# Patient Record
Sex: Male | Born: 1940 | Race: White | Hispanic: No | Marital: Married | Smoking: Former smoker
Health system: Southern US, Community
[De-identification: ages and names within clinical notes are randomized; demographics above are authoritative.]

## PROBLEM LIST (undated history)

## (undated) DIAGNOSIS — I219 Acute myocardial infarction, unspecified: Secondary | ICD-10-CM

## (undated) DIAGNOSIS — K219 Gastro-esophageal reflux disease without esophagitis: Secondary | ICD-10-CM

## (undated) DIAGNOSIS — I2510 Atherosclerotic heart disease of native coronary artery without angina pectoris: Secondary | ICD-10-CM

## (undated) DIAGNOSIS — I10 Essential (primary) hypertension: Secondary | ICD-10-CM

## (undated) DIAGNOSIS — E119 Type 2 diabetes mellitus without complications: Secondary | ICD-10-CM

## (undated) DIAGNOSIS — E785 Hyperlipidemia, unspecified: Secondary | ICD-10-CM

## (undated) DIAGNOSIS — M1990 Unspecified osteoarthritis, unspecified site: Secondary | ICD-10-CM

## (undated) HISTORY — DX: Unspecified osteoarthritis, unspecified site: M19.90

## (undated) HISTORY — DX: Gastro-esophageal reflux disease without esophagitis: K21.9

## (undated) HISTORY — DX: Acute myocardial infarction, unspecified: I21.9

## (undated) HISTORY — DX: Essential (primary) hypertension: I10

## (undated) HISTORY — DX: Atherosclerotic heart disease of native coronary artery without angina pectoris: I25.10

## (undated) HISTORY — PX: FOOT SURGERY: SHX648

## (undated) HISTORY — DX: Type 2 diabetes mellitus without complications: E11.9

## (undated) HISTORY — PX: COLONOSCOPY: SHX174

## (undated) HISTORY — DX: Hyperlipidemia, unspecified: E78.5

---

## 1994-07-16 HISTORY — PX: CORONARY ANGIOPLASTY WITH STENT PLACEMENT: SHX49

## 2004-07-16 HISTORY — PX: CARDIAC CATHETERIZATION: SHX172

## 2004-07-16 HISTORY — PX: CORONARY ANGIOPLASTY WITH STENT PLACEMENT: SHX49

## 2011-10-10 DIAGNOSIS — H6120 Impacted cerumen, unspecified ear: Secondary | ICD-10-CM | POA: Diagnosis not present

## 2011-10-10 DIAGNOSIS — H60339 Swimmer's ear, unspecified ear: Secondary | ICD-10-CM | POA: Diagnosis not present

## 2011-10-10 DIAGNOSIS — H7200 Central perforation of tympanic membrane, unspecified ear: Secondary | ICD-10-CM | POA: Diagnosis not present

## 2011-10-17 DIAGNOSIS — H903 Sensorineural hearing loss, bilateral: Secondary | ICD-10-CM | POA: Diagnosis not present

## 2011-10-17 DIAGNOSIS — H60399 Other infective otitis externa, unspecified ear: Secondary | ICD-10-CM | POA: Diagnosis not present

## 2011-10-17 DIAGNOSIS — H6120 Impacted cerumen, unspecified ear: Secondary | ICD-10-CM | POA: Diagnosis not present

## 2012-01-21 DIAGNOSIS — L919 Hypertrophic disorder of the skin, unspecified: Secondary | ICD-10-CM | POA: Diagnosis not present

## 2012-01-21 DIAGNOSIS — L909 Atrophic disorder of skin, unspecified: Secondary | ICD-10-CM | POA: Diagnosis not present

## 2012-01-21 DIAGNOSIS — D485 Neoplasm of uncertain behavior of skin: Secondary | ICD-10-CM | POA: Diagnosis not present

## 2012-01-21 DIAGNOSIS — L820 Inflamed seborrheic keratosis: Secondary | ICD-10-CM | POA: Diagnosis not present

## 2012-01-21 DIAGNOSIS — Z85828 Personal history of other malignant neoplasm of skin: Secondary | ICD-10-CM | POA: Diagnosis not present

## 2012-01-21 DIAGNOSIS — L570 Actinic keratosis: Secondary | ICD-10-CM | POA: Diagnosis not present

## 2012-01-23 DIAGNOSIS — H903 Sensorineural hearing loss, bilateral: Secondary | ICD-10-CM | POA: Diagnosis not present

## 2012-01-23 DIAGNOSIS — H60399 Other infective otitis externa, unspecified ear: Secondary | ICD-10-CM | POA: Diagnosis not present

## 2012-03-07 DIAGNOSIS — L570 Actinic keratosis: Secondary | ICD-10-CM | POA: Diagnosis not present

## 2012-03-07 DIAGNOSIS — L820 Inflamed seborrheic keratosis: Secondary | ICD-10-CM | POA: Diagnosis not present

## 2012-04-18 DIAGNOSIS — E119 Type 2 diabetes mellitus without complications: Secondary | ICD-10-CM | POA: Diagnosis not present

## 2012-04-18 DIAGNOSIS — Z23 Encounter for immunization: Secondary | ICD-10-CM | POA: Diagnosis not present

## 2012-11-21 DIAGNOSIS — E119 Type 2 diabetes mellitus without complications: Secondary | ICD-10-CM | POA: Diagnosis not present

## 2012-12-31 DIAGNOSIS — H6040 Cholesteatoma of external ear, unspecified ear: Secondary | ICD-10-CM | POA: Diagnosis not present

## 2013-01-02 DIAGNOSIS — E041 Nontoxic single thyroid nodule: Secondary | ICD-10-CM | POA: Diagnosis not present

## 2013-01-07 DIAGNOSIS — N400 Enlarged prostate without lower urinary tract symptoms: Secondary | ICD-10-CM | POA: Diagnosis not present

## 2013-03-23 DIAGNOSIS — L570 Actinic keratosis: Secondary | ICD-10-CM | POA: Diagnosis not present

## 2013-03-23 DIAGNOSIS — Z1283 Encounter for screening for malignant neoplasm of skin: Secondary | ICD-10-CM | POA: Diagnosis not present

## 2013-04-20 DIAGNOSIS — L570 Actinic keratosis: Secondary | ICD-10-CM | POA: Diagnosis not present

## 2013-04-20 DIAGNOSIS — D485 Neoplasm of uncertain behavior of skin: Secondary | ICD-10-CM | POA: Diagnosis not present

## 2013-05-08 DIAGNOSIS — Z79899 Other long term (current) drug therapy: Secondary | ICD-10-CM | POA: Diagnosis not present

## 2013-08-07 DIAGNOSIS — I2510 Atherosclerotic heart disease of native coronary artery without angina pectoris: Secondary | ICD-10-CM | POA: Diagnosis not present

## 2013-08-07 DIAGNOSIS — E785 Hyperlipidemia, unspecified: Secondary | ICD-10-CM | POA: Diagnosis not present

## 2013-08-07 DIAGNOSIS — G4733 Obstructive sleep apnea (adult) (pediatric): Secondary | ICD-10-CM | POA: Diagnosis not present

## 2013-08-07 DIAGNOSIS — M1990 Unspecified osteoarthritis, unspecified site: Secondary | ICD-10-CM | POA: Diagnosis not present

## 2013-08-07 DIAGNOSIS — E119 Type 2 diabetes mellitus without complications: Secondary | ICD-10-CM | POA: Diagnosis not present

## 2013-08-07 DIAGNOSIS — E041 Nontoxic single thyroid nodule: Secondary | ICD-10-CM | POA: Diagnosis not present

## 2013-08-07 DIAGNOSIS — K219 Gastro-esophageal reflux disease without esophagitis: Secondary | ICD-10-CM | POA: Diagnosis not present

## 2013-08-21 DIAGNOSIS — R0989 Other specified symptoms and signs involving the circulatory and respiratory systems: Secondary | ICD-10-CM | POA: Diagnosis not present

## 2013-10-10 DIAGNOSIS — I10 Essential (primary) hypertension: Secondary | ICD-10-CM | POA: Diagnosis not present

## 2013-10-10 DIAGNOSIS — I2510 Atherosclerotic heart disease of native coronary artery without angina pectoris: Secondary | ICD-10-CM | POA: Diagnosis not present

## 2013-10-10 DIAGNOSIS — E119 Type 2 diabetes mellitus without complications: Secondary | ICD-10-CM | POA: Diagnosis not present

## 2013-10-10 DIAGNOSIS — D126 Benign neoplasm of colon, unspecified: Secondary | ICD-10-CM | POA: Diagnosis not present

## 2013-10-16 DIAGNOSIS — Z8679 Personal history of other diseases of the circulatory system: Secondary | ICD-10-CM | POA: Diagnosis not present

## 2013-10-16 DIAGNOSIS — Z86010 Personal history of colonic polyps: Secondary | ICD-10-CM | POA: Diagnosis not present

## 2013-10-16 DIAGNOSIS — Z1211 Encounter for screening for malignant neoplasm of colon: Secondary | ICD-10-CM | POA: Diagnosis not present

## 2013-10-16 DIAGNOSIS — D126 Benign neoplasm of colon, unspecified: Secondary | ICD-10-CM | POA: Diagnosis not present

## 2013-10-16 DIAGNOSIS — K5730 Diverticulosis of large intestine without perforation or abscess without bleeding: Secondary | ICD-10-CM | POA: Diagnosis not present

## 2013-10-30 DIAGNOSIS — E119 Type 2 diabetes mellitus without complications: Secondary | ICD-10-CM | POA: Diagnosis not present

## 2013-10-30 DIAGNOSIS — I10 Essential (primary) hypertension: Secondary | ICD-10-CM | POA: Diagnosis not present

## 2013-10-30 DIAGNOSIS — I2510 Atherosclerotic heart disease of native coronary artery without angina pectoris: Secondary | ICD-10-CM | POA: Diagnosis not present

## 2013-10-30 DIAGNOSIS — E7800 Pure hypercholesterolemia, unspecified: Secondary | ICD-10-CM | POA: Diagnosis not present

## 2013-11-06 DIAGNOSIS — E7800 Pure hypercholesterolemia, unspecified: Secondary | ICD-10-CM | POA: Diagnosis not present

## 2013-11-06 DIAGNOSIS — I2510 Atherosclerotic heart disease of native coronary artery without angina pectoris: Secondary | ICD-10-CM | POA: Diagnosis not present

## 2013-11-06 DIAGNOSIS — K219 Gastro-esophageal reflux disease without esophagitis: Secondary | ICD-10-CM | POA: Diagnosis not present

## 2013-11-06 DIAGNOSIS — E669 Obesity, unspecified: Secondary | ICD-10-CM | POA: Diagnosis not present

## 2013-11-06 DIAGNOSIS — M129 Arthropathy, unspecified: Secondary | ICD-10-CM | POA: Diagnosis not present

## 2013-11-06 DIAGNOSIS — E119 Type 2 diabetes mellitus without complications: Secondary | ICD-10-CM | POA: Diagnosis not present

## 2013-11-06 DIAGNOSIS — E785 Hyperlipidemia, unspecified: Secondary | ICD-10-CM | POA: Diagnosis not present

## 2013-11-06 DIAGNOSIS — I10 Essential (primary) hypertension: Secondary | ICD-10-CM | POA: Diagnosis not present

## 2013-11-06 DIAGNOSIS — D119 Benign neoplasm of major salivary gland, unspecified: Secondary | ICD-10-CM | POA: Diagnosis not present

## 2013-11-11 DIAGNOSIS — D34 Benign neoplasm of thyroid gland: Secondary | ICD-10-CM | POA: Diagnosis not present

## 2013-11-11 DIAGNOSIS — H903 Sensorineural hearing loss, bilateral: Secondary | ICD-10-CM | POA: Diagnosis not present

## 2013-11-11 DIAGNOSIS — D105 Benign neoplasm of other parts of oropharynx: Secondary | ICD-10-CM | POA: Diagnosis not present

## 2013-11-11 DIAGNOSIS — E041 Nontoxic single thyroid nodule: Secondary | ICD-10-CM | POA: Diagnosis not present

## 2013-11-27 DIAGNOSIS — E041 Nontoxic single thyroid nodule: Secondary | ICD-10-CM | POA: Diagnosis not present

## 2013-11-27 DIAGNOSIS — D119 Benign neoplasm of major salivary gland, unspecified: Secondary | ICD-10-CM | POA: Diagnosis not present

## 2013-11-27 DIAGNOSIS — K119 Disease of salivary gland, unspecified: Secondary | ICD-10-CM | POA: Diagnosis not present

## 2013-12-18 DIAGNOSIS — G4733 Obstructive sleep apnea (adult) (pediatric): Secondary | ICD-10-CM | POA: Diagnosis not present

## 2013-12-21 DIAGNOSIS — D34 Benign neoplasm of thyroid gland: Secondary | ICD-10-CM | POA: Diagnosis not present

## 2013-12-21 DIAGNOSIS — D105 Benign neoplasm of other parts of oropharynx: Secondary | ICD-10-CM | POA: Diagnosis not present

## 2013-12-21 DIAGNOSIS — H903 Sensorineural hearing loss, bilateral: Secondary | ICD-10-CM | POA: Diagnosis not present

## 2013-12-21 DIAGNOSIS — E041 Nontoxic single thyroid nodule: Secondary | ICD-10-CM | POA: Diagnosis not present

## 2013-12-21 DIAGNOSIS — H6120 Impacted cerumen, unspecified ear: Secondary | ICD-10-CM | POA: Diagnosis not present

## 2014-01-12 DIAGNOSIS — D235 Other benign neoplasm of skin of trunk: Secondary | ICD-10-CM | POA: Diagnosis not present

## 2014-01-12 DIAGNOSIS — L299 Pruritus, unspecified: Secondary | ICD-10-CM | POA: Diagnosis not present

## 2014-01-12 DIAGNOSIS — Z872 Personal history of diseases of the skin and subcutaneous tissue: Secondary | ICD-10-CM | POA: Diagnosis not present

## 2014-01-12 DIAGNOSIS — L570 Actinic keratosis: Secondary | ICD-10-CM | POA: Diagnosis not present

## 2014-01-12 DIAGNOSIS — D1801 Hemangioma of skin and subcutaneous tissue: Secondary | ICD-10-CM | POA: Diagnosis not present

## 2014-01-12 DIAGNOSIS — L821 Other seborrheic keratosis: Secondary | ICD-10-CM | POA: Diagnosis not present

## 2014-01-12 DIAGNOSIS — D485 Neoplasm of uncertain behavior of skin: Secondary | ICD-10-CM | POA: Diagnosis not present

## 2014-01-26 DIAGNOSIS — L570 Actinic keratosis: Secondary | ICD-10-CM | POA: Diagnosis not present

## 2014-01-26 DIAGNOSIS — D485 Neoplasm of uncertain behavior of skin: Secondary | ICD-10-CM | POA: Diagnosis not present

## 2014-01-26 DIAGNOSIS — L905 Scar conditions and fibrosis of skin: Secondary | ICD-10-CM | POA: Diagnosis not present

## 2014-01-29 DIAGNOSIS — N400 Enlarged prostate without lower urinary tract symptoms: Secondary | ICD-10-CM | POA: Diagnosis not present

## 2014-01-29 DIAGNOSIS — I2510 Atherosclerotic heart disease of native coronary artery without angina pectoris: Secondary | ICD-10-CM | POA: Diagnosis not present

## 2014-01-29 DIAGNOSIS — I10 Essential (primary) hypertension: Secondary | ICD-10-CM | POA: Diagnosis not present

## 2014-01-29 DIAGNOSIS — E7800 Pure hypercholesterolemia, unspecified: Secondary | ICD-10-CM | POA: Diagnosis not present

## 2014-01-29 DIAGNOSIS — E119 Type 2 diabetes mellitus without complications: Secondary | ICD-10-CM | POA: Diagnosis not present

## 2014-01-29 DIAGNOSIS — Z9861 Coronary angioplasty status: Secondary | ICD-10-CM | POA: Diagnosis not present

## 2014-02-01 DIAGNOSIS — Z0000 Encounter for general adult medical examination without abnormal findings: Secondary | ICD-10-CM | POA: Diagnosis not present

## 2014-02-05 DIAGNOSIS — I10 Essential (primary) hypertension: Secondary | ICD-10-CM | POA: Diagnosis not present

## 2014-02-05 DIAGNOSIS — E119 Type 2 diabetes mellitus without complications: Secondary | ICD-10-CM | POA: Diagnosis not present

## 2014-02-05 DIAGNOSIS — E785 Hyperlipidemia, unspecified: Secondary | ICD-10-CM | POA: Diagnosis not present

## 2014-02-24 DIAGNOSIS — L570 Actinic keratosis: Secondary | ICD-10-CM | POA: Diagnosis not present

## 2014-04-12 DIAGNOSIS — Z23 Encounter for immunization: Secondary | ICD-10-CM | POA: Diagnosis not present

## 2014-06-23 DIAGNOSIS — I10 Essential (primary) hypertension: Secondary | ICD-10-CM | POA: Diagnosis not present

## 2014-06-23 DIAGNOSIS — E784 Other hyperlipidemia: Secondary | ICD-10-CM | POA: Diagnosis not present

## 2014-06-23 DIAGNOSIS — E119 Type 2 diabetes mellitus without complications: Secondary | ICD-10-CM | POA: Diagnosis not present

## 2014-06-23 DIAGNOSIS — Z7189 Other specified counseling: Secondary | ICD-10-CM | POA: Diagnosis not present

## 2014-06-23 DIAGNOSIS — K219 Gastro-esophageal reflux disease without esophagitis: Secondary | ICD-10-CM | POA: Diagnosis not present

## 2014-07-23 DIAGNOSIS — K219 Gastro-esophageal reflux disease without esophagitis: Secondary | ICD-10-CM | POA: Diagnosis not present

## 2014-07-23 DIAGNOSIS — I2510 Atherosclerotic heart disease of native coronary artery without angina pectoris: Secondary | ICD-10-CM | POA: Diagnosis not present

## 2014-07-23 DIAGNOSIS — E784 Other hyperlipidemia: Secondary | ICD-10-CM | POA: Diagnosis not present

## 2014-07-23 DIAGNOSIS — E119 Type 2 diabetes mellitus without complications: Secondary | ICD-10-CM | POA: Diagnosis not present

## 2014-07-23 DIAGNOSIS — I10 Essential (primary) hypertension: Secondary | ICD-10-CM | POA: Diagnosis not present

## 2014-08-16 ENCOUNTER — Encounter (INDEPENDENT_AMBULATORY_CARE_PROVIDER_SITE_OTHER): Payer: Self-pay

## 2014-08-16 ENCOUNTER — Encounter: Payer: Self-pay | Admitting: Cardiovascular Disease

## 2014-08-16 ENCOUNTER — Ambulatory Visit (INDEPENDENT_AMBULATORY_CARE_PROVIDER_SITE_OTHER): Payer: Medicare Other | Admitting: Cardiovascular Disease

## 2014-08-16 VITALS — BP 140/62 | HR 61 | Ht 66.0 in | Wt 220.0 lb

## 2014-08-16 DIAGNOSIS — I25759 Atherosclerosis of native coronary artery of transplanted heart with unspecified angina pectoris: Secondary | ICD-10-CM | POA: Diagnosis not present

## 2014-08-16 DIAGNOSIS — E669 Obesity, unspecified: Secondary | ICD-10-CM | POA: Diagnosis not present

## 2014-08-16 DIAGNOSIS — J449 Chronic obstructive pulmonary disease, unspecified: Secondary | ICD-10-CM

## 2014-08-16 DIAGNOSIS — Z87891 Personal history of nicotine dependence: Secondary | ICD-10-CM | POA: Diagnosis not present

## 2014-08-16 DIAGNOSIS — I2510 Atherosclerotic heart disease of native coronary artery without angina pectoris: Secondary | ICD-10-CM

## 2014-08-16 DIAGNOSIS — I10 Essential (primary) hypertension: Secondary | ICD-10-CM | POA: Diagnosis not present

## 2014-08-16 DIAGNOSIS — E785 Hyperlipidemia, unspecified: Secondary | ICD-10-CM | POA: Insufficient documentation

## 2014-08-16 DIAGNOSIS — E118 Type 2 diabetes mellitus with unspecified complications: Secondary | ICD-10-CM | POA: Diagnosis not present

## 2014-08-16 DIAGNOSIS — E1169 Type 2 diabetes mellitus with other specified complication: Secondary | ICD-10-CM | POA: Insufficient documentation

## 2014-08-16 NOTE — Assessment & Plan Note (Signed)
We have encouraged continued exercise, careful diet management in an effort to lose weight. 

## 2014-08-16 NOTE — Assessment & Plan Note (Signed)
Currently with no symptoms of angina. No further workup at this time. Continue current medication regimen. Records have been requested for his PCI in 1996 and 2006

## 2014-08-16 NOTE — Progress Notes (Signed)
Patient ID: Jason Farrell, male    DOB: 04-13-1941, 74 y.o.   MRN: [id number]  HPI Comments: Mr. Thrun is a pleasant 74 year old gentleman who is new to the clinic, who previously lived in [place], history of coronary artery disease, stent 3 in 1996 after MI, stent 2 in 2006 for in-stent restenosis as well as a additional blockage per the patient, prior smoking history of 30-35 years, diabetes who presents with establish care for his coronary disease.  In general he reports that he is doing well. He continues to work on his diet. Hemoglobin A1c 6.5. Previous anginal symptoms included left arm pain. He denies any recent shortness of breath, chest pain or arm pain with exertion. He is tolerating his medications. He stopped smoking in 1996 after his MI . he does report having a stress test 2-3 years ago and was told that it was normal. He does have chronic low back pain. No regular exercise but does stay active.  Lab work provided with him today shows creatinine 0.94, hemoglobin A1c 6.5, total cholesterol 123, LDL 46, HDL 48  EKG on today's visit shows normal sinus rhythm with rate 61 bpm, right bundle branch block  No Known Allergies  Outpatient Encounter Prescriptions as of 08/16/2014  Medication Sig  . amLODipine (NORVASC) 10 MG tablet Take 10 mg by mouth daily.  Marland Kitchen aspirin 81 MG tablet Take 81 mg by mouth daily.  Marland Kitchen atorvastatin (LIPITOR) 40 MG tablet Take 40 mg by mouth daily.  Marland Kitchen docusate sodium (COLACE) 100 MG capsule Take 200 mg by mouth daily.  Marland Kitchen glipiZIDE (GLUCOTROL) 5 MG tablet Take by mouth 2 (two) times daily before a meal.  . hydrochlorothiazide (HYDRODIURIL) 25 MG tablet Take 25 mg by mouth daily.  Marland Kitchen lisinopril (PRINIVIL,ZESTRIL) 10 MG tablet Take 10 mg by mouth 2 (two) times daily.  . metFORMIN (GLUCOPHAGE) 500 MG tablet Take by mouth 2 (two) times daily with a meal.  . metoprolol succinate (TOPROL-XL) 100 MG 24 hr tablet Take 100 mg by mouth daily. Take with or immediately  following a meal.  . Multiple Vitamins-Minerals (CENTRUM SILVER PO) Take by mouth daily.  . Omega 3 340 MG CPDR Take 340 mg by mouth daily.  . Omega-3 Fatty Acids (FISH OIL) 1200 MG CAPS Take 1,200 mg by mouth daily.  Marland Kitchen omeprazole (PRILOSEC) 40 MG capsule Take 40 mg by mouth daily.  . sitaGLIPtin (JANUVIA) 100 MG tablet Take 100 mg by mouth daily.  . Vitamin D, Cholecalciferol, 1000 UNITS CAPS Take 1,000 units of lipase by mouth daily.    Past Medical History  Diagnosis Date  . Coronary artery disease   . Diabetes mellitus without complication   . Hypertension   . Osteoarthritis   . GERD (gastroesophageal reflux disease)   . Dyslipidemia   . Hyperlipidemia   . MI (myocardial infarction)     Past Surgical History  Procedure Laterality Date  . Cardiac catheterization  2006    stent placement  . Coronary angioplasty with stent placement  2006    stent x 2   . Coronary angioplasty with stent placement  1996    stent x 3   . Colonoscopy      Social History  reports that he quit smoking about 19 years ago. His smoking use included Cigarettes. He has a 52.5 pack-year smoking history. He does not have any smokeless tobacco history on file. He reports that he drinks about 1.2 oz of alcohol per week. He reports that  he does not use illicit drugs.  Family History family history includes Heart disease in his sister; Heart failure in his sister; Hyperlipidemia in his father and sister; Hypertension in his father, sister, and sister.    Review of Systems  Constitutional: Negative.   HENT: Negative.   Eyes: Negative.   Respiratory: Negative.   Cardiovascular: Negative.   Gastrointestinal: Negative.   Endocrine: Negative.   Musculoskeletal: Negative.   Skin: Negative.   Allergic/Immunologic: Negative.   Neurological: Negative.   Hematological: Negative.   Psychiatric/Behavioral: Negative.   All other systems reviewed and are negative.   BP 140/62 mmHg  Pulse 61  Ht 5\' 6"   (1.676 m)  Wt 220 lb (99.791 kg)  BMI 35.53 kg/m2  Physical Exam  Constitutional: He is oriented to person, place, and time. He appears well-developed and well-nourished.  HENT:  Head: Normocephalic.  Nose: Nose normal.  Mouth/Throat: Oropharynx is clear and moist.  Eyes: Conjunctivae are normal. Pupils are equal, round, and reactive to light.  Neck: Normal range of motion. Neck supple. No JVD present.  Cardiovascular: Normal rate, regular rhythm, S1 normal, S2 normal, normal heart sounds and intact distal pulses.  Exam reveals no gallop and no friction rub.   No murmur heard. Pulmonary/Chest: Effort normal and breath sounds normal. No respiratory distress. He has no wheezes. He has no rales. He exhibits no tenderness.  Abdominal: Soft. Bowel sounds are normal. He exhibits no distension. There is no tenderness.  Musculoskeletal: Normal range of motion. He exhibits no edema or tenderness.  Lymphadenopathy:    He has no cervical adenopathy.  Neurological: He is alert and oriented to person, place, and time. Coordination normal.  Skin: Skin is warm and dry. No rash noted. No erythema.  Psychiatric: He has a normal mood and affect. His behavior is normal. Judgment and thought content normal.      Assessment and Plan   Nursing note and vitals reviewed.

## 2014-08-16 NOTE — Patient Instructions (Addendum)
You are doing well. No medication changes were made.  Please call us if you have new issues that need to be addressed before your next appt.  Your physician wants you to follow-up in: 6 months.  You will receive a reminder letter in the mail two months in advance. If you don't receive a letter, please call our office to schedule the follow-up appointment.   

## 2014-08-16 NOTE — Assessment & Plan Note (Signed)
Blood pressure is well controlled on today's visit. No changes made to the medications. 

## 2014-08-16 NOTE — Assessment & Plan Note (Signed)
Recommended continued smoking cessation. He stop smoking 20 years ago

## 2014-08-16 NOTE — Assessment & Plan Note (Signed)
30 year smoking history. Stopped in 1996. Free mild chronic shortness of breath with exertion, stable

## 2014-08-16 NOTE — Assessment & Plan Note (Signed)
Cholesterol is at goal on the current lipid regimen. No changes to the medications were made.  

## 2014-10-25 DIAGNOSIS — E785 Hyperlipidemia, unspecified: Secondary | ICD-10-CM | POA: Diagnosis not present

## 2014-10-25 DIAGNOSIS — E119 Type 2 diabetes mellitus without complications: Secondary | ICD-10-CM | POA: Diagnosis not present

## 2014-10-25 DIAGNOSIS — K219 Gastro-esophageal reflux disease without esophagitis: Secondary | ICD-10-CM | POA: Diagnosis not present

## 2014-10-25 DIAGNOSIS — I10 Essential (primary) hypertension: Secondary | ICD-10-CM | POA: Diagnosis not present

## 2014-10-25 DIAGNOSIS — I252 Old myocardial infarction: Secondary | ICD-10-CM | POA: Diagnosis not present

## 2014-10-25 DIAGNOSIS — R2241 Localized swelling, mass and lump, right lower limb: Secondary | ICD-10-CM | POA: Diagnosis not present

## 2014-10-25 DIAGNOSIS — I2510 Atherosclerotic heart disease of native coronary artery without angina pectoris: Secondary | ICD-10-CM | POA: Diagnosis not present

## 2014-11-10 DIAGNOSIS — M79671 Pain in right foot: Secondary | ICD-10-CM | POA: Diagnosis not present

## 2014-11-10 DIAGNOSIS — M2022 Hallux rigidus, left foot: Secondary | ICD-10-CM | POA: Diagnosis not present

## 2014-11-10 DIAGNOSIS — M2021 Hallux rigidus, right foot: Secondary | ICD-10-CM | POA: Diagnosis not present

## 2014-11-12 DIAGNOSIS — Z85828 Personal history of other malignant neoplasm of skin: Secondary | ICD-10-CM | POA: Diagnosis not present

## 2014-11-12 DIAGNOSIS — L570 Actinic keratosis: Secondary | ICD-10-CM | POA: Diagnosis not present

## 2014-11-12 DIAGNOSIS — X32XXXA Exposure to sunlight, initial encounter: Secondary | ICD-10-CM | POA: Diagnosis not present

## 2015-01-25 ENCOUNTER — Encounter: Payer: Self-pay | Admitting: Family Medicine

## 2015-01-25 ENCOUNTER — Ambulatory Visit (INDEPENDENT_AMBULATORY_CARE_PROVIDER_SITE_OTHER): Payer: Medicare Other | Admitting: Family Medicine

## 2015-01-25 VITALS — BP 120/69 | HR 69 | Temp 97.8°F | Resp 18 | Ht 65.0 in | Wt 224.2 lb

## 2015-01-25 DIAGNOSIS — E785 Hyperlipidemia, unspecified: Secondary | ICD-10-CM | POA: Diagnosis not present

## 2015-01-25 DIAGNOSIS — I10 Essential (primary) hypertension: Secondary | ICD-10-CM | POA: Diagnosis not present

## 2015-01-25 DIAGNOSIS — I2510 Atherosclerotic heart disease of native coronary artery without angina pectoris: Secondary | ICD-10-CM | POA: Diagnosis not present

## 2015-01-25 DIAGNOSIS — E118 Type 2 diabetes mellitus with unspecified complications: Secondary | ICD-10-CM

## 2015-01-25 DIAGNOSIS — I25118 Atherosclerotic heart disease of native coronary artery with other forms of angina pectoris: Secondary | ICD-10-CM | POA: Insufficient documentation

## 2015-01-25 NOTE — Progress Notes (Signed)
Name: Jason Farrell   MRN: [id number]    DOB: 07-26-1940   Date:01/25/2015       Progress Note  Subjective  Chief Complaint  Chief Complaint  Patient presents with  . Follow-up    3 mo. Fasting  . Diabetes  . Hypertension  . Hyperlipidemia    Diabetes He presents for his follow-up diabetic visit. He has type 2 diabetes mellitus. His disease course has been stable. Pertinent negatives for hypoglycemia include no headaches. Pertinent negatives for diabetes include no chest pain. Pertinent negatives for diabetic complications include no CVA. Current diabetic treatment includes oral agent (triple therapy). He is following a diabetic and generally healthy diet. His breakfast blood glucose range is generally 90-110 mg/dl. An ACE inhibitor/angiotensin II receptor blocker is being taken.  Hypertension This is a chronic problem. The problem is controlled. Pertinent negatives include no chest pain, headaches, palpitations or shortness of breath. Past treatments include ACE inhibitors, beta blockers, calcium channel blockers and diuretics. Hypertensive end-organ damage includes CAD/MI. There is no history of CVA.  Hyperlipidemia This is a chronic problem. Recent lipid tests were reviewed and are normal. Pertinent negatives include no chest pain, leg pain, myalgias or shortness of breath. Current antihyperlipidemic treatment includes statins.      Past Medical History  Diagnosis Date  . Coronary artery disease   . Diabetes mellitus without complication   . Hypertension   . Osteoarthritis   . GERD (gastroesophageal reflux disease)   . Dyslipidemia   . Hyperlipidemia   . MI (myocardial infarction)     Past Surgical History  Procedure Laterality Date  . Cardiac catheterization  2006    stent placement  . Coronary angioplasty with stent placement  2006    stent x 2   . Coronary angioplasty with stent placement  1996    stent x 3   . Colonoscopy      Family History  Problem Relation  Age of Onset  . Hyperlipidemia Father   . Hypertension Father   . Heart disease Sister   . Heart failure Sister   . Hypertension Sister   . Hyperlipidemia Sister   . Hypertension Sister     History   Social History  . Marital Status: Married    Spouse Name: N/A  . Number of Children: N/A  . Years of Education: N/A   Occupational History  . Not on file.   Social History Main Topics  . Smoking status: Former Smoker -- 1.50 packs/day for 35 years    Types: Cigarettes    Quit date: 08/27/1994  . Smokeless tobacco: Not on file  . Alcohol Use: 1.2 oz/week    2 Glasses of wine per week  . Drug Use: No  . Sexual Activity: Not on file   Other Topics Concern  . Not on file   Social History Narrative     Current outpatient prescriptions:  .  amLODipine (NORVASC) 10 MG tablet, Take 10 mg by mouth daily., Disp: , Rfl:  .  aspirin 81 MG tablet, Take 81 mg by mouth daily., Disp: , Rfl:  .  atorvastatin (LIPITOR) 40 MG tablet, Take 40 mg by mouth daily., Disp: , Rfl:  .  Coenzyme Q10 (CO Q10) 200 MG CAPS, Take 1 capsule by mouth daily., Disp: , Rfl:  .  docusate sodium (COLACE) 100 MG capsule, Take 200 mg by mouth daily., Disp: , Rfl:  .  glipiZIDE (GLUCOTROL) 5 MG tablet, Take by mouth 2 (two) times daily  before a meal., Disp: , Rfl:  .  hydrochlorothiazide (HYDRODIURIL) 25 MG tablet, Take 25 mg by mouth daily., Disp: , Rfl:  .  lisinopril (PRINIVIL,ZESTRIL) 20 MG tablet, Take 1 tablet by mouth 2 (two) times daily., Disp: , Rfl:  .  metFORMIN (GLUCOPHAGE) 500 MG tablet, Take by mouth 2 (two) times daily with a meal., Disp: , Rfl:  .  metoprolol succinate (TOPROL-XL) 100 MG 24 hr tablet, Take 100 mg by mouth daily. Take with or immediately following a meal., Disp: , Rfl:  .  Multiple Vitamins-Minerals (CENTRUM SILVER PO), Take by mouth daily., Disp: , Rfl:  .  Omega 3 340 MG CPDR, Take 340 mg by mouth daily., Disp: , Rfl:  .  Omega-3 Fatty Acids (FISH OIL) 1200 MG CAPS, Take 1,200  mg by mouth daily., Disp: , Rfl:  .  omeprazole (PRILOSEC) 40 MG capsule, Take 40 mg by mouth daily., Disp: , Rfl:  .  sitaGLIPtin (JANUVIA) 100 MG tablet, Take 100 mg by mouth daily., Disp: , Rfl:  .  sitaGLIPtin (JANUVIA) 100 MG tablet, Take 1 tablet by mouth daily., Disp: , Rfl:  .  Vitamin D, Cholecalciferol, 1000 UNITS CAPS, Take 1,000 units of lipase by mouth daily., Disp: , Rfl:   No Known Allergies   Review of Systems  Respiratory: Negative for shortness of breath.   Cardiovascular: Negative for chest pain and palpitations.  Musculoskeletal: Negative for myalgias.  Neurological: Negative for headaches.      Objective  Filed Vitals:   01/25/15 0757  BP: 120/69  Pulse: 69  Temp: 97.8 F (36.6 C)  TempSrc: Oral  Resp: 18  Height: 5\' 5"  (1.651 m)  Weight: 224 lb 3.2 oz (101.696 kg)  SpO2: 98%    Physical Exam  Constitutional: He is oriented to person, place, and time and well-developed, well-nourished, and in no distress.  Cardiovascular: Normal rate and regular rhythm.   Pulmonary/Chest: Effort normal and breath sounds normal.  Neurological: He is alert and oriented to person, place, and time.  Skin: Skin is warm and dry.  Nursing note and vitals reviewed.    Assessment & Plan 1. Essential hypertension Blood pressure is at goal. Continue current therapy. - Comprehensive metabolic panel  2. Diabetes mellitus type 2, controlled, with complications I was unable to palpate the posterior tibialis pulse today. Patient otherwise has normal sensation to his feet. Recheck in 3 months. - HgB A1c  3. Hyperlipidemia Cholesterol is well controlled. Continue current therapy. - Cholesterol, Total    Latravis Grine [facility] 01/25/2015 8:22 AM

## 2015-01-26 LAB — COMPREHENSIVE METABOLIC PANEL
ALBUMIN: 4.2 g/dL (ref 3.5–4.8)
ALK PHOS: 66 IU/L (ref 39–117)
ALT: 13 IU/L (ref 0–44)
AST: 17 IU/L (ref 0–40)
Albumin/Globulin Ratio: 1.6 (ref 1.1–2.5)
BUN/Creatinine Ratio: 16 (ref 10–22)
BUN: 16 mg/dL (ref 8–27)
Bilirubin Total: 0.3 mg/dL (ref 0.0–1.2)
CALCIUM: 9.1 mg/dL (ref 8.6–10.2)
CO2: 26 mmol/L (ref 18–29)
CREATININE: 0.98 mg/dL (ref 0.76–1.27)
Chloride: 98 mmol/L (ref 97–108)
GFR calc Af Amer: 89 mL/min/{1.73_m2} (ref >59)
GFR, EST NON AFRICAN AMERICAN: 77 mL/min/{1.73_m2} (ref >59)
GLOBULIN, TOTAL: 2.7 g/dL (ref 1.5–4.5)
GLUCOSE: 133 mg/dL — AB (ref 65–99)
POTASSIUM: 4.6 mmol/L (ref 3.5–5.2)
Sodium: 141 mmol/L (ref 134–144)
Total Protein: 6.9 g/dL (ref 6.0–8.5)

## 2015-01-26 LAB — CHOLESTEROL, TOTAL: CHOLESTEROL TOTAL: 127 mg/dL (ref 100–199)

## 2015-01-26 LAB — HEMOGLOBIN A1C: Hgb A1c MFr Bld: 6.4 % — ABNORMAL HIGH (ref 4.8–5.6)

## 2015-01-29 LAB — LIPID PANEL W/O CHOL/HDL RATIO
Cholesterol, Total: 132 mg/dL (ref 100–199)
HDL: 54 mg/dL (ref >39)
LDL CALC: 58 mg/dL (ref 0–99)
Triglycerides: 100 mg/dL (ref 0–149)
VLDL Cholesterol Cal: 20 mg/dL (ref 5–40)

## 2015-02-05 LAB — SPECIMEN STATUS REPORT

## 2015-02-11 ENCOUNTER — Encounter: Payer: Self-pay | Admitting: Cardiovascular Disease

## 2015-02-11 ENCOUNTER — Ambulatory Visit (INDEPENDENT_AMBULATORY_CARE_PROVIDER_SITE_OTHER): Payer: Medicare Other | Admitting: Cardiovascular Disease

## 2015-02-11 VITALS — BP 130/72 | HR 63 | Ht 66.0 in | Wt 220.5 lb

## 2015-02-11 DIAGNOSIS — I10 Essential (primary) hypertension: Secondary | ICD-10-CM | POA: Diagnosis not present

## 2015-02-11 DIAGNOSIS — E119 Type 2 diabetes mellitus without complications: Secondary | ICD-10-CM | POA: Diagnosis not present

## 2015-02-11 DIAGNOSIS — I2510 Atherosclerotic heart disease of native coronary artery without angina pectoris: Secondary | ICD-10-CM | POA: Diagnosis not present

## 2015-02-11 DIAGNOSIS — J449 Chronic obstructive pulmonary disease, unspecified: Secondary | ICD-10-CM

## 2015-02-11 DIAGNOSIS — E669 Obesity, unspecified: Secondary | ICD-10-CM

## 2015-02-11 DIAGNOSIS — E785 Hyperlipidemia, unspecified: Secondary | ICD-10-CM

## 2015-02-11 NOTE — Assessment & Plan Note (Signed)
Blood pressure is well controlled on today's visit. No changes made to the medications. 

## 2015-02-11 NOTE — Assessment & Plan Note (Signed)
Encouraged regular exercise program for weight loss and conditioning

## 2015-02-11 NOTE — Patient Instructions (Addendum)
You are doing well. No medication changes were made.  Please call us if you have new issues that need to be addressed before your next appt.  Your physician wants you to follow-up in: 6 months.  You will receive a reminder letter in the mail two months in advance. If you don't receive a letter, please call our office to schedule the follow-up appointment.   

## 2015-02-11 NOTE — Assessment & Plan Note (Signed)
Currently with no symptoms of angina. No further workup at this time. Continue current medication regimen. 

## 2015-02-11 NOTE — Assessment & Plan Note (Signed)
We have encouraged continued exercise, careful diet management in an effort to lose weight. Hemoglobin A1c 6.4

## 2015-02-11 NOTE — Assessment & Plan Note (Signed)
Stop smoking in 1996. Respiratory status has been relatively stable No recent COPD exacerbation

## 2015-02-11 NOTE — Assessment & Plan Note (Signed)
Cholesterol is at goal on the current lipid regimen. No changes to the medications were made.  

## 2015-02-11 NOTE — Progress Notes (Signed)
Patient ID: Jason Farrell, male    DOB: 1941/04/27, 74 y.o.   MRN: [id number]     Patient ID: Jason Farrell, male    DOB: 11/30/1940, 74 y.o.   MRN: [id number]  HPI Comments:  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  In follow-up today, he reports that he is doing well. He is active, recently push mowed with no symptoms of angina. Previous anginal symptoms included arm pain. None recently He does have chronic low back pain. No regular exercise but does stay active. No new symptoms  He is scheduled to have foot surgery with Dr. [name] 02/25/2015. Preoperative evaluation 02/16/2015  Recent lab work reviewed with him including hemoglobin A1c 6.4, total cholesterol under excellent control on Lipitor   EKG on today's visit shows normal sinus rhythm with rate 63 bpm, right bundle branch block  Other past medical history He stopped smoking in 1996 after his MI .   No Known Allergies  Current Outpatient Prescriptions on File Prior to Visit  Medication Sig Dispense Refill  . amLODipine (NORVASC) 10 MG tablet Take 10 mg by mouth daily.    Marland Kitchen aspirin 81 MG tablet Take 81 mg by mouth daily.    Marland Kitchen atorvastatin (LIPITOR) 40 MG tablet Take 40 mg by mouth daily.    . Coenzyme Q10 (CO Q10) 200 MG CAPS Take 1 capsule by mouth daily.    Marland Kitchen docusate sodium (COLACE) 100 MG capsule Take 200 mg by mouth daily.    Marland Kitchen glipiZIDE (GLUCOTROL) 5 MG tablet Take 1 tablet by mouth daily.    . hydrochlorothiazide (HYDRODIURIL) 25 MG tablet Take 25 mg by mouth daily.    Marland Kitchen lisinopril (PRINIVIL,ZESTRIL) 20 MG tablet Take 1 tablet by mouth 2 (two) times daily.    . metFORMIN (GLUCOPHAGE) 500 MG tablet Take by mouth 2 (two) times daily with a meal.    . metoprolol succinate (TOPROL-XL) 100  MG 24 hr tablet Take 100 mg by mouth daily. Take with or immediately following a meal.    . Multiple Vitamins-Minerals (CENTRUM SILVER PO) Take by mouth daily.    . Omega-3 Fatty Acids (FISH OIL) 1200 MG CAPS Take 1,200 mg by mouth daily.    Marland Kitchen omeprazole (PRILOSEC) 40 MG capsule Take 40 mg by mouth daily.    . sitaGLIPtin (JANUVIA) 100 MG tablet Take 100 mg by mouth daily.    . Vitamin D, Cholecalciferol, 1000 UNITS CAPS Take 1,000 units of lipase by mouth daily.     No current facility-administered medications on file prior to visit.    Past Medical History  Diagnosis Date  . Coronary artery disease   . Diabetes mellitus without complication   . Hypertension   . Osteoarthritis   . GERD (gastroesophageal reflux disease)   . Dyslipidemia   . Hyperlipidemia   . MI (myocardial infarction)     Past Surgical History  Procedure Laterality Date  . Cardiac catheterization  2006    stent placement  . Coronary angioplasty with stent placement  2006    stent x 2   . Coronary angioplasty with stent placement  1996    stent x 3   . Colonoscopy      Social History  reports that he quit smoking about 20 years ago.  His smoking use included Cigarettes. He has a 52.5 pack-year smoking history. He does not have any smokeless tobacco history on file. He reports that he drinks about 1.2 oz of alcohol per week. He reports that he does not use illicit drugs.  Family History family history includes Heart disease in his sister; Heart failure in his sister; Hyperlipidemia in his father and sister; Hypertension in his father, sister, and sister.   Review of Systems  Constitutional: Negative.   Respiratory: Negative.   Cardiovascular: Negative.   Gastrointestinal: Negative.   Musculoskeletal: Negative.        Foot pain  Neurological: Negative.   Hematological: Negative.   Psychiatric/Behavioral: Negative.   All other systems reviewed and are negative.  BP 130/72 mmHg  Pulse 63  Ht 5\' 6"   (1.676 m)  Wt 220 lb 8 oz (100.018 kg)  BMI 35.61 kg/m2   Physical Exam  Constitutional: He is oriented to person, place, and time. He appears well-developed and well-nourished.  Obese  HENT:  Head: Normocephalic.  Nose: Nose normal.  Mouth/Throat: Oropharynx is clear and moist.  Eyes: Conjunctivae are normal.  Neck: Normal range of motion. Neck supple. No JVD present.  Cardiovascular: Normal rate, regular rhythm, S1 normal, S2 normal, normal heart sounds and intact distal pulses.  Exam reveals no gallop and no friction rub.   No murmur heard. Pulmonary/Chest: Effort normal and breath sounds normal. No respiratory distress. He has no wheezes. He has no rales. He exhibits no tenderness.  Abdominal: Soft. Bowel sounds are normal. He exhibits no distension. There is no tenderness.  Musculoskeletal: Normal range of motion. He exhibits no edema or tenderness.  Lymphadenopathy:    He has no cervical adenopathy.  Neurological: He is alert and oriented to person, place, and time. Coordination normal.  Skin: Skin is warm and dry. No rash noted. No erythema.  Psychiatric: He has a normal mood and affect. His behavior is normal. Judgment and thought content normal.      Assessment and Plan   Nursing note and vitals reviewed.

## 2015-02-16 ENCOUNTER — Encounter
Admission: RE | Admit: 2015-02-16 | Discharge: 2015-02-16 | Disposition: A | Discharge status: Home / Self Care | Payer: Medicare Other | Source: Ambulatory Visit | Attending: Podiatry | Admitting: Podiatry

## 2015-02-16 DIAGNOSIS — Z01818 Encounter for other preprocedural examination: Secondary | ICD-10-CM | POA: Diagnosis not present

## 2015-02-16 DIAGNOSIS — M2021 Hallux rigidus, right foot: Secondary | ICD-10-CM | POA: Diagnosis not present

## 2015-02-16 DIAGNOSIS — M2022 Hallux rigidus, left foot: Secondary | ICD-10-CM | POA: Diagnosis not present

## 2015-02-16 DIAGNOSIS — M898X9 Other specified disorders of bone, unspecified site: Secondary | ICD-10-CM | POA: Diagnosis not present

## 2015-02-16 NOTE — Patient Instructions (Signed)
  Your procedure is scheduled on: Friday 02/25/2015  Report to Day Surgery. 2ND FLOOR MEDICAL MALL ENTRANCE To find out your arrival time please call [phone] between 1PM - 3PM on Thursday 02/24/2015.  Remember: Instructions that are not followed completely may result in serious medical risk, up to and including death, or upon the discretion of your surgeon and anesthesiologist your surgery may need to be rescheduled.    __X__ 1. Do not eat food or drink liquids after midnight. No gum chewing or hard candies.     __X__ 2. No Alcohol for 24 hours before or after surgery.   ____ 3. Bring all medications with you on the day of surgery if instructed.    __X__ 4. Notify your doctor if there is any change in your medical condition     (cold, fever, infections).     Do not wear jewelry, make-up, hairpins, clips or nail polish.  Do not wear lotions, powders, or perfumes. You may wear deodorant.  Do not shave 48 hours prior to surgery. Men may shave face and neck.  Do not bring valuables to the hospital.    [facility] is not responsible for any belongings or valuables.               Contacts, dentures or bridgework may not be worn into surgery.  Leave your suitcase in the car. After surgery it may be brought to your room.  For patients admitted to the hospital, discharge time is determined by your                treatment team.   Patients discharged the day of surgery will not be allowed to drive home.   Please read over the following fact sheets that you were given:   Surgical Site Infection Prevention   __X_ Take these medicines the morning of surgery with A SIP OF WATER:    1. LISINOPRIL  2. METOPROLOL  3. OMEPRAZOLE  4.  5.  6.  ____ Fleet Enema (as directed)   __X__ Use CHG Soap as directed  ____ Use inhalers on the day of surgery  __X__ Stop metformin 2 days prior to surgery    ____ Take 1/2 of usual insulin dose the night before surgery and none on the morning of  surgery.   __X__ Stop Coumadin/Plavix/aspirin on AS INSTRUCTED BY YOUR CARDIOLOGIST  ____ Stop Anti-inflammatories on   __X__ Stop supplements until after surgery.  COENZYME Q10, FISH OIL  __X__ Bring C-Pap to the hospital.

## 2015-02-17 ENCOUNTER — Encounter: Payer: Self-pay | Admitting: Cardiovascular Disease

## 2015-02-25 ENCOUNTER — Ambulatory Visit: Payer: Medicare Other | Admitting: Anesthesiology

## 2015-02-25 ENCOUNTER — Ambulatory Visit
Admission: RE | Admit: 2015-02-25 | Discharge: 2015-02-25 | Disposition: A | Discharge status: Home / Self Care | Payer: Medicare Other | Source: Ambulatory Visit | Attending: Podiatry | Admitting: Podiatry

## 2015-02-25 ENCOUNTER — Encounter
Admission: RE | Disposition: A | Discharge status: Home / Self Care | Payer: Self-pay | Source: Ambulatory Visit | Attending: Podiatry

## 2015-02-25 DIAGNOSIS — M2021 Hallux rigidus, right foot: Secondary | ICD-10-CM | POA: Diagnosis not present

## 2015-02-25 DIAGNOSIS — M19071 Primary osteoarthritis, right ankle and foot: Secondary | ICD-10-CM | POA: Insufficient documentation

## 2015-02-25 DIAGNOSIS — E119 Type 2 diabetes mellitus without complications: Secondary | ICD-10-CM | POA: Insufficient documentation

## 2015-02-25 DIAGNOSIS — K219 Gastro-esophageal reflux disease without esophagitis: Secondary | ICD-10-CM | POA: Insufficient documentation

## 2015-02-25 DIAGNOSIS — Z87891 Personal history of nicotine dependence: Secondary | ICD-10-CM | POA: Diagnosis not present

## 2015-02-25 DIAGNOSIS — K449 Diaphragmatic hernia without obstruction or gangrene: Secondary | ICD-10-CM | POA: Diagnosis not present

## 2015-02-25 DIAGNOSIS — G4730 Sleep apnea, unspecified: Secondary | ICD-10-CM | POA: Insufficient documentation

## 2015-02-25 DIAGNOSIS — I252 Old myocardial infarction: Secondary | ICD-10-CM | POA: Diagnosis not present

## 2015-02-25 DIAGNOSIS — G8929 Other chronic pain: Secondary | ICD-10-CM | POA: Diagnosis present

## 2015-02-25 DIAGNOSIS — I10 Essential (primary) hypertension: Secondary | ICD-10-CM | POA: Diagnosis not present

## 2015-02-25 DIAGNOSIS — Z9889 Other specified postprocedural states: Secondary | ICD-10-CM

## 2015-02-25 DIAGNOSIS — Z955 Presence of coronary angioplasty implant and graft: Secondary | ICD-10-CM | POA: Insufficient documentation

## 2015-02-25 HISTORY — PX: HALLUX VALGUS CHEILECTOMY: SHX6625

## 2015-02-25 LAB — GLUCOSE, CAPILLARY: GLUCOSE-CAPILLARY: 162 mg/dL — AB (ref 65–99)

## 2015-02-25 SURGERY — CHEILECTOMY, GREAT TOE, WITH IMPLANT INSERTION
Anesthesia: General | Site: Foot | Laterality: Right | Wound class: Clean

## 2015-02-25 MED ORDER — HYDROCODONE-ACETAMINOPHEN 7.5-325 MG PO TABS: 1.0000 | ORAL_TABLET | Freq: Four times a day (QID) | ORAL | Status: DC | PRN

## 2015-02-25 MED ORDER — FENTANYL CITRATE (PF) 100 MCG/2ML IJ SOLN: 25.0000 ug | INTRAMUSCULAR | Status: DC | PRN

## 2015-02-25 MED ORDER — CEFAZOLIN SODIUM-DEXTROSE 2-3 GM-% IV SOLR
INTRAVENOUS | Status: AC
  Filled 2015-02-25: qty 50

## 2015-02-25 MED ORDER — HYDROCODONE-ACETAMINOPHEN 7.5-325 MG PO TABS
1.0000 | ORAL_TABLET | Freq: Four times a day (QID) | ORAL | Status: DC | PRN
  Administered 2015-02-25: 1 via ORAL

## 2015-02-25 MED ORDER — HYDROCODONE-ACETAMINOPHEN 7.5-325 MG PO TABS
ORAL_TABLET | ORAL | Status: AC
  Filled 2015-02-25: qty 1

## 2015-02-25 MED ORDER — CEFAZOLIN SODIUM-DEXTROSE 2-3 GM-% IV SOLR
2.0000 g | Freq: Once | INTRAVENOUS | Status: AC
  Administered 2015-02-25: 2 g via INTRAVENOUS

## 2015-02-25 MED ORDER — BUPIVACAINE HCL 0.5 % IJ SOLN
INTRAMUSCULAR | Status: DC | PRN
  Administered 2015-02-25: 6 mL

## 2015-02-25 MED ORDER — BUPIVACAINE HCL (PF) 0.5 % IJ SOLN
INTRAMUSCULAR | Status: AC
  Filled 2015-02-25: qty 30

## 2015-02-25 MED ORDER — PROPOFOL 10 MG/ML IV BOLUS
INTRAVENOUS | Status: DC | PRN
  Administered 2015-02-25: 20 mg via INTRAVENOUS
  Administered 2015-02-25 (×2): 30 mg via INTRAVENOUS

## 2015-02-25 MED ORDER — MIDAZOLAM HCL 2 MG/2ML IJ SOLN
INTRAMUSCULAR | Status: DC | PRN
  Administered 2015-02-25: 2 mg via INTRAVENOUS

## 2015-02-25 MED ORDER — LIDOCAINE HCL (PF) 1 % IJ SOLN
INTRAMUSCULAR | Status: AC
  Filled 2015-02-25: qty 30

## 2015-02-25 MED ORDER — LIDOCAINE HCL (CARDIAC) 20 MG/ML IV SOLN
INTRAVENOUS | Status: DC | PRN
  Administered 2015-02-25: 60 mg via INTRAVENOUS

## 2015-02-25 MED ORDER — SODIUM CHLORIDE 0.9 % IV SOLN
INTRAVENOUS | Status: DC
  Administered 2015-02-25 (×2): via INTRAVENOUS

## 2015-02-25 MED ORDER — ONDANSETRON HCL 4 MG/2ML IJ SOLN: 4.0000 mg | Freq: Once | INTRAMUSCULAR | Status: DC | PRN

## 2015-02-25 MED ORDER — PROPOFOL INFUSION 10 MG/ML OPTIME
INTRAVENOUS | Status: DC | PRN
  Administered 2015-02-25: 70 ug/kg/min via INTRAVENOUS

## 2015-02-25 SURGICAL SUPPLY — 45 items
BAG COUNTER SPONGE EZ (MISCELLANEOUS) ×2 IMPLANT
BANDAGE ELASTIC 4 CLIP NS LF (GAUZE/BANDAGES/DRESSINGS) ×3 IMPLANT
BANDAGE STRETCH 3X4.1 STRL (GAUZE/BANDAGES/DRESSINGS) ×3 IMPLANT
BENZOIN TINCTURE PRP APPL 2/3 (GAUZE/BANDAGES/DRESSINGS) ×3 IMPLANT
BIT DRILL 67X1.5XWRPS STRL (BIT) ×1 IMPLANT
BIT DRL 67X1.5XWRPS STRL (BIT) ×1
BLADE OSC/SAGITTAL MD 9X18.5 (BLADE) ×3 IMPLANT
BLADE SURG 15 STRL LF DISP TIS (BLADE) ×2 IMPLANT
BLADE SURG 15 STRL SS (BLADE) ×4
BLADE SURG MINI STRL (BLADE) ×3 IMPLANT
BNDG ESMARK 4X12 TAN STRL LF (GAUZE/BANDAGES/DRESSINGS) ×3 IMPLANT
BNDG GAUZE 4.5X4.1 6PLY STRL (MISCELLANEOUS) ×3 IMPLANT
CANISTER SUCT 1200ML W/VALVE (MISCELLANEOUS) ×3 IMPLANT
CLOSURE WOUND 1/4X4 (GAUZE/BANDAGES/DRESSINGS) ×1
COUNTER SPONGE BAG EZ (MISCELLANEOUS) ×1
COVER PIN YLW 0.028-062 (MISCELLANEOUS) ×3 IMPLANT
DRAPE FLUOR MINI C-ARM 54X84 (DRAPES) ×3 IMPLANT
DRILL BIT WIRE PASS (BIT) ×2
DURAPREP 26ML APPLICATOR (WOUND CARE) ×3 IMPLANT
GAUZE PETRO XEROFOAM 1X8 (MISCELLANEOUS) ×3 IMPLANT
GAUZE SPONGE 4X4 12PLY STRL (GAUZE/BANDAGES/DRESSINGS) ×3 IMPLANT
GLOVE BIO SURGEON STRL SZ8 (GLOVE) ×3 IMPLANT
GLOVE INDICATOR 7.5 STRL GRN (GLOVE) ×3 IMPLANT
GOWN STRL REUS W/ TWL LRG LVL3 (GOWN DISPOSABLE) ×2 IMPLANT
GOWN STRL REUS W/TWL LRG LVL3 (GOWN DISPOSABLE) ×4
KIT RM TURNOVER STRD PROC AR (KITS) ×3 IMPLANT
LABEL OR SOLS (LABEL) ×3 IMPLANT
NDL SAFETY 25GX1.5 (NEEDLE) ×9 IMPLANT
NEEDLE FILTER BLUNT 18X 1/2SAF (NEEDLE) ×2
NEEDLE FILTER BLUNT 18X1 1/2 (NEEDLE) ×1 IMPLANT
NS IRRIG 500ML POUR BTL (IV SOLUTION) ×3 IMPLANT
PACK EXTREMITY ARMC (MISCELLANEOUS) ×3 IMPLANT
PAD GROUND ADULT SPLIT (MISCELLANEOUS) ×3 IMPLANT
PENCIL ELECTRO HAND CTR (MISCELLANEOUS) ×3 IMPLANT
RASP SM TEAR CROSS CUT (RASP) ×3 IMPLANT
SPLINT FAST PLASTER 5X30 (CAST SUPPLIES) ×2
SPLINT PLASTER CAST FAST 5X30 (CAST SUPPLIES) ×1 IMPLANT
STOCKINETTE STRL 6IN 960660 (GAUZE/BANDAGES/DRESSINGS) ×3 IMPLANT
STRIP CLOSURE SKIN 1/4X4 (GAUZE/BANDAGES/DRESSINGS) ×2 IMPLANT
SUT VIC AB 3-0 SH 27 (SUTURE) ×2
SUT VIC AB 3-0 SH 27X BRD (SUTURE) ×1 IMPLANT
SUT VIC AB 4-0 FS2 27 (SUTURE) ×3 IMPLANT
SYRINGE 10CC LL (SYRINGE) ×3 IMPLANT
WIRE Z .045 C-WIRE SPADE TIP (WIRE) ×3 IMPLANT
WIRE Z .062 C-WIRE SPADE TIP (WIRE) IMPLANT

## 2015-02-25 NOTE — Transfer of Care (Signed)
Immediate Anesthesia Transfer of Care Note  Patient: Jason Farrell  Procedure(s) Performed: Procedure(s): HALLUX VALGUS CHEILECTOMY (Right)  Patient Location: PACU  Anesthesia Type:General  Level of Consciousness: awake  Airway & Oxygen Therapy: Patient Spontanous Breathing  Post-op Assessment: Report given to RN  Post vital signs: stable  Last Vitals:  Filed Vitals:   02/25/15 0834  BP: 113/72  Pulse: 58  Temp: 36.6 C  Resp: 7   Past Medical History  Diagnosis Date  . Coronary artery disease   . Diabetes mellitus without complication   . Hypertension   . Osteoarthritis   . GERD (gastroesophageal reflux disease)   . Dyslipidemia   . Hyperlipidemia   . MI (myocardial infarction)    Past Surgical History  Procedure Laterality Date  . Cardiac catheterization  2006    stent placement  . Coronary angioplasty with stent placement  2006    stent x 2   . Coronary angioplasty with stent placement  1996    stent x 3   . Colonoscopy     Scheduled Meds: . ceFAZolin       Continuous Infusions: . sodium chloride 75 mL/hr at 02/25/15 0632   PRN Meds:.fentaNYL (SUBLIMAZE) injection, ondansetron (ZOFRAN) IV  Complications: No apparent anesthesia complications

## 2015-02-25 NOTE — H&P (Signed)
  H&P reviewed and no changes from previous exam.

## 2015-02-25 NOTE — Op Note (Signed)
Operative note    Surgeon: Dr. [name], DPM.    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 Cheilectomy first metatarsophalangeal joint           EBL: Less than 10 cc    Anesthesia:IV sedation and MAC with local block    Hemostasis: Ankle tourniquet 250 mmHg pressure    Specimen: Degenerative bone and cartilage from the first metatarsal phalangeal joint both metatarsal head and proximal phalanx    Complications: None    Operative indications: Chronic pain and deformity    Procedure:  Patient was brought into the OR and placed on the operating table in thesupine position. After anesthesia was obtained theright lower extremity was prepped and draped in usual sterile fashion.  Operative Report: At this time to his directed to the dorsum of the right first metatarsophalangeal joint where a 4 /skin incision was made and deepened with sharp blunt dissection. Bleeders clamped and bovied as required. Capsular tissue was identified this time and incised longitudinally extensive bony proliferation was noted on the first metatarsophalangeal joint and the capsule tissue was dissected away from this dorsally mediolaterally. Several osteophytes were removed and sent for pathology. Her lytic bone formation was noted on the dorsum of the first metatarsal head and the proximal phalanx as well as medially and laterally on the metatarsal head and proximal phalanx as well. A combination of rongeur, rasping, and power sagittal saw were used to remove and reduce the excessive bone formation. Once appropriate amount of bone was removed from each aspect of the joint there is checked FluoroScan good position and correction were noted. At this point a 1.5 drill bit was used to drill into the subchondral bone on both metatarsal head and the proximal phalanx to promote bleeding and  fibrocartilage formation. No hyalin cartilage remained in the joint. This time there was copiously irrigated excessive capsular and synovial tissue was removed and dissected away. Capsular tissues and closed with 4 Vicryl in a continuous stitch as were deep superficial fascial layers. Skin was closed with 4 Vicryl subcuticular stitch. There was an block 0.5% Marcaine plain and a sterile compressive dressing was placed across wound consisting of 4 x 4's conformation Kerlix and an Ace wrap.    Patient tolerated the procedure and anesthesia well.  Was transported from the OR to the PACU with all vital signs stable and vascular status intact. To be discharged per routine protocol.  Will follow up in approximately 1 week in the outpatient clinic.

## 2015-02-25 NOTE — Anesthesia Postprocedure Evaluation (Signed)
  Anesthesia Post-op Note  Patient: Jason Farrell  Procedure(s) Performed: Procedure(s): HALLUX VALGUS CHEILECTOMY (Right)  Anesthesia type:General  Patient location: PACU  Post pain: Pain level controlled  Post assessment: Post-op Vital signs reviewed, Patient's Cardiovascular Status Stable, Respiratory Function Stable, Patent Airway and No signs of Nausea or vomiting  Post vital signs: Reviewed and stable  Last Vitals:  Filed Vitals:   02/25/15 0834  BP: 113/72  Pulse: 58  Temp: 36.6 C  Resp: 7    Level of consciousness: awake, alert  and patient cooperative  Complications: No apparent anesthesia complications

## 2015-02-25 NOTE — Discharge Instructions (Addendum)
[facility] °[facility] ° °POST OPERATIVE INSTRUCTIONS FOR DR. TROXLER AND DR. FOWLER °[facility] ° ° °1. Take your medication as prescribed.  Pain medication should be taken only as needed. ° °2. Keep the dressing clean, dry and intact. ° °3. Keep your foot elevated above the heart level for the first 48 hours. ° °4. Walking to the bathroom and brief periods of walking are acceptable, unless we have instructed you to be non-weight bearing. ° °5. Always wear your post-op shoe when walking.  Always use your crutches if you are to be non-weight bearing. ° °6. Do not take a shower. Baths are permissible as long as the foot is kept out of the water.  ° °7. Every hour you are awake:  °- Bend your knee 15 times. °- Flex foot 15 times °- Massage calf 15 times ° °8. Call [facility] ([phone]) if any of the following problems occur: °- You develop a temperature or fever. °- The bandage becomes saturated with blood. °- Medication does not stop your pain. °- Injury of the foot occurs. °- Any symptoms of infection including redness, odor, or red streaks running from wound. ° ° ° ° °AMBULATORY SURGERY  °DISCHARGE INSTRUCTIONS ° ° °1) The drugs that you were given will stay in your system until tomorrow so for the next 24 hours you should not: ° °A) Drive an automobile °B) Make any legal decisions °C) Drink any alcoholic beverage ° ° °2) You may resume regular meals tomorrow.  Today it is better to start with liquids and gradually work up to solid foods. ° °You may eat anything you prefer, but it is better to start with liquids, then soup and crackers, and gradually work up to solid foods. ° ° °3) Please notify your doctor immediately if you have any unusual bleeding, trouble breathing, redness and pain at the surgery site, drainage, fever, or pain not relieved by medication. ° ° ° °4) Additional Instructions: ° ° ° ° ° ° ° °Please contact your physician with any  problems or Same Day Surgery at [phone], Monday through Friday 6 am to 4 pm, or [place] at  Main number at [phone]. ° °

## 2015-02-25 NOTE — Anesthesia Preprocedure Evaluation (Addendum)
Anesthesia Evaluation  Patient identified by MRN, date of birth, ID band Patient awake    Reviewed: Allergy & Precautions, NPO status , Patient's Chart, lab work & pertinent test results  History of Anesthesia Complications Negative for: history of anesthetic complications  Airway Mallampati: III  TM Distance: >3 FB Neck ROM: Full    Dental  (+) Teeth Intact   Pulmonary sleep apnea and Continuous Positive Airway Pressure Ventilation , former smoker (quit x 20 yrs),          Cardiovascular hypertension, Pt. on medications and Pt. on home beta blockers + Past MI and + Cardiac Stents (x 2 stents)     Neuro/Psych    GI/Hepatic GERD-  Medicated and Controlled,  Endo/Other  diabetes, Type 2  Renal/GU      Musculoskeletal   Abdominal   Peds  Hematology   Anesthesia Other Findings   Reproductive/Obstetrics                            Anesthesia Physical Anesthesia Plan  ASA: III  Anesthesia Plan: General   Post-op Pain Management:    Induction: Intravenous  Airway Management Planned: Nasal Cannula  Additional Equipment:   Intra-op Plan:   Post-operative Plan:   Informed Consent: I have reviewed the patients History and Physical, chart, labs and discussed the procedure including the risks, benefits and alternatives for the proposed anesthesia with the patient or authorized representative who has indicated his/her understanding and acceptance.     Plan Discussed with:   Anesthesia Plan Comments:         Anesthesia Quick Evaluation

## 2015-02-28 DIAGNOSIS — M2022 Hallux rigidus, left foot: Secondary | ICD-10-CM | POA: Diagnosis not present

## 2015-02-28 DIAGNOSIS — M2021 Hallux rigidus, right foot: Secondary | ICD-10-CM | POA: Diagnosis not present

## 2015-02-28 LAB — SURGICAL PATHOLOGY

## 2015-03-03 ENCOUNTER — Telehealth: Payer: Self-pay | Admitting: Family Medicine

## 2015-03-03 NOTE — Telephone Encounter (Signed)
[facility] send in refill request a week ago and according to patient there has been no response from our office.  Pt is needing refills on the following medications:  1. Lisinopril 20mg  2. Birdena Jubilee 100mg  3. Glipizide 5mg  4. Metoprolol 100mg .  Please call patient with status.  Last OV was on 01/25/15 and his next scheduled OV is on 04/29/2015

## 2015-03-08 ENCOUNTER — Telehealth: Payer: Self-pay | Admitting: Family Medicine

## 2015-03-08 MED ORDER — GLIPIZIDE 5 MG PO TABS: 5.0000 mg | ORAL_TABLET | Freq: Every day | ORAL | Status: DC

## 2015-03-08 MED ORDER — METOPROLOL SUCCINATE ER 100 MG PO TB24: 100.0000 mg | ORAL_TABLET | Freq: Every day | ORAL | Status: DC

## 2015-03-08 MED ORDER — SITAGLIPTIN PHOSPHATE 100 MG PO TABS: 100.0000 mg | ORAL_TABLET | Freq: Every day | ORAL | Status: DC

## 2015-03-08 MED ORDER — LISINOPRIL 20 MG PO TABS: 20.0000 mg | ORAL_TABLET | Freq: Two times a day (BID) | ORAL | Status: DC

## 2015-03-08 NOTE — Telephone Encounter (Signed)
[facility] send in refill request a week ago and according to patient there has been no response from our office. Pt is needing refills on the following medications:  1. Lisinopril 20mg  2. Birdena Jubilee 100mg  3. Glipizide 5mg  4. Metoprolol 100mg .  Please call patient with status. Last OV was on 01/25/15 and his next scheduled OV is on 04/29/2015  PT also called last week and he still has not heard anything from our office. Please advise.

## 2015-03-08 NOTE — Telephone Encounter (Signed)
Medication has been refilled and sent to [facility] 

## 2015-03-09 DIAGNOSIS — Z9889 Other specified postprocedural states: Secondary | ICD-10-CM | POA: Diagnosis not present

## 2015-03-11 NOTE — Telephone Encounter (Signed)
Patient stopped by this morning concerning the refill requests below. I informed him that they were [facility], however he states that in order for him to get the discounted rates it must be written as a 90 day supply not 30 days with refills. He is completely out of the Januvia and Glipizide and is requesting that this be done today. Patient states he had also called last week requesting these refills as well.

## 2015-03-11 NOTE — Telephone Encounter (Signed)
Spoke with patient to confirm that medication has been refilled and sent to [facility] and received on 03/08/2015 at 4:53pm.

## 2015-03-16 ENCOUNTER — Telehealth: Payer: Self-pay | Admitting: Family Medicine

## 2015-03-16 NOTE — Telephone Encounter (Signed)
Patient stopped by to inform you that he did receive his medications. However, the pharmacy is having trouble with Metoprolol Tartrate. The pharmacy is trying to figure out how it will work safely with his other medications. States that they have tried contacting the doctor but have received no response. The patient also dropped off paperwork with the same information and it was placed on Dr Manuella Ghazi desk. Please call [facility] to verify information thank you

## 2015-03-16 NOTE — Telephone Encounter (Signed)
I called [facility] and issue has been resolved and medication will be sent out

## 2015-04-11 ENCOUNTER — Telehealth: Payer: Self-pay | Admitting: Internal Medicine

## 2015-04-11 NOTE — Telephone Encounter (Signed)
Pt needs refills on the following medications: One Touch Ultra Blue Strip, Metformin, Hydrochlorothiazide, Amlodipine Besylate, Atorvastatin. To be sent to [facility]. Pt has an appt on 04/29/15.

## 2015-04-14 MED ORDER — ATORVASTATIN CALCIUM 40 MG PO TABS: 40.0000 mg | ORAL_TABLET | Freq: Every day | ORAL | Status: DC

## 2015-04-14 MED ORDER — AMLODIPINE BESYLATE 10 MG PO TABS: 10.0000 mg | ORAL_TABLET | Freq: Every day | ORAL | Status: DC

## 2015-04-14 MED ORDER — GLUCOSE BLOOD VI STRP: ORAL_STRIP | Status: DC

## 2015-04-14 MED ORDER — METFORMIN HCL 500 MG PO TABS: 500.0000 mg | ORAL_TABLET | Freq: Two times a day (BID) | ORAL | Status: DC

## 2015-04-14 MED ORDER — HYDROCHLOROTHIAZIDE 25 MG PO TABS: 25.0000 mg | ORAL_TABLET | Freq: Every day | ORAL | Status: DC

## 2015-04-14 NOTE — Telephone Encounter (Signed)
Medication has been refilled and sent to [facility] 

## 2015-04-18 DIAGNOSIS — Z23 Encounter for immunization: Secondary | ICD-10-CM | POA: Diagnosis not present

## 2015-04-29 ENCOUNTER — Ambulatory Visit: Payer: Managed Care, Other (non HMO) | Admitting: Family Medicine

## 2015-05-06 ENCOUNTER — Encounter: Payer: Self-pay | Admitting: Family Medicine

## 2015-05-06 ENCOUNTER — Ambulatory Visit (INDEPENDENT_AMBULATORY_CARE_PROVIDER_SITE_OTHER): Payer: Medicare Other | Admitting: Family Medicine

## 2015-05-06 VITALS — BP 118/72 | HR 74 | Temp 98.6°F | Resp 18 | Ht 66.0 in | Wt 225.9 lb

## 2015-05-06 DIAGNOSIS — K219 Gastro-esophageal reflux disease without esophagitis: Secondary | ICD-10-CM | POA: Insufficient documentation

## 2015-05-06 DIAGNOSIS — E785 Hyperlipidemia, unspecified: Secondary | ICD-10-CM

## 2015-05-06 DIAGNOSIS — I10 Essential (primary) hypertension: Secondary | ICD-10-CM

## 2015-05-06 DIAGNOSIS — I2510 Atherosclerotic heart disease of native coronary artery without angina pectoris: Secondary | ICD-10-CM | POA: Diagnosis not present

## 2015-05-06 DIAGNOSIS — M179 Osteoarthritis of knee, unspecified: Secondary | ICD-10-CM | POA: Insufficient documentation

## 2015-05-06 DIAGNOSIS — E119 Type 2 diabetes mellitus without complications: Secondary | ICD-10-CM

## 2015-05-06 DIAGNOSIS — M1990 Unspecified osteoarthritis, unspecified site: Secondary | ICD-10-CM | POA: Insufficient documentation

## 2015-05-06 LAB — POCT GLYCOSYLATED HEMOGLOBIN (HGB A1C): HEMOGLOBIN A1C: 6.2

## 2015-05-06 LAB — GLUCOSE, POCT (MANUAL RESULT ENTRY): POC Glucose: 149 mg/dl — AB (ref 70–99)

## 2015-05-06 NOTE — Progress Notes (Signed)
Name: Jason Farrell   MRN: [id number]    DOB: 04-14-1941   Date:05/06/2015       Progress Note  Subjective  Chief Complaint  Chief Complaint  Patient presents with  . Follow-up    3 mo  . Diabetes  . Hyperlipidemia  . Hypertension  . Gastroesophageal Reflux   Diabetes He presents for his follow-up diabetic visit. He has type 2 diabetes mellitus. Pertinent negatives for hypoglycemia include no headaches or sweats. Pertinent negatives for diabetes include no chest pain. Pertinent negatives for diabetic complications include no CVA. Current diabetic treatment includes oral agent (triple therapy). His weight is stable. He is following a diabetic diet. His breakfast blood glucose range is generally 130-140 mg/dl. An ACE inhibitor/angiotensin II receptor blocker is being taken. He sees a podiatrist. Hyperlipidemia This is a chronic problem. The problem is controlled. Recent lipid tests were reviewed and are normal. Exacerbating diseases include diabetes and obesity. Pertinent negatives include no chest pain, leg pain, myalgias or shortness of breath. Current antihyperlipidemic treatment includes statins. The current treatment provides significant improvement of lipids. There are no compliance problems.   Hypertension This is a chronic problem. The problem is controlled. Pertinent negatives include no chest pain, headaches, palpitations, shortness of breath or sweats. Past treatments include ACE inhibitors, diuretics, beta blockers and calcium channel blockers. Hypertensive end-organ damage includes CAD/MI. There is no history of kidney disease or CVA.    Past Medical History  Diagnosis Date  . Coronary artery disease   . Diabetes mellitus without complication ([place])   . Hypertension   . Osteoarthritis   . GERD (gastroesophageal reflux disease)   . Dyslipidemia   . Hyperlipidemia   . MI (myocardial infarction) [facility])     Past Surgical History  Procedure Laterality Date  . Cardiac  catheterization  2006    stent placement  . Coronary angioplasty with stent placement  2006    stent x 2   . Coronary angioplasty with stent placement  1996    stent x 3   . Colonoscopy    . Hallux valgus cheilectomy Right 02/25/2015    Procedure: HALLUX VALGUS CHEILECTOMY;  Surgeon: [name], DPM;  Location: [facility] ORS;  Service: Podiatry;  Laterality: Right;    Family History  Problem Relation Age of Onset  . Hyperlipidemia Father   . Hypertension Father   . Heart disease Sister   . Heart failure Sister   . Hypertension Sister   . Hyperlipidemia Sister   . Hypertension Sister     Social History   Social History  . Marital Status: Married    Spouse Name: N/A  . Number of Children: N/A  . Years of Education: N/A   Occupational History  . Not on file.   Social History Main Topics  . Smoking status: Former Smoker -- 1.50 packs/day for 35 years    Types: Cigarettes    Quit date: 08/27/1994  . Smokeless tobacco: Not on file  . Alcohol Use: 1.2 oz/week    2 Glasses of wine per week  . Drug Use: No  . Sexual Activity: Not on file   Other Topics Concern  . Not on file   Social History Narrative     Current outpatient prescriptions:  .  amLODipine (NORVASC) 10 MG tablet, Take 1 tablet (10 mg total) by mouth daily., Disp: 90 tablet, Rfl: 0 .  aspirin 81 MG tablet, Take 81 mg by mouth daily., Disp: , Rfl:  .  atorvastatin (LIPITOR)  40 MG tablet, Take 1 tablet (40 mg total) by mouth daily., Disp: 90 tablet, Rfl: 0 .  Coenzyme Q10 (CO Q10) 200 MG CAPS, Take 1 capsule by mouth daily., Disp: , Rfl:  .  docusate sodium (COLACE) 100 MG capsule, Take 200 mg by mouth daily., Disp: , Rfl:  .  glipiZIDE (GLUCOTROL) 5 MG tablet, Take 1 tablet (5 mg total) by mouth daily., Disp: 30 tablet, Rfl: 2 .  glucose blood test strip, Use as instructed, Disp: 100 each, Rfl: 12 .  hydrochlorothiazide (HYDRODIURIL) 25 MG tablet, Take 1 tablet (25 mg total) by mouth daily., Disp: 90  tablet, Rfl: 0 .  HYDROcodone-acetaminophen (NORCO) 7.5-325 MG per tablet, Take 1 tablet by mouth every 6 (six) hours as needed for moderate pain., Disp: 30 tablet, Rfl: 0 .  lisinopril (PRINIVIL,ZESTRIL) 20 MG tablet, Take 1 tablet (20 mg total) by mouth 2 (two) times daily., Disp: 60 tablet, Rfl: 2 .  metFORMIN (GLUCOPHAGE) 500 MG tablet, Take 1 tablet (500 mg total) by mouth 2 (two) times daily with a meal., Disp: 180 tablet, Rfl: 0 .  metoprolol succinate (TOPROL-XL) 100 MG 24 hr tablet, Take 1 tablet (100 mg total) by mouth daily. Take with or immediately following a meal., Disp: 30 tablet, Rfl: 1 .  Multiple Vitamins-Minerals (CENTRUM SILVER PO), Take by mouth daily., Disp: , Rfl:  .  Omega-3 Fatty Acids (FISH OIL) 1200 MG CAPS, Take 1,200 mg by mouth daily., Disp: , Rfl:  .  omeprazole (PRILOSEC) 40 MG capsule, Take 40 mg by mouth daily., Disp: , Rfl:  .  sitaGLIPtin (JANUVIA) 100 MG tablet, Take 1 tablet (100 mg total) by mouth daily., Disp: 30 tablet, Rfl: 1 .  Vitamin D, Cholecalciferol, 1000 UNITS CAPS, Take 1,000 units of lipase by mouth daily., Disp: , Rfl:   No Known Allergies  Review of Systems  Respiratory: Negative for shortness of breath.   Cardiovascular: Negative for chest pain, palpitations and claudication.  Musculoskeletal: Negative for myalgias.  Neurological: Negative for headaches.    Objective  Filed Vitals:   05/06/15 0847  BP: 118/72  Pulse: 74  Temp: 98.6 F (37 C)  TempSrc: Oral  Resp: 18  Height: 5\' 6"  (1.676 m)  Weight: 225 lb 14.4 oz (102.468 kg)  SpO2: 98%    Physical Exam  Constitutional: He is oriented to person, place, and time and well-developed, well-nourished, and in no distress.  HENT:  Head: Normocephalic and atraumatic.  Cardiovascular: Normal rate, regular rhythm and normal heart sounds.   No murmur heard. Pulmonary/Chest: Effort normal and breath sounds normal. He has no wheezes. He has no rales.  Abdominal: Soft. Bowel sounds  are normal. There is no tenderness.  Neurological: He is alert and oriented to person, place, and time.  Skin: Skin is warm and dry.  Psychiatric: Memory, affect and judgment normal.  Nursing note and vitals reviewed.   Assessment & Plan  1. Essential hypertension Blood pressure is stable and controlled on present therapy.  2. Controlled type 2 diabetes mellitus without complication, without long-term current use of insulin (HCC) A1c is 6.2%, consistent with well-controlled diabetes. No change in pharmacotherapy at this time. - POCT HgB A1C - POCT Glucose (CBG)  3. Hyperlipidemia  FLP at goal in July 2016. Continue on Lipitor 40 mg daily and recheck in 3 months.  Nassir Neidert Asad A. [facility] 05/06/2015 9:08 AM

## 2015-05-12 DIAGNOSIS — L570 Actinic keratosis: Secondary | ICD-10-CM | POA: Diagnosis not present

## 2015-05-12 DIAGNOSIS — D2261 Melanocytic nevi of right upper limb, including shoulder: Secondary | ICD-10-CM | POA: Diagnosis not present

## 2015-05-12 DIAGNOSIS — X32XXXA Exposure to sunlight, initial encounter: Secondary | ICD-10-CM | POA: Diagnosis not present

## 2015-05-12 DIAGNOSIS — D225 Melanocytic nevi of trunk: Secondary | ICD-10-CM | POA: Diagnosis not present

## 2015-05-12 DIAGNOSIS — D2272 Melanocytic nevi of left lower limb, including hip: Secondary | ICD-10-CM | POA: Diagnosis not present

## 2015-05-12 DIAGNOSIS — Z85828 Personal history of other malignant neoplasm of skin: Secondary | ICD-10-CM | POA: Diagnosis not present

## 2015-06-03 ENCOUNTER — Other Ambulatory Visit: Payer: Self-pay | Admitting: Family Medicine

## 2015-06-07 ENCOUNTER — Telehealth: Payer: Self-pay

## 2015-06-07 MED ORDER — GLIPIZIDE 5 MG PO TABS
5.0000 mg | ORAL_TABLET | Freq: Every day | ORAL | Status: DC
Start: 2015-06-07 — End: 2015-10-10

## 2015-06-07 MED ORDER — LISINOPRIL 20 MG PO TABS: 20.0000 mg | ORAL_TABLET | Freq: Two times a day (BID) | ORAL | Status: DC

## 2015-06-07 MED ORDER — SITAGLIPTIN PHOSPHATE 100 MG PO TABS: 100.0000 mg | ORAL_TABLET | Freq: Every day | ORAL | Status: DC

## 2015-06-07 NOTE — Telephone Encounter (Signed)
Medication has been refilled and sent to [facility] 

## 2015-06-30 ENCOUNTER — Other Ambulatory Visit: Payer: Self-pay | Admitting: Family Medicine

## 2015-08-01 ENCOUNTER — Encounter: Payer: Self-pay | Admitting: Family Medicine

## 2015-08-01 ENCOUNTER — Ambulatory Visit (INDEPENDENT_AMBULATORY_CARE_PROVIDER_SITE_OTHER): Payer: Medicare Other | Admitting: Family Medicine

## 2015-08-01 VITALS — BP 126/74 | HR 76 | Temp 98.4°F | Resp 18 | Ht 66.0 in | Wt 234.0 lb

## 2015-08-01 DIAGNOSIS — K219 Gastro-esophageal reflux disease without esophagitis: Secondary | ICD-10-CM

## 2015-08-01 DIAGNOSIS — E119 Type 2 diabetes mellitus without complications: Secondary | ICD-10-CM | POA: Diagnosis not present

## 2015-08-01 DIAGNOSIS — I10 Essential (primary) hypertension: Secondary | ICD-10-CM

## 2015-08-01 DIAGNOSIS — E785 Hyperlipidemia, unspecified: Secondary | ICD-10-CM | POA: Diagnosis not present

## 2015-08-01 MED ORDER — OMEPRAZOLE 40 MG PO CPDR: 40.0000 mg | DELAYED_RELEASE_CAPSULE | Freq: Every day | ORAL | Status: DC

## 2015-08-01 NOTE — Progress Notes (Signed)
Name: Jason Farrell   MRN: [id number]    DOB: Jan 05, 1941   Date:08/01/2015       Progress Note  Subjective  Chief Complaint  Chief Complaint  Patient presents with  . Medication Refill    3 month f/u  . Diabetes    checks glucose 1qd low-140, high-160  . Hypertension  . Hyperlipidemia  . Gastroesophageal Reflux    Diabetes He presents for his follow-up diabetic visit. He has type 2 diabetes mellitus. Pertinent negatives for hypoglycemia include no headaches or sweats. Pertinent negatives for diabetes include no blurred vision, no chest pain, no fatigue, no foot paresthesias, no polydipsia and no polyuria. Pertinent negatives for diabetic complications include no CVA. Current diabetic treatment includes oral agent (triple therapy). His weight is stable. He is following a diabetic diet. His breakfast blood glucose range is generally 130-140 mg/dl. An ACE inhibitor/angiotensin II receptor blocker is being taken. He sees a podiatrist.Eye exam is not current.  Hypertension This is a chronic problem. The problem is controlled. Pertinent negatives include no blurred vision, chest pain, headaches, malaise/fatigue, palpitations, shortness of breath or sweats. Past treatments include ACE inhibitors, diuretics, beta blockers and calcium channel blockers. Hypertensive end-organ damage includes CAD/MI. There is no history of kidney disease or CVA.  Hyperlipidemia This is a chronic problem. The problem is controlled. Recent lipid tests were reviewed and are normal. Exacerbating diseases include diabetes and obesity. Pertinent negatives include no chest pain, leg pain, myalgias or shortness of breath. Current antihyperlipidemic treatment includes statins. The current treatment provides significant improvement of lipids. There are no compliance problems.   Gastroesophageal Reflux He reports no abdominal pain or no chest pain. This is a chronic problem. Pertinent negatives include no fatigue. He has tried a  PPI for the symptoms.    Past Medical History  Diagnosis Date  . Coronary artery disease   . Diabetes mellitus without complication ([place])   . Hypertension   . Osteoarthritis   . GERD (gastroesophageal reflux disease)   . Dyslipidemia   . Hyperlipidemia   . MI (myocardial infarction) [facility])     Past Surgical History  Procedure Laterality Date  . Cardiac catheterization  2006    stent placement  . Coronary angioplasty with stent placement  2006    stent x 2   . Coronary angioplasty with stent placement  1996    stent x 3   . Colonoscopy    . Hallux valgus cheilectomy Right 02/25/2015    Procedure: HALLUX VALGUS CHEILECTOMY;  Surgeon: [name], DPM;  Location: [facility] ORS;  Service: Podiatry;  Laterality: Right;    Family History  Problem Relation Age of Onset  . Hyperlipidemia Father   . Hypertension Father   . Heart disease Sister   . Heart failure Sister   . Hypertension Sister   . Hyperlipidemia Sister   . Hypertension Sister     Social History   Social History  . Marital Status: Married    Spouse Name: N/A  . Number of Children: N/A  . Years of Education: N/A   Occupational History  . Not on file.   Social History Main Topics  . Smoking status: Former Smoker -- 1.50 packs/day for 35 years    Types: Cigarettes    Quit date: 08/27/1994  . Smokeless tobacco: Not on file  . Alcohol Use: 1.2 oz/week    2 Glasses of wine per week  . Drug Use: No  . Sexual Activity: Not on file  Other Topics Concern  . Not on file   Social History Narrative     Current outpatient prescriptions:  .  amLODipine (NORVASC) 10 MG tablet, TAKE 1 TABLET DAILY, Disp: 90 tablet, Rfl: 0 .  aspirin 81 MG tablet, Take 81 mg by mouth daily., Disp: , Rfl:  .  atorvastatin (LIPITOR) 40 MG tablet, TAKE 1 TABLET DAILY, Disp: 90 tablet, Rfl: 0 .  Coenzyme Q10 (CO Q10) 200 MG CAPS, Take 1 capsule by mouth daily., Disp: , Rfl:  .  docusate sodium (COLACE) 100 MG capsule, Take 200 mg  by mouth daily., Disp: , Rfl:  .  glipiZIDE (GLUCOTROL) 5 MG tablet, Take 1 tablet (5 mg total) by mouth daily., Disp: 90 tablet, Rfl: 0 .  glucose blood test strip, Use as instructed, Disp: 100 each, Rfl: 12 .  hydrochlorothiazide (HYDRODIURIL) 25 MG tablet, TAKE 1 TABLET DAILY, Disp: 90 tablet, Rfl: 0 .  lisinopril (PRINIVIL,ZESTRIL) 20 MG tablet, Take 1 tablet (20 mg total) by mouth 2 (two) times daily., Disp: 180 tablet, Rfl: 0 .  metFORMIN (GLUCOPHAGE) 500 MG tablet, TAKE 1 TABLET TWICE A DAY WITH MEALS, Disp: 180 tablet, Rfl: 2 .  metoprolol succinate (TOPROL-XL) 100 MG 24 hr tablet, TAKE 1 TABLET DAILY,  WITH OR IMMEDIATELY FOLLOWING A MEAL, Disp: 90 tablet, Rfl: 1 .  Multiple Vitamins-Minerals (CENTRUM SILVER PO), Take by mouth daily., Disp: , Rfl:  .  Omega-3 Fatty Acids (FISH OIL) 1200 MG CAPS, Take 1,200 mg by mouth daily., Disp: , Rfl:  .  omeprazole (PRILOSEC) 40 MG capsule, Take 40 mg by mouth daily., Disp: , Rfl:  .  sitaGLIPtin (JANUVIA) 100 MG tablet, Take 1 tablet (100 mg total) by mouth daily., Disp: 90 tablet, Rfl: 0 .  Vitamin D, Cholecalciferol, 1000 UNITS CAPS, Take 1,000 units of lipase by mouth daily., Disp: , Rfl:   No Known Allergies   Review of Systems  Constitutional: Negative for fever, chills, malaise/fatigue and fatigue.  Eyes: Negative for blurred vision and double vision.  Respiratory: Negative for shortness of breath.   Cardiovascular: Negative for chest pain and palpitations.  Gastrointestinal: Negative for abdominal pain.  Musculoskeletal: Negative for myalgias.  Neurological: Negative for headaches.  Endo/Heme/Allergies: Negative for polydipsia.     Objective  Filed Vitals:   08/01/15 0856  BP: 126/74  Pulse: 76  Temp: 98.4 F (36.9 C)  TempSrc: Oral  Resp: 18  Height: 5\' 6"  (1.676 m)  Weight: 234 lb (106.142 kg)  SpO2: 96%    Physical Exam  Constitutional: He is oriented to person, place, and time and well-developed, well-nourished,  and in no distress.  HENT:  Head: Normocephalic and atraumatic.  Eyes: Pupils are equal, round, and reactive to light.  Cardiovascular: Normal rate and regular rhythm.   Pulmonary/Chest: Effort normal and breath sounds normal.  Abdominal: Soft. Bowel sounds are normal.  Musculoskeletal: He exhibits edema (1+ pitting edema).  Neurological: He is alert and oriented to person, place, and time.  Skin: Skin is warm and dry.  Nursing note and vitals reviewed.    Assessment & Plan  1. Controlled type 2 diabetes mellitus without complication, without long-term current use of insulin (HCC)  - POCT HgB A1C - POCT Glucose (CBG) - Urine Microalbumin w/creat. ratio  2. Gastroesophageal reflux disease, esophagitis presence not specified  - omeprazole (PRILOSEC) 40 MG capsule; Take 1 capsule (40 mg total) by mouth daily.  Dispense: 90 capsule; Refill: 1  3. Hyperlipidemia   4. Essential  hypertension BP stable and controlled on present therapy.    Teancum Brule Asad A. [facility] 08/01/2015 9:08 AM

## 2015-08-04 ENCOUNTER — Ambulatory Visit (INDEPENDENT_AMBULATORY_CARE_PROVIDER_SITE_OTHER): Payer: Medicare Other | Admitting: Cardiovascular Disease

## 2015-08-04 ENCOUNTER — Encounter: Payer: Self-pay | Admitting: Cardiovascular Disease

## 2015-08-04 VITALS — BP 110/60 | HR 65 | Ht 66.5 in | Wt 231.5 lb

## 2015-08-04 DIAGNOSIS — I2510 Atherosclerotic heart disease of native coronary artery without angina pectoris: Secondary | ICD-10-CM

## 2015-08-04 DIAGNOSIS — E119 Type 2 diabetes mellitus without complications: Secondary | ICD-10-CM

## 2015-08-04 DIAGNOSIS — I10 Essential (primary) hypertension: Secondary | ICD-10-CM

## 2015-08-04 DIAGNOSIS — J449 Chronic obstructive pulmonary disease, unspecified: Secondary | ICD-10-CM

## 2015-08-04 DIAGNOSIS — E785 Hyperlipidemia, unspecified: Secondary | ICD-10-CM

## 2015-08-04 NOTE — Assessment & Plan Note (Signed)
He denies having shortness of breath on exertion Stopped smoking many years ago, not on inhalers

## 2015-08-04 NOTE — Progress Notes (Signed)
Patient ID: Jason Farrell, male    DOB: 07-13-1941, 75 y.o.   MRN: [id number]     Patient ID: Jason Farrell, male    DOB: 02/02/1941, 75 y.o.   MRN: [id number]  HPI Comments:  Jason Farrell is a pleasant 75 year old gentleman with a history of coronary artery disease, stent 3 in 1996 after MI, stent 2 in 2006 for in-stent restenosis, prior smoking history of 30-35 years, diabetes who presents for follow-up of his coronary artery disease.  In follow-up today, he reports that he is doing well. He is active, recently shoveling gravel with no symptoms of angina. Previous anginal symptoms included arm pain. None recently He reports having bilateral hip pain, unable to exercise on a regular basis  Hemoglobin A1c 6.2, numbers reviewed with him Also total cholesterol 127, tolerating Lipitor 40 mg daily  EKG on today's visit shows normal sinus rhythm with rate 64 bpm, right bundle branch block  Other past medical history He stopped smoking in 1996 after his MI .   No Known Allergies  Current Outpatient Prescriptions on File Prior to Visit  Medication Sig Dispense Refill  . amLODipine (NORVASC) 10 MG tablet TAKE 1 TABLET DAILY 90 tablet 0  . aspirin 81 MG tablet Take 81 mg by mouth daily.    Marland Kitchen atorvastatin (LIPITOR) 40 MG tablet TAKE 1 TABLET DAILY 90 tablet 0  . Coenzyme Q10 (CO Q10) 200 MG CAPS Take 1 capsule by mouth daily.    Marland Kitchen glipiZIDE (GLUCOTROL) 5 MG tablet Take 1 tablet (5 mg total) by mouth daily. 90 tablet 0  . glucose blood test strip Use as instructed 100 each 12  . hydrochlorothiazide (HYDRODIURIL) 25 MG tablet TAKE 1 TABLET DAILY 90 tablet 0  . lisinopril (PRINIVIL,ZESTRIL) 20 MG tablet Take 1 tablet (20 mg total) by mouth 2 (two) times daily. 180 tablet 0  . metFORMIN (GLUCOPHAGE) 500 MG tablet TAKE 1 TABLET TWICE A DAY WITH MEALS 180 tablet 2  . metoprolol succinate (TOPROL-XL) 100 MG 24 hr tablet TAKE 1 TABLET DAILY,  WITH OR IMMEDIATELY FOLLOWING A MEAL 90 tablet 1  .  Multiple Vitamins-Minerals (CENTRUM SILVER PO) Take by mouth daily.    . Omega-3 Fatty Acids (FISH OIL) 1200 MG CAPS Take 1,200 mg by mouth daily.    Marland Kitchen omeprazole (PRILOSEC) 40 MG capsule Take 1 capsule (40 mg total) by mouth daily. 90 capsule 1  . sitaGLIPtin (JANUVIA) 100 MG tablet Take 1 tablet (100 mg total) by mouth daily. 90 tablet 0  . Vitamin D, Cholecalciferol, 1000 UNITS CAPS Take 1,000 units of lipase by mouth daily.     No current facility-administered medications on file prior to visit.    Past Medical History  Diagnosis Date  . Coronary artery disease   . Diabetes mellitus without complication ([place])   . Hypertension   . Osteoarthritis   . GERD (gastroesophageal reflux disease)   . Dyslipidemia   . Hyperlipidemia   . MI (myocardial infarction) [facility])     Past Surgical History  Procedure Laterality Date  . Cardiac catheterization  2006    stent placement  . Coronary angioplasty with stent placement  2006    stent x 2   . Coronary angioplasty with stent placement  1996    stent x 3   . Colonoscopy    . Hallux valgus cheilectomy Right 02/25/2015    Procedure: HALLUX VALGUS CHEILECTOMY;  Surgeon: [name], DPM;  Location: [facility] ORS;  Service: Podiatry;  Laterality:  Right;  . Foot surgery Right     Social History  reports that he quit smoking about 20 years ago. His smoking use included Cigarettes. He has a 52.5 pack-year smoking history. He does not have any smokeless tobacco history on file. He reports that he drinks about 1.2 oz of alcohol per week. He reports that he does not use illicit drugs.  Family History family history includes Heart disease in his sister; Heart failure in his sister; Hyperlipidemia in his father and sister; Hypertension in his father, sister, and sister.   Review of Systems  Constitutional: Negative.   Respiratory: Negative.   Cardiovascular: Negative.   Gastrointestinal: Negative.   Musculoskeletal: Negative.        Foot pain   Neurological: Negative.   Hematological: Negative.   Psychiatric/Behavioral: Negative.   All other systems reviewed and are negative.  BP 110/60 mmHg  Pulse 65  Ht 5' 6.5" (1.689 m)  Wt 231 lb 8 oz (105.008 kg)  BMI 36.81 kg/m2   Physical Exam  Constitutional: He is oriented to person, place, and time. He appears well-developed and well-nourished.  Obese  HENT:  Head: Normocephalic.  Nose: Nose normal.  Mouth/Throat: Oropharynx is clear and moist.  Eyes: Conjunctivae are normal.  Neck: Normal range of motion. Neck supple. No JVD present.  Cardiovascular: Normal rate, regular rhythm, S1 normal, S2 normal, normal heart sounds and intact distal pulses.  Exam reveals no gallop and no friction rub.   No murmur heard. Pulmonary/Chest: Effort normal and breath sounds normal. No respiratory distress. He has no wheezes. He has no rales. He exhibits no tenderness.  Abdominal: Soft. Bowel sounds are normal. He exhibits no distension. There is no tenderness.  Musculoskeletal: Normal range of motion. He exhibits no edema or tenderness.  Lymphadenopathy:    He has no cervical adenopathy.  Neurological: He is alert and oriented to person, place, and time. Coordination normal.  Skin: Skin is warm and dry. No rash noted. No erythema.  Psychiatric: He has a normal mood and affect. His behavior is normal. Judgment and thought content normal.      Assessment and Plan   Nursing note and vitals reviewed.

## 2015-08-04 NOTE — Assessment & Plan Note (Signed)
Blood pressure is well controlled on today's visit. No changes made to the medications. 

## 2015-08-04 NOTE — Assessment & Plan Note (Signed)
Cholesterol is at goal on the current lipid regimen. No changes to the medications were made.  

## 2015-08-04 NOTE — Assessment & Plan Note (Signed)
Hemoglobin A1c 6.2. We have encouraged continued exercise, careful diet management in an effort to lose weight.

## 2015-08-04 NOTE — Patient Instructions (Signed)
You are doing well. No medication changes were made.  Please call us if you have new issues that need to be addressed before your next appt.  Your physician wants you to follow-up in: 12 months.  You will receive a reminder letter in the mail two months in advance. If you don't receive a letter, please call our office to schedule the follow-up appointment. 

## 2015-08-04 NOTE — Assessment & Plan Note (Signed)
Continue current medication regimen. Atypical type chest pain under his ribs bilaterally after shoveling gravel, he does not give was his heart. Recommended he call us if he has recurrent symptoms on exertion

## 2015-08-08 ENCOUNTER — Ambulatory Visit: Payer: Medicare Other | Admitting: Family Medicine

## 2015-08-08 ENCOUNTER — Ambulatory Visit (INDEPENDENT_AMBULATORY_CARE_PROVIDER_SITE_OTHER): Payer: Medicare Other

## 2015-08-08 DIAGNOSIS — E119 Type 2 diabetes mellitus without complications: Secondary | ICD-10-CM

## 2015-08-08 LAB — POCT GLYCOSYLATED HEMOGLOBIN (HGB A1C): HEMOGLOBIN A1C: 6.9

## 2015-08-08 LAB — POCT UA - MICROALBUMIN: MICROALBUMIN (UR) POC: 50 mg/L

## 2015-08-10 ENCOUNTER — Ambulatory Visit: Payer: Managed Care, Other (non HMO) | Admitting: Cardiovascular Disease

## 2015-08-17 ENCOUNTER — Other Ambulatory Visit: Payer: Self-pay | Admitting: Family Medicine

## 2015-09-01 ENCOUNTER — Other Ambulatory Visit: Payer: Self-pay | Admitting: Family Medicine

## 2015-10-10 ENCOUNTER — Other Ambulatory Visit: Payer: Self-pay | Admitting: Family Medicine

## 2015-11-07 ENCOUNTER — Encounter: Payer: Self-pay | Admitting: Family Medicine

## 2015-11-07 ENCOUNTER — Ambulatory Visit (INDEPENDENT_AMBULATORY_CARE_PROVIDER_SITE_OTHER): Payer: Medicare Other | Admitting: Family Medicine

## 2015-11-07 VITALS — BP 120/68 | HR 74 | Temp 98.6°F | Resp 16 | Ht 67.0 in | Wt 237.3 lb

## 2015-11-07 DIAGNOSIS — E119 Type 2 diabetes mellitus without complications: Secondary | ICD-10-CM | POA: Diagnosis not present

## 2015-11-07 DIAGNOSIS — E669 Obesity, unspecified: Secondary | ICD-10-CM | POA: Diagnosis not present

## 2015-11-07 DIAGNOSIS — E785 Hyperlipidemia, unspecified: Secondary | ICD-10-CM | POA: Diagnosis not present

## 2015-11-07 DIAGNOSIS — I2510 Atherosclerotic heart disease of native coronary artery without angina pectoris: Secondary | ICD-10-CM

## 2015-11-07 DIAGNOSIS — I10 Essential (primary) hypertension: Secondary | ICD-10-CM | POA: Diagnosis not present

## 2015-11-07 LAB — POCT GLYCOSYLATED HEMOGLOBIN (HGB A1C): HEMOGLOBIN A1C: 7.4

## 2015-11-07 LAB — GLUCOSE, POCT (MANUAL RESULT ENTRY): POC GLUCOSE: 155 mg/dL — AB (ref 70–99)

## 2015-11-07 MED ORDER — METFORMIN HCL 1000 MG PO TABS: 1000.0000 mg | ORAL_TABLET | Freq: Two times a day (BID) | ORAL | Status: DC

## 2015-11-07 NOTE — Progress Notes (Signed)
Name: Jason Farrell   MRN: [id number]    DOB: 1941-03-02   Date:11/07/2015       Progress Note  Subjective  Chief Complaint  Chief Complaint  Patient presents with  . Follow-up    3 mo  . Diabetes  . Hyperlipidemia  . Hypertension    Diabetes He presents for his follow-up diabetic visit. He has type 2 diabetes mellitus. His disease course has been worsening. There are no hypoglycemic associated symptoms. Pertinent negatives for hypoglycemia include no headaches, mood changes or nervousness/anxiousness. Pertinent negatives for diabetes include no chest pain, no fatigue, no foot paresthesias, no polydipsia, no polyuria and no weight loss. Pertinent negatives for diabetic complications include no CVA. Current diabetic treatment includes oral agent (triple therapy). His weight is stable. He is following a diabetic diet. His breakfast blood glucose range is generally 140-180 mg/dl. An ACE inhibitor/angiotensin II receptor blocker is being taken.  Hyperlipidemia This is a chronic problem. The problem is controlled. Exacerbating diseases include diabetes and obesity. Pertinent negatives include no chest pain, leg pain, myalgias or shortness of breath. Current antihyperlipidemic treatment includes statins.  Hypertension This is a chronic problem. The problem is unchanged. The problem is controlled. Pertinent negatives include no chest pain, headaches, malaise/fatigue or shortness of breath. Past treatments include beta blockers, diuretics, calcium channel blockers and ACE inhibitors. Hypertensive end-organ damage includes CAD/MI. There is no history of kidney disease or CVA.    Past Medical History  Diagnosis Date  . Coronary artery disease   . Diabetes mellitus without complication ([place])   . Hypertension   . Osteoarthritis   . GERD (gastroesophageal reflux disease)   . Dyslipidemia   . Hyperlipidemia   . MI (myocardial infarction) [facility])     Past Surgical History  Procedure Laterality Date   . Cardiac catheterization  2006    stent placement  . Coronary angioplasty with stent placement  2006    stent x 2   . Coronary angioplasty with stent placement  1996    stent x 3   . Colonoscopy    . Hallux valgus cheilectomy Right 02/25/2015    Procedure: HALLUX VALGUS CHEILECTOMY;  Surgeon: [name], DPM;  Location: [facility] ORS;  Service: Podiatry;  Laterality: Right;  . Foot surgery Right     Family History  Problem Relation Age of Onset  . Hyperlipidemia Father   . Hypertension Father   . Heart disease Sister   . Heart failure Sister   . Hypertension Sister   . Hyperlipidemia Sister   . Hypertension Sister     Social History   Social History  . Marital Status: Married    Spouse Name: N/A  . Number of Children: N/A  . Years of Education: N/A   Occupational History  . Not on file.   Social History Main Topics  . Smoking status: Former Smoker -- 1.50 packs/day for 35 years    Types: Cigarettes    Quit date: 08/27/1994  . Smokeless tobacco: Not on file  . Alcohol Use: 1.2 oz/week    2 Glasses of wine per week  . Drug Use: No  . Sexual Activity: Not on file   Other Topics Concern  . Not on file   Social History Narrative     Current outpatient prescriptions:  .  amLODipine (NORVASC) 10 MG tablet, TAKE 1 TABLET DAILY, Disp: 90 tablet, Rfl: 1 .  aspirin 81 MG tablet, Take 81 mg by mouth daily., Disp: , Rfl:  .  atorvastatin (LIPITOR) 40 MG tablet, TAKE 1 TABLET DAILY, Disp: 90 tablet, Rfl: 1 .  Coenzyme Q10 (CO Q10) 200 MG CAPS, Take 1 capsule by mouth daily., Disp: , Rfl:  .  glipiZIDE (GLUCOTROL) 5 MG tablet, TAKE 1 TABLET DAILY, Disp: 90 tablet, Rfl: 1 .  glucose blood test strip, Use as instructed, Disp: 100 each, Rfl: 12 .  hydrochlorothiazide (HYDRODIURIL) 25 MG tablet, TAKE 1 TABLET DAILY, Disp: 90 tablet, Rfl: 1 .  JANUVIA 100 MG tablet, TAKE 1 TABLET DAILY, Disp: 90 tablet, Rfl: 1 .  lisinopril (PRINIVIL,ZESTRIL) 20 MG tablet, TAKE 1 TABLET  TWICE A DAY, Disp: 180 tablet, Rfl: 0 .  metFORMIN (GLUCOPHAGE) 500 MG tablet, TAKE 1 TABLET TWICE A DAY WITH MEALS, Disp: 180 tablet, Rfl: 2 .  metoprolol succinate (TOPROL-XL) 100 MG 24 hr tablet, TAKE 1 TABLET DAILY,  WITH OR IMMEDIATELY FOLLOWING A MEAL, Disp: 90 tablet, Rfl: 1 .  Multiple Vitamins-Minerals (CENTRUM SILVER PO), Take by mouth daily., Disp: , Rfl:  .  Omega-3 Fatty Acids (FISH OIL) 1200 MG CAPS, Take 1,200 mg by mouth daily., Disp: , Rfl:  .  omeprazole (PRILOSEC) 40 MG capsule, Take 1 capsule (40 mg total) by mouth daily., Disp: 90 capsule, Rfl: 1 .  Vitamin D, Cholecalciferol, 1000 UNITS CAPS, Take 1,000 units of lipase by mouth daily., Disp: , Rfl:   No Known Allergies   Review of Systems  Constitutional: Negative for fever, chills, weight loss, malaise/fatigue and fatigue.  Respiratory: Negative for shortness of breath.   Cardiovascular: Negative for chest pain and leg swelling.  Gastrointestinal: Negative for nausea, vomiting and abdominal pain.  Musculoskeletal: Negative for myalgias.  Neurological: Negative for headaches.  Endo/Heme/Allergies: Negative for polydipsia.  Psychiatric/Behavioral: The patient is not nervous/anxious.     Objective  Filed Vitals:   11/07/15 0925  BP: 120/68  Pulse: 74  Temp: 98.6 F (37 C)  TempSrc: Oral  Resp: 16  Height: 5\' 7"  (1.702 m)  Weight: 237 lb 4.8 oz (107.639 kg)  SpO2: 98%    Physical Exam  Constitutional: He is oriented to person, place, and time and well-developed, well-nourished, and in no distress.  HENT:  Head: Normocephalic and atraumatic.  Cardiovascular: Normal rate and regular rhythm.   Pulmonary/Chest: Effort normal and breath sounds normal.  Abdominal: Soft. Bowel sounds are normal.  Musculoskeletal:       Right ankle: He exhibits swelling.  Neurological: He is alert and oriented to person, place, and time.  Psychiatric: Mood, memory, affect and judgment normal.  Nursing note and vitals  reviewed.    Assessment & Plan  1. Coronary artery disease involving native coronary artery of native heart without angina pectoris Patient follows with cardiology, continue on aspirin  2. Essential hypertension Blood pressure is at goal on present therapy  3. Controlled type 2 diabetes mellitus without complication, without long-term current use of insulin (HCC) A1c is worse from 6.9% to 7.4%, we will increase metformin to 1000 mg twice daily follow-up in 3 months - metFORMIN (GLUCOPHAGE) 1000 MG tablet; Take 1 tablet (1,000 mg total) by mouth 2 (two) times daily with a meal.  Dispense: 180 tablet; Refill: 1 - POCT UA - Microalbumin  4. Hyperlipidemia  - Lipid Profile  5. Obesity Patient wishes to continue walking but has bilateral hip pain, continue on dietary interventions for management of diabetes and weight loss.     Thorin Starner Asad A. [facility] 11/07/2015 9:31 AM

## 2015-11-08 LAB — LIPID PANEL
Chol/HDL Ratio: 2.7 ratio units (ref 0.0–5.0)
Cholesterol, Total: 126 mg/dL (ref 100–199)
HDL: 47 mg/dL (ref >39)
LDL CALC: 49 mg/dL (ref 0–99)
Triglycerides: 151 mg/dL — ABNORMAL HIGH (ref 0–149)
VLDL CHOLESTEROL CAL: 30 mg/dL (ref 5–40)

## 2015-11-09 DIAGNOSIS — X32XXXA Exposure to sunlight, initial encounter: Secondary | ICD-10-CM | POA: Diagnosis not present

## 2015-11-09 DIAGNOSIS — L570 Actinic keratosis: Secondary | ICD-10-CM | POA: Diagnosis not present

## 2015-11-09 DIAGNOSIS — L538 Other specified erythematous conditions: Secondary | ICD-10-CM | POA: Diagnosis not present

## 2015-11-09 DIAGNOSIS — B078 Other viral warts: Secondary | ICD-10-CM | POA: Diagnosis not present

## 2015-11-09 DIAGNOSIS — Z85828 Personal history of other malignant neoplasm of skin: Secondary | ICD-10-CM | POA: Diagnosis not present

## 2015-11-09 DIAGNOSIS — D2261 Melanocytic nevi of right upper limb, including shoulder: Secondary | ICD-10-CM | POA: Diagnosis not present

## 2015-11-09 DIAGNOSIS — D225 Melanocytic nevi of trunk: Secondary | ICD-10-CM | POA: Diagnosis not present

## 2015-11-09 DIAGNOSIS — L0889 Other specified local infections of the skin and subcutaneous tissue: Secondary | ICD-10-CM | POA: Diagnosis not present

## 2015-11-09 DIAGNOSIS — L821 Other seborrheic keratosis: Secondary | ICD-10-CM | POA: Diagnosis not present

## 2015-12-06 ENCOUNTER — Other Ambulatory Visit: Payer: Self-pay | Admitting: Family Medicine

## 2015-12-09 ENCOUNTER — Encounter: Payer: Self-pay | Admitting: Family Medicine

## 2015-12-09 ENCOUNTER — Ambulatory Visit (INDEPENDENT_AMBULATORY_CARE_PROVIDER_SITE_OTHER): Payer: Medicare Other | Admitting: Family Medicine

## 2015-12-09 VITALS — BP 137/61 | HR 75 | Temp 98.7°F | Resp 15 | Ht 67.0 in | Wt 238.2 lb

## 2015-12-09 DIAGNOSIS — Z87891 Personal history of nicotine dependence: Secondary | ICD-10-CM

## 2015-12-09 DIAGNOSIS — Z9989 Dependence on other enabling machines and devices: Secondary | ICD-10-CM

## 2015-12-09 DIAGNOSIS — I2510 Atherosclerotic heart disease of native coronary artery without angina pectoris: Secondary | ICD-10-CM | POA: Diagnosis not present

## 2015-12-09 DIAGNOSIS — Z13820 Encounter for screening for osteoporosis: Secondary | ICD-10-CM | POA: Diagnosis not present

## 2015-12-09 DIAGNOSIS — Z0000 Encounter for general adult medical examination without abnormal findings: Secondary | ICD-10-CM

## 2015-12-09 DIAGNOSIS — G4733 Obstructive sleep apnea (adult) (pediatric): Secondary | ICD-10-CM | POA: Insufficient documentation

## 2015-12-09 NOTE — Progress Notes (Signed)
Name: Jason Farrell   MRN: [id number]    DOB: 1941/06/01   Date:12/09/2015       Progress Note  Subjective  Chief Complaint  Chief Complaint  Patient presents with  . Annual Exam    CPE    HPI  Pt. Is here for a Complete Physical Exam. He is doing well. His last colonoscopy was in 2015 in [place]. His last prostate exam was in 2015 along with the PSA.   He needs CPAP supplies, has history of Obstructive Sleep Apnea, last sleep study was over 1 year ago in [place].Will need to be referred for a fresh sleep study   Past Medical History  Diagnosis Date  . Coronary artery disease   . Diabetes mellitus without complication ([place])   . Hypertension   . Osteoarthritis   . GERD (gastroesophageal reflux disease)   . Dyslipidemia   . Hyperlipidemia   . MI (myocardial infarction) [facility])     Past Surgical History  Procedure Laterality Date  . Cardiac catheterization  2006    stent placement  . Coronary angioplasty with stent placement  2006    stent x 2   . Coronary angioplasty with stent placement  1996    stent x 3   . Colonoscopy    . Hallux valgus cheilectomy Right 02/25/2015    Procedure: HALLUX VALGUS CHEILECTOMY;  Surgeon: [name], DPM;  Location: [facility] ORS;  Service: Podiatry;  Laterality: Right;  . Foot surgery Right     Family History  Problem Relation Age of Onset  . Hyperlipidemia Father   . Hypertension Father   . Heart disease Sister   . Heart failure Sister   . Hypertension Sister   . Hyperlipidemia Sister   . Hypertension Sister     Social History   Social History  . Marital Status: Married    Spouse Name: N/A  . Number of Children: N/A  . Years of Education: N/A   Occupational History  . Not on file.   Social History Main Topics  . Smoking status: Former Smoker -- 1.50 packs/day for 35 years    Types: Cigarettes    Quit date: 08/27/1994  . Smokeless tobacco: Not on file  . Alcohol Use: 1.2 oz/week    2 Glasses of wine per week  .  Drug Use: No  . Sexual Activity: Not on file   Other Topics Concern  . Not on file   Social History Narrative     Current outpatient prescriptions:  .  amLODipine (NORVASC) 10 MG tablet, TAKE 1 TABLET DAILY, Disp: 90 tablet, Rfl: 1 .  aspirin 81 MG tablet, Take 81 mg by mouth daily., Disp: , Rfl:  .  atorvastatin (LIPITOR) 40 MG tablet, TAKE 1 TABLET DAILY, Disp: 90 tablet, Rfl: 1 .  Coenzyme Q10 (CO Q10) 200 MG CAPS, Take 1 capsule by mouth daily., Disp: , Rfl:  .  glipiZIDE (GLUCOTROL) 5 MG tablet, TAKE 1 TABLET DAILY, Disp: 90 tablet, Rfl: 1 .  glucose blood test strip, Use as instructed, Disp: 100 each, Rfl: 12 .  hydrochlorothiazide (HYDRODIURIL) 25 MG tablet, TAKE 1 TABLET DAILY, Disp: 90 tablet, Rfl: 1 .  JANUVIA 100 MG tablet, TAKE 1 TABLET DAILY, Disp: 90 tablet, Rfl: 1 .  lisinopril (PRINIVIL,ZESTRIL) 20 MG tablet, TAKE 1 TABLET TWICE A DAY, Disp: 180 tablet, Rfl: 0 .  metFORMIN (GLUCOPHAGE) 1000 MG tablet, Take 1 tablet (1,000 mg total) by mouth 2 (two) times daily with a meal., Disp:  180 tablet, Rfl: 1 .  metoprolol succinate (TOPROL-XL) 100 MG 24 hr tablet, TAKE 1 TABLET DAILY,  WITH OR IMMEDIATELY FOLLOWING A MEAL, Disp: 90 tablet, Rfl: 0 .  Multiple Vitamins-Minerals (CENTRUM SILVER PO), Take by mouth daily., Disp: , Rfl:  .  Omega-3 Fatty Acids (FISH OIL) 1200 MG CAPS, Take 1,200 mg by mouth daily., Disp: , Rfl:  .  omeprazole (PRILOSEC) 40 MG capsule, Take 1 capsule (40 mg total) by mouth daily., Disp: 90 capsule, Rfl: 1 .  Vitamin D, Cholecalciferol, 1000 UNITS CAPS, Take 1,000 units of lipase by mouth daily., Disp: , Rfl:   No Known Allergies   Review of Systems  Constitutional: Negative for fever, chills and malaise/fatigue.  Eyes: Negative for blurred vision and double vision.  Respiratory: Positive for shortness of breath. Negative for cough.   Cardiovascular: Negative for chest pain and leg swelling.  Gastrointestinal: Negative for heartburn, nausea, vomiting  and blood in stool.  Genitourinary: Negative for dysuria.  Musculoskeletal: Positive for back pain. Negative for neck pain.  Skin: Negative for rash.  Neurological: Negative for headaches.  Psychiatric/Behavioral: The patient is not nervous/anxious and does not have insomnia.     Objective  Filed Vitals:   12/09/15 0905  BP: 137/61  Pulse: 75  Temp: 98.7 F (37.1 C)  TempSrc: Oral  Resp: 15  Height: 5\' 7"  (1.702 m)  Weight: 238 lb 3.2 oz (108.047 kg)  SpO2: 95%    Physical Exam  Constitutional: He is oriented to person, place, and time and well-developed, well-nourished, and in no distress.  HENT:  Head: Normocephalic and atraumatic.  Cardiovascular: Normal rate and regular rhythm.   Pulmonary/Chest: Effort normal and breath sounds normal.  Abdominal: Soft. Bowel sounds are normal.  Genitourinary: Rectum normal and prostate normal. Prostate is not enlarged.  Musculoskeletal:       Right ankle: He exhibits swelling.       Left ankle: He exhibits swelling.  Trace bilateral pitting edema.  Neurological: He is alert and oriented to person, place, and time.  Psychiatric: Mood, memory, affect and judgment normal.  Nursing note and vitals reviewed.     Assessment & Plan  1. Stopped smoking with greater than 30 pack year history Low-dose CT scan for lung cancer screening - CT CHEST LUNG CA SCREEN LOW DOSE W/O CM; Future  2. Obstructive sleep apnea on CPAP  - Ambulatory referral to Sleep Studies  3. Annual physical exam Obtain age-appropriate screening for prostate and thyroid disorders. - PSA - TSH - Vitamin D (25 hydroxy)   Turner Baillie Asad A. [facility] 12/09/2015 9:19 AM

## 2015-12-10 LAB — TSH: TSH: 0.794 u[IU]/mL (ref 0.450–4.500)

## 2015-12-10 LAB — VITAMIN D 25 HYDROXY (VIT D DEFICIENCY, FRACTURES): Vit D, 25-Hydroxy: 45.3 ng/mL (ref 30.0–100.0)

## 2015-12-10 LAB — PSA: PROSTATE SPECIFIC AG, SERUM: 3.1 ng/mL (ref 0.0–4.0)

## 2015-12-21 ENCOUNTER — Other Ambulatory Visit: Payer: Self-pay | Admitting: Family Medicine

## 2016-01-19 ENCOUNTER — Telehealth: Payer: Self-pay | Admitting: *Deleted

## 2016-01-19 NOTE — Telephone Encounter (Signed)
Received referral for initial lung cancer screening scan. Contacted patient and obtained smoking history. Due to quit date of 1999, patient is not a candidate for screening as currently approved criteria require quit date within 15 years of screening scan. Discussed signs of lung cancer that should be reported to care provider with patient.

## 2016-02-28 ENCOUNTER — Other Ambulatory Visit: Payer: Self-pay | Admitting: Family Medicine

## 2016-02-28 DIAGNOSIS — K219 Gastro-esophageal reflux disease without esophagitis: Secondary | ICD-10-CM

## 2016-03-21 DIAGNOSIS — Z23 Encounter for immunization: Secondary | ICD-10-CM | POA: Diagnosis not present

## 2016-03-29 ENCOUNTER — Other Ambulatory Visit: Payer: Self-pay | Admitting: Family Medicine

## 2016-04-05 ENCOUNTER — Other Ambulatory Visit: Payer: Self-pay | Admitting: Family Medicine

## 2016-04-05 ENCOUNTER — Encounter: Payer: Self-pay | Admitting: Family Medicine

## 2016-04-05 ENCOUNTER — Ambulatory Visit (INDEPENDENT_AMBULATORY_CARE_PROVIDER_SITE_OTHER): Payer: Medicare Other | Admitting: Family Medicine

## 2016-04-05 VITALS — BP 124/64 | HR 75 | Temp 98.1°F | Resp 16 | Ht 67.0 in | Wt 232.0 lb

## 2016-04-05 DIAGNOSIS — I2510 Atherosclerotic heart disease of native coronary artery without angina pectoris: Secondary | ICD-10-CM

## 2016-04-05 DIAGNOSIS — E785 Hyperlipidemia, unspecified: Secondary | ICD-10-CM

## 2016-04-05 DIAGNOSIS — E119 Type 2 diabetes mellitus without complications: Secondary | ICD-10-CM

## 2016-04-05 DIAGNOSIS — I10 Essential (primary) hypertension: Secondary | ICD-10-CM | POA: Diagnosis not present

## 2016-04-05 LAB — LIPID PANEL
CHOL/HDL RATIO: 2.6 ratio (ref <=5.0)
Cholesterol: 129 mg/dL (ref 125–200)
HDL: 49 mg/dL (ref >=40)
LDL CALC: 53 mg/dL (ref <130)
Triglycerides: 134 mg/dL (ref <150)
VLDL: 27 mg/dL (ref <30)

## 2016-04-05 LAB — COMPLETE METABOLIC PANEL WITH GFR
ALT: 16 U/L (ref 9–46)
AST: 12 U/L (ref 10–35)
Albumin: 4.3 g/dL (ref 3.6–5.1)
Alkaline Phosphatase: 64 U/L (ref 40–115)
BUN: 17 mg/dL (ref 7–25)
CHLORIDE: 100 mmol/L (ref 98–110)
CO2: 29 mmol/L (ref 20–31)
CREATININE: 0.88 mg/dL (ref 0.70–1.18)
Calcium: 9.5 mg/dL (ref 8.6–10.3)
GFR, Est Non African American: 85 mL/min (ref >=60)
Glucose, Bld: 138 mg/dL — ABNORMAL HIGH (ref 65–99)
POTASSIUM: 4.6 mmol/L (ref 3.5–5.3)
Sodium: 139 mmol/L (ref 135–146)
Total Bilirubin: 0.7 mg/dL (ref 0.2–1.2)
Total Protein: 6.7 g/dL (ref 6.1–8.1)

## 2016-04-05 LAB — POCT GLYCOSYLATED HEMOGLOBIN (HGB A1C): Hemoglobin A1C: 6.8

## 2016-04-05 LAB — GLUCOSE, POCT (MANUAL RESULT ENTRY): POC Glucose: 141 mg/dl — AB (ref 70–99)

## 2016-04-05 MED ORDER — METFORMIN HCL 1000 MG PO TABS: 1000.0000 mg | ORAL_TABLET | Freq: Two times a day (BID) | ORAL | 1 refills | Status: DC

## 2016-04-05 MED ORDER — GLIPIZIDE 5 MG PO TABS: 7.5000 mg | ORAL_TABLET | Freq: Every day | ORAL | 1 refills | Status: DC

## 2016-04-05 NOTE — Progress Notes (Signed)
Name: Jason Farrell   MRN: [id number]    DOB: 06-12-1941   Date:04/05/2016       Progress Note  Subjective  Chief Complaint  Chief Complaint  Patient presents with  . Medication Refill  . Diabetes    Diabetes  He presents for his follow-up diabetic visit. He has type 2 diabetes mellitus. His disease course has been improving. There are no hypoglycemic associated symptoms. Pertinent negatives for hypoglycemia include no headaches, mood changes or nervousness/anxiousness. Pertinent negatives for diabetes include no blurred vision, no chest pain, no foot paresthesias, no polydipsia, no polyuria and no weight loss. Symptoms are stable. Pertinent negatives for diabetic complications include no CVA. Current diabetic treatment includes oral agent (triple therapy). His weight is stable. He is following a diabetic diet. He rarely participates in exercise. His breakfast blood glucose range is generally 140-180 mg/dl. An ACE inhibitor/angiotensin II receptor blocker is being taken.  Hyperlipidemia  This is a chronic problem. The problem is controlled. Exacerbating diseases include diabetes and obesity. Pertinent negatives include no chest pain, leg pain or shortness of breath. Current antihyperlipidemic treatment includes statins. Risk factors for coronary artery disease include dyslipidemia, diabetes mellitus, male sex and obesity.  Hypertension  This is a chronic problem. The problem is unchanged. The problem is controlled. Pertinent negatives include no blurred vision, chest pain, headaches, malaise/fatigue, palpitations or shortness of breath. Past treatments include beta blockers, diuretics, calcium channel blockers and ACE inhibitors. Hypertensive end-organ damage includes CAD/MI (Heart Attack in 1996 and 2006). There is no history of kidney disease or CVA.    Past Medical History:  Diagnosis Date  . Coronary artery disease   . Diabetes mellitus without complication ([place])   . Dyslipidemia   . GERD  (gastroesophageal reflux disease)   . Hyperlipidemia   . Hypertension   . MI (myocardial infarction) ([place])   . Osteoarthritis     Past Surgical History:  Procedure Laterality Date  . CARDIAC CATHETERIZATION  2006   stent placement  . COLONOSCOPY    . CORONARY ANGIOPLASTY WITH STENT PLACEMENT  2006   stent x 2   . CORONARY ANGIOPLASTY WITH STENT PLACEMENT  1996   stent x 3   . FOOT SURGERY Right   . HALLUX VALGUS CHEILECTOMY Right 02/25/2015   Procedure: HALLUX VALGUS CHEILECTOMY;  Surgeon: [name], DPM;  Location: [facility] ORS;  Service: Podiatry;  Laterality: Right;    Family History  Problem Relation Age of Onset  . Hyperlipidemia Father   . Hypertension Father   . Heart disease Sister   . Heart failure Sister   . Hypertension Sister   . Hyperlipidemia Sister   . Hypertension Sister     Social History   Social History  . Marital status: Married    Spouse name: N/A  . Number of children: N/A  . Years of education: N/A   Occupational History  . Not on file.   Social History Main Topics  . Smoking status: Former Smoker    Packs/day: 1.50    Years: 35.00    Types: Cigarettes    Quit date: 08/27/1994  . Smokeless tobacco: Not on file  . Alcohol use 1.2 oz/week    2 Glasses of wine per week  . Drug use: No  . Sexual activity: Not on file   Other Topics Concern  . Not on file   Social History Narrative  . No narrative on file     Current Outpatient Prescriptions:  .  amLODipine (  NORVASC) 10 MG tablet, TAKE 1 TABLET DAILY, Disp: 90 tablet, Rfl: 1 .  aspirin 81 MG tablet, Take 81 mg by mouth daily., Disp: , Rfl:  .  atorvastatin (LIPITOR) 40 MG tablet, TAKE 1 TABLET DAILY, Disp: 90 tablet, Rfl: 1 .  Coenzyme Q10 (CO Q10) 200 MG CAPS, Take 1 capsule by mouth daily., Disp: , Rfl:  .  FLUZONE HIGH-DOSE 0.5 ML SUSY, ADM 0.5ML IM UTD, Disp: , Rfl: 0 .  glipiZIDE (GLUCOTROL) 5 MG tablet, TAKE 1 TABLET DAILY, Disp: 90 tablet, Rfl: 1 .  glucose blood test  strip, Use as instructed, Disp: 100 each, Rfl: 12 .  hydrochlorothiazide (HYDRODIURIL) 25 MG tablet, TAKE 1 TABLET DAILY, Disp: 90 tablet, Rfl: 1 .  JANUVIA 100 MG tablet, TAKE 1 TABLET DAILY, Disp: 90 tablet, Rfl: 0 .  lisinopril (PRINIVIL,ZESTRIL) 20 MG tablet, TAKE 1 TABLET TWICE A DAY, Disp: 180 tablet, Rfl: 0 .  metFORMIN (GLUCOPHAGE) 1000 MG tablet, Take 1 tablet (1,000 mg total) by mouth 2 (two) times daily with a meal., Disp: 180 tablet, Rfl: 1 .  metoprolol succinate (TOPROL-XL) 100 MG 24 hr tablet, TAKE 1 TABLET DAILY,  WITH OR IMMEDIATELY FOLLOWING A MEAL, Disp: 90 tablet, Rfl: 0 .  Multiple Vitamins-Minerals (CENTRUM SILVER PO), Take by mouth daily., Disp: , Rfl:  .  Omega-3 Fatty Acids (FISH OIL) 1200 MG CAPS, Take 1,200 mg by mouth daily., Disp: , Rfl:  .  omeprazole (PRILOSEC) 40 MG capsule, TAKE 1 CAPSULE DAILY, Disp: 90 capsule, Rfl: 0 .  Vitamin D, Cholecalciferol, 1000 UNITS CAPS, Take 1,000 units of lipase by mouth daily., Disp: , Rfl:   No Known Allergies   Review of Systems  Constitutional: Negative for malaise/fatigue and weight loss.  Eyes: Negative for blurred vision.  Respiratory: Negative for shortness of breath.   Cardiovascular: Negative for chest pain and palpitations.  Neurological: Negative for headaches.  Endo/Heme/Allergies: Negative for polydipsia.  Psychiatric/Behavioral: The patient is not nervous/anxious.     Objective  Vitals:   04/05/16 0845  BP: 124/64  Pulse: 75  Resp: 16  Temp: 98.1 F (36.7 C)  SpO2: 92%  Weight: 232 lb (105.2 kg)  Height: 5\' 7"  (1.702 m)    Physical Exam  Constitutional: He is oriented to person, place, and time and well-developed, well-nourished, and in no distress.  HENT:  Head: Normocephalic and atraumatic.  Cardiovascular: Normal rate and regular rhythm.   Pulmonary/Chest: Effort normal and breath sounds normal.  Abdominal: Soft. Bowel sounds are normal.  Musculoskeletal:       Right ankle: He exhibits  swelling.       Left ankle: He exhibits no swelling.  Neurological: He is alert and oriented to person, place, and time.  Psychiatric: Mood, memory, affect and judgment normal.  Nursing note and vitals reviewed.     Assessment & Plan  1. Controlled type 2 diabetes mellitus without complication, without long-term current use of insulin (HCC) A1c at goal at 6.8%, no change in pharmacotherapy - POCT HgB A1C - POCT Glucose (CBG) - glipiZIDE (GLUCOTROL) 5 MG tablet; Take 1.5 tablets (7.5 mg total) by mouth daily.  Dispense: 135 tablet; Refill: 1 - metFORMIN (GLUCOPHAGE) 1000 MG tablet; Take 1 tablet (1,000 mg total) by mouth 2 (two) times daily with a meal.  Dispense: 180 tablet; Refill: 1  2. Essential hypertensio BP stable on present antihypertensive therapy  3. Hyperlipidemia Stable and at goal LDL of less than 70 mg/dL, repeat and adjust statin as  necessary - Lipid Profile - COMPLETE METABOLIC PANEL WITH GFR    Josefa Syracuse Asad A. [facility] 04/05/2016 9:45 AM

## 2016-04-06 ENCOUNTER — Other Ambulatory Visit: Payer: Self-pay | Admitting: Family Medicine

## 2016-04-06 MED ORDER — LISINOPRIL 20 MG PO TABS: 20.0000 mg | ORAL_TABLET | Freq: Two times a day (BID) | ORAL | 0 refills | Status: DC

## 2016-05-02 ENCOUNTER — Other Ambulatory Visit: Payer: Self-pay | Admitting: Family Medicine

## 2016-05-30 ENCOUNTER — Other Ambulatory Visit: Payer: Self-pay | Admitting: Family Medicine

## 2016-06-04 ENCOUNTER — Other Ambulatory Visit: Payer: Self-pay | Admitting: Family Medicine

## 2016-06-04 DIAGNOSIS — K219 Gastro-esophageal reflux disease without esophagitis: Secondary | ICD-10-CM

## 2016-06-04 NOTE — Telephone Encounter (Signed)
Medication has been refilled and sent to [facility] 

## 2016-07-05 ENCOUNTER — Ambulatory Visit: Payer: Medicare Other | Admitting: Family Medicine

## 2016-07-07 ENCOUNTER — Other Ambulatory Visit: Payer: Self-pay | Admitting: Family Medicine

## 2016-07-10 ENCOUNTER — Ambulatory Visit (INDEPENDENT_AMBULATORY_CARE_PROVIDER_SITE_OTHER): Payer: Medicare Other | Admitting: Family Medicine

## 2016-07-10 ENCOUNTER — Other Ambulatory Visit: Payer: Self-pay | Admitting: Family Medicine

## 2016-07-10 ENCOUNTER — Encounter: Payer: Self-pay | Admitting: Family Medicine

## 2016-07-10 VITALS — BP 130/68 | HR 71 | Temp 97.9°F | Resp 18 | Ht 67.0 in | Wt 233.4 lb

## 2016-07-10 DIAGNOSIS — I10 Essential (primary) hypertension: Secondary | ICD-10-CM | POA: Diagnosis not present

## 2016-07-10 DIAGNOSIS — I2510 Atherosclerotic heart disease of native coronary artery without angina pectoris: Secondary | ICD-10-CM | POA: Diagnosis not present

## 2016-07-10 DIAGNOSIS — E119 Type 2 diabetes mellitus without complications: Secondary | ICD-10-CM | POA: Diagnosis not present

## 2016-07-10 DIAGNOSIS — Z23 Encounter for immunization: Secondary | ICD-10-CM

## 2016-07-10 DIAGNOSIS — E785 Hyperlipidemia, unspecified: Secondary | ICD-10-CM

## 2016-07-10 LAB — LIPID PANEL
CHOL/HDL RATIO: 2.6 ratio (ref <5.0)
CHOLESTEROL: 127 mg/dL (ref <200)
HDL: 48 mg/dL (ref >40)
LDL Cholesterol: 52 mg/dL (ref <100)
Triglycerides: 137 mg/dL (ref <150)
VLDL: 27 mg/dL (ref <30)

## 2016-07-10 LAB — POCT GLYCOSYLATED HEMOGLOBIN (HGB A1C): HEMOGLOBIN A1C: 7

## 2016-07-10 LAB — GLUCOSE, POCT (MANUAL RESULT ENTRY): POC GLUCOSE: 139 mg/dL — AB (ref 70–99)

## 2016-07-10 MED ORDER — SITAGLIPTIN PHOSPHATE 100 MG PO TABS: 100.0000 mg | ORAL_TABLET | Freq: Every day | ORAL | 0 refills | Status: DC

## 2016-07-10 NOTE — Progress Notes (Signed)
Name: Jason Farrell   MRN: [id number]    DOB: 04/15/1941   Date:07/10/2016       Progress Note  Subjective  Chief Complaint  Chief Complaint  Patient presents with  . Diabetes    3 month follow up  . Hypertension  . Hyperlipidemia    Diabetes  He presents for his follow-up diabetic visit. He has type 2 diabetes mellitus. His disease course has been stable. There are no hypoglycemic associated symptoms. Pertinent negatives for hypoglycemia include no headaches. Pertinent negatives for diabetes include no blurred vision, no chest pain, no fatigue, no foot paresthesias, no polydipsia and no polyuria. Pertinent negatives for diabetic complications include no CVA. Current diabetic treatment includes oral agent (triple therapy). His breakfast blood glucose range is generally 140-180 mg/dl. An ACE inhibitor/angiotensin II receptor blocker is being taken.  Hypertension  This is a chronic problem. The problem is unchanged. Pertinent negatives include no blurred vision, chest pain, headaches, orthopnea, palpitations or shortness of breath. Past treatments include beta blockers, diuretics and ACE inhibitors. There is no history of kidney disease, CAD/MI or CVA.  Hyperlipidemia  This is a chronic problem. The problem is controlled. Recent lipid tests were reviewed and are normal. Pertinent negatives include no chest pain, leg pain, myalgias or shortness of breath. Current antihyperlipidemic treatment includes statins.     Past Medical History:  Diagnosis Date  . Coronary artery disease   . Diabetes mellitus without complication ([place])   . Dyslipidemia   . GERD (gastroesophageal reflux disease)   . Hyperlipidemia   . Hypertension   . MI (myocardial infarction)   . Osteoarthritis     Past Surgical History:  Procedure Laterality Date  . CARDIAC CATHETERIZATION  2006   stent placement  . COLONOSCOPY    . CORONARY ANGIOPLASTY WITH STENT PLACEMENT  2006   stent x 2   . CORONARY ANGIOPLASTY  WITH STENT PLACEMENT  1996   stent x 3   . FOOT SURGERY Right   . HALLUX VALGUS CHEILECTOMY Right 02/25/2015   Procedure: HALLUX VALGUS CHEILECTOMY;  Surgeon: [name], DPM;  Location: [facility] ORS;  Service: Podiatry;  Laterality: Right;    Family History  Problem Relation Age of Onset  . Hyperlipidemia Father   . Hypertension Father   . Heart disease Sister   . Heart failure Sister   . Hypertension Sister   . Hyperlipidemia Sister   . Hypertension Sister     Social History   Social History  . Marital status: Married    Spouse name: N/A  . Number of children: N/A  . Years of education: N/A   Occupational History  . Not on file.   Social History Main Topics  . Smoking status: Former Smoker    Packs/day: 1.50    Years: 35.00    Types: Cigarettes    Quit date: 08/27/1994  . Smokeless tobacco: Not on file  . Alcohol use 1.2 oz/week    2 Glasses of wine per week  . Drug use: No  . Sexual activity: Not on file   Other Topics Concern  . Not on file   Social History Narrative  . No narrative on file     Current Outpatient Prescriptions:  .  amLODipine (NORVASC) 10 MG tablet, TAKE 1 TABLET DAILY, Disp: 90 tablet, Rfl: 1 .  aspirin 81 MG tablet, Take 81 mg by mouth daily., Disp: , Rfl:  .  atorvastatin (LIPITOR) 40 MG tablet, TAKE 1 TABLET DAILY, Disp: 90  tablet, Rfl: 1 .  Coenzyme Q10 (CO Q10) 200 MG CAPS, Take 1 capsule by mouth daily., Disp: , Rfl:  .  FLUZONE HIGH-DOSE 0.5 ML SUSY, ADM 0.5ML IM UTD, Disp: , Rfl: 0 .  glipiZIDE (GLUCOTROL) 5 MG tablet, Take 1.5 tablets (7.5 mg total) by mouth daily., Disp: 135 tablet, Rfl: 1 .  glucose blood test strip, Use as instructed, Disp: 100 each, Rfl: 12 .  hydrochlorothiazide (HYDRODIURIL) 25 MG tablet, TAKE 1 TABLET DAILY, Disp: 90 tablet, Rfl: 1 .  JANUVIA 100 MG tablet, TAKE 1 TABLET DAILY, Disp: 90 tablet, Rfl: 0 .  lisinopril (PRINIVIL,ZESTRIL) 20 MG tablet, Take 1 tablet (20 mg total) by mouth 2 (two) times daily.,  Disp: 180 tablet, Rfl: 0 .  metFORMIN (GLUCOPHAGE) 1000 MG tablet, Take 1 tablet (1,000 mg total) by mouth 2 (two) times daily with a meal., Disp: 180 tablet, Rfl: 1 .  metoprolol succinate (TOPROL-XL) 100 MG 24 hr tablet, TAKE 1 TABLET DAILY,  WITH OR IMMEDIATELY FOLLOWING A MEAL, Disp: 90 tablet, Rfl: 0 .  Multiple Vitamins-Minerals (CENTRUM SILVER PO), Take by mouth daily., Disp: , Rfl:  .  Omega-3 Fatty Acids (FISH OIL) 1200 MG CAPS, Take 1,200 mg by mouth daily., Disp: , Rfl:  .  omeprazole (PRILOSEC) 40 MG capsule, TAKE 1 CAPSULE DAILY, Disp: 90 capsule, Rfl: 0 .  Vitamin D, Cholecalciferol, 1000 UNITS CAPS, Take 1,000 units of lipase by mouth daily., Disp: , Rfl:   No Known Allergies   Review of Systems  Constitutional: Negative for fatigue.  Eyes: Negative for blurred vision.  Respiratory: Negative for shortness of breath.   Cardiovascular: Negative for chest pain, palpitations and orthopnea.  Musculoskeletal: Negative for myalgias.  Neurological: Negative for headaches.  Endo/Heme/Allergies: Negative for polydipsia.     Objective  Vitals:   07/10/16 1024  BP: 130/68  Pulse: 71  Resp: 18  Temp: 97.9 F (36.6 C)  TempSrc: Oral  SpO2: 95%  Weight: 233 lb 6.4 oz (105.9 kg)  Height: 5\' 7"  (1.702 m)    Physical Exam  Constitutional: He is oriented to person, place, and time and well-developed, well-nourished, and in no distress.  HENT:  Head: Normocephalic and atraumatic.  Cardiovascular: Normal rate, regular rhythm, S1 normal, S2 normal and normal heart sounds.   No murmur heard. Pulmonary/Chest: Effort normal and breath sounds normal. He has no wheezes.  Abdominal: Soft. Bowel sounds are normal. There is no tenderness.  Musculoskeletal:       Right ankle: He exhibits swelling.       Left ankle: He exhibits swelling.  2+ pitting edema bilateral lower extremities.  Neurological: He is alert and oriented to person, place, and time.  Psychiatric: Mood, memory,  affect and judgment normal.  Nursing note and vitals reviewed.     Assessment & Plan  1. Controlled type 2 diabetes mellitus without complication, without long-term current use of insulin (HCC) Point-of-care A1c is 7.0%, glucose is 13 9 mg/dL, well-controlled diabetes. No change in therapy - POCT HgB A1C - POCT Glucose (CBG) - sitaGLIPtin (JANUVIA) 100 MG tablet; Take 1 tablet (100 mg total) by mouth daily.  Dispense: 90 tablet; Refill: 0  2. Hyperlipidemia, unspecified hyperlipidemia type Obtain FLP, continue on statin - Lipid Profile  3. Essential hypertension BP stable and controlled on present antihypertensive therapy  4. Need for pneumococcal vaccination  - Pneumococcal conjugate vaccine 13-valent  Essica Kiker Asad A. [facility] 07/10/2016 10:32 AM

## 2016-08-03 ENCOUNTER — Ambulatory Visit: Payer: Managed Care, Other (non HMO) | Admitting: Cardiovascular Disease

## 2016-08-10 ENCOUNTER — Encounter: Payer: Self-pay | Admitting: Cardiovascular Disease

## 2016-08-10 ENCOUNTER — Ambulatory Visit (INDEPENDENT_AMBULATORY_CARE_PROVIDER_SITE_OTHER): Payer: Medicare Other | Admitting: Cardiovascular Disease

## 2016-08-10 VITALS — BP 130/70 | HR 64 | Ht 66.0 in | Wt 236.2 lb

## 2016-08-10 DIAGNOSIS — E782 Mixed hyperlipidemia: Secondary | ICD-10-CM | POA: Diagnosis not present

## 2016-08-10 DIAGNOSIS — I2510 Atherosclerotic heart disease of native coronary artery without angina pectoris: Secondary | ICD-10-CM | POA: Diagnosis not present

## 2016-08-10 DIAGNOSIS — E119 Type 2 diabetes mellitus without complications: Secondary | ICD-10-CM

## 2016-08-10 DIAGNOSIS — I10 Essential (primary) hypertension: Secondary | ICD-10-CM | POA: Diagnosis not present

## 2016-08-10 DIAGNOSIS — G4733 Obstructive sleep apnea (adult) (pediatric): Secondary | ICD-10-CM

## 2016-08-10 DIAGNOSIS — Z9989 Dependence on other enabling machines and devices: Secondary | ICD-10-CM

## 2016-08-10 DIAGNOSIS — E6609 Other obesity due to excess calories: Secondary | ICD-10-CM

## 2016-08-10 DIAGNOSIS — J432 Centrilobular emphysema: Secondary | ICD-10-CM

## 2016-08-10 NOTE — Patient Instructions (Signed)

## 2016-08-10 NOTE — Progress Notes (Signed)
Cardiology Office Note  Date:  08/10/2016   ID:  Jason Farrell, DOB [birth year], MRN [id number]  PCP:  Jason Rake, MD   Chief Complaint  Patient presents with  . other    12 month follow up. Meds reviewed by the pt. verbally. 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  In follow-up today, he reports that he is doing well. Reports he was recently raking leaves, no chest pain or arm pain concerning for angina Previous anginal symptoms included arm pain. None recently Previously reported having bilateral hip pain, unable to exercise on a regular basis  Sister with lung CA he is scared about lung cancer in himself Was told he could not do screening CT scan of lungs, Was told that he stop smoking too many years ago despite smoking 38 years   PMD changing glipizide dosing was on 2, now on 1.5  Hemoglobin A1c 7, numbers reviewed with him Also total cholesterol 123 tolerating Lipitor 40 mg daily  EKG on today's visit shows normal sinus rhythm with rate 64 bpm, right bundle branch block  Other past medical history He stopped smoking in 1996 after his MI . Smoked 38 yrs   PMH:   has a past medical history of Coronary artery disease; Diabetes mellitus without complication ([place]); Dyslipidemia; GERD (gastroesophageal reflux disease); Hyperlipidemia; Hypertension; MI (myocardial infarction); and Osteoarthritis.  PSH:    Past Surgical History:  Procedure Laterality Date  . CARDIAC CATHETERIZATION  2006   stent placement  . COLONOSCOPY    . CORONARY ANGIOPLASTY WITH STENT PLACEMENT  2006   stent x 2   . CORONARY ANGIOPLASTY WITH STENT PLACEMENT  1996   stent x 3   . FOOT SURGERY Right   . HALLUX VALGUS CHEILECTOMY Right 02/25/2015    Procedure: HALLUX VALGUS CHEILECTOMY;  Surgeon: Jason Farrell, DPM;  Location: [facility] ORS;  Service: Podiatry;  Laterality: Right;    Current Outpatient Prescriptions  Medication Sig Dispense Refill  . amLODipine (NORVASC) 10 MG tablet TAKE 1 TABLET DAILY 90 tablet 1  . aspirin 81 MG tablet Take 81 mg by mouth daily.    Marland Kitchen atorvastatin (LIPITOR) 40 MG tablet TAKE 1 TABLET DAILY 90 tablet 1  . Coenzyme Q10 (CO Q10) 200 MG CAPS Take 1 capsule by mouth daily.    Marland Kitchen FLUZONE HIGH-DOSE 0.5 ML SUSY ADM 0.5ML IM UTD  0  . glipiZIDE (GLUCOTROL) 5 MG tablet Take 1.5 tablets (7.5 mg total) by mouth daily. 135 tablet 1  . glucose blood test strip Use as instructed 100 each 12  . hydrochlorothiazide (HYDRODIURIL) 25 MG tablet TAKE 1 TABLET DAILY 90 tablet 1  . lisinopril (PRINIVIL,ZESTRIL) 20 MG tablet TAKE 1 TABLET TWICE A DAY 180 tablet 0  . metFORMIN (GLUCOPHAGE) 1000 MG tablet Take 1 tablet (1,000 mg total) by mouth 2 (two) times daily with a meal. 180 tablet 1  . metoprolol succinate (TOPROL-XL) 100 MG 24 hr tablet TAKE 1 TABLET DAILY,  WITH OR IMMEDIATELY FOLLOWING A MEAL 90 tablet 0  . Multiple Vitamins-Minerals (CENTRUM SILVER PO) Take by mouth daily.    . Omega-3 Fatty Acids (FISH OIL) 1200 MG CAPS Take 1,200 mg by mouth daily.    Marland Kitchen omeprazole (PRILOSEC) 40 MG capsule  TAKE 1 CAPSULE DAILY 90 capsule 0  . sitaGLIPtin (JANUVIA) 100 MG tablet Take 1 tablet (100 mg total) by mouth daily. 90 tablet 0  . Vitamin D, Cholecalciferol, 1000 UNITS CAPS Take 1,000 units of lipase by mouth daily.     No current facility-administered medications for this visit.      Allergies:   Patient has no known allergies.   Social History:  The patient  reports that he quit smoking about 21 years ago. His smoking use included Cigarettes. He has a 52.50 pack-year smoking history. He has never used smokeless tobacco. He reports that he drinks about 1.2 oz of alcohol per week . He reports that he does not use drugs.    Family History:   family history includes Heart disease in his sister; Heart failure in his sister; Hyperlipidemia in his father and sister; Hypertension in his father, sister, and sister.    Review of Systems: Review of Systems  Constitutional: Negative.   Respiratory: Negative.   Cardiovascular: Negative.   Gastrointestinal: Negative.   Musculoskeletal: Positive for joint pain.  Neurological: Negative.   Psychiatric/Behavioral: Negative.   All other systems reviewed and are negative.    PHYSICAL EXAM: VS:  BP 130/70 (BP Location: Left Arm, Patient Position: Sitting, Cuff Size: Normal)   Pulse 64   Ht 5\' 6"  (1.676 m)   Wt 236 lb 4 oz (107.2 kg)   BMI 38.13 kg/m  , BMI Body mass index is 38.13 kg/m. GEN: Well nourished, well developed, in no acute distress  HEENT: normal  Neck: no JVD, carotid bruits, or masses Cardiac: RRR; no murmurs, rubs, or gallops,no edema  Respiratory:  clear to auscultation bilaterally, normal work of breathing GI: soft, nontender, nondistended, + BS MS: no deformity or atrophy  Skin: warm and dry, no rash Neuro:  Strength and sensation are intact Psych: euthymic mood, full affect    Recent Labs: 12/09/2015: TSH 0.794 04/05/2016: ALT 16; BUN 17; Creat 0.88; Potassium 4.6; Sodium 139    Lipid Panel Lab Results  Component Value Date   CHOL 127 07/10/2016   HDL 48 07/10/2016   LDLCALC 52 07/10/2016   TRIG 137 07/10/2016      Wt Readings from Last 3 Encounters:  08/10/16 236 lb 4 oz (107.2 kg)  07/10/16 233 lb 6.4 oz (105.9 kg)  04/05/16 232 lb (105.2 kg)       ASSESSMENT AND PLAN:  Coronary artery disease involving native coronary artery of native heart without angina pectoris - Plan: EKG 12-Lead Currently with no symptoms of angina. No further workup at this time. Continue current medication regimen.did some education concerning anginal symptoms to watch for  Essential hypertension - Plan: EKG 12-Lead Blood pressure is well  controlled on today's visit. No changes made to the medications.  Mixed hyperlipidemia Cholesterol is at goal on the current lipid regimen. No changes to the medications were made.  Controlled type 2 diabetes mellitus without complication, without long-term current use of insulin (HCC) Hemoglobin A1c has climbed up to 7 Likely secondary to dietary no discretion, changing his glipizide dosing Recommended low carbohydrate diet  Class 2 obesity due to excess calories without serious comorbidity in adult, unspecified BMI We have encouraged continued exercise, careful diet management in an effort to lose weight.  Centrilobular emphysema ([place]) Reports he is doing well, does not use oxygen 38 years of smoking We will research requirements for screening lung exam with CT He is very concerned given sister now with lung  cancer  Obstructive sleep apnea on CPAP Stable, on CPAP   Total encounter time more than 25 minutes  Greater than 50% was spent in counseling and coordination of care with the patient   Disposition:   F/U  12 months   Orders Placed This Encounter  Procedures  . EKG 12-Lead     Signed, Esmond Plants, M.D., Ph.D. 08/10/2016  [place], [place]

## 2016-08-30 ENCOUNTER — Other Ambulatory Visit: Payer: Self-pay | Admitting: Family Medicine

## 2016-08-30 DIAGNOSIS — K219 Gastro-esophageal reflux disease without esophagitis: Secondary | ICD-10-CM

## 2016-10-08 ENCOUNTER — Ambulatory Visit: Payer: Medicare Other | Admitting: Family Medicine

## 2016-10-08 ENCOUNTER — Other Ambulatory Visit: Payer: Self-pay | Admitting: Family Medicine

## 2016-10-08 ENCOUNTER — Telehealth: Payer: Self-pay

## 2016-10-08 NOTE — Telephone Encounter (Signed)
Called pt to schedule AWV, left message, anr

## 2016-10-11 ENCOUNTER — Ambulatory Visit (INDEPENDENT_AMBULATORY_CARE_PROVIDER_SITE_OTHER): Payer: Medicare Other | Admitting: Family Medicine

## 2016-10-11 ENCOUNTER — Encounter: Payer: Self-pay | Admitting: Family Medicine

## 2016-10-11 VITALS — BP 132/70 | HR 68 | Temp 97.6°F | Resp 16 | Ht 66.0 in | Wt 226.2 lb

## 2016-10-11 DIAGNOSIS — I2510 Atherosclerotic heart disease of native coronary artery without angina pectoris: Secondary | ICD-10-CM

## 2016-10-11 DIAGNOSIS — I10 Essential (primary) hypertension: Secondary | ICD-10-CM

## 2016-10-11 DIAGNOSIS — E7800 Pure hypercholesterolemia, unspecified: Secondary | ICD-10-CM

## 2016-10-11 DIAGNOSIS — E119 Type 2 diabetes mellitus without complications: Secondary | ICD-10-CM

## 2016-10-11 LAB — GLUCOSE, POCT (MANUAL RESULT ENTRY): POC Glucose: 134 mg/dl — AB (ref 70–99)

## 2016-10-11 LAB — LIPID PANEL
Cholesterol: 125 mg/dL (ref <200)
HDL: 45 mg/dL (ref >40)
LDL Cholesterol: 56 mg/dL (ref <100)
TRIGLYCERIDES: 122 mg/dL (ref <150)
Total CHOL/HDL Ratio: 2.8 Ratio (ref <5.0)
VLDL: 24 mg/dL (ref <30)

## 2016-10-11 LAB — POCT GLYCOSYLATED HEMOGLOBIN (HGB A1C): Hemoglobin A1C: 6.6

## 2016-10-11 MED ORDER — LISINOPRIL 20 MG PO TABS: 20.0000 mg | ORAL_TABLET | Freq: Two times a day (BID) | ORAL | 1 refills | Status: DC

## 2016-10-11 NOTE — Progress Notes (Signed)
Name: Jason Farrell   MRN: [id number]    DOB: 1941-07-07   Date:10/11/2016       Progress Note  Subjective  Chief Complaint  Chief Complaint  Patient presents with  . Follow-up    3 mo  . Medication Refill    Diabetes  He presents for his follow-up diabetic visit. He has type 2 diabetes mellitus. His disease course has been improving. There are no hypoglycemic associated symptoms. Pertinent negatives for hypoglycemia include no headaches. Pertinent negatives for diabetes include no blurred vision, no chest pain, no foot paresthesias, no polydipsia and no polyuria. Pertinent negatives for diabetic complications include no CVA. Risk factors for coronary artery disease include dyslipidemia and obesity. Current diabetic treatment includes oral agent (triple therapy). His weight is stable. He is following a diabetic diet. His breakfast blood glucose range is generally 140-180 mg/dl. An ACE inhibitor/angiotensin II receptor blocker is being taken.  Hypertension  This is a chronic problem. The problem is unchanged. The problem is controlled. Pertinent negatives include no blurred vision, chest pain, headaches, orthopnea, palpitations or shortness of breath. Past treatments include beta blockers, diuretics and ACE inhibitors. Hypertensive end-organ damage includes CAD/MI (PCI in 1996 and 2006). There is no history of kidney disease or CVA.  Hyperlipidemia  This is a chronic problem. The problem is controlled. Recent lipid tests were reviewed and are normal. Pertinent negatives include no chest pain, leg pain, myalgias or shortness of breath. Current antihyperlipidemic treatment includes statins.    Past Medical History:  Diagnosis Date  . Coronary artery disease   . Diabetes mellitus without complication ([place])   . Dyslipidemia   . GERD (gastroesophageal reflux disease)   . Hyperlipidemia   . Hypertension   . MI (myocardial infarction)   . Osteoarthritis     Past Surgical History:  Procedure  Laterality Date  . CARDIAC CATHETERIZATION  2006   stent placement  . COLONOSCOPY    . CORONARY ANGIOPLASTY WITH STENT PLACEMENT  2006   stent x 2   . CORONARY ANGIOPLASTY WITH STENT PLACEMENT  1996   stent x 3   . FOOT SURGERY Right   . HALLUX VALGUS CHEILECTOMY Right 02/25/2015   Procedure: HALLUX VALGUS CHEILECTOMY;  Surgeon: [name], DPM;  Location: [facility] ORS;  Service: Podiatry;  Laterality: Right;    Family History  Problem Relation Age of Onset  . Hyperlipidemia Father   . Hypertension Father   . Heart disease Sister   . Heart failure Sister   . Hypertension Sister   . Hyperlipidemia Sister   . Hypertension Sister     Social History   Social History  . Marital status: Married    Spouse name: N/A  . Number of children: N/A  . Years of education: N/A   Occupational History  . Not on file.   Social History Main Topics  . Smoking status: Former Smoker    Packs/day: 1.50    Years: 35.00    Types: Cigarettes    Quit date: 08/27/1994  . Smokeless tobacco: Never Used  . Alcohol use 1.2 oz/week    2 Glasses of wine per week  . Drug use: No  . Sexual activity: Not on file   Other Topics Concern  . Not on file   Social History Narrative  . No narrative on file     Current Outpatient Prescriptions:  .  amLODipine (NORVASC) 10 MG tablet, TAKE 1 TABLET DAILY, Disp: 90 tablet, Rfl: 1 .  aspirin 81  MG tablet, Take 81 mg by mouth daily., Disp: , Rfl:  .  atorvastatin (LIPITOR) 40 MG tablet, TAKE 1 TABLET DAILY, Disp: 90 tablet, Rfl: 1 .  Coenzyme Q10 (CO Q10) 200 MG CAPS, Take 1 capsule by mouth daily., Disp: , Rfl:  .  FLUZONE HIGH-DOSE 0.5 ML SUSY, ADM 0.5ML IM UTD, Disp: , Rfl: 0 .  glipiZIDE (GLUCOTROL) 5 MG tablet, Take 1.5 tablets (7.5 mg total) by mouth daily., Disp: 135 tablet, Rfl: 1 .  glucose blood test strip, Use as instructed, Disp: 100 each, Rfl: 12 .  hydrochlorothiazide (HYDRODIURIL) 25 MG tablet, TAKE 1 TABLET DAILY, Disp: 90 tablet, Rfl:  1 .  lisinopril (PRINIVIL,ZESTRIL) 20 MG tablet, TAKE 1 TABLET TWICE A DAY, Disp: 180 tablet, Rfl: 0 .  metFORMIN (GLUCOPHAGE) 1000 MG tablet, Take 1 tablet (1,000 mg total) by mouth 2 (two) times daily with a meal., Disp: 180 tablet, Rfl: 1 .  metoprolol succinate (TOPROL-XL) 100 MG 24 hr tablet, TAKE 1 TABLET DAILY,  WITH OR IMMEDIATELY FOLLOWING A MEAL, Disp: 90 tablet, Rfl: 0 .  Multiple Vitamins-Minerals (CENTRUM SILVER PO), Take by mouth daily., Disp: , Rfl:  .  Omega-3 Fatty Acids (FISH OIL) 1200 MG CAPS, Take 1,200 mg by mouth daily., Disp: , Rfl:  .  omeprazole (PRILOSEC) 40 MG capsule, TAKE 1 CAPSULE DAILY, Disp: 90 capsule, Rfl: 0 .  sitaGLIPtin (JANUVIA) 100 MG tablet, Take 1 tablet (100 mg total) by mouth daily., Disp: 90 tablet, Rfl: 0 .  Vitamin D, Cholecalciferol, 1000 UNITS CAPS, Take 1,000 units of lipase by mouth daily., Disp: , Rfl:   No Known Allergies   Review of Systems  Eyes: Negative for blurred vision.  Respiratory: Negative for shortness of breath.   Cardiovascular: Negative for chest pain, palpitations and orthopnea.  Musculoskeletal: Negative for myalgias.  Neurological: Negative for headaches.  Endo/Heme/Allergies: Negative for polydipsia.     Objective  Vitals:   10/11/16 1112  BP: 132/70  Pulse: 68  Resp: 16  Temp: 97.6 F (36.4 C)  TempSrc: Oral  SpO2: 96%  Weight: 226 lb 3.2 oz (102.6 kg)  Height: 5\' 6"  (1.676 m)    Physical Exam  Constitutional: He is oriented to person, place, and time and well-developed, well-nourished, and in no distress.  HENT:  Head: Normocephalic and atraumatic.  Cardiovascular: Normal rate, regular rhythm and normal heart sounds.   No murmur heard. Pulmonary/Chest: Effort normal and breath sounds normal. He has no wheezes.  Abdominal: Soft. Bowel sounds are normal. He exhibits no distension. There is no tenderness.  Musculoskeletal: He exhibits edema.  Neurological: He is alert and oriented to person, place, and  time.  Psychiatric: Mood, memory, affect and judgment normal.  Nursing note and vitals reviewed.      Recent Results (from the past 2160 hour(s))  POCT Glucose (CBG)     Status: Abnormal   Collection Time: 10/11/16 11:21 AM  Result Value Ref Range   POC Glucose 134 (A) 70 - 99 mg/dl  POCT HgB A1C     Status: Abnormal   Collection Time: 10/11/16 11:22 AM  Result Value Ref Range   Hemoglobin A1C 6.6      Assessment & Plan  1. Controlled type 2 diabetes mellitus without complication, without long-term current use of insulin (HCC)   A1c is 6.6%, well-controlled diabetes, advised to decrease glipizide to 5 mg daily, continue on metformin unchanged. Obtain urine microalbumin. - POCT HgB A1C - POCT Glucose (CBG) - Urine Microalbumin  w/creat. ratio  2. Pure hypercholesterolemia Goal LDL is less than 70 MG per DL, continue on statin - Lipid panel  3. Essential hypertension  - lisinopril (PRINIVIL,ZESTRIL) 20 MG tablet; Take 1 tablet (20 mg total) by mouth 2 (two) times daily.  Dispense: 180 tablet; Refill: 1   Khyrin Trevathan Asad A. [facility] 10/11/2016 11:29 AM

## 2016-10-12 LAB — MICROALBUMIN / CREATININE URINE RATIO
Creatinine, Urine: 197 mg/dL (ref 20–370)
MICROALB UR: 2.2 mg/dL
MICROALB/CREAT RATIO: 11 ug/mg{creat} (ref <30)

## 2016-10-25 ENCOUNTER — Other Ambulatory Visit: Payer: Self-pay | Admitting: Family Medicine

## 2016-10-25 DIAGNOSIS — E119 Type 2 diabetes mellitus without complications: Secondary | ICD-10-CM

## 2016-11-19 ENCOUNTER — Other Ambulatory Visit: Payer: Self-pay | Admitting: Family Medicine

## 2016-11-19 DIAGNOSIS — E119 Type 2 diabetes mellitus without complications: Secondary | ICD-10-CM

## 2016-12-03 ENCOUNTER — Ambulatory Visit (INDEPENDENT_AMBULATORY_CARE_PROVIDER_SITE_OTHER): Payer: Medicare Other

## 2016-12-03 VITALS — BP 132/74 | HR 64 | Temp 97.5°F | Ht 66.0 in | Wt 228.4 lb

## 2016-12-03 DIAGNOSIS — Z0000 Encounter for general adult medical examination without abnormal findings: Secondary | ICD-10-CM | POA: Diagnosis not present

## 2016-12-03 NOTE — Patient Instructions (Signed)
Jason Farrell , Thank you for taking time to come for your Medicare Wellness Visit. I appreciate your ongoing commitment to your health goals. Please review the following plan we discussed and let me know if I can assist you in the future.   Screening recommendations/referrals: Colonoscopy: completed 07/16/13, due 07/2023 Recommended yearly ophthalmology/optometry visit for glaucoma screening and checkup Recommended yearly dental visit for hygiene and checkup  Vaccinations: Influenza vaccine: up to date, due 03/2017 Pneumococcal vaccine: completed series Tdap vaccine: received in 2013 (per patient), due 2023 Shingles vaccine: received per patient    Advanced directives: Declined set up today. Pt advised to contact office when ready to set this up.  Conditions/risks identified: Recommend increasing water intake to 4 glasses a day.  Next appointment: 01/11/17 @ 9:20 AM  Preventive Care 65 Years and Older, Male Preventive care refers to lifestyle choices and visits with your health care provider that can promote health and wellness. What does preventive care include?  A yearly physical exam. This is also called an annual well check.  Dental exams once or twice a year.  Routine eye exams. Ask your health care provider how often you should have your eyes checked.  Personal lifestyle choices, including:  Daily care of your teeth and gums.  Regular physical activity.  Eating a healthy diet.  Avoiding tobacco and drug use.  Limiting alcohol use.  Practicing safe sex.  Taking low doses of aspirin every day.  Taking vitamin and mineral supplements as recommended by your health care provider. What happens during an annual well check? The services and screenings done by your health care provider during your annual well check will depend on your age, overall health, lifestyle risk factors, and family history of disease. Counseling  Your health care provider may ask you questions  about your:  Alcohol use.  Tobacco use.  Drug use.  Emotional well-being.  Home and relationship well-being.  Sexual activity.  Eating habits.  History of falls.  Memory and ability to understand (cognition).  Work and work Statistician. Screening  You may have the following tests or measurements:  Height, weight, and BMI.  Blood pressure.  Lipid and cholesterol levels. These may be checked every 5 years, or more frequently if you are over 79 years old.  Skin check.  Lung cancer screening. You may have this screening every year starting at age 83 if you have a 30-pack-year history of smoking and currently smoke or have quit within the past 15 years.  Fecal occult blood test (FOBT) of the stool. You may have this test every year starting at age 21.  Flexible sigmoidoscopy or colonoscopy. You may have a sigmoidoscopy every 5 years or a colonoscopy every 10 years starting at age 1.  Prostate cancer screening. Recommendations will vary depending on your family history and other risks.  Hepatitis C blood test.  Hepatitis B blood test.  Sexually transmitted disease (STD) testing.  Diabetes screening. This is done by checking your blood sugar (glucose) after you have not eaten for a while (fasting). You may have this done every 1-3 years.  Abdominal aortic aneurysm (AAA) screening. You may need this if you are a current or former smoker.  Osteoporosis. You may be screened starting at age 21 if you are at high risk. Talk with your health care provider about your test results, treatment options, and if necessary, the need for more tests. Vaccines  Your health care provider may recommend certain vaccines, such as:  Influenza vaccine.  This is recommended every year.  Tetanus, diphtheria, and acellular pertussis (Tdap, Td) vaccine. You may need a Td booster every 10 years.  Zoster vaccine. You may need this after age 84.  Pneumococcal 13-valent conjugate (PCV13)  vaccine. One dose is recommended after age 64.  Pneumococcal polysaccharide (PPSV23) vaccine. One dose is recommended after age 26. Talk to your health care provider about which screenings and vaccines you need and how often you need them. This information is not intended to replace advice given to you by your health care provider. Make sure you discuss any questions you have with your health care provider. Document Released: 07/29/2015 Document Revised: 03/21/2016 Document Reviewed: 05/03/2015 Elsevier Interactive Patient Education  2017 [place] Prevention in the Home Falls can cause injuries. They can happen to people of all ages. There are many things you can do to make your home safe and to help prevent falls. What can I do on the outside of my home?  Regularly fix the edges of walkways and driveways and fix any cracks.  Remove anything that might make you trip as you walk through a door, such as a raised step or threshold.  Trim any bushes or trees on the path to your home.  Use bright outdoor lighting.  Clear any walking paths of anything that might make someone trip, such as rocks or tools.  Regularly check to see if handrails are loose or broken. Make sure that both sides of any steps have handrails.  Any raised decks and porches should have guardrails on the edges.  Have any leaves, snow, or ice cleared regularly.  Use sand or salt on walking paths during winter.  Clean up any spills in your garage right away. This includes oil or grease spills. What can I do in the bathroom?  Use night lights.  Install grab bars by the toilet and in the tub and shower. Do not use towel bars as grab bars.  Use non-skid mats or decals in the tub or shower.  If you need to sit down in the shower, use a plastic, non-slip stool.  Keep the floor dry. Clean up any water that spills on the floor as soon as it happens.  Remove soap buildup in the tub or shower  regularly.  Attach bath mats securely with double-sided non-slip rug tape.  Do not have throw rugs and other things on the floor that can make you trip. What can I do in the bedroom?  Use night lights.  Make sure that you have a light by your bed that is easy to reach.  Do not use any sheets or blankets that are too big for your bed. They should not hang down onto the floor.  Have a firm chair that has side arms. You can use this for support while you get dressed.  Do not have throw rugs and other things on the floor that can make you trip. What can I do in the kitchen?  Clean up any spills right away.  Avoid walking on wet floors.  Keep items that you use a lot in easy-to-reach places.  If you need to reach something above you, use a strong step stool that has a grab bar.  Keep electrical cords out of the way.  Do not use floor polish or wax that makes floors slippery. If you must use wax, use non-skid floor wax.  Do not have throw rugs and other things on the floor that can make  you trip. What can I do with my stairs?  Do not leave any items on the stairs.  Make sure that there are handrails on both sides of the stairs and use them. Fix handrails that are broken or loose. Make sure that handrails are as long as the stairways.  Check any carpeting to make sure that it is firmly attached to the stairs. Fix any carpet that is loose or worn.  Avoid having throw rugs at the top or bottom of the stairs. If you do have throw rugs, attach them to the floor with carpet tape.  Make sure that you have a light switch at the top of the stairs and the bottom of the stairs. If you do not have them, ask someone to add them for you. What else can I do to help prevent falls?  Wear shoes that:  Do not have high heels.  Have rubber bottoms.  Are comfortable and fit you well.  Are closed at the toe. Do not wear sandals.  If you use a stepladder:  Make sure that it is fully  opened. Do not climb a closed stepladder.  Make sure that both sides of the stepladder are locked into place.  Ask someone to hold it for you, if possible.  Clearly mark and make sure that you can see:  Any grab bars or handrails.  First and last steps.  Where the edge of each step is.  Use tools that help you move around (mobility aids) if they are needed. These include:  Canes.  Walkers.  Scooters.  Crutches.  Turn on the lights when you go into a dark area. Replace any light bulbs as soon as they burn out.  Set up your furniture so you have a clear path. Avoid moving your furniture around.  If any of your floors are uneven, fix them.  If there are any pets around you, be aware of where they are.  Review your medicines with your doctor. Some medicines can make you feel dizzy. This can increase your chance of falling. Ask your doctor what other things that you can do to help prevent falls. This information is not intended to replace advice given to you by your health care provider. Make sure you discuss any questions you have with your health care provider. Document Released: 04/28/2009 Document Revised: 12/08/2015 Document Reviewed: 08/06/2014 Elsevier Interactive Patient Education  2017 [employer].

## 2016-12-03 NOTE — Progress Notes (Signed)
Subjective:   Jason Farrell is a 76 y.o. male who presents for an Initial Medicare Annual Wellness Visit.  Review of Systems  N/A  Cardiac Risk Factors include: advanced age (>20men, >34 women);diabetes mellitus;dyslipidemia;hypertension;obesity (BMI >30kg/m2);male gender    Objective:    Today's Vitals   12/03/16 0958  BP: 132/74  Pulse: 64  Temp: 97.5 F (36.4 C)  TempSrc: Oral  Weight: 228 lb 6.4 oz (103.6 kg)  Height: 5\' 6"  (1.676 m)  PainSc: 0-No pain   Body mass index is 36.86 kg/m.  Current Medications (verified) Outpatient Encounter Prescriptions as of 12/03/2016  Medication Sig  . amLODipine (NORVASC) 10 MG tablet TAKE 1 TABLET DAILY  . aspirin 81 MG tablet Take 81 mg by mouth daily.  Marland Kitchen atorvastatin (LIPITOR) 40 MG tablet TAKE 1 TABLET DAILY  . Coenzyme Q10 (CO Q10) 200 MG CAPS Take 1 capsule by mouth daily.  Marland Kitchen FLUZONE HIGH-DOSE 0.5 ML SUSY ADM 0.5ML IM UTD  . glipiZIDE (GLUCOTROL) 5 MG tablet Take 1 tablet (5 mg total) by mouth daily.  Marland Kitchen glucose blood test strip Use as instructed  . hydrochlorothiazide (HYDRODIURIL) 25 MG tablet TAKE 1 TABLET DAILY  . JANUVIA 100 MG tablet TAKE 1 TABLET DAILY  . lisinopril (PRINIVIL,ZESTRIL) 20 MG tablet Take 1 tablet (20 mg total) by mouth 2 (two) times daily.  . metFORMIN (GLUCOPHAGE) 1000 MG tablet Take 1 tablet (1,000 mg total) by mouth 2 (two) times daily with a meal.  . metoprolol succinate (TOPROL-XL) 100 MG 24 hr tablet TAKE 1 TABLET DAILY,  WITH OR IMMEDIATELY FOLLOWING A MEAL  . Multiple Vitamins-Minerals (CENTRUM SILVER PO) Take by mouth daily.  . naproxen sodium (ANAPROX) 220 MG tablet Take 220 mg by mouth 2 (two) times daily with a meal.  . Omega-3 Fatty Acids (FISH OIL) 1200 MG CAPS Take 1,200 mg by mouth daily.  Marland Kitchen omeprazole (PRILOSEC) 40 MG capsule TAKE 1 CAPSULE DAILY  . Vitamin D, Cholecalciferol, 1000 UNITS CAPS Take 1,000 units of lipase by mouth daily.   No facility-administered encounter medications on  file as of 12/03/2016.     Allergies (verified) Patient has no known allergies.   History: Past Medical History:  Diagnosis Date  . Coronary artery disease   . Diabetes mellitus without complication ([place])   . Dyslipidemia   . GERD (gastroesophageal reflux disease)   . Hyperlipidemia   . Hypertension   . MI (myocardial infarction) ([place])   . Osteoarthritis    Past Surgical History:  Procedure Laterality Date  . CARDIAC CATHETERIZATION  2006   stent placement  . COLONOSCOPY    . CORONARY ANGIOPLASTY WITH STENT PLACEMENT  2006   stent x 2   . CORONARY ANGIOPLASTY WITH STENT PLACEMENT  1996   stent x 3   . FOOT SURGERY Right   . 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 Hyperlipidemia Father   . Hypertension Father   . Heart disease Sister   . Heart failure Sister   . Hypertension Sister   . Hyperlipidemia Sister   . Hypertension Sister    Social History   Occupational History  . Not on file.   Social History Main Topics  . Smoking status: Former Smoker    Packs/day: 1.50    Years: 35.00    Types: Cigarettes  Quit date: 08/27/1994  . Smokeless tobacco: Never Used  . Alcohol use 8.4 oz/week    14 Glasses of wine per week  . Drug use: No  . Sexual activity: Not on file   Tobacco Counseling Counseling given: Not Answered   Activities of Daily Living In your present state of health, do you have any difficulty performing the following activities: 12/03/2016 10/11/2016  Hearing? Tempie Donning  Vision? N Y  Difficulty concentrating or making decisions? N N  Walking or climbing stairs? Y Y  Dressing or bathing? N N  Doing errands, shopping? N N  Preparing Food and eating ? N -  Using the Toilet? N -  In the past six months, have you accidently leaked urine? N -  Do you have problems with  loss of bowel control? N -  Managing your Medications? N -  Managing your Finances? N -  Housekeeping or managing your Housekeeping? N -  Some recent data might be hidden    Immunizations and Health Maintenance Immunization History  Administered Date(s) Administered  . Influenza, Seasonal, Injecte, Preservative Fre 05/10/2014  . Influenza-Unspecified 04/15/2015, 03/21/2016  . Pneumococcal Conjugate-13 07/10/2016  . Pneumococcal Polysaccharide-23 07/22/2009   There are no preventive care reminders to display for this patient.  Patient Care Team: Roselee Nova, MD as PCP - General (Family Medicine) Rockey Situ Kathlene November, MD as Consulting Physician (Cardiology)  Indicate any recent Medical Services you may have received from other than [facility] providers in the past year (date may be approximate).    Assessment:   This is a routine wellness examination for Jason Farrell.   Hearing/Vision screen Vision Screening Comments: Pt sees Dr Jackalyn Lombard for vision checks yearly.   Dietary issues and exercise activities discussed: Current Exercise Habits: The patient does not participate in regular exercise at present, Exercise limited by: None identified  Goals    . Increase water intake          Recommend increasing water intake to 4 glasses a day.      Depression Screen PHQ 2/9 Scores 12/03/2016 10/11/2016 04/05/2016 12/09/2015  PHQ - 2 Score 0 0 0 0    Fall Risk Fall Risk  12/03/2016 10/11/2016 04/05/2016 12/09/2015 11/07/2015  Falls in the past year? No No No No No    Cognitive Function:     6CIT Screen 12/03/2016  What Year? 0 points  What month? 0 points  What time? 0 points  Count back from 20 0 points  Months in reverse 2 points  Repeat phrase 4 points  Total Score 6    Screening Tests Health Maintenance  Topic Date Due  . INFLUENZA VACCINE  02/13/2017  . FOOT EXAM  04/05/2017  . HEMOGLOBIN A1C  04/13/2017  . OPHTHALMOLOGY EXAM  06/20/2017  . TETANUS/TDAP  07/16/2021  .  COLONOSCOPY  09/14/2023  . PNA vac Low Risk Adult  Completed        Plan:  I have personally reviewed and addressed the Medicare Annual Wellness questionnaire and have noted the following in the patient's chart:  A. Medical and social history B. Use of alcohol, tobacco or illicit drugs  C. Current medications and supplements D. Functional ability and status E.  Nutritional status F.  Physical activity G. Advance directives H. List of other physicians I.  Hospitalizations, surgeries, and ER visits in previous 12 months J.  [place] such as hearing and vision if needed, cognitive and depression L. Referrals and appointments - none  In addition, I have reviewed and discussed with patient certain preventive protocols, quality metrics, and best practice recommendations. A written personalized care plan for preventive services as well as general preventive health recommendations were provided to patient.  See attached scanned questionnaire for additional information.   Signed,  Fabio Neighbors, LPN Nurse Health Advisor   MD Recommendations: None.  I, as supervising physician, have reviewed the nurse health advisor's Medicare Wellness Visit note for this patient and concur with the findings and recommendations listed above.  Signed Syed Asad A. Manuella Ghazi MD Attending Physician.

## 2016-12-11 DIAGNOSIS — D225 Melanocytic nevi of trunk: Secondary | ICD-10-CM | POA: Diagnosis not present

## 2016-12-11 DIAGNOSIS — C44519 Basal cell carcinoma of skin of other part of trunk: Secondary | ICD-10-CM | POA: Diagnosis not present

## 2016-12-11 DIAGNOSIS — D485 Neoplasm of uncertain behavior of skin: Secondary | ICD-10-CM | POA: Diagnosis not present

## 2016-12-11 DIAGNOSIS — Z08 Encounter for follow-up examination after completed treatment for malignant neoplasm: Secondary | ICD-10-CM | POA: Diagnosis not present

## 2016-12-11 DIAGNOSIS — Z85828 Personal history of other malignant neoplasm of skin: Secondary | ICD-10-CM | POA: Diagnosis not present

## 2016-12-11 DIAGNOSIS — L821 Other seborrheic keratosis: Secondary | ICD-10-CM | POA: Diagnosis not present

## 2016-12-11 DIAGNOSIS — X32XXXA Exposure to sunlight, initial encounter: Secondary | ICD-10-CM | POA: Diagnosis not present

## 2016-12-11 DIAGNOSIS — L570 Actinic keratosis: Secondary | ICD-10-CM | POA: Diagnosis not present

## 2016-12-24 ENCOUNTER — Other Ambulatory Visit: Payer: Self-pay | Admitting: Family Medicine

## 2016-12-24 DIAGNOSIS — K219 Gastro-esophageal reflux disease without esophagitis: Secondary | ICD-10-CM

## 2016-12-24 DIAGNOSIS — E119 Type 2 diabetes mellitus without complications: Secondary | ICD-10-CM

## 2017-01-11 ENCOUNTER — Ambulatory Visit: Payer: Medicare Other | Admitting: Family Medicine

## 2017-01-15 DIAGNOSIS — C44519 Basal cell carcinoma of skin of other part of trunk: Secondary | ICD-10-CM | POA: Diagnosis not present

## 2017-01-15 DIAGNOSIS — L905 Scar conditions and fibrosis of skin: Secondary | ICD-10-CM | POA: Diagnosis not present

## 2017-01-17 ENCOUNTER — Encounter: Payer: Self-pay | Admitting: Family Medicine

## 2017-01-17 ENCOUNTER — Ambulatory Visit (INDEPENDENT_AMBULATORY_CARE_PROVIDER_SITE_OTHER): Payer: Medicare Other | Admitting: Family Medicine

## 2017-01-17 VITALS — BP 134/62 | HR 66 | Temp 97.6°F | Resp 18 | Ht 66.0 in | Wt 228.3 lb

## 2017-01-17 DIAGNOSIS — E7800 Pure hypercholesterolemia, unspecified: Secondary | ICD-10-CM | POA: Diagnosis not present

## 2017-01-17 DIAGNOSIS — I2510 Atherosclerotic heart disease of native coronary artery without angina pectoris: Secondary | ICD-10-CM | POA: Diagnosis not present

## 2017-01-17 DIAGNOSIS — I10 Essential (primary) hypertension: Secondary | ICD-10-CM

## 2017-01-17 DIAGNOSIS — E119 Type 2 diabetes mellitus without complications: Secondary | ICD-10-CM

## 2017-01-17 LAB — LIPID PANEL
CHOLESTEROL: 120 mg/dL (ref <200)
HDL: 47 mg/dL (ref >40)
LDL CALC: 49 mg/dL (ref <100)
TRIGLYCERIDES: 121 mg/dL (ref <150)
Total CHOL/HDL Ratio: 2.6 Ratio (ref <5.0)
VLDL: 24 mg/dL (ref <30)

## 2017-01-17 LAB — POCT GLYCOSYLATED HEMOGLOBIN (HGB A1C): Hemoglobin A1C: 6.6

## 2017-01-17 MED ORDER — LISINOPRIL 20 MG PO TABS: 20.0000 mg | ORAL_TABLET | Freq: Two times a day (BID) | ORAL | 1 refills | Status: DC

## 2017-01-17 NOTE — Progress Notes (Signed)
Name: Jason Farrell   MRN: [id number]    DOB: 16-May-1941   Date:01/17/2017       Progress Note  Subjective  Chief Complaint  Chief Complaint  Patient presents with  . Diabetes    3 month follow up checking blood sugars once a week avg 150  . Hyperlipidemia  . Obesity  . Sleep Apnea  . 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 He has type 2 diabetes mellitus. His disease course has been stable. There are no hypoglycemic associated symptoms. Pertinent negatives for hypoglycemia include no headaches. Pertinent negatives for diabetes include no blurred vision, no chest pain, no fatigue, no foot paresthesias, no polydipsia and no polyuria. Pertinent negatives for diabetic complications include no CVA. Risk factors for coronary artery disease include dyslipidemia and obesity. Current diabetic treatment includes oral agent (triple therapy). His weight is stable. He is following a diabetic and generally healthy diet. He monitors blood glucose at home 1-2 x per week. His breakfast blood glucose range is generally 140-180 mg/dl. (Average 150 mg/dL) An ACE inhibitor/angiotensin II receptor blocker is being taken.  Hyperlipidemia  This is a chronic problem. The problem is controlled. Recent lipid tests were reviewed and are normal. Pertinent negatives include no chest pain, leg pain or myalgias. Current antihyperlipidemic treatment includes statins.  Hypertension  This is a chronic problem. The problem is unchanged. The problem is controlled. Pertinent negatives include no blurred vision, chest pain, headaches, orthopnea or palpitations. Past treatments include beta blockers, diuretics and ACE inhibitors. Hypertensive end-organ damage includes CAD/MI (PCI in 1996 and 2006). There is no history of kidney disease or CVA.     Past Medical History:  Diagnosis Date  . Coronary artery disease   . Diabetes mellitus without  complication ([place])   . Dyslipidemia   . GERD (gastroesophageal reflux disease)   . Hyperlipidemia   . Hypertension   . MI (myocardial infarction) ([place])   . Osteoarthritis     Past Surgical History:  Procedure Laterality Date  . CARDIAC CATHETERIZATION  2006   stent placement  . COLONOSCOPY    . CORONARY ANGIOPLASTY WITH STENT PLACEMENT  2006   stent x 2   . CORONARY ANGIOPLASTY WITH STENT PLACEMENT  1996   stent x 3   . FOOT SURGERY Right   . HALLUX VALGUS CHEILECTOMY Right 02/25/2015   Procedure: HALLUX VALGUS CHEILECTOMY;  Surgeon: [name], DPM;  Location: [facility] ORS;  Service: Podiatry;  Laterality: Right;    Family History  Problem Relation Age of Onset  . Hyperlipidemia Father   . Hypertension Father   . Heart disease Sister   . Heart failure Sister   . Hypertension Sister   . Hyperlipidemia Sister   . Hypertension Sister     Social History   Social History  . Marital status: Married    Spouse name: N/A  . Number of children: N/A  . Years of education: N/A   Occupational History  . Not on file.   Social History Main Topics  . Smoking status: Former Smoker    Packs/day: 1.50    Years: 35.00    Types: Cigarettes    Quit date: 08/27/1994  . Smokeless tobacco: Never Used  . Alcohol use 8.4 oz/week    14 Glasses of wine per week  . Drug use: No  . Sexual activity: Not on file  Other Topics Concern  . Not on file   Social History Narrative  . No narrative on file     Current Outpatient Prescriptions:  .  amLODipine (NORVASC) 10 MG tablet, TAKE 1 TABLET DAILY, Disp: 90 tablet, Rfl: 1 .  aspirin 81 MG tablet, Take 81 mg by mouth daily., Disp: , Rfl:  .  atorvastatin (LIPITOR) 40 MG tablet, TAKE 1 TABLET DAILY, Disp: 90 tablet, Rfl: 1 .  Coenzyme Q10 (CO Q10) 200 MG CAPS, Take 1 capsule by mouth daily., Disp: , Rfl:  .  FLUZONE HIGH-DOSE 0.5 ML SUSY, ADM 0.5ML IM UTD, Disp: , Rfl: 0 .  glipiZIDE (GLUCOTROL) 5 MG tablet, Take 1 tablet (5 mg  total) by mouth daily., Disp: 90 tablet, Rfl: 1 .  glucose blood test strip, Use as instructed, Disp: 100 each, Rfl: 12 .  hydrochlorothiazide (HYDRODIURIL) 25 MG tablet, TAKE 1 TABLET DAILY, Disp: 90 tablet, Rfl: 1 .  JANUVIA 100 MG tablet, TAKE 1 TABLET DAILY, Disp: 90 tablet, Rfl: 0 .  lisinopril (PRINIVIL,ZESTRIL) 20 MG tablet, Take 1 tablet (20 mg total) by mouth 2 (two) times daily., Disp: 180 tablet, Rfl: 1 .  metFORMIN (GLUCOPHAGE) 1000 MG tablet, TAKE 1 TABLET TWICE A DAY WITH A MEAL, Disp: 180 tablet, Rfl: 1 .  metoprolol succinate (TOPROL-XL) 100 MG 24 hr tablet, TAKE 1 TABLET DAILY,  WITH OR IMMEDIATELY FOLLOWING A MEAL, Disp: 90 tablet, Rfl: 0 .  Multiple Vitamins-Minerals (CENTRUM SILVER PO), Take by mouth daily., Disp: , Rfl:  .  naproxen sodium (ANAPROX) 220 MG tablet, Take 220 mg by mouth 2 (two) times daily with a meal., Disp: , Rfl:  .  Omega-3 Fatty Acids (FISH OIL) 1200 MG CAPS, Take 1,200 mg by mouth daily., Disp: , Rfl:  .  omeprazole (PRILOSEC) 40 MG capsule, TAKE 1 CAPSULE DAILY, Disp: 90 capsule, Rfl: 0 .  Vitamin D, Cholecalciferol, 1000 UNITS CAPS, Take 1,000 units of lipase by mouth daily., Disp: , Rfl:   No Known Allergies   Review of Systems  Constitutional: Negative for fatigue.  Eyes: Negative for blurred vision.  Cardiovascular: Negative for chest pain, palpitations and orthopnea.  Musculoskeletal: Negative for myalgias.  Neurological: Negative for headaches.  Endo/Heme/Allergies: Negative for polydipsia.     Objective  Vitals:   01/17/17 0821  BP: 134/62  Pulse: 66  Resp: 18  Temp: 97.6 F (36.4 C)  SpO2: 96%  Weight: 228 lb 5 oz (103.6 kg)  Height: 5\' 6"  (1.676 m)    Physical Exam  Constitutional: He is oriented to person, place, and time and well-developed, well-nourished, and in no distress.  HENT:  Head: Normocephalic and atraumatic.  Cardiovascular: Normal rate, regular rhythm and normal heart sounds.   No murmur  heard. Pulmonary/Chest: Effort normal and breath sounds normal. He has no wheezes.  Abdominal: Soft. Bowel sounds are normal. He exhibits no distension. There is no tenderness.  Musculoskeletal: He exhibits no edema.  Neurological: He is alert and oriented to person, place, and time.  Psychiatric: Mood, memory, affect and judgment normal.  Nursing note and vitals reviewed.    Recent Results (from the past 2160 hour(s))  POCT HgB A1C     Status: None   Collection Time: 01/17/17  8:33 AM  Result Value Ref Range   Hemoglobin A1C 6.6      Assessment & Plan  1. Controlled type 2 diabetes mellitus without complication, without long-term current use of insulin (HCC) Point-of-care A1c is 6.6%, well-controlled diabetes -  POCT HgB A1C  2. Essential hypertension BP stable on present and evidence of therapy - lisinopril (PRINIVIL,ZESTRIL) 20 MG tablet; Take 1 tablet (20 mg total) by mouth 2 (two) times daily.  Dispense: 180 tablet; Refill: 1  3. Pure hypercholesterolemia  - Lipid panel   Mendy Lapinsky [facility] 01/17/2017 8:46 AM

## 2017-01-29 DIAGNOSIS — C44519 Basal cell carcinoma of skin of other part of trunk: Secondary | ICD-10-CM | POA: Diagnosis not present

## 2017-03-07 ENCOUNTER — Other Ambulatory Visit: Payer: Self-pay | Admitting: Family Medicine

## 2017-03-07 DIAGNOSIS — E119 Type 2 diabetes mellitus without complications: Secondary | ICD-10-CM

## 2017-04-08 DIAGNOSIS — Z23 Encounter for immunization: Secondary | ICD-10-CM | POA: Diagnosis not present

## 2017-04-15 ENCOUNTER — Other Ambulatory Visit: Payer: Self-pay | Admitting: Family Medicine

## 2017-04-15 DIAGNOSIS — K219 Gastro-esophageal reflux disease without esophagitis: Secondary | ICD-10-CM

## 2017-04-16 ENCOUNTER — Ambulatory Visit: Payer: Medicare Other | Attending: Neurology

## 2017-04-16 DIAGNOSIS — G4733 Obstructive sleep apnea (adult) (pediatric): Secondary | ICD-10-CM | POA: Insufficient documentation

## 2017-04-16 DIAGNOSIS — E669 Obesity, unspecified: Secondary | ICD-10-CM | POA: Insufficient documentation

## 2017-04-16 DIAGNOSIS — R0683 Snoring: Secondary | ICD-10-CM | POA: Diagnosis not present

## 2017-04-16 DIAGNOSIS — E119 Type 2 diabetes mellitus without complications: Secondary | ICD-10-CM | POA: Insufficient documentation

## 2017-04-16 DIAGNOSIS — G4761 Periodic limb movement disorder: Secondary | ICD-10-CM | POA: Diagnosis not present

## 2017-04-16 DIAGNOSIS — I493 Ventricular premature depolarization: Secondary | ICD-10-CM | POA: Diagnosis not present

## 2017-04-16 DIAGNOSIS — I119 Hypertensive heart disease without heart failure: Secondary | ICD-10-CM | POA: Insufficient documentation

## 2017-04-16 DIAGNOSIS — Z6836 Body mass index (BMI) 36.0-36.9, adult: Secondary | ICD-10-CM | POA: Insufficient documentation

## 2017-04-19 ENCOUNTER — Ambulatory Visit: Payer: Medicare Other | Admitting: Family Medicine

## 2017-04-30 ENCOUNTER — Ambulatory Visit (INDEPENDENT_AMBULATORY_CARE_PROVIDER_SITE_OTHER): Payer: Medicare Other | Admitting: Family Medicine

## 2017-04-30 VITALS — BP 122/58 | HR 72 | Temp 98.3°F | Resp 16 | Ht 66.0 in | Wt 225.4 lb

## 2017-04-30 DIAGNOSIS — E119 Type 2 diabetes mellitus without complications: Secondary | ICD-10-CM | POA: Diagnosis not present

## 2017-04-30 DIAGNOSIS — I10 Essential (primary) hypertension: Secondary | ICD-10-CM

## 2017-04-30 DIAGNOSIS — E7800 Pure hypercholesterolemia, unspecified: Secondary | ICD-10-CM

## 2017-04-30 DIAGNOSIS — I2510 Atherosclerotic heart disease of native coronary artery without angina pectoris: Secondary | ICD-10-CM

## 2017-04-30 DIAGNOSIS — K219 Gastro-esophageal reflux disease without esophagitis: Secondary | ICD-10-CM

## 2017-04-30 LAB — GLUCOSE, POCT (MANUAL RESULT ENTRY): POC Glucose: 153 mg/dl — AB (ref 70–99)

## 2017-04-30 LAB — POCT GLYCOSYLATED HEMOGLOBIN (HGB A1C): HEMOGLOBIN A1C: 6.6

## 2017-04-30 NOTE — Progress Notes (Signed)
Name: Jason Farrell   MRN: [id number]    DOB: [birth year]   Date:04/30/2017       Progress Note  Subjective  Chief Complaint  Chief Complaint  Patient presents with  . Diabetes    3 month f/u  . Hyperlipidemia    3 month f/u    Diabetes  He presents for his follow-up diabetic visit. He has type 2 diabetes mellitus. His disease course has been stable. There are no hypoglycemic associated symptoms. Pertinent negatives for hypoglycemia include no headaches. Pertinent negatives for diabetes include no blurred vision, no chest pain, no foot paresthesias, no polydipsia and no polyuria. Diabetic complications include heart disease. Pertinent negatives for diabetic complications include no CVA or peripheral neuropathy. (Has history of MI in 1996 and 2006) Current diabetic treatment includes oral agent (triple therapy). He is following a diabetic and generally healthy diet. He participates in exercise weekly (works in garden no formal exercise routine). His breakfast blood glucose range is generally 140-180 mg/dl. An ACE inhibitor/angiotensin II receptor blocker is being taken.  Hyperlipidemia  This is a chronic problem. The problem is controlled. Recent lipid tests were reviewed and are normal. Pertinent negatives include no chest pain, leg pain, myalgias or shortness of breath. Current antihyperlipidemic treatment includes statins.  Hypertension  This is a recurrent problem. The problem is unchanged. The problem is controlled. Pertinent negatives include no blurred vision, chest pain, headaches, palpitations or shortness of breath. Past treatments include beta blockers, calcium channel blockers, ACE inhibitors and diuretics. Hypertensive end-organ damage includes CAD/MI. There is no history of kidney disease, CVA or heart failure.     Past Medical History:  Diagnosis Date  . Coronary artery disease   . Diabetes mellitus without complication ([place])   . Dyslipidemia   . GERD (gastroesophageal reflux  disease)   . Hyperlipidemia   . Hypertension   . MI (myocardial infarction) ([place])   . Osteoarthritis     Past Surgical History:  Procedure Laterality Date  . CARDIAC CATHETERIZATION  2006   stent placement  . COLONOSCOPY    . CORONARY ANGIOPLASTY WITH STENT PLACEMENT  2006   stent x 2   . CORONARY ANGIOPLASTY WITH STENT PLACEMENT  1996   stent x 3   . FOOT SURGERY Right   . HALLUX VALGUS CHEILECTOMY Right 02/25/2015   Procedure: HALLUX VALGUS CHEILECTOMY;  Surgeon: [name], DPM;  Location: [facility] ORS;  Service: Podiatry;  Laterality: Right;    Family History  Problem Relation Age of Onset  . Hyperlipidemia Father   . Hypertension Father   . Heart disease Sister   . Heart failure Sister   . Hypertension Sister   . Hyperlipidemia Sister   . Hypertension Sister     Social History   Social History  . Marital status: Married    Spouse name: N/A  . Number of children: N/A  . Years of education: N/A   Occupational History  . Not on file.   Social History Main Topics  . Smoking status: Former Smoker    Packs/day: 1.50    Years: 35.00    Types: Cigarettes    Quit date: 08/27/1994  . Smokeless tobacco: Never Used  . Alcohol use 8.4 oz/week    14 Glasses of wine per week  . Drug use: No  . Sexual activity: Not on file   Other Topics Concern  . Not on file   Social History Narrative  . No narrative on file  Current Outpatient Prescriptions:  .  amLODipine (NORVASC) 10 MG tablet, TAKE 1 TABLET DAILY, Disp: 90 tablet, Rfl: 1 .  aspirin 81 MG tablet, Take 81 mg by mouth daily., Disp: , Rfl:  .  atorvastatin (LIPITOR) 40 MG tablet, TAKE 1 TABLET DAILY, Disp: 90 tablet, Rfl: 1 .  Coenzyme Q10 (CO Q10) 200 MG CAPS, Take 1 capsule by mouth daily., Disp: , Rfl:  .  glipiZIDE (GLUCOTROL) 5 MG tablet, Take 1 tablet (5 mg total) by mouth daily., Disp: 90 tablet, Rfl: 1 .  glucose blood test strip, Use as instructed, Disp: 100 each, Rfl: 12 .  hydrochlorothiazide  (HYDRODIURIL) 25 MG tablet, TAKE 1 TABLET DAILY, Disp: 90 tablet, Rfl: 1 .  JANUVIA 100 MG tablet, TAKE 1 TABLET DAILY, Disp: 90 tablet, Rfl: 0 .  lisinopril (PRINIVIL,ZESTRIL) 20 MG tablet, Take 1 tablet (20 mg total) by mouth 2 (two) times daily., Disp: 180 tablet, Rfl: 1 .  metFORMIN (GLUCOPHAGE) 1000 MG tablet, TAKE 1 TABLET TWICE A DAY WITH A MEAL, Disp: 180 tablet, Rfl: 1 .  metoprolol succinate (TOPROL-XL) 100 MG 24 hr tablet, TAKE 1 TABLET DAILY,  WITH OR IMMEDIATELY FOLLOWING A MEAL, Disp: 90 tablet, Rfl: 0 .  Multiple Vitamins-Minerals (CENTRUM SILVER PO), Take by mouth daily., Disp: , Rfl:  .  naproxen sodium (ANAPROX) 220 MG tablet, Take 220 mg by mouth 2 (two) times daily with a meal., Disp: , Rfl:  .  Omega-3 Fatty Acids (FISH OIL) 1200 MG CAPS, Take 1,200 mg by mouth daily., Disp: , Rfl:  .  omeprazole (PRILOSEC) 40 MG capsule, TAKE 1 CAPSULE DAILY, Disp: 90 capsule, Rfl: 0 .  Vitamin D, Cholecalciferol, 1000 UNITS CAPS, Take 1,000 units of lipase by mouth daily., Disp: , Rfl:  .  FLUZONE HIGH-DOSE 0.5 ML SUSY, ADM 0.5ML IM UTD, Disp: , Rfl: 0  No Known Allergies   Review of Systems  Eyes: Negative for blurred vision.  Respiratory: Negative for shortness of breath.   Cardiovascular: Negative for chest pain and palpitations.  Musculoskeletal: Negative for myalgias.  Neurological: Negative for headaches.  Endo/Heme/Allergies: Negative for polydipsia.    Objective  Vitals:   04/30/17 0946  BP: (!) 122/58  Pulse: 72  Resp: 16  Temp: 98.3 F (36.8 C)  TempSrc: Oral  SpO2: 95%  Weight: 225 lb 6.4 oz (102.2 kg)  Height: 5\' 6"  (1.676 m)    Physical Exam  Constitutional: He is oriented to person, place, and time and well-developed, well-nourished, and in no distress.  HENT:  Head: Normocephalic and atraumatic.  Cardiovascular: Normal rate, regular rhythm and normal heart sounds.   No murmur heard. Pulmonary/Chest: Effort normal and breath sounds normal. He has no  wheezes.  Abdominal: Soft. Bowel sounds are normal. He exhibits no distension. There is no tenderness.  Musculoskeletal: Edema: trace pitting edema bilateral lower extremities.  Neurological: He is alert and oriented to person, place, and time.  Psychiatric: Mood, memory, affect and judgment normal.  Nursing note and vitals reviewed.      Recent Results (from the past 2160 hour(s))  POCT Glucose (CBG)     Status: Abnormal   Collection Time: 04/30/17  9:52 AM  Result Value Ref Range   POC Glucose 153 (A) 70 - 99 mg/dl  POCT HgB A1C     Status: Abnormal   Collection Time: 04/30/17  9:55 AM  Result Value Ref Range   Hemoglobin A1C 6.6      Assessment & Plan  1.  Controlled type 2 diabetes mellitus without complication, without long-term current use of insulin (HCC) Point-of-care A1c 6.6%, well-controlled diabetes - POCT HgB A1C - POCT Glucose (CBG)  2. Essential hypertension Stable present anti- hypertensive treatment  3. Gastroesophageal reflux disease, esophagitis presence not specified Symptoms stable on PPI  4. Pure hypercholesterolemia FLP at goal, continue present management   Jason Farrell [facility] 04/30/2017 10:06 AM

## 2017-05-01 DIAGNOSIS — L821 Other seborrheic keratosis: Secondary | ICD-10-CM | POA: Diagnosis not present

## 2017-05-01 DIAGNOSIS — X32XXXA Exposure to sunlight, initial encounter: Secondary | ICD-10-CM | POA: Diagnosis not present

## 2017-05-01 DIAGNOSIS — D2261 Melanocytic nevi of right upper limb, including shoulder: Secondary | ICD-10-CM | POA: Diagnosis not present

## 2017-05-01 DIAGNOSIS — D225 Melanocytic nevi of trunk: Secondary | ICD-10-CM | POA: Diagnosis not present

## 2017-05-01 DIAGNOSIS — L570 Actinic keratosis: Secondary | ICD-10-CM | POA: Diagnosis not present

## 2017-05-01 DIAGNOSIS — Z85828 Personal history of other malignant neoplasm of skin: Secondary | ICD-10-CM | POA: Diagnosis not present

## 2017-05-17 ENCOUNTER — Other Ambulatory Visit: Payer: Self-pay | Admitting: Family Medicine

## 2017-05-17 DIAGNOSIS — E119 Type 2 diabetes mellitus without complications: Secondary | ICD-10-CM

## 2017-05-22 ENCOUNTER — Ambulatory Visit: Payer: Medicare Other | Attending: Neurology

## 2017-05-22 DIAGNOSIS — G4761 Periodic limb movement disorder: Secondary | ICD-10-CM | POA: Insufficient documentation

## 2017-05-22 DIAGNOSIS — G4733 Obstructive sleep apnea (adult) (pediatric): Secondary | ICD-10-CM | POA: Insufficient documentation

## 2017-07-03 ENCOUNTER — Other Ambulatory Visit: Payer: Self-pay | Admitting: Family Medicine

## 2017-07-03 DIAGNOSIS — E119 Type 2 diabetes mellitus without complications: Secondary | ICD-10-CM

## 2017-07-25 ENCOUNTER — Other Ambulatory Visit: Payer: Self-pay | Admitting: Family Medicine

## 2017-07-31 ENCOUNTER — Encounter: Payer: Self-pay | Admitting: Family Medicine

## 2017-07-31 ENCOUNTER — Ambulatory Visit (INDEPENDENT_AMBULATORY_CARE_PROVIDER_SITE_OTHER): Payer: Medicare Other | Admitting: Family Medicine

## 2017-07-31 VITALS — BP 136/64 | HR 72 | Temp 97.6°F | Resp 16 | Ht 66.0 in | Wt 227.8 lb

## 2017-07-31 DIAGNOSIS — E119 Type 2 diabetes mellitus without complications: Secondary | ICD-10-CM | POA: Diagnosis not present

## 2017-07-31 DIAGNOSIS — I10 Essential (primary) hypertension: Secondary | ICD-10-CM | POA: Diagnosis not present

## 2017-07-31 DIAGNOSIS — E7800 Pure hypercholesterolemia, unspecified: Secondary | ICD-10-CM

## 2017-07-31 DIAGNOSIS — K219 Gastro-esophageal reflux disease without esophagitis: Secondary | ICD-10-CM | POA: Diagnosis not present

## 2017-07-31 LAB — POCT GLYCOSYLATED HEMOGLOBIN (HGB A1C): HEMOGLOBIN A1C: 7

## 2017-07-31 LAB — GLUCOSE, POCT (MANUAL RESULT ENTRY): POC GLUCOSE: 151 mg/dL — AB (ref 70–99)

## 2017-07-31 LAB — LIPID PANEL
CHOLESTEROL: 121 mg/dL (ref <200)
HDL: 46 mg/dL (ref >40)
LDL CHOLESTEROL (CALC): 51 mg/dL
Non-HDL Cholesterol (Calc): 75 mg/dL (calc) (ref <130)
TRIGLYCERIDES: 154 mg/dL — AB (ref <150)
Total CHOL/HDL Ratio: 2.6 (calc) (ref <5.0)

## 2017-07-31 MED ORDER — OMEPRAZOLE 40 MG PO CPDR: 40.0000 mg | DELAYED_RELEASE_CAPSULE | Freq: Every day | ORAL | 0 refills | Status: DC

## 2017-07-31 MED ORDER — LISINOPRIL 20 MG PO TABS: 20.0000 mg | ORAL_TABLET | Freq: Two times a day (BID) | ORAL | 1 refills | Status: DC

## 2017-07-31 NOTE — Progress Notes (Signed)
Name: Jason Farrell   MRN: [id number]    DOB: 1940-09-23   Date:07/31/2017       Progress Note  Subjective  Chief Complaint  Chief Complaint  Patient presents with  . Hyperlipidemia  . Hypertension  . Diabetes  . Medication Refill    Hyperlipidemia  This is a chronic problem. The problem is controlled. Recent lipid tests were reviewed and are normal. Exacerbating diseases include obesity. Pertinent negatives include no chest pain, leg pain, myalgias or shortness of breath. Current antihyperlipidemic treatment includes statins.  Hypertension  This is a chronic problem. The problem is unchanged. The problem is controlled. Pertinent negatives include no blurred vision, chest pain, headaches, palpitations, shortness of breath or sweats. (He has noticed DBP in the low to mid 50s for the last 1+ months, SBP is normal in the 120s) Past treatments include calcium channel blockers, diuretics, beta blockers and ACE inhibitors. Hypertensive end-organ damage includes CAD/MI. There is no history of kidney disease or CVA.  Diabetes  He presents for his follow-up diabetic visit. He has type 2 diabetes mellitus. His disease course has been stable. Pertinent negatives for hypoglycemia include no dizziness, headaches, sleepiness or sweats. Pertinent negatives for diabetes include no blurred vision, no chest pain, no fatigue, no foot paresthesias, no polydipsia and no polyuria. There are no hypoglycemic complications. Diabetic complications include heart disease. Pertinent negatives for diabetic complications include no CVA or peripheral neuropathy. Current diabetic treatment includes oral agent (triple therapy). He is compliant with treatment all of the time. His weight is stable. He is following a generally healthy and diabetic diet. He rarely participates in exercise. He monitors blood glucose at home 1-2 x per week. His breakfast blood glucose range is generally 140-180 mg/dl. An ACE inhibitor/angiotensin II  receptor blocker is being taken. Eye exam is current.    Past Medical History:  Diagnosis Date  . Coronary artery disease   . Diabetes mellitus without complication ([place])   . Dyslipidemia   . GERD (gastroesophageal reflux disease)   . Hyperlipidemia   . Hypertension   . MI (myocardial infarction) ([place])   . Osteoarthritis     Past Surgical History:  Procedure Laterality Date  . CARDIAC CATHETERIZATION  2006   stent placement  . COLONOSCOPY    . CORONARY ANGIOPLASTY WITH STENT PLACEMENT  2006   stent x 2   . CORONARY ANGIOPLASTY WITH STENT PLACEMENT  1996   stent x 3   . FOOT SURGERY Right   . HALLUX VALGUS CHEILECTOMY Right 02/25/2015   Procedure: HALLUX VALGUS CHEILECTOMY;  Surgeon: [name], DPM;  Location: [facility] ORS;  Service: Podiatry;  Laterality: Right;    Family History  Problem Relation Age of Onset  . Hyperlipidemia Father   . Hypertension Father   . Heart disease Sister   . Heart failure Sister   . Hypertension Sister   . Hyperlipidemia Sister   . Hypertension Sister     Social History   Socioeconomic History  . Marital status: Married    Spouse name: Not on file  . Number of children: Not on file  . Years of education: Not on file  . Highest education level: Not on file  Social Needs  . Financial resource strain: Not on file  . Food insecurity - worry: Not on file  . Food insecurity - inability: Not on file  . Transportation needs - medical: Not on file  . Transportation needs - non-medical: Not on file  Occupational History  .  Not on file  Tobacco Use  . Smoking status: Former Smoker    Packs/day: 1.50    Years: 35.00    Pack years: 52.50    Types: Cigarettes    Last attempt to quit: 08/27/1994    Years since quitting: 22.9  . Smokeless tobacco: Never Used  Substance and Sexual Activity  . Alcohol use: Yes    Alcohol/week: 8.4 oz    Types: 14 Glasses of wine per week  . Drug use: No  . Sexual activity: Not on file  Other Topics  Concern  . Not on file  Social History Narrative  . Not on file     Current Outpatient Medications:  .  amLODipine (NORVASC) 10 MG tablet, TAKE 1 TABLET DAILY, Disp: 90 tablet, Rfl: 1 .  aspirin 81 MG tablet, Take 81 mg by mouth daily., Disp: , Rfl:  .  atorvastatin (LIPITOR) 40 MG tablet, TAKE 1 TABLET DAILY, Disp: 90 tablet, Rfl: 1 .  Coenzyme Q10 (CO Q10) 200 MG CAPS, Take 1 capsule by mouth daily., Disp: , Rfl:  .  glipiZIDE (GLUCOTROL) 5 MG tablet, TAKE 1 TABLET DAILY, Disp: 90 tablet, Rfl: 1 .  glucose blood test strip, Use as instructed, Disp: 100 each, Rfl: 12 .  hydrochlorothiazide (HYDRODIURIL) 25 MG tablet, TAKE 1 TABLET DAILY, Disp: 90 tablet, Rfl: 1 .  JANUVIA 100 MG tablet, TAKE 1 TABLET DAILY, Disp: 90 tablet, Rfl: 0 .  lisinopril (PRINIVIL,ZESTRIL) 20 MG tablet, Take 1 tablet (20 mg total) by mouth 2 (two) times daily., Disp: 180 tablet, Rfl: 1 .  metFORMIN (GLUCOPHAGE) 1000 MG tablet, TAKE 1 TABLET TWICE A DAY WITH A MEAL, Disp: 180 tablet, Rfl: 1 .  metoprolol succinate (TOPROL-XL) 100 MG 24 hr tablet, TAKE 1 TABLET DAILY,  WITH OR IMMEDIATELY FOLLOWING A MEAL, Disp: 90 tablet, Rfl: 0 .  Multiple Vitamins-Minerals (CENTRUM SILVER PO), Take by mouth daily., Disp: , Rfl:  .  naproxen sodium (ANAPROX) 220 MG tablet, Take 220 mg by mouth 2 (two) times daily with a meal., Disp: , Rfl:  .  Omega-3 Fatty Acids (FISH OIL) 1200 MG CAPS, Take 1,200 mg by mouth daily., Disp: , Rfl:  .  omeprazole (PRILOSEC) 40 MG capsule, TAKE 1 CAPSULE DAILY, Disp: 90 capsule, Rfl: 0 .  Vitamin D, Cholecalciferol, 1000 UNITS CAPS, Take 1,000 units of lipase by mouth daily., Disp: , Rfl:   No Known Allergies   Review of Systems  Constitutional: Negative for fatigue.  Eyes: Negative for blurred vision.  Respiratory: Negative for shortness of breath.   Cardiovascular: Negative for chest pain and palpitations.  Musculoskeletal: Negative for myalgias.  Neurological: Negative for dizziness and  headaches.  Endo/Heme/Allergies: Negative for polydipsia.     Objective  Vitals:   07/31/17 0936  BP: 136/64  Pulse: 72  Resp: 16  Temp: 97.6 F (36.4 C)  TempSrc: Oral  SpO2: 96%  Weight: 227 lb 12.8 oz (103.3 kg)  Height: 5\' 6"  (1.676 m)    Physical Exam  Constitutional: He is oriented to person, place, and time and well-developed, well-nourished, and in no distress.  HENT:  Head: Normocephalic and atraumatic.  Cardiovascular: Normal rate, regular rhythm and normal heart sounds.  No murmur heard. Pulmonary/Chest: Effort normal and breath sounds normal. He has no wheezes.  Abdominal: Soft. Bowel sounds are normal. He exhibits no distension. There is no tenderness.  Musculoskeletal: He exhibits no edema (trace pitting edema bilaterally.).  Neurological: He is alert and oriented to  person, place, and time.  Psychiatric: Mood, memory, affect and judgment normal.  Nursing note and vitals reviewed.     Recent Results (from the past 2160 hour(s))  POCT Glucose (CBG)     Status: Abnormal   Collection Time: 07/31/17  9:37 AM  Result Value Ref Range   POC Glucose 151 (A) 70 - 99 mg/dl  POCT HgB A1C     Status: Abnormal   Collection Time: 07/31/17  9:45 AM  Result Value Ref Range   Hemoglobin A1C 7.0      Assessment & Plan  1. Controlled type 2 diabetes mellitus without complication, without long-term current use of insulin (HCC) Point-of-care A1c 7.0%, well-controlled diabetes, no change in pharmacotherapy - POCT HgB A1C - POCT Glucose (CBG)  2. Essential hypertension Cause of below normal diastolic blood pressure, we'll recommend decreasing amlodipine to 5 mg daily, advised to check blood pressure regularly and says in 3 months - lisinopril (PRINIVIL,ZESTRIL) 20 MG tablet; Take 1 tablet (20 mg total) by mouth 2 (two) times daily.  Dispense: 180 tablet; Refill: 1  3. Pure hypercholesterolemia Goal LDL is less than 70 mg/dL because of diabetes and CVD - Lipid  panel  4. Gastroesophageal reflux disease, esophagitis presence not specified  - omeprazole (PRILOSEC) 40 MG capsule; Take 1 capsule (40 mg total) by mouth daily.  Dispense: 90 capsule; Refill: 0   Katielynn Horan [facility] 07/31/2017 9:55 AM

## 2017-08-30 ENCOUNTER — Other Ambulatory Visit: Payer: Self-pay | Admitting: Family Medicine

## 2017-08-30 DIAGNOSIS — E119 Type 2 diabetes mellitus without complications: Secondary | ICD-10-CM

## 2017-10-17 ENCOUNTER — Telehealth: Payer: Self-pay | Admitting: Family Medicine

## 2017-10-17 ENCOUNTER — Other Ambulatory Visit: Payer: Self-pay

## 2017-10-17 MED ORDER — METOPROLOL SUCCINATE ER 100 MG PO TB24: ORAL_TABLET | ORAL | 0 refills | Status: DC

## 2017-10-17 NOTE — Telephone Encounter (Signed)
Will route to office for approval

## 2017-10-17 NOTE — Telephone Encounter (Signed)
Copied from [place] [id number]. Topic: Quick Communication - Rx Refill/Question >> Oct 17, 2017 10:59 AM Cecelia Byars, NT wrote: Medication: metoprolol succinate (TOPROL-XL) 100 MG 24 hr tablet Has the patient contacted their pharmacy? {yes no (Agent: If no, request that the patient contact the pharmacy for the refill.) Preferred Pharmacy (with phone number or street name): Agent: Please be advised that RX refills may take up to 3 business days. We ask that you follow-up with your pharmacy.

## 2017-10-17 NOTE — Telephone Encounter (Signed)
Request sent to PCP for approval. 

## 2017-10-17 NOTE — Telephone Encounter (Signed)
Copied from [place] [id number]. Topic: Quick Communication - Rx Refill/Question >> Oct 17, 2017 10:59 AM Cecelia Byars, NT wrote: Medication: metoprolol succinate TOPROL-X 100 MG 24 hr tablet Has the patient contacted their pharmacy? {yes no (Agent: If no, request that the patient contact the pharmacy for the refill. Preferred Pharmacy (with phone number or street name):[facility] [place], [place] [phone] Phone [phone] Fax Agent: Please be advised that RX refills may take up to 3 business days. We ask that you follow-up with your pharmacy.

## 2017-10-17 NOTE — Telephone Encounter (Signed)
Refill request for Hypertension medication:  Metoprolol 100 mg  Last office visit pertaining to hypertension: 07/31/2017  BP Readings from Last 3 Encounters:  07/31/17 136/64  04/30/17 (!) 122/58  01/17/17 134/62     Lab Results  Component Value Date   CREATININE 0.88 04/05/2016   BUN 17 04/05/2016   NA 139 04/05/2016   K 4.6 04/05/2016   CL 100 04/05/2016   CO2 29 04/05/2016    Follow-ups on file. 10/22/2017

## 2017-10-22 ENCOUNTER — Encounter: Payer: Self-pay | Admitting: Family Medicine

## 2017-10-22 ENCOUNTER — Ambulatory Visit (INDEPENDENT_AMBULATORY_CARE_PROVIDER_SITE_OTHER): Payer: Medicare Other | Admitting: Family Medicine

## 2017-10-22 VITALS — BP 136/70 | HR 70 | Resp 14 | Ht 66.0 in | Wt 229.3 lb

## 2017-10-22 DIAGNOSIS — M159 Polyosteoarthritis, unspecified: Secondary | ICD-10-CM

## 2017-10-22 DIAGNOSIS — K219 Gastro-esophageal reflux disease without esophagitis: Secondary | ICD-10-CM | POA: Diagnosis not present

## 2017-10-22 DIAGNOSIS — E1169 Type 2 diabetes mellitus with other specified complication: Secondary | ICD-10-CM

## 2017-10-22 DIAGNOSIS — J42 Unspecified chronic bronchitis: Secondary | ICD-10-CM | POA: Diagnosis not present

## 2017-10-22 DIAGNOSIS — I10 Essential (primary) hypertension: Secondary | ICD-10-CM

## 2017-10-22 DIAGNOSIS — Z9989 Dependence on other enabling machines and devices: Secondary | ICD-10-CM | POA: Diagnosis not present

## 2017-10-22 DIAGNOSIS — E669 Obesity, unspecified: Secondary | ICD-10-CM | POA: Diagnosis not present

## 2017-10-22 DIAGNOSIS — G4733 Obstructive sleep apnea (adult) (pediatric): Secondary | ICD-10-CM

## 2017-10-22 DIAGNOSIS — E785 Hyperlipidemia, unspecified: Secondary | ICD-10-CM | POA: Diagnosis not present

## 2017-10-22 DIAGNOSIS — M150 Primary generalized (osteo)arthritis: Secondary | ICD-10-CM | POA: Diagnosis not present

## 2017-10-22 DIAGNOSIS — I252 Old myocardial infarction: Secondary | ICD-10-CM

## 2017-10-22 LAB — COMPLETE METABOLIC PANEL WITH GFR
AG Ratio: 1.8 (calc) (ref 1.0–2.5)
ALBUMIN MSPROF: 4.3 g/dL (ref 3.6–5.1)
ALT: 15 U/L (ref 9–46)
AST: 15 U/L (ref 10–35)
Alkaline phosphatase (APISO): 72 U/L (ref 40–115)
BUN: 16 mg/dL (ref 7–25)
CO2: 32 mmol/L (ref 20–32)
CREATININE: 1.04 mg/dL (ref 0.70–1.18)
Calcium: 9.5 mg/dL (ref 8.6–10.3)
Chloride: 101 mmol/L (ref 98–110)
GFR, EST AFRICAN AMERICAN: 82 mL/min/{1.73_m2} (ref >=60)
GFR, EST NON AFRICAN AMERICAN: 70 mL/min/{1.73_m2} (ref >=60)
GLOBULIN: 2.4 g/dL (ref 1.9–3.7)
Glucose, Bld: 114 mg/dL — ABNORMAL HIGH (ref 65–99)
Potassium: 4.5 mmol/L (ref 3.5–5.3)
SODIUM: 140 mmol/L (ref 135–146)
TOTAL PROTEIN: 6.7 g/dL (ref 6.1–8.1)
Total Bilirubin: 0.5 mg/dL (ref 0.2–1.2)

## 2017-10-22 LAB — POCT GLYCOSYLATED HEMOGLOBIN (HGB A1C): Hemoglobin A1C: 7.1

## 2017-10-22 MED ORDER — OMEPRAZOLE 40 MG PO CPDR: 40.0000 mg | DELAYED_RELEASE_CAPSULE | Freq: Every day | ORAL | 1 refills | Status: DC

## 2017-10-22 MED ORDER — METFORMIN HCL ER 750 MG PO TB24: 1500.0000 mg | ORAL_TABLET | Freq: Every day | ORAL | 1 refills | Status: DC

## 2017-10-22 MED ORDER — ATORVASTATIN CALCIUM 40 MG PO TABS: 40.0000 mg | ORAL_TABLET | Freq: Every day | ORAL | 1 refills | Status: DC

## 2017-10-22 MED ORDER — AMLODIPINE BESYLATE 10 MG PO TABS: 10.0000 mg | ORAL_TABLET | Freq: Every day | ORAL | 1 refills | Status: DC

## 2017-10-22 MED ORDER — GLIPIZIDE 5 MG PO TABS: 5.0000 mg | ORAL_TABLET | Freq: Every day | ORAL | 1 refills | Status: DC

## 2017-10-22 MED ORDER — LISINOPRIL-HYDROCHLOROTHIAZIDE 20-12.5 MG PO TABS: 1.0000 | ORAL_TABLET | Freq: Two times a day (BID) | ORAL | 1 refills | Status: DC

## 2017-10-22 NOTE — Progress Notes (Addendum)
Name: Jason Farrell   MRN: [id number]    DOB: [birth year]   Date:10/22/2017       Progress Note  Subjective  Chief Complaint  Chief Complaint  Patient presents with  . Diabetes  . Hypertension  . Hyperlipidemia    HPI  DMII: he was diagnosed many years ago, taking Januvia, Metformin and Glipizide, last hgbA1C 7.0% back in January 2019. He states he usually checks in am's and glucose is usually at goal, but was 170 this morning. He states he had chocolate cake last pm. He has obesity, we will recheck kidney function, he has dyslipidemia and is on statin therapy, last triglycerides was high. He has polyphagia, no polydipsia or polyuria. Eye exam is up to date   OSA: wearing CPAP since 1996, very compliant , wears it every night all night. He states snoring is under control with CPAP and denies morning headaches  Morbid obesity: based on obesity with multiple risk factors. Discussed importance of cutting calories in a healthy way, eating lean mea and vegetables.   HTN: is usually under control, but high when he first came in today. Seeing cardiologist and bp was good at his office No chest pain or palpitation   COPD with history of tobacco use: he denies daily cough, not sure if he has SOB with activity because joint problems does not allow him to be very active. No wheezing  OA: seen ortho in [place] about 15 years and told he needed knee surgery, he has not seen ortho since, but interested in going now.  Patient Active Problem List   Diagnosis Date Noted  . Obstructive sleep apnea on CPAP 12/09/2015  . Acid reflux 05/06/2015  . Arthritis, degenerative 05/06/2015  . Arteriosclerosis of coronary artery 01/25/2015  . Diabetes mellitus type 2 in obese ([place]) 08/16/2014  . Hyperlipidemia 08/16/2014  . Essential hypertension 08/16/2014  . Morbid obesity ([place]) 08/16/2014  . Stopped smoking with greater than 30 pack year history 08/16/2014  . COLD (chronic obstructive lung disease) ([place])  08/16/2014    Past Surgical History:  Procedure Laterality Date  . CARDIAC CATHETERIZATION  2006   stent placement  . COLONOSCOPY    . CORONARY ANGIOPLASTY WITH STENT PLACEMENT  2006   stent x 2   . CORONARY ANGIOPLASTY WITH STENT PLACEMENT  1996   stent x 3   . FOOT SURGERY Right   . HALLUX VALGUS CHEILECTOMY Right 02/25/2015   Procedure: HALLUX VALGUS CHEILECTOMY;  Surgeon: [name], DPM;  Location: [facility] ORS;  Service: Podiatry;  Laterality: Right;    Family History  Problem Relation Age of Onset  . Hyperlipidemia Father   . Hypertension Father   . Heart disease Sister   . Heart failure Sister   . Hypertension Sister   . Hyperlipidemia Sister   . Hypertension Sister     Social History   Socioeconomic History  . Marital status: Married    Spouse name: Not on file  . Number of children: Not on file  . Years of education: Not on file  . Highest education level: Not on file  Occupational History  . Not on file  Social Needs  . Financial resource strain: Not on file  . Food insecurity:    Worry: Not on file    Inability: Not on file  . Transportation needs:    Medical: Not on file    Non-medical: Not on file  Tobacco Use  . Smoking status: Former Smoker    Packs/day:  1.50    Years: 35.00    Pack years: 52.50    Types: Cigarettes    Last attempt to quit: 08/27/1994    Years since quitting: 23.1  . Smokeless tobacco: Never Used  Substance and Sexual Activity  . Alcohol use: Yes    Alcohol/week: 8.4 oz    Types: 14 Glasses of wine per week  . Drug use: No  . Sexual activity: Not on file  Lifestyle  . Physical activity:    Days per week: Not on file    Minutes per session: Not on file  . Stress: Not on file  Relationships  . Social connections:    Talks on phone: Not on file    Gets together: Not on file    Attends religious service: Not on file    Active member of club or organization: Not on file    Attends meetings of clubs or organizations:  Not on file    Relationship status: Not on file  . Intimate partner violence:    Fear of current or ex partner: Not on file    Emotionally abused: Not on file    Physically abused: Not on file    Forced sexual activity: Not on file  Other Topics Concern  . Not on file  Social History Narrative  . Not on file     Current Outpatient Medications:  .  amLODipine (NORVASC) 10 MG tablet, Take 1 tablet (10 mg total) by mouth daily., Disp: 90 tablet, Rfl: 1 .  aspirin 81 MG tablet, Take 81 mg by mouth daily., Disp: , Rfl:  .  atorvastatin (LIPITOR) 40 MG tablet, Take 1 tablet (40 mg total) by mouth daily., Disp: 90 tablet, Rfl: 1 .  Coenzyme Q10 (CO Q10) 200 MG CAPS, Take 1 capsule by mouth daily., Disp: , Rfl:  .  glipiZIDE (GLUCOTROL) 5 MG tablet, Take 1 tablet (5 mg total) by mouth daily., Disp: 90 tablet, Rfl: 1 .  glucose blood test strip, Use as instructed, Disp: 100 each, Rfl: 12 .  metoprolol succinate (TOPROL-XL) 100 MG 24 hr tablet, TAKE 1 TABLET DAILY,  WITH OR IMMEDIATELY FOLLOWING A MEAL, Disp: 90 tablet, Rfl: 0 .  Multiple Vitamins-Minerals (CENTRUM SILVER PO), Take by mouth daily., Disp: , Rfl:  .  naproxen sodium (ANAPROX) 220 MG tablet, Take 220 mg by mouth 2 (two) times daily with a meal., Disp: , Rfl:  .  Omega-3 Fatty Acids (FISH OIL) 1200 MG CAPS, Take 1,200 mg by mouth daily., Disp: , Rfl:  .  omeprazole (PRILOSEC) 40 MG capsule, Take 1 capsule (40 mg total) by mouth daily., Disp: 90 capsule, Rfl: 1 .  Vitamin D, Cholecalciferol, 1000 UNITS CAPS, Take 1,000 units of lipase by mouth daily., Disp: , Rfl:  .  lisinopril-hydrochlorothiazide (ZESTORETIC) 20-12.5 MG tablet, Take 1 tablet by mouth 2 (two) times daily., Disp: 180 tablet, Rfl: 1 .  metFORMIN (GLUCOPHAGE-XR) 750 MG 24 hr tablet, Take 2 tablets (1,500 mg total) by mouth daily with breakfast., Disp: 180 tablet, Rfl: 1  No Known Allergies   ROS  Constitutional: Negative for fever or weight change.  Respiratory:  Negative for cough and shortness of breath, no orthopnea.   Cardiovascular: Negative for chest pain or palpitations.  Gastrointestinal: Negative for abdominal pain, no bowel changes.  Musculoskeletal: Positive for gait problem- OA hip and knee but no  joint swelling.  Skin: Negative for rash.  Neurological: Negative for dizziness or headache.  No other specific complaints  in a complete review of systems (except as listed in HPI above).  Objective  Vitals:   10/22/17 1008 10/22/17 1119  BP: (!) 150/70 136/70  Pulse: 70   Resp: 14   SpO2: 96%   Weight: 229 lb 4.8 oz (104 kg)   Height: 5\' 6"  (1.676 m)     Body mass index is 37.01 kg/m.  Physical Exam  Constitutional: Patient appears well-developed and well-nourished. Obese  No distress.  HEENT: head atraumatic, normocephalic, pupils equal and reactive to light, neck supple, throat within normal limits Cardiovascular: Normal rate, regular rhythm and normal heart sounds.  No murmur heard. No BLE edema. Pulmonary/Chest: Effort normal and breath sounds normal. No respiratory distress. Abdominal: Soft.  There is no tenderness. Psychiatric: Patient has a normal mood and affect. behavior is normal. Judgment and thought content normal. Muscular Skeletal: no crepitus with extension of knees, but some pain with extension of left knee, normal rom of hip but also causes pain   Recent Results (from the past 2160 hour(s))  POCT Glucose (CBG)     Status: Abnormal   Collection Time: 07/31/17  9:37 AM  Result Value Ref Range   POC Glucose 151 (A) 70 - 99 mg/dl  POCT HgB A1C     Status: Abnormal   Collection Time: 07/31/17  9:45 AM  Result Value Ref Range   Hemoglobin A1C 7.0   Lipid panel     Status: Abnormal   Collection Time: 07/31/17 10:16 AM  Result Value Ref Range   Cholesterol 121 <200 mg/dL   HDL 46 >40 mg/dL   Triglycerides 154 (H) <150 mg/dL   LDL Cholesterol (Calc) 51 mg/dL (calc)    Comment: Reference range: <100 . Desirable  range <100 mg/dL for primary prevention;   <70 mg/dL for patients with CHD or diabetic patients  with > or = 2 CHD risk factors. Marland Kitchen LDL-C is now calculated using the Martin-Hopkins  calculation, which is a validated novel method providing  better accuracy than the Friedewald equation in the  estimation of LDL-C.  Cresenciano Genre et al. Annamaria Helling. 0630;160(10): 2061-2068  (http://education.[employer].com/faq/FAQ164)    Total CHOL/HDL Ratio 2.6 <5.0 (calc)   Non-HDL Cholesterol (Calc) 75 <130 mg/dL (calc)    Comment: For patients with diabetes plus 1 major ASCVD risk  factor, treating to a non-HDL-C goal of <100 mg/dL  (LDL-C of <70 mg/dL) is considered a therapeutic  option.   POCT HgB A1C     Status: Abnormal   Collection Time: 10/22/17 10:12 AM  Result Value Ref Range   Hemoglobin A1C 7.1      PHQ2/9: Depression screen [facility] 2/9 07/31/2017 04/30/2017 12/03/2016 10/11/2016 04/05/2016  Decreased Interest 0 0 0 0 0  Down, Depressed, Hopeless 0 0 0 0 0  PHQ - 2 Score 0 0 0 0 0     Fall Risk: Fall Risk  10/22/2017 07/31/2017 04/30/2017 12/03/2016 10/11/2016  Falls in the past year? No No No No No    Functional Status Survey: Is the patient deaf or have difficulty hearing?: No Does the patient have difficulty seeing, even when wearing glasses/contacts?: No Does the patient have difficulty concentrating, remembering, or making decisions?: No Does the patient have difficulty walking or climbing stairs?: No Does the patient have difficulty dressing or bathing?: No Does the patient have difficulty doing errands alone such as visiting a doctor's office or shopping?: No   Assessment & Plan  1. Diabetes mellitus type 2 in obese [facility])  - POCT  HgB A1C - glipiZIDE (GLUCOTROL) 5 MG tablet; Take 1 tablet (5 mg total) by mouth daily.  Dispense: 90 tablet; Refill: 1 - metFORMIN (GLUCOPHAGE-XR) 750 MG 24 hr tablet; Take 2 tablets (1,500 mg total) by mouth daily with breakfast.  Dispense: 180 tablet;  Refill: 1 - COMPLETE METABOLIC PANEL WITH GFR - Hemoglobin A1c  2. Gastroesophageal reflux disease, esophagitis presence not specified  - omeprazole (PRILOSEC) 40 MG capsule; Take 1 capsule (40 mg total) by mouth daily.  Dispense: 90 capsule; Refill: 1  3. Chronic bronchitis, unspecified chronic bronchitis type ([place])  Doing well at this time, and does not want Spirometry   4. Dyslipidemia associated with type 2 diabetes mellitus (HCC)  - atorvastatin (LIPITOR) 40 MG tablet; Take 1 tablet (40 mg total) by mouth daily.  Dispense: 90 tablet; Refill: 1 - Hemoglobin A1c  5. Essential hypertension  - amLODipine (NORVASC) 10 MG tablet; Take 1 tablet (10 mg total) by mouth daily.  Dispense: 90 tablet; Refill: 1 - lisinopril-hydrochlorothiazide (ZESTORETIC) 20-12.5 MG tablet; Take 1 tablet by mouth 2 (two) times daily.  Dispense: 180 tablet; Refill: 1 - COMPLETE METABOLIC PANEL WITH GFR  6. Obstructive sleep apnea on CPAP  Continue CPAP   7. Morbid obesity ([place])  Discussed with the patient the risk posed by an increased BMI. Discussed importance of portion control, calorie counting and at least 150 minutes of physical activity weekly. Avoid sweet beverages and drink more water. Eat at least 6 servings of fruit and vegetables daily   8. History of myocardial infarct at age less than 40 years  Sees Dr. Rockey Situ   9. Primary osteoarthritis involving multiple joints  - Ambulatory referral to Orthopedic Surgery

## 2017-10-22 NOTE — Patient Instructions (Signed)
We will replaced Januvia and Metformin with Metformin 750 mg to take two daily We will replaced lisinopril 20 mg and HCTZ 25 with lisinopril 20/12.5 two daily

## 2017-11-04 DIAGNOSIS — M1712 Unilateral primary osteoarthritis, left knee: Secondary | ICD-10-CM | POA: Diagnosis not present

## 2017-11-04 DIAGNOSIS — M1611 Unilateral primary osteoarthritis, right hip: Secondary | ICD-10-CM | POA: Diagnosis not present

## 2017-11-04 DIAGNOSIS — M160 Bilateral primary osteoarthritis of hip: Secondary | ICD-10-CM | POA: Diagnosis not present

## 2017-11-04 DIAGNOSIS — M170 Bilateral primary osteoarthritis of knee: Secondary | ICD-10-CM | POA: Diagnosis not present

## 2017-11-04 DIAGNOSIS — M169 Osteoarthritis of hip, unspecified: Secondary | ICD-10-CM | POA: Insufficient documentation

## 2017-11-09 NOTE — Progress Notes (Signed)
Cardiology Office Note  Date:  11/11/2017   ID:  Jason Farrell, DOB [birth year], MRN [id number]  PCP:  Steele Sizer, MD   Chief Complaint  Patient presents with  . Other    12 month follow up. Meds reviewed by the pt. verbally. Pt. c/o shortness of breath.     HPI:  Jason Farrell is a pleasant 77 year old gentleman with a history of coronary artery disease,  stent 3 in 1996 after MI,  stent 2 in 2006 for in-stent restenosis,  smoking history of 30-35 years, quit in 1996 diabetes  who presents for follow-up of his coronary artery disease.  In follow-up he denies any significant chest pain  Previously had anginal symptoms chest pain arm pain back in 2006 Recently push mowing Sometimes has to stop for shortness of breath which she attributes to conditioning  Reports he might need total knee replacement but is trying to delay as long as he can Previously reported having bilateral hip pain, unable to exercise on a regular basis  Sister with lung cancer now in remission Reports he stopped smoking 1996  Lab work reviewed with him Hemoglobin A1c 7.1, Eats lots of potatoes  tolerating Lipitor 40 mg daily Cholesterol at goal  EKG on today's visit shows normal sinus rhythm with rate 64 bpm, right bundle branch block  Other past medical history He stopped smoking in 1996 after his MI . Smoked 38 yrs   PMH:   has a past medical history of Coronary artery disease, Diabetes mellitus without complication ([place]), Dyslipidemia, GERD (gastroesophageal reflux disease), Hyperlipidemia, Hypertension, MI (myocardial infarction) ([place]), and Osteoarthritis.  PSH:    Past Surgical History:  Procedure Laterality Date  . CARDIAC CATHETERIZATION  2006   stent placement  . COLONOSCOPY    . CORONARY ANGIOPLASTY WITH STENT PLACEMENT  2006   stent x 2   . CORONARY ANGIOPLASTY WITH STENT PLACEMENT  1996   stent x 3   . FOOT SURGERY Right   . HALLUX VALGUS CHEILECTOMY Right 02/25/2015   Procedure: HALLUX VALGUS CHEILECTOMY;  Surgeon: [name], DPM;  Location: [facility] ORS;  Service: Podiatry;  Laterality: Right;    Current Outpatient Medications  Medication Sig Dispense Refill  . amLODipine (NORVASC) 10 MG tablet Take 1 tablet (10 mg total) by mouth daily. 90 tablet 1  . aspirin 81 MG tablet Take 81 mg by mouth daily.    Marland Kitchen atorvastatin (LIPITOR) 40 MG tablet Take 1 tablet (40 mg total) by mouth daily. 90 tablet 1  . Coenzyme Q10 (CO Q10) 200 MG CAPS Take 1 capsule by mouth daily.    Marland Kitchen glipiZIDE (GLUCOTROL) 5 MG tablet Take 1 tablet (5 mg total) by mouth daily. 90 tablet 1  . glucose blood test strip Use as instructed 100 each 12  . lisinopril-hydrochlorothiazide (ZESTORETIC) 20-12.5 MG tablet Take 1 tablet by mouth 2 (two) times daily. 180 tablet 1  . metFORMIN (GLUCOPHAGE-XR) 750 MG 24 hr tablet Take 2 tablets (1,500 mg total) by mouth daily with breakfast. 180 tablet 1  . metoprolol succinate (TOPROL-XL) 100 MG 24 hr tablet TAKE 1 TABLET DAILY,  WITH OR IMMEDIATELY FOLLOWING A MEAL 90 tablet 0  . Multiple Vitamins-Minerals (CENTRUM SILVER PO) Take by mouth daily.    . naproxen sodium (ANAPROX) 220 MG tablet Take 220 mg by mouth 2 (two) times daily with a meal.    . Omega-3 Fatty Acids (FISH OIL) 1200 MG CAPS Take 1,200 mg by mouth daily.    Marland Kitchen omeprazole (  PRILOSEC) 40 MG capsule Take 1 capsule (40 mg total) by mouth daily. 90 capsule 1  . sitaGLIPtin (JANUVIA) 100 MG tablet Take 100 mg by mouth daily.    . Vitamin D, Cholecalciferol, 1000 UNITS CAPS Take 1,000 units of lipase by mouth daily.     No current facility-administered medications for this visit.      Allergies:   Patient has no known allergies.   Social History:  The patient  reports that he quit smoking about 23 years ago. His smoking use included cigarettes. He has a 52.50 pack-year smoking history. He has never used smokeless tobacco. He reports that he drinks about 8.4 oz of alcohol per week. He reports  that he does not use drugs.   Family History:   family history includes Heart disease in his sister; Heart failure in his sister; Hyperlipidemia in his father and sister; Hypertension in his father, sister, and sister.    Review of Systems: Review of Systems  Constitutional: Negative.   Respiratory: Positive for shortness of breath.   Cardiovascular: Negative.   Gastrointestinal: Negative.   Musculoskeletal: Positive for joint pain.  Neurological: Negative.   Psychiatric/Behavioral: Negative.   All other systems reviewed and are negative.    PHYSICAL EXAM: VS:  BP 138/60 (BP Location: Left Arm, Patient Position: Sitting, Cuff Size: Normal)   Pulse 64   Ht 5\' 6"  (1.676 m)   Wt 227 lb 8 oz (103.2 kg)   BMI 36.72 kg/m  , BMI Body mass index is 36.72 kg/m. Constitutional:  oriented to person, place, and time. No distress. Obese HENT:  Head: Normocephalic and atraumatic.  Eyes:  no discharge. No scleral icterus.  Neck: Normal range of motion. Neck supple. No JVD present.  Cardiovascular: Normal rate, regular rhythm, normal heart sounds and intact distal pulses. Exam reveals no gallop and no friction rub. No edema No murmur heard. Pulmonary/Chest: Effort normal and breath sounds normal. No stridor. No respiratory distress.  no wheezes.  no rales.  no tenderness.  Abdominal: Soft.  no distension.  no tenderness.  Musculoskeletal: Normal range of motion.  no  tenderness or deformity.  Neurological:  normal muscle tone. Coordination normal. No atrophy Skin: Skin is warm and dry. No rash noted. not diaphoretic.  Psychiatric:  normal mood and affect. behavior is normal. Thought content normal.    Recent Labs: 10/22/2017: ALT 15; BUN 16; Creat 1.04; Potassium 4.5; Sodium 140    Lipid Panel Lab Results  Component Value Date   CHOL 121 07/31/2017   HDL 46 07/31/2017   LDLCALC 51 07/31/2017   TRIG 154 (H) 07/31/2017      Wt Readings from Last 3 Encounters:  11/11/17 227 lb 8 oz  (103.2 kg)  10/22/17 229 lb 4.8 oz (104 kg)  07/31/17 227 lb 12.8 oz (103.3 kg)       ASSESSMENT AND PLAN:  Coronary artery disease involving native coronary artery of native heart without angina pectoris - Plan: EKG 12-Lead Denies any of his previous anginal symptoms even on exertion Recommended weight loss, walking program for conditioning  Essential hypertension - Plan: EKG 12-Lead Blood pressure well controlled, no medication changes made Recommended weight loss, low-salt diet  Mixed hyperlipidemia Numbers at goal on Lipitor 40 Recommended weight loss and walking program  Controlled type 2 diabetes mellitus without complication, without long-term current use of insulin (HCC) Hemoglobin A1c has climbed up to 7.1 Poor dietary no discretion, Recommended low carbohydrate diet , regular walking program  Class  2 obesity due to excess calories without serious comorbidity in adult, unspecified BMI We have encouraged continued exercise, careful diet management in an effort to lose weight.  Weight still high  Centrilobular emphysema ([place]) Reports he is doing well, does not use oxygen 38 years of smoking Chronic mild shortness of breath on heavy exertion  Obstructive sleep apnea on CPAP Stable, on CPAP   Total encounter time more than 25 minutes  Greater than 50% was spent in counseling and coordination of care with the patient   Disposition:   F/U  12 months   Orders Placed This Encounter  Procedures  . EKG 12-Lead     Signed, Esmond Plants, M.D., Ph.D. 11/11/2017  [facility], [place]

## 2017-11-11 ENCOUNTER — Encounter: Payer: Self-pay | Admitting: Cardiovascular Disease

## 2017-11-11 ENCOUNTER — Ambulatory Visit (INDEPENDENT_AMBULATORY_CARE_PROVIDER_SITE_OTHER): Payer: Medicare Other | Admitting: Cardiovascular Disease

## 2017-11-11 VITALS — BP 138/60 | HR 64 | Ht 66.0 in | Wt 227.5 lb

## 2017-11-11 DIAGNOSIS — J432 Centrilobular emphysema: Secondary | ICD-10-CM | POA: Diagnosis not present

## 2017-11-11 DIAGNOSIS — E1169 Type 2 diabetes mellitus with other specified complication: Secondary | ICD-10-CM

## 2017-11-11 DIAGNOSIS — E669 Obesity, unspecified: Secondary | ICD-10-CM | POA: Diagnosis not present

## 2017-11-11 DIAGNOSIS — Z9989 Dependence on other enabling machines and devices: Secondary | ICD-10-CM

## 2017-11-11 DIAGNOSIS — I10 Essential (primary) hypertension: Secondary | ICD-10-CM

## 2017-11-11 DIAGNOSIS — E782 Mixed hyperlipidemia: Secondary | ICD-10-CM | POA: Diagnosis not present

## 2017-11-11 DIAGNOSIS — E119 Type 2 diabetes mellitus without complications: Secondary | ICD-10-CM

## 2017-11-11 DIAGNOSIS — I25118 Atherosclerotic heart disease of native coronary artery with other forms of angina pectoris: Secondary | ICD-10-CM

## 2017-11-11 DIAGNOSIS — G4733 Obstructive sleep apnea (adult) (pediatric): Secondary | ICD-10-CM | POA: Diagnosis not present

## 2017-11-11 NOTE — Patient Instructions (Signed)

## 2017-12-03 DIAGNOSIS — M1712 Unilateral primary osteoarthritis, left knee: Secondary | ICD-10-CM | POA: Diagnosis not present

## 2017-12-13 ENCOUNTER — Other Ambulatory Visit: Payer: Self-pay | Admitting: Family Medicine

## 2017-12-13 MED ORDER — SITAGLIPTIN PHOSPHATE 100 MG PO TABS: 100.0000 mg | ORAL_TABLET | Freq: Every day | ORAL | 0 refills | Status: DC

## 2017-12-13 NOTE — Telephone Encounter (Signed)
Request for diabetes medication. Januvia  Last office visit pertaining to diabetes: 10/22/2017   Lab Results  Component Value Date   HGBA1C 7.1 10/22/2017    Follow up 02/21/18

## 2017-12-13 NOTE — Telephone Encounter (Signed)
Pt needs refill on Januvia. To be sent to [facility].

## 2018-01-07 DIAGNOSIS — D225 Melanocytic nevi of trunk: Secondary | ICD-10-CM | POA: Diagnosis not present

## 2018-01-07 DIAGNOSIS — Z85828 Personal history of other malignant neoplasm of skin: Secondary | ICD-10-CM | POA: Diagnosis not present

## 2018-01-07 DIAGNOSIS — L821 Other seborrheic keratosis: Secondary | ICD-10-CM | POA: Diagnosis not present

## 2018-01-07 DIAGNOSIS — D2272 Melanocytic nevi of left lower limb, including hip: Secondary | ICD-10-CM | POA: Diagnosis not present

## 2018-01-07 DIAGNOSIS — X32XXXA Exposure to sunlight, initial encounter: Secondary | ICD-10-CM | POA: Diagnosis not present

## 2018-01-07 DIAGNOSIS — D2271 Melanocytic nevi of right lower limb, including hip: Secondary | ICD-10-CM | POA: Diagnosis not present

## 2018-01-07 DIAGNOSIS — Z08 Encounter for follow-up examination after completed treatment for malignant neoplasm: Secondary | ICD-10-CM | POA: Diagnosis not present

## 2018-01-07 DIAGNOSIS — D2262 Melanocytic nevi of left upper limb, including shoulder: Secondary | ICD-10-CM | POA: Diagnosis not present

## 2018-01-07 DIAGNOSIS — B356 Tinea cruris: Secondary | ICD-10-CM | POA: Diagnosis not present

## 2018-01-07 DIAGNOSIS — L570 Actinic keratosis: Secondary | ICD-10-CM | POA: Diagnosis not present

## 2018-01-07 DIAGNOSIS — D2261 Melanocytic nevi of right upper limb, including shoulder: Secondary | ICD-10-CM | POA: Diagnosis not present

## 2018-01-18 ENCOUNTER — Other Ambulatory Visit: Payer: Self-pay | Admitting: Family Medicine

## 2018-01-23 ENCOUNTER — Ambulatory Visit (INDEPENDENT_AMBULATORY_CARE_PROVIDER_SITE_OTHER): Payer: Medicare Other

## 2018-01-23 VITALS — BP 132/60 | HR 67 | Temp 98.2°F | Resp 12 | Ht 66.0 in | Wt 231.3 lb

## 2018-01-23 DIAGNOSIS — Z0000 Encounter for general adult medical examination without abnormal findings: Secondary | ICD-10-CM | POA: Diagnosis not present

## 2018-01-23 NOTE — Patient Instructions (Signed)
Jason Farrell , Thank you for taking time to come for your Medicare Wellness Visit. I appreciate your ongoing commitment to your health goals. Please review the following plan we discussed and let me know if I can assist you in the future.   Screening recommendations/referrals: Colorectal Screening: Up to date Lung Cancer Screening: You do not qualify for this screening Hepatitis C Screening: You do not qualify for this screening  Vision and Dental Exams: Recommended annual ophthalmology exams for early detection of glaucoma and other disorders of the eye Recommended annual dental exams for proper oral hygiene  Diabetic Exams: Recommended annual diabetic eye exams for early detection of retinopathy Recommended annual diabetic foot exams for early detection of peripheral neuropathy.  Diabetic Eye Exam: Please schedule an appointment with your ophthalmologist Diabetic Foot Exam: Please keep your appointment as scheduled with Dr. Ancil Boozer  Vaccinations: Influenza vaccine: Up to date Pneumococcal vaccine: Up to date Tdap vaccine: Up to date Shingles vaccine: Please call your insurance company to determine your out of pocket expense for the Shingrix vaccine. You may also receive this vaccine at your local pharmacy or Health Dept.  Advanced directives: Advance directive discussed with you today. I have provided a copy for you to complete at home and have notarized. Once this is complete please bring a copy in to our office so we can scan it into your chart.  Goals: Recommend to drink at least 6-8 8oz glasses of water per day.  Next appointment: Please schedule your Annual Wellness Visit with your Nurse Health Advisor in one year.  Preventive Care 44 Years and Older, Male Preventive care refers to lifestyle choices and visits with your health care provider that can promote health and wellness. What does preventive care include?  A yearly physical exam. This is also called an annual well  check.  Dental exams once or twice a year.  Routine eye exams. Ask your health care provider how often you should have your eyes checked.  Personal lifestyle choices, including:  Daily care of your teeth and gums.  Regular physical activity.  Eating a healthy diet.  Avoiding tobacco and drug use.  Limiting alcohol use.  Practicing safe sex.  Taking low doses of aspirin every day.  Taking vitamin and mineral supplements as recommended by your health care provider. What happens during an annual well check? The services and screenings done by your health care provider during your annual well check will depend on your age, overall health, lifestyle risk factors, and family history of disease. Counseling  Your health care provider may ask you questions about your:  Alcohol use.  Tobacco use.  Drug use.  Emotional well-being.  Home and relationship well-being.  Sexual activity.  Eating habits.  History of falls.  Memory and ability to understand (cognition).  Work and work Statistician. Screening  You may have the following tests or measurements:  Height, weight, and BMI.  Blood pressure.  Lipid and cholesterol levels. These may be checked every 5 years, or more frequently if you are over 69 years old.  Skin check.  Lung cancer screening. You may have this screening every year starting at age 59 if you have a 30-pack-year history of smoking and currently smoke or have quit within the past 15 years.  Fecal occult blood test (FOBT) of the stool. You may have this test every year starting at age 48.  Flexible sigmoidoscopy or colonoscopy. You may have a sigmoidoscopy every 5 years or a colonoscopy every 10  years starting at age 55.  Prostate cancer screening. Recommendations will vary depending on your family history and other risks.  Hepatitis C blood test.  Hepatitis B blood test.  Sexually transmitted disease (STD) testing.  Diabetes screening. This is  done by checking your blood sugar (glucose) after you have not eaten for a while (fasting). You may have this done every 1-3 years.  Abdominal aortic aneurysm (AAA) screening. You may need this if you are a current or former smoker.  Osteoporosis. You may be screened starting at age 62 if you are at high risk. Talk with your health care provider about your test results, treatment options, and if necessary, the need for more tests. Vaccines  Your health care provider may recommend certain vaccines, such as:  Influenza vaccine. This is recommended every year.  Tetanus, diphtheria, and acellular pertussis (Tdap, Td) vaccine. You may need a Td booster every 10 years.  Zoster vaccine. You may need this after age 40.  Pneumococcal 13-valent conjugate (PCV13) vaccine. One dose is recommended after age 68.  Pneumococcal polysaccharide (PPSV23) vaccine. One dose is recommended after age 61. Talk to your health care provider about which screenings and vaccines you need and how often you need them. This information is not intended to replace advice given to you by your health care provider. Make sure you discuss any questions you have with your health care provider. Document Released: 07/29/2015 Document Revised: 03/21/2016 Document Reviewed: 05/03/2015 Elsevier Interactive Patient Education  2017 [place] Prevention in the Home Falls can cause injuries. They can happen to people of all ages. There are many things you can do to make your home safe and to help prevent falls. What can I do on the outside of my home?  Regularly fix the edges of walkways and driveways and fix any cracks.  Remove anything that might make you trip as you walk through a door, such as a raised step or threshold.  Trim any bushes or trees on the path to your home.  Use bright outdoor lighting.  Clear any walking paths of anything that might make someone trip, such as rocks or tools.  Regularly check to  see if handrails are loose or broken. Make sure that both sides of any steps have handrails.  Any raised decks and porches should have guardrails on the edges.  Have any leaves, snow, or ice cleared regularly.  Use sand or salt on walking paths during winter.  Clean up any spills in your garage right away. This includes oil or grease spills. What can I do in the bathroom?  Use night lights.  Install grab bars by the toilet and in the tub and shower. Do not use towel bars as grab bars.  Use non-skid mats or decals in the tub or shower.  If you need to sit down in the shower, use a plastic, non-slip stool.  Keep the floor dry. Clean up any water that spills on the floor as soon as it happens.  Remove soap buildup in the tub or shower regularly.  Attach bath mats securely with double-sided non-slip rug tape.  Do not have throw rugs and other things on the floor that can make you trip. What can I do in the bedroom?  Use night lights.  Make sure that you have a light by your bed that is easy to reach.  Do not use any sheets or blankets that are too big for your bed. They should not hang  down onto the floor.  Have a firm chair that has side arms. You can use this for support while you get dressed.  Do not have throw rugs and other things on the floor that can make you trip. What can I do in the kitchen?  Clean up any spills right away.  Avoid walking on wet floors.  Keep items that you use a lot in easy-to-reach places.  If you need to reach something above you, use a strong step stool that has a grab bar.  Keep electrical cords out of the way.  Do not use floor polish or wax that makes floors slippery. If you must use wax, use non-skid floor wax.  Do not have throw rugs and other things on the floor that can make you trip. What can I do with my stairs?  Do not leave any items on the stairs.  Make sure that there are handrails on both sides of the stairs and use  them. Fix handrails that are broken or loose. Make sure that handrails are as long as the stairways.  Check any carpeting to make sure that it is firmly attached to the stairs. Fix any carpet that is loose or worn.  Avoid having throw rugs at the top or bottom of the stairs. If you do have throw rugs, attach them to the floor with carpet tape.  Make sure that you have a light switch at the top of the stairs and the bottom of the stairs. If you do not have them, ask someone to add them for you. What else can I do to help prevent falls?  Wear shoes that:  Do not have high heels.  Have rubber bottoms.  Are comfortable and fit you well.  Are closed at the toe. Do not wear sandals.  If you use a stepladder:  Make sure that it is fully opened. Do not climb a closed stepladder.  Make sure that both sides of the stepladder are locked into place.  Ask someone to hold it for you, if possible.  Clearly mark and make sure that you can see:  Any grab bars or handrails.  First and last steps.  Where the edge of each step is.  Use tools that help you move around (mobility aids) if they are needed. These include:  Canes.  Walkers.  Scooters.  Crutches.  Turn on the lights when you go into a dark area. Replace any light bulbs as soon as they burn out.  Set up your furniture so you have a clear path. Avoid moving your furniture around.  If any of your floors are uneven, fix them.  If there are any pets around you, be aware of where they are.  Review your medicines with your doctor. Some medicines can make you feel dizzy. This can increase your chance of falling. Ask your doctor what other things that you can do to help prevent falls. This information is not intended to replace advice given to you by your health care provider. Make sure you discuss any questions you have with your health care provider. Document Released: 04/28/2009 Document Revised: 12/08/2015 Document Reviewed:  08/06/2014 Elsevier Interactive Patient Education  2017 [employer].

## 2018-01-23 NOTE — Progress Notes (Addendum)
Subjective:   Jason Farrell is a 77 y.o. male who presents for Medicare Annual/Subsequent preventive examination.  Review of Systems:  N/A Cardiac Risk Factors include: advanced age (>73men, >20 women);diabetes mellitus;dyslipidemia;hypertension;male gender;obesity (BMI >30kg/m2);sedentary lifestyle     Objective:    Vitals: BP 132/60 (BP Location: Left Arm, Patient Position: Sitting, Cuff Size: Large)   Pulse 67   Temp 98.2 F (36.8 C) (Oral)   Resp 12   Ht 5\' 6"  (1.676 m)   Wt 231 lb 4.8 oz (104.9 kg)   SpO2 94%   BMI 37.33 kg/m   Body mass index is 37.33 kg/m.  Advanced Directives 01/23/2018 04/30/2017 01/17/2017 12/03/2016 10/11/2016 07/10/2016 04/05/2016  Does Patient Have a Medical Advance Directive? No No No No No No No  Would patient like information on creating a medical advance directive? Yes (MAU/Ambulatory/Procedural Areas - Information given) - - No - Patient declined - - No - patient declined information    Tobacco Social History   Tobacco Use  Smoking Status Former Smoker  . Packs/day: 1.50  . Years: 35.00  . Pack years: 52.50  . Types: Cigarettes  . Last attempt to quit: 08/27/1994  . Years since quitting: 23.4  Smokeless Tobacco Never Used  Tobacco Comment   smoking cessation materials not required     Counseling given: No Comment: smoking cessation materials not required  Clinical Intake:  Pre-visit preparation completed: Yes  Pain : No/denies pain   BMI - recorded: 36.72 Nutritional Status: BMI > 30  Obese Nutritional Risks: None  Nutrition Risk Assessment: Has the patient had any N/V/D within the last 2 months?  No Does the patient have any non-healing wounds?  No Has the patient had any unintentional weight loss or weight gain?  No  Is the patient diabetic?  Yes If diabetic, was a CBG obtained today?  No Did the patient bring in their glucometer from home?  No Comments: Pt monitors CBG's once per week. Denies any financial strains with  the device or supplies.  Diabetic Exams: Diabetic Eye Exam: Completed 03/20/17. Pt states he is schedule at the end of Sept for completion. Diabetic Foot Exam: Completed 04/30/17. Pt is scheduled for diabetic foot exam on 02/21/18 with Dr. Ancil Boozer.  How often do you need to have someone help you when you read instructions, pamphlets, or other written materials from your doctor or pharmacy?: 1 - Never  Interpreter Needed?: No  Information entered by :: Idell Pickles, LPN  Past Medical History:  Diagnosis Date  . Coronary artery disease   . Diabetes mellitus without complication ([place])   . Dyslipidemia   . GERD (gastroesophageal reflux disease)   . Hyperlipidemia   . Hypertension   . MI (myocardial infarction) ([place])   . Osteoarthritis    Past Surgical History:  Procedure Laterality Date  . CARDIAC CATHETERIZATION  2006   stent placement  . COLONOSCOPY    . CORONARY ANGIOPLASTY WITH STENT PLACEMENT  2006   stent x 2   . CORONARY ANGIOPLASTY WITH STENT PLACEMENT  1996   stent x 3   . FOOT SURGERY Right   . 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 Hyperlipidemia Father   . Hypertension Father   . Alcohol abuse Father   . Cancer Sister  lung  . Hypertension Sister   . Heart disease Sister   . Hyperlipidemia Sister   . COPD Sister    Social History   Socioeconomic History  . Marital status: Married    Spouse name: Manuela Schwartz  . Number of children: 3  . Years of education: some college  . Highest education level: 12th grade  Occupational History  . Occupation: Retired  Scientific laboratory technician  . Financial resource strain: Not hard at all  . Food insecurity:    Worry: Never true    Inability: Never true  . Transportation needs:    Medical: No    Non-medical: No  Tobacco Use  . Smoking status:  Former Smoker    Packs/day: 1.50    Years: 35.00    Pack years: 52.50    Types: Cigarettes    Last attempt to quit: 08/27/1994    Years since quitting: 23.4  . Smokeless tobacco: Never Used  . Tobacco comment: smoking cessation materials not required  Substance and Sexual Activity  . Alcohol use: Yes    Alcohol/week: 8.4 oz    Types: 14 Glasses of wine per week  . Drug use: No  . Sexual activity: Not Currently  Lifestyle  . Physical activity:    Days per week: 3 days    Minutes per session: 30 min  . Stress: Not at all  Relationships  . Social connections:    Talks on phone: Patient refused    Gets together: Patient refused    Attends religious service: Patient refused    Active member of club or organization: Patient refused    Attends meetings of clubs or organizations: Patient refused    Relationship status: Married  Other Topics Concern  . Not on file  Social History Narrative  . Not on file    Outpatient Encounter Medications as of 01/23/2018  Medication Sig  . amLODipine (NORVASC) 10 MG tablet Take 1 tablet (10 mg total) by mouth daily.  Marland Kitchen aspirin 81 MG tablet Take 81 mg by mouth daily.  Marland Kitchen atorvastatin (LIPITOR) 40 MG tablet Take 1 tablet (40 mg total) by mouth daily.  . Coenzyme Q10 (CO Q10) 200 MG CAPS Take 1 capsule by mouth daily.  Marland Kitchen glipiZIDE (GLUCOTROL) 5 MG tablet Take 1 tablet (5 mg total) by mouth daily.  Marland Kitchen glucose blood test strip Use as instructed  . lisinopril-hydrochlorothiazide (ZESTORETIC) 20-12.5 MG tablet Take 1 tablet by mouth 2 (two) times daily.  . metFORMIN (GLUCOPHAGE-XR) 750 MG 24 hr tablet Take 2 tablets (1,500 mg total) by mouth daily with breakfast.  . metoprolol succinate (TOPROL-XL) 100 MG 24 hr tablet TAKE 1 TABLET DAILY WITH OR IMMEDIATELY FOLLOWING A MEAL  . Multiple Vitamins-Minerals (CENTRUM SILVER PO) Take by mouth daily.  . naproxen sodium (ANAPROX) 220 MG tablet Take 220 mg by mouth 2 (two) times daily with a meal.  . Omega-3  Fatty Acids (FISH OIL) 1200 MG CAPS Take 1,200 mg by mouth daily.  Marland Kitchen omeprazole (PRILOSEC) 40 MG capsule Take 1 capsule (40 mg total) by mouth daily.  . sitaGLIPtin (JANUVIA) 100 MG tablet Take 1 tablet (100 mg total) by mouth daily.  . Vitamin D, Cholecalciferol, 1000 UNITS CAPS Take 1,000 units of lipase by mouth daily.   No facility-administered encounter medications on file as of 01/23/2018.     Activities of Daily Living In your present state of health, do you have any difficulty performing the following activities: 01/23/2018 10/22/2017  Hearing? N N  Comment denies hearing aids -  Vision? N N  Comment wears eyeglasses -  Difficulty concentrating or making decisions? Y N  Comment short term memory loss -  Walking or climbing stairs? Y N  Comment back pain -  Dressing or bathing? N N  Doing errands, shopping? N N  Preparing Food and eating ? N -  Comment denies dentures -  Using the Toilet? N -  In the past six months, have you accidently leaked urine? Y -  Comment urgency -  Do you have problems with loss of bowel control? N -  Managing your Medications? N -  Managing your Finances? N -  Housekeeping or managing your Housekeeping? N -  Some recent data might be hidden    Patient Care Team: Steele Sizer, MD as PCP - General (Family Medicine) Minna Merritts, MD as Consulting Physician (Cardiology)   Assessment:   This is a routine wellness examination for Fred.  Exercise Activities and Dietary recommendations Current Exercise Habits: Home exercise routine, Type of exercise: walking, Time (Minutes): 30, Frequency (Times/Week): 3, Weekly Exercise (Minutes/Week): 90, Intensity: Mild, Exercise limited by: None identified  Goals    . DIET - INCREASE WATER INTAKE     Recommend to drink at least 6-8 8oz glasses of water per day.       Fall Risk Fall Risk  01/23/2018 10/22/2017 07/31/2017 04/30/2017 12/03/2016  Falls in the past year? No No No No No  Risk for fall due to :  Impaired vision;Impaired balance/gait - - - -  Risk for fall due to: Comment wears eyeglasses; joint pain - - - -   FALL RISK PREVENTION PERTAINING TO HOME: Is your home free of loose throw rugs in walkways, pet beds, electrical cords, etc? Yes Is there adequate lighting in your home to reduce risk of falls?  Yes Are there stairs in or around your home WITH handrails? Yes  ASSISTIVE DEVICES UTILIZED TO PREVENT FALLS: Use of a cane, walker or w/c? No Grab bars in the bathroom? No  Shower chair or a place to sit while bathing? No An elevated toilet seat or a handicapped toilet? No  Timed Get Up and Go Performed: Yes. Pt ambulated 10 feet within 11 sec. Gait slow, steady and without the use of an assistive device. No intervention required at this time. Fall risk prevention has been discussed.  Community Resource Referral:  Pt declined my offer to send [employer] Referral to Care Guide for installation of grab bars in the shower, shower chair or an elevated toilet seat.  Depression Screen PHQ 2/9 Scores 01/23/2018 07/31/2017 04/30/2017 12/03/2016  PHQ - 2 Score 0 0 0 0  PHQ- 9 Score 0 - - -    Cognitive Function     6CIT Screen 01/23/2018 12/03/2016  What Year? 0 points 0 points  What month? 0 points 0 points  What time? 0 points 0 points  Count back from 20 0 points 0 points  Months in reverse 2 points 2 points  Repeat phrase 2 points 4 points  Total Score 4 6    Immunization History  Administered Date(s) Administered  . Influenza, Seasonal, Injecte, Preservative Fre 05/10/2014  . Influenza-Unspecified 04/15/2015, 03/21/2016  . Pneumococcal Conjugate-13 07/10/2016  . Pneumococcal Polysaccharide-23 07/22/2009    Qualifies for Shingles Vaccine? Yes. Due for Shingrix. Education has been provided regarding the importance of this vaccine. Pt has been advised to call his insurance company to determine his out of pocket expense.  Advised he may also receive this vaccine at his  local pharmacy or Health Dept. Verbalized acceptance and understanding.  Screening Tests Health Maintenance  Topic Date Due  . INFLUENZA VACCINE  02/13/2018  . OPHTHALMOLOGY EXAM  03/20/2018  . HEMOGLOBIN A1C  04/23/2018  . FOOT EXAM  04/30/2018  . TETANUS/TDAP  07/16/2021  . COLONOSCOPY  09/14/2023  . PNA vac Low Risk Adult  Completed   Cancer Screenings: Lung: Low Dose CT Chest recommended if Age 58-80 years, 30 pack-year currently smoking OR have quit w/in 15years. Patient does not qualify. Colorectal: Completed 09/13/13. Repeat every 10 years  Additional Screenings: Hepatitis C Screening: Does not qualify     Plan:  I have personally reviewed and addressed the Medicare Annual Wellness questionnaire and have noted the following in the patient's chart:  A. Medical and social history B. Use of alcohol, tobacco or illicit drugs  C. Current medications and supplements D. Functional ability and status E.  Nutritional status F.  Physical activity G. Advance directives H. List of other physicians I.  Hospitalizations, surgeries, and ER visits in previous 12 months J.  [place] such as hearing and vision if needed, cognitive and depression L. Referrals and appointments  In addition, I have reviewed and discussed with patient certain preventive protocols, quality metrics, and best practice recommendations. A written personalized care plan for preventive services as well as general preventive health recommendations were provided to patient.  See attached scanned questionnaire for additional information.   Signed,  Aleatha Borer, LPN Nurse Health Advisor  I have reviewed this encounter including the documentation in this note and/or discussed this patient with the provider, Aleatha Borer, LPN. I am certifying that I agree with the content of this note as supervising physician.  Steele Sizer, MD [facility] 01/23/2018, 12:59  PM

## 2018-02-21 ENCOUNTER — Ambulatory Visit (INDEPENDENT_AMBULATORY_CARE_PROVIDER_SITE_OTHER): Payer: Medicare Other | Admitting: Family Medicine

## 2018-02-21 ENCOUNTER — Encounter: Payer: Self-pay | Admitting: Family Medicine

## 2018-02-21 VITALS — BP 116/64 | HR 81 | Temp 97.8°F | Resp 16 | Ht 66.0 in | Wt 231.5 lb

## 2018-02-21 DIAGNOSIS — M150 Primary generalized (osteo)arthritis: Secondary | ICD-10-CM | POA: Diagnosis not present

## 2018-02-21 DIAGNOSIS — K219 Gastro-esophageal reflux disease without esophagitis: Secondary | ICD-10-CM | POA: Diagnosis not present

## 2018-02-21 DIAGNOSIS — E1129 Type 2 diabetes mellitus with other diabetic kidney complication: Secondary | ICD-10-CM

## 2018-02-21 DIAGNOSIS — D692 Other nonthrombocytopenic purpura: Secondary | ICD-10-CM | POA: Diagnosis not present

## 2018-02-21 DIAGNOSIS — I252 Old myocardial infarction: Secondary | ICD-10-CM | POA: Diagnosis not present

## 2018-02-21 DIAGNOSIS — E1169 Type 2 diabetes mellitus with other specified complication: Secondary | ICD-10-CM

## 2018-02-21 DIAGNOSIS — Z9989 Dependence on other enabling machines and devices: Secondary | ICD-10-CM

## 2018-02-21 DIAGNOSIS — M159 Polyosteoarthritis, unspecified: Secondary | ICD-10-CM

## 2018-02-21 DIAGNOSIS — E785 Hyperlipidemia, unspecified: Secondary | ICD-10-CM | POA: Diagnosis not present

## 2018-02-21 DIAGNOSIS — I25118 Atherosclerotic heart disease of native coronary artery with other forms of angina pectoris: Secondary | ICD-10-CM

## 2018-02-21 DIAGNOSIS — J42 Unspecified chronic bronchitis: Secondary | ICD-10-CM

## 2018-02-21 DIAGNOSIS — I10 Essential (primary) hypertension: Secondary | ICD-10-CM | POA: Diagnosis not present

## 2018-02-21 DIAGNOSIS — E669 Obesity, unspecified: Secondary | ICD-10-CM

## 2018-02-21 DIAGNOSIS — G4733 Obstructive sleep apnea (adult) (pediatric): Secondary | ICD-10-CM | POA: Diagnosis not present

## 2018-02-21 DIAGNOSIS — R809 Proteinuria, unspecified: Secondary | ICD-10-CM

## 2018-02-21 LAB — POCT GLYCOSYLATED HEMOGLOBIN (HGB A1C): HEMOGLOBIN A1C: 7.1 % — AB (ref 4.0–5.6)

## 2018-02-21 LAB — POCT UA - MICROALBUMIN: MICROALBUMIN (UR) POC: 50 mg/L

## 2018-02-21 MED ORDER — LISINOPRIL-HYDROCHLOROTHIAZIDE 20-12.5 MG PO TABS
1.0000 | ORAL_TABLET | Freq: Two times a day (BID) | ORAL | 1 refills | Status: DC
Start: 2018-02-21 — End: 2018-09-18

## 2018-02-21 MED ORDER — SITAGLIPTIN PHOSPHATE 100 MG PO TABS: 100.0000 mg | ORAL_TABLET | Freq: Every day | ORAL | 1 refills | Status: DC

## 2018-02-21 MED ORDER — GLIPIZIDE 5 MG PO TABS: 5.0000 mg | ORAL_TABLET | Freq: Every day | ORAL | 1 refills | Status: DC

## 2018-02-21 MED ORDER — OMEPRAZOLE 40 MG PO CPDR: 40.0000 mg | DELAYED_RELEASE_CAPSULE | Freq: Every day | ORAL | 1 refills | Status: DC

## 2018-02-21 MED ORDER — METOPROLOL SUCCINATE ER 100 MG PO TB24: ORAL_TABLET | ORAL | 1 refills | Status: DC

## 2018-02-21 MED ORDER — METFORMIN HCL ER 750 MG PO TB24: 1500.0000 mg | ORAL_TABLET | Freq: Every day | ORAL | 1 refills | Status: DC

## 2018-02-21 MED ORDER — AMLODIPINE BESYLATE 10 MG PO TABS: 10.0000 mg | ORAL_TABLET | Freq: Every day | ORAL | 1 refills | Status: DC

## 2018-02-21 MED ORDER — ATORVASTATIN CALCIUM 40 MG PO TABS: 40.0000 mg | ORAL_TABLET | Freq: Every day | ORAL | 1 refills | Status: DC

## 2018-02-21 NOTE — Progress Notes (Signed)
Name: Jason Farrell   MRN: [id number]    DOB: [birth year]   Date:02/21/2018       Progress Note  Subjective  Chief Complaint  Chief Complaint  Patient presents with  . Medication Refill  . Diabetes    Checks a couple of times a week, Average- 140-170  . Hyperlipidemia  . Hypertension    Denies any symptoms  . Dysphagia    Difficult swallowing and would like to discuss with PCP    HPI    DMII: he was diagnosed many years ago, taking Januvia, Metformin and Glipizide, Previous hgbA1C 7.0% back in January 2019, last one 7.1% and today also 7.1% . He states he usually checks in am's and glucose around 140-170's, but this am was up to 198. He had a milk shake  He has obesity,he also had microalbuminuria today but normal GFR on last labs. He will avoid nsaid's. Already taking ace.   OSA: wearing CPAP since 1996, very compliant , wears it every night all night. He states snoring is under control with CPAP and denies morning headaches. He has nasal congestion when wearing CPAP, advised to try nasal saline.   Morbid obesity: based on obesity with multiple risk factors. Discussed importance of cutting calories in a healthy way, eating lean meat and vegetables. He is not very compliant with diet.   HTN: is usually under control, it was low today, but states no dizziness, chest pain or palpitation, and at home bp is around 130's   COPD with history of tobacco use: he denies daily cough, not sure if he has SOB with activity because joint problems does not allow him to be very active. No wheezing. Unchanged. Quit smoking in 1996, after MI  S/p MI and and history of stent placement: sees Dr. Rockey Situ , denies chest pain or palpitation.   OA: seen ortho in [place] about 15 years and told he needed knee surgery, went to see Ortho in [place] had steroid injection on left knee about 6 weeks ago and pain is better and is considering synvisc injection if pain returns. Dr. Harlow Mares also diagnosed him  with OA of right hip, advised to stop nsaid's and take Tylenol instead max of 3 g per day    Patient Active Problem List   Diagnosis Date Noted  . Senile purpura ([place]) 02/21/2018  . Type 2 diabetes mellitus with microalbuminuria, without long-term current use of insulin ([place]) 02/21/2018  . Controlled type 2 diabetes mellitus without complication, without long-term current use of insulin ([place]) 11/11/2017  . 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 Obstructive sleep apnea on CPAP 12/09/2015  . Acid reflux 05/06/2015  . Arthritis, degenerative 05/06/2015  . Atherosclerosis of native coronary artery of native heart with stable angina pectoris ([place]) 01/25/2015  . Diabetes mellitus type 2 in obese ([place]) 08/16/2014  . Hyperlipidemia 08/16/2014  . Essential hypertension 08/16/2014  . Morbid obesity ([place]) 08/16/2014  . Stopped smoking with greater than 30 pack year history 08/16/2014  . COLD (chronic obstructive lung disease) ([place]) 08/16/2014    Past Surgical History:  Procedure Laterality Date  . CARDIAC CATHETERIZATION  2006   stent placement  . COLONOSCOPY    . CORONARY ANGIOPLASTY WITH STENT PLACEMENT  2006   stent x 2   . CORONARY ANGIOPLASTY WITH STENT PLACEMENT  1996   stent x 3   . FOOT SURGERY Right   . HALLUX VALGUS CHEILECTOMY Right 02/25/2015   Procedure: HALLUX VALGUS CHEILECTOMY;  Surgeon: [name], DPM;  Location: [facility] ORS;  Service: Podiatry;  Laterality: Right;    Family History  Problem Relation Age of Onset  . Hyperlipidemia Father   . Hypertension Father   . Alcohol abuse Father   . Cancer Sister        lung  . Hypertension Sister   . Heart disease Sister   . Hyperlipidemia Sister   . COPD Sister     Social History   Socioeconomic History  . Marital status: Married    Spouse name: Manuela Schwartz  . Number of children: 3  . Years of education: some college  . Highest education level: 12th grade  Occupational History  . Occupation: Retired  Scientific laboratory technician  .  Financial resource strain: Not hard at all  . Food insecurity:    Worry: Never true    Inability: Never true  . Transportation needs:    Medical: No    Non-medical: No  Tobacco Use  . Smoking status: Former Smoker    Packs/day: 1.50    Years: 35.00    Pack years: 52.50    Types: Cigarettes    Start date: 07/17/1959    Last attempt to quit: 08/27/1994    Years since quitting: 23.5  . Smokeless tobacco: Never Used  . Tobacco comment: smoking cessation materials not required  Substance and Sexual Activity  . Alcohol use: Yes    Alcohol/week: 14.0 standard drinks    Types: 14 Glasses of wine per week  . Drug use: No  . Sexual activity: Not Currently  Lifestyle  . Physical activity:    Days per week: 3 days    Minutes per session: 30 min  . Stress: Not at all  Relationships  . Social connections:    Talks on phone: Patient refused    Gets together: Patient refused    Attends religious service: Patient refused    Active member of club or organization: Patient refused    Attends meetings of clubs or organizations: Patient refused    Relationship status: Married  . Intimate partner violence:    Fear of current or ex partner: No    Emotionally abused: No    Physically abused: No    Forced sexual activity: No  Other Topics Concern  . Not on file  Social History Narrative  . Not on file     Current Outpatient Medications:  .  amLODipine (NORVASC) 10 MG tablet, Take 1 tablet (10 mg total) by mouth daily., Disp: 90 tablet, Rfl: 1 .  aspirin 81 MG tablet, Take 81 mg by mouth daily., Disp: , Rfl:  .  atorvastatin (LIPITOR) 40 MG tablet, Take 1 tablet (40 mg total) by mouth daily., Disp: 90 tablet, Rfl: 1 .  Coenzyme Q10 (CO Q10) 200 MG CAPS, Take 1 capsule by mouth daily., Disp: , Rfl:  .  glipiZIDE (GLUCOTROL) 5 MG tablet, Take 1 tablet (5 mg total) by mouth daily., Disp: 90 tablet, Rfl: 1 .  glucose blood test strip, Use as instructed, Disp: 100 each, Rfl: 12 .   lisinopril-hydrochlorothiazide (ZESTORETIC) 20-12.5 MG tablet, Take 1 tablet by mouth 2 (two) times daily., Disp: 180 tablet, Rfl: 1 .  metFORMIN (GLUCOPHAGE-XR) 750 MG 24 hr tablet, Take 2 tablets (1,500 mg total) by mouth daily with breakfast., Disp: 180 tablet, Rfl: 1 .  metoprolol succinate (TOPROL-XL) 100 MG 24 hr tablet, Take with or immediately following a meal., Disp: 90 tablet, Rfl: 1 .  Multiple Vitamins-Minerals (CENTRUM SILVER PO), Take by mouth daily.,  Disp: , Rfl:  .  Omega-3 Fatty Acids (FISH OIL) 1200 MG CAPS, Take 1,200 mg by mouth daily., Disp: , Rfl:  .  omeprazole (PRILOSEC) 40 MG capsule, Take 1 capsule (40 mg total) by mouth daily., Disp: 90 capsule, Rfl: 1 .  sitaGLIPtin (JANUVIA) 100 MG tablet, Take 1 tablet (100 mg total) by mouth daily., Disp: 90 tablet, Rfl: 1 .  Vitamin D, Cholecalciferol, 1000 units TABS, Take 1,000 units of lipase by mouth daily. , Disp: , Rfl:   No Known Allergies   ROS  Constitutional: Negative for fever or weight change.  Respiratory: Negative for cough and shortness of breath.   Cardiovascular: Negative for chest pain or palpitations.  Gastrointestinal: Negative for abdominal pain, no bowel changes.  Musculoskeletal: Negative for gait problem or joint swelling.  Skin: Negative for rash.  Neurological: Negative for dizziness or headache.  No other specific complaints in a complete review of systems (except as listed in HPI above).  Objective  Vitals:   02/21/18 1100  BP: 116/64  Pulse: 81  Resp: 16  Temp: 97.8 F (36.6 C)  TempSrc: Oral  SpO2: 95%  Weight: 231 lb 8 oz (105 kg)  Height: 5\' 6"  (1.676 m)    Body mass index is 37.37 kg/m.  Physical Exam  Constitutional: Patient appears well-developed and well-nourished. Obese  No distress.  HEENT: head atraumatic, normocephalic, pupils equal and reactive to light, boggy turbinates  neck supple, throat within normal limits Cardiovascular: Normal rate, regular rhythm and normal  heart sounds.  No murmur heard. No BLE edema. Pulmonary/Chest: Effort normal and breath sounds normal. No respiratory distress. Abdominal: Soft.  There is no tenderness. Psychiatric: Patient has a normal mood and affect. behavior is normal. Judgment and thought content normal. Muscular Skeletal: no pain during palpation of lumbar spine, crepitus of both knees with extension, small effusion left knee  Skin: senile purpura both arms  Recent Results (from the past 2160 hour(s))  POCT HgB A1C     Status: Abnormal   Collection Time: 02/21/18 11:12 AM  Result Value Ref Range   Hemoglobin A1C 7.1 (A) 4.0 - 5.6 %   HbA1c POC (<> result, manual entry)     HbA1c, POC (prediabetic range)     HbA1c, POC (controlled diabetic range)    POCT UA - Microalbumin     Status: Abnormal   Collection Time: 02/21/18 11:13 AM  Result Value Ref Range   Microalbumin Ur, POC 50 mg/L   Creatinine, POC     Albumin/Creatinine Ratio, Urine, POC      Diabetic Foot Exam: Diabetic Foot Exam - Simple   Simple Foot Form Diabetic Foot exam was performed with the following findings:  Yes 02/21/2018 11:34 AM  Visual Inspection No deformities, no ulcerations, no other skin breakdown bilaterally:  Yes Sensation Testing Intact to touch and monofilament testing bilaterally:  Yes Pulse Check Posterior Tibialis and Dorsalis pulse intact bilaterally:  Yes Comments      PHQ2/9: Depression screen [facility] 2/9 02/21/2018 01/23/2018 07/31/2017 04/30/2017 12/03/2016  Decreased Interest 0 0 0 0 0  Down, Depressed, Hopeless 0 0 0 0 0  PHQ - 2 Score 0 0 0 0 0  Altered sleeping 0 0 - - -  Tired, decreased energy 0 0 - - -  Change in appetite 0 0 - - -  Feeling bad or failure about yourself  0 0 - - -  Trouble concentrating 0 0 - - -  Moving slowly or fidgety/restless  0 0 - - -  Suicidal thoughts 0 0 - - -  PHQ-9 Score 0 0 - - -  Difficult doing work/chores - Not difficult at all - - -     Fall Risk: Fall Risk  02/21/2018  01/23/2018 10/22/2017 07/31/2017 04/30/2017  Falls in the past year? No No No No No  Risk for fall due to : - Impaired vision;Impaired balance/gait - - -  Risk for fall due to: Comment - wears eyeglasses; joint pain - - -     Functional Status Survey: Is the patient deaf or have difficulty hearing?: Yes Does the patient have difficulty seeing, even when wearing glasses/contacts?: Yes Does the patient have difficulty concentrating, remembering, or making decisions?: No Does the patient have difficulty walking or climbing stairs?: No Does the patient have difficulty dressing or bathing?: No Does the patient have difficulty doing errands alone such as visiting a doctor's office or shopping?: No    Assessment & Plan   1. Diabetes mellitus type 2 in obese (HCC)  - POCT HgB A1C - POCT UA - Microalbumin - sitaGLIPtin (JANUVIA) 100 MG tablet; Take 1 tablet (100 mg total) by mouth daily.  Dispense: 90 tablet; Refill: 1 - metFORMIN (GLUCOPHAGE-XR) 750 MG 24 hr tablet; Take 2 tablets (1,500 mg total) by mouth daily with breakfast.  Dispense: 180 tablet; Refill: 1 - glipiZIDE (GLUCOTROL) 5 MG tablet; Take 1 tablet (5 mg total) by mouth daily.  Dispense: 90 tablet; Refill: 1  2. Senile purpura ([place])  Reassurance   3. Gastroesophageal reflux disease, esophagitis presence not specified  - omeprazole (PRILOSEC) 40 MG capsule; Take 1 capsule (40 mg total) by mouth daily.  Dispense: 90 capsule; Refill: 1  4. Dyslipidemia associated with type 2 diabetes mellitus (HCC)  - atorvastatin (LIPITOR) 40 MG tablet; Take 1 tablet (40 mg total) by mouth daily.  Dispense: 90 tablet; Refill: 1  5. Essential hypertension  - metoprolol succinate (TOPROL-XL) 100 MG 24 hr tablet; Take with or immediately following a meal.  Dispense: 90 tablet; Refill: 1 - amLODipine (NORVASC) 10 MG tablet; Take 1 tablet (10 mg total) by mouth daily.  Dispense: 90 tablet; Refill: 1 - lisinopril-hydrochlorothiazide (ZESTORETIC)  20-12.5 MG tablet; Take 1 tablet by mouth 2 (two) times daily.  Dispense: 180 tablet; Refill: 1  6. Morbid obesity ([place])  Discussed with the patient the risk posed by an increased BMI. Discussed importance of portion control, calorie counting and at least 150 minutes of physical activity weekly. Avoid sweet beverages and drink more water. Eat at least 6 servings of fruit and vegetables daily   7. Obstructive sleep apnea on CPAP  Try saline spray before bed   8. Chronic bronchitis, unspecified chronic bronchitis type ([place])  Doing well   9. History of myocardial infarct at age less than 54 years  - metoprolol succinate (TOPROL-XL) 100 MG 24 hr tablet; Take with or immediately following a meal.  Dispense: 90 tablet; Refill: 1  10. Primary osteoarthritis involving multiple joints   11. Type 2 diabetes mellitus with microalbuminuria, without long-term current use of insulin (HCC)

## 2018-04-08 DIAGNOSIS — Z23 Encounter for immunization: Secondary | ICD-10-CM | POA: Diagnosis not present

## 2018-05-20 DIAGNOSIS — M1712 Unilateral primary osteoarthritis, left knee: Secondary | ICD-10-CM | POA: Diagnosis not present

## 2018-05-29 DIAGNOSIS — M25662 Stiffness of left knee, not elsewhere classified: Secondary | ICD-10-CM | POA: Diagnosis not present

## 2018-05-29 DIAGNOSIS — M25562 Pain in left knee: Secondary | ICD-10-CM | POA: Diagnosis not present

## 2018-06-10 DIAGNOSIS — M25562 Pain in left knee: Secondary | ICD-10-CM | POA: Diagnosis not present

## 2018-06-10 DIAGNOSIS — M25662 Stiffness of left knee, not elsewhere classified: Secondary | ICD-10-CM | POA: Diagnosis not present

## 2018-06-16 DIAGNOSIS — M25662 Stiffness of left knee, not elsewhere classified: Secondary | ICD-10-CM | POA: Diagnosis not present

## 2018-06-16 DIAGNOSIS — M25562 Pain in left knee: Secondary | ICD-10-CM | POA: Diagnosis not present

## 2018-06-19 DIAGNOSIS — M25562 Pain in left knee: Secondary | ICD-10-CM | POA: Diagnosis not present

## 2018-06-19 DIAGNOSIS — M25662 Stiffness of left knee, not elsewhere classified: Secondary | ICD-10-CM | POA: Diagnosis not present

## 2018-06-23 ENCOUNTER — Ambulatory Visit: Payer: Medicare Other | Admitting: Family Medicine

## 2018-06-23 DIAGNOSIS — M25662 Stiffness of left knee, not elsewhere classified: Secondary | ICD-10-CM | POA: Diagnosis not present

## 2018-06-23 DIAGNOSIS — M25562 Pain in left knee: Secondary | ICD-10-CM | POA: Diagnosis not present

## 2018-06-26 DIAGNOSIS — M25562 Pain in left knee: Secondary | ICD-10-CM | POA: Diagnosis not present

## 2018-06-26 DIAGNOSIS — M25662 Stiffness of left knee, not elsewhere classified: Secondary | ICD-10-CM | POA: Diagnosis not present

## 2018-06-30 DIAGNOSIS — M25562 Pain in left knee: Secondary | ICD-10-CM | POA: Diagnosis not present

## 2018-06-30 DIAGNOSIS — M25662 Stiffness of left knee, not elsewhere classified: Secondary | ICD-10-CM | POA: Diagnosis not present

## 2018-07-03 DIAGNOSIS — M25662 Stiffness of left knee, not elsewhere classified: Secondary | ICD-10-CM | POA: Diagnosis not present

## 2018-07-03 DIAGNOSIS — M25562 Pain in left knee: Secondary | ICD-10-CM | POA: Diagnosis not present

## 2018-07-04 ENCOUNTER — Encounter: Payer: Self-pay | Admitting: Family Medicine

## 2018-07-04 ENCOUNTER — Ambulatory Visit (INDEPENDENT_AMBULATORY_CARE_PROVIDER_SITE_OTHER): Payer: Medicare Other | Admitting: Family Medicine

## 2018-07-04 VITALS — BP 110/58 | HR 67 | Temp 97.6°F | Resp 16 | Ht 66.0 in | Wt 224.4 lb

## 2018-07-04 DIAGNOSIS — I252 Old myocardial infarction: Secondary | ICD-10-CM

## 2018-07-04 DIAGNOSIS — G4733 Obstructive sleep apnea (adult) (pediatric): Secondary | ICD-10-CM | POA: Diagnosis not present

## 2018-07-04 DIAGNOSIS — Z9989 Dependence on other enabling machines and devices: Secondary | ICD-10-CM

## 2018-07-04 DIAGNOSIS — E785 Hyperlipidemia, unspecified: Secondary | ICD-10-CM

## 2018-07-04 DIAGNOSIS — M150 Primary generalized (osteo)arthritis: Secondary | ICD-10-CM

## 2018-07-04 DIAGNOSIS — E1129 Type 2 diabetes mellitus with other diabetic kidney complication: Secondary | ICD-10-CM | POA: Diagnosis not present

## 2018-07-04 DIAGNOSIS — I10 Essential (primary) hypertension: Secondary | ICD-10-CM | POA: Diagnosis not present

## 2018-07-04 DIAGNOSIS — I25118 Atherosclerotic heart disease of native coronary artery with other forms of angina pectoris: Secondary | ICD-10-CM

## 2018-07-04 DIAGNOSIS — E669 Obesity, unspecified: Secondary | ICD-10-CM

## 2018-07-04 DIAGNOSIS — R809 Proteinuria, unspecified: Secondary | ICD-10-CM | POA: Diagnosis not present

## 2018-07-04 DIAGNOSIS — E1169 Type 2 diabetes mellitus with other specified complication: Secondary | ICD-10-CM

## 2018-07-04 DIAGNOSIS — E119 Type 2 diabetes mellitus without complications: Secondary | ICD-10-CM

## 2018-07-04 DIAGNOSIS — J42 Unspecified chronic bronchitis: Secondary | ICD-10-CM | POA: Diagnosis not present

## 2018-07-04 DIAGNOSIS — M159 Polyosteoarthritis, unspecified: Secondary | ICD-10-CM

## 2018-07-04 LAB — POCT GLYCOSYLATED HEMOGLOBIN (HGB A1C): Hemoglobin A1C: 8.7 % — AB (ref 4.0–5.6)

## 2018-07-04 MED ORDER — SITAGLIPTIN PHOSPHATE 100 MG PO TABS: 100.0000 mg | ORAL_TABLET | Freq: Every day | ORAL | 1 refills | Status: DC

## 2018-07-04 MED ORDER — METOPROLOL SUCCINATE ER 50 MG PO TB24: 50.0000 mg | ORAL_TABLET | Freq: Every day | ORAL | 0 refills | Status: DC

## 2018-07-04 MED ORDER — GLIPIZIDE 5 MG PO TABS: 5.0000 mg | ORAL_TABLET | Freq: Two times a day (BID) | ORAL | 0 refills | Status: DC

## 2018-07-04 NOTE — Progress Notes (Signed)
Name: Jason Farrell   MRN: [id number]    DOB: 09-02-1940   Date:07/04/2018       Progress Note  Subjective  Chief Complaint  Chief Complaint  Patient presents with  . Follow-up  . Diabetes    patient stated that he checks it 2/week and it keeps going up. lately in the 300s  . Gastroesophageal Reflux  . Sleep Apnea  . Obesity  . Chronic Bronchitis  . Osteoarthritis  . Senile Purpura  . Medication Refill  . Altered Mental Status    patient stated that lately he has been having some episodes of confusion.     HPI  DMII: he was diagnosed many years ago, taking Januvia, Metformin and Glipizide, he had some gaps on medications because of mail order , but usually takes it daily. Over a year ago used to take glipizide twice daily but glucose improved and it was changed to once daily, hgbA1C today is high at 8.7% previous levels were  hgbA1C 7.0% back in January 2019, last one 7.1% and again at 7.1% . He states he usually checks in am's and glucose 250-300 for the past month. He states not as compliant with diet since Thanksgiving, he is not drinking enough water, drinks unsweet tea. He has microalbuminuria and is on ACE  OSA: wearing CPAP since 1996, very compliant , wears it every night all night. He states snoring is under control with CPAP . He has nasal congestion when wearing CPAP. He states sometimes does not sleep well because has to re-adjust CPAP   Morbid obesity: based on obesity with multiple risk factors. He is not eating healthy lately, lost weight, but likely because DM is out of control   HTN: bp is towards low end of normal now and is also low at home, we will decrease dose of metoprolol to 50 mg daily and may need to decrease dose of norvasc on his next visit. He denies dizziness, chest pain or palpitation  COPD with history of tobacco use: he denies daily cough, not sure if he has SOB with activity because joint problems does not allow him to be very active. No wheezing.  Unchanged. Quit smoking in 1996, after MI. Unchanged   S/p MI and and history of stent placement: sees Dr. Rockey Situ , denies chest pain or palpitation. We will adjust dose of metoprolol and monitor heart rate   OA: seen ortho in [place] about 15 years and told he needed knee surgery. Dr. Harlow Mares also diagnosed him with OA of right hip, taking Tylenol now and seems to be controlling symptoms. He is having PT for knee now   Patient Active Problem List   Diagnosis Date Noted  . Senile purpura ([place]) 02/21/2018  . Type 2 diabetes mellitus with microalbuminuria, without long-term current use of insulin ([place]) 02/21/2018  . Controlled type 2 diabetes mellitus without complication, without long-term current use of insulin ([place]) 11/11/2017  . 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 Obstructive sleep apnea on CPAP 12/09/2015  . Acid reflux 05/06/2015  . Arthritis, degenerative 05/06/2015  . Atherosclerosis of native coronary artery of native heart with stable angina pectoris ([place]) 01/25/2015  . Diabetes mellitus type 2 in obese ([place]) 08/16/2014  . Hyperlipidemia 08/16/2014  . Essential hypertension 08/16/2014  . Morbid obesity ([place]) 08/16/2014  . Stopped smoking with greater than 30 pack year history 08/16/2014  . COLD (chronic obstructive lung disease) ([place]) 08/16/2014    Past Surgical History:  Procedure Laterality Date  . CARDIAC  CATHETERIZATION  2006   stent placement  . COLONOSCOPY    . CORONARY ANGIOPLASTY WITH STENT PLACEMENT  2006   stent x 2   . CORONARY ANGIOPLASTY WITH STENT PLACEMENT  1996   stent x 3   . FOOT SURGERY Right   . HALLUX VALGUS CHEILECTOMY Right 02/25/2015   Procedure: HALLUX VALGUS CHEILECTOMY;  Surgeon: [name], DPM;  Location: [facility] ORS;  Service: Podiatry;  Laterality: Right;    Family History  Problem Relation Age of Onset  . Hyperlipidemia Father   . Hypertension Father   . Alcohol abuse Father   . Cancer Sister        lung  . Hypertension Sister    . Heart disease Sister   . Hyperlipidemia Sister   . COPD Sister     Social History   Socioeconomic History  . Marital status: Married    Spouse name: Manuela Schwartz  . Number of children: 3  . Years of education: some college  . Highest education level: 12th grade  Occupational History  . Occupation: Retired  Scientific laboratory technician  . Financial resource strain: Not hard at all  . Food insecurity:    Worry: Never true    Inability: Never true  . Transportation needs:    Medical: No    Non-medical: No  Tobacco Use  . Smoking status: Former Smoker    Packs/day: 1.50    Years: 35.00    Pack years: 52.50    Types: Cigarettes    Start date: 07/17/1959    Last attempt to quit: 08/27/1994    Years since quitting: 23.8  . Smokeless tobacco: Never Used  . Tobacco comment: smoking cessation materials not required  Substance and Sexual Activity  . Alcohol use: Yes    Alcohol/week: 14.0 standard drinks    Types: 14 Glasses of wine per week  . Drug use: No  . Sexual activity: Not Currently    Partners: Female  Lifestyle  . Physical activity:    Days per week: 3 days    Minutes per session: 30 min  . Stress: Not at all  Relationships  . Social connections:    Talks on phone: Once a week    Gets together: Never    Attends religious service: Never    Active member of club or organization: No    Attends meetings of clubs or organizations: Never    Relationship status: Married  . Intimate partner violence:    Fear of current or ex partner: No    Emotionally abused: No    Physically abused: No    Forced sexual activity: No  Other Topics Concern  . Not on file  Social History Narrative  . Not on file     Current Outpatient Medications:  .  amLODipine (NORVASC) 10 MG tablet, Take 1 tablet (10 mg total) by mouth daily., Disp: 90 tablet, Rfl: 1 .  aspirin 81 MG tablet, Take 81 mg by mouth daily., Disp: , Rfl:  .  atorvastatin (LIPITOR) 40 MG tablet, Take 1 tablet (40 mg total) by mouth  daily., Disp: 90 tablet, Rfl: 1 .  Coenzyme Q10 (CO Q10) 200 MG CAPS, Take 1 capsule by mouth daily., Disp: , Rfl:  .  glipiZIDE (GLUCOTROL) 5 MG tablet, Take 1 tablet (5 mg total) by mouth 2 (two) times daily before a meal., Disp: 180 tablet, Rfl: 0 .  glucose blood test strip, Use as instructed, Disp: 100 each, Rfl: 12 .  lisinopril-hydrochlorothiazide (ZESTORETIC)  20-12.5 MG tablet, Take 1 tablet by mouth 2 (two) times daily., Disp: 180 tablet, Rfl: 1 .  metFORMIN (GLUCOPHAGE-XR) 750 MG 24 hr tablet, Take 2 tablets (1,500 mg total) by mouth daily with breakfast., Disp: 180 tablet, Rfl: 1 .  metoprolol succinate (TOPROL-XL) 50 MG 24 hr tablet, Take 1 tablet (50 mg total) by mouth daily. Take with or immediately following a meal., Disp: 90 tablet, Rfl: 0 .  Multiple Vitamins-Minerals (CENTRUM SILVER PO), Take by mouth daily., Disp: , Rfl:  .  Omega-3 Fatty Acids (FISH OIL) 1200 MG CAPS, Take 1,200 mg by mouth daily., Disp: , Rfl:  .  omeprazole (PRILOSEC) 40 MG capsule, Take 1 capsule (40 mg total) by mouth daily., Disp: 90 capsule, Rfl: 1 .  sitaGLIPtin (JANUVIA) 100 MG tablet, Take 1 tablet (100 mg total) by mouth daily., Disp: 90 tablet, Rfl: 1 .  Vitamin D, Cholecalciferol, 1000 units TABS, Take 1,000 units of lipase by mouth daily. , Disp: , Rfl:   No Known Allergies  I personally reviewed active problem list, medication list, allergies, family history, social history with the patient/caregiver today.   ROS  Constitutional: Negative for fever, positive for  weight change.  Respiratory: Negative for cough and shortness of breath.   Cardiovascular: Negative for chest pain or palpitations.  Gastrointestinal: Negative for abdominal pain, no bowel changes.  Musculoskeletal: Negative for gait problem or joint swelling.  Skin: Negative for rash.  Neurological: Negative for dizziness, positive for intermittent  headache.  No other specific complaints in a complete review of systems (except  as listed in HPI above).   Objective  Vitals:   07/04/18 1030  BP: (!) 110/58  Pulse: 67  Resp: 16  Temp: 97.6 F (36.4 C)  TempSrc: Oral  SpO2: 97%  Weight: 224 lb 6.4 oz (101.8 kg)  Height: 5\' 6"  (1.676 m)    Body mass index is 36.22 kg/m.  Physical Exam  Constitutional: Patient appears well-developed and well-nourished. Obese  No distress.  HEENT: head atraumatic, normocephalic, pupils equal and reactive to light,  neck supple, throat within normal limits Cardiovascular: Normal rate, regular rhythm and normal heart sounds.  No murmur heard. No BLE edema. Pulmonary/Chest: Effort normal and breath sounds normal. No respiratory distress. Abdominal: Soft.  There is no tenderness. Psychiatric: Patient has a normal mood and affect. behavior is normal. Judgment and thought content normal.  Recent Results (from the past 2160 hour(s))  POCT glycosylated hemoglobin (Hb A1C)     Status: Abnormal   Collection Time: 07/04/18 10:41 AM  Result Value Ref Range   Hemoglobin A1C 8.7 (A) 4.0 - 5.6 %   HbA1c POC (<> result, manual entry)     HbA1c, POC (prediabetic range)     HbA1c, POC (controlled diabetic range)        PHQ2/9: Depression screen [facility] 2/9 07/04/2018 02/21/2018 01/23/2018 07/31/2017 04/30/2017  Decreased Interest 0 0 0 0 0  Down, Depressed, Hopeless 0 0 0 0 0  PHQ - 2 Score 0 0 0 0 0  Altered sleeping 1 0 0 - -  Tired, decreased energy 3 0 0 - -  Change in appetite 1 0 0 - -  Feeling bad or failure about yourself  0 0 0 - -  Trouble concentrating 0 0 0 - -  Moving slowly or fidgety/restless 0 0 0 - -  Suicidal thoughts 0 0 0 - -  PHQ-9 Score 5 0 0 - -  Difficult doing work/chores Not difficult  at all - Not difficult at all - -    Fall Risk: Fall Risk  07/04/2018 02/21/2018 01/23/2018 10/22/2017 07/31/2017  Falls in the past year? 0 No No No No  Number falls in past yr: 0 - - - -  Injury with Fall? 0 - - - -  Risk for fall due to : - - Impaired vision;Impaired  balance/gait - -  Risk for fall due to: Comment - - wears eyeglasses; joint pain - -     Functional Status Survey: Is the patient deaf or have difficulty hearing?: Yes Does the patient have difficulty seeing, even when wearing glasses/contacts?: No Does the patient have difficulty concentrating, remembering, or making decisions?: Yes Does the patient have difficulty walking or climbing stairs?: Yes Does the patient have difficulty dressing or bathing?: No Does the patient have difficulty doing errands alone such as visiting a doctor's office or shopping?: No    Assessment & Plan  1. Diabetes mellitus type 2 in obese (HCC)  HgbA1C is high, we will increase glipizide to BID, he will resume a diabetic diet and return in about one month for follow up - POCT glycosylated hemoglobin (Hb A1C) - glipiZIDE (GLUCOTROL) 5 MG tablet; Take 1 tablet (5 mg total) by mouth 2 (two) times daily before a meal.  Dispense: 180 tablet; Refill: 0 - sitaGLIPtin (JANUVIA) 100 MG tablet; Take 1 tablet (100 mg total) by mouth daily.  Dispense: 90 tablet; Refill: 1  2. Dyslipidemia associated with type 2 diabetes mellitus ([place])  Continue statin and recheck next visit   3. Essential hypertension  - metoprolol succinate (TOPROL-XL) 50 MG 24 hr tablet; Take 1 tablet (50 mg total) by mouth daily. Take with or immediately following a meal.  Dispense: 90 tablet; Refill: 0  4. History of myocardial infarct at age less than 30 years  - metoprolol succinate (TOPROL-XL) 50 MG 24 hr tablet; Take 1 tablet (50 mg total) by mouth daily. Take with or immediately following a meal.  Dispense: 90 tablet; Refill: 0  5. Type 2 diabetes mellitus with microalbuminuria, without long-term current use of insulin (HCC)  - glipiZIDE (GLUCOTROL) 5 MG tablet; Take 1 tablet (5 mg total) by mouth 2 (two) times daily before a meal.  Dispense: 180 tablet; Refill: 0 - sitaGLIPtin (JANUVIA) 100 MG tablet; Take 1 tablet (100 mg total) by  mouth daily.  Dispense: 90 tablet; Refill: 1  6. Obstructive sleep apnea on CPAP  Continue CPAP   7. Morbid obesity ([place])   8. Primary osteoarthritis involving multiple joints  Continue PT   9. Chronic bronchitis, unspecified chronic bronchitis type ([place])  Stable

## 2018-07-04 NOTE — Patient Instructions (Signed)
Only medication you cannot take when fasting is Glipizide

## 2018-07-07 DIAGNOSIS — M25562 Pain in left knee: Secondary | ICD-10-CM | POA: Diagnosis not present

## 2018-07-07 DIAGNOSIS — M25662 Stiffness of left knee, not elsewhere classified: Secondary | ICD-10-CM | POA: Diagnosis not present

## 2018-07-10 ENCOUNTER — Telehealth: Payer: Self-pay | Admitting: Family Medicine

## 2018-07-10 DIAGNOSIS — M25562 Pain in left knee: Secondary | ICD-10-CM | POA: Diagnosis not present

## 2018-07-10 DIAGNOSIS — M25662 Stiffness of left knee, not elsewhere classified: Secondary | ICD-10-CM | POA: Diagnosis not present

## 2018-07-10 NOTE — Telephone Encounter (Addendum)
Refill request for diabetic medication:   Januvia 100 mg  Last office visit pertaining to diabetes: 07/04/18    Lab Results  Component Value Date   HGBA1C 8.7 (A) 07/04/2018   Follow-ups on file. 08/04/2018

## 2018-07-10 NOTE — Telephone Encounter (Signed)
Please call in one week, don't change on EHR otherwise it will delete the 90 day supply I just sent. Thank you

## 2018-07-10 NOTE — Telephone Encounter (Signed)
Called patient to inform him that 1 week of Januvia was called in to [facility]. No answer I left vm.

## 2018-07-10 NOTE — Telephone Encounter (Signed)
Pt states he has been out of [place] for about a week now. I did inform his that Dr Ancil Boozer sent in his refills to his mail order pharmacy. He has not received them and is asking that you send in a limited supply (maybe 1week) to [facility]. He is needing enough to last until the other one comes in.

## 2018-07-18 ENCOUNTER — Telehealth: Payer: Self-pay | Admitting: Family Medicine

## 2018-07-18 NOTE — Telephone Encounter (Signed)
Copied from [place] [id number]. Topic: Quick Communication - See Telephone Encounter >> Jul 18, 2018  3:38 PM Vernona Rieger wrote: CRM for notification. See Telephone encounter for: 07/18/18.  Pt states that he needs a new CPAP machine, the one he has is about to crash. He said that he will call back with where he wants it to go.

## 2018-07-18 NOTE — Telephone Encounter (Signed)
Pt wanting cpap to be ordered thru [facility]. This is where pt is getting cpap supplies currently.  [address] [place] [address] Email: [email or web address] Phone: [phone] Fax: [phone]

## 2018-07-21 ENCOUNTER — Other Ambulatory Visit: Payer: Self-pay

## 2018-07-21 ENCOUNTER — Telehealth: Payer: Self-pay | Admitting: Family Medicine

## 2018-07-21 ENCOUNTER — Encounter: Payer: Self-pay | Admitting: Emergency Medicine

## 2018-07-21 ENCOUNTER — Encounter: Payer: Self-pay | Admitting: Nurse Practitioner

## 2018-07-21 ENCOUNTER — Emergency Department: Payer: Medicare Other

## 2018-07-21 ENCOUNTER — Emergency Department
Admission: EM | Admit: 2018-07-21 | Discharge: 2018-07-21 | Disposition: A | Discharge status: Home / Self Care | Payer: Medicare Other | Attending: Emergency Medicine | Admitting: Emergency Medicine

## 2018-07-21 ENCOUNTER — Ambulatory Visit (INDEPENDENT_AMBULATORY_CARE_PROVIDER_SITE_OTHER): Payer: Medicare Other | Admitting: Nurse Practitioner

## 2018-07-21 VITALS — BP 120/62 | HR 73 | Temp 97.8°F | Resp 16 | Ht 66.0 in | Wt 221.7 lb

## 2018-07-21 DIAGNOSIS — G919 Hydrocephalus, unspecified: Secondary | ICD-10-CM | POA: Insufficient documentation

## 2018-07-21 DIAGNOSIS — Z955 Presence of coronary angioplasty implant and graft: Secondary | ICD-10-CM | POA: Diagnosis not present

## 2018-07-21 DIAGNOSIS — Z87891 Personal history of nicotine dependence: Secondary | ICD-10-CM | POA: Diagnosis not present

## 2018-07-21 DIAGNOSIS — R410 Disorientation, unspecified: Secondary | ICD-10-CM | POA: Diagnosis not present

## 2018-07-21 DIAGNOSIS — Z7984 Long term (current) use of oral hypoglycemic drugs: Secondary | ICD-10-CM | POA: Diagnosis not present

## 2018-07-21 DIAGNOSIS — Z7982 Long term (current) use of aspirin: Secondary | ICD-10-CM | POA: Diagnosis not present

## 2018-07-21 DIAGNOSIS — R4781 Slurred speech: Secondary | ICD-10-CM | POA: Diagnosis not present

## 2018-07-21 DIAGNOSIS — H538 Other visual disturbances: Secondary | ICD-10-CM

## 2018-07-21 DIAGNOSIS — R32 Unspecified urinary incontinence: Secondary | ICD-10-CM

## 2018-07-21 DIAGNOSIS — Z79899 Other long term (current) drug therapy: Secondary | ICD-10-CM | POA: Diagnosis not present

## 2018-07-21 DIAGNOSIS — D329 Benign neoplasm of meninges, unspecified: Secondary | ICD-10-CM | POA: Diagnosis not present

## 2018-07-21 DIAGNOSIS — D320 Benign neoplasm of cerebral meninges: Secondary | ICD-10-CM | POA: Insufficient documentation

## 2018-07-21 DIAGNOSIS — G9389 Other specified disorders of brain: Secondary | ICD-10-CM | POA: Diagnosis not present

## 2018-07-21 DIAGNOSIS — I10 Essential (primary) hypertension: Secondary | ICD-10-CM | POA: Diagnosis not present

## 2018-07-21 DIAGNOSIS — E119 Type 2 diabetes mellitus without complications: Secondary | ICD-10-CM | POA: Insufficient documentation

## 2018-07-21 DIAGNOSIS — I4510 Unspecified right bundle-branch block: Secondary | ICD-10-CM | POA: Diagnosis not present

## 2018-07-21 DIAGNOSIS — I2510 Atherosclerotic heart disease of native coronary artery without angina pectoris: Secondary | ICD-10-CM | POA: Diagnosis not present

## 2018-07-21 DIAGNOSIS — I252 Old myocardial infarction: Secondary | ICD-10-CM | POA: Insufficient documentation

## 2018-07-21 DIAGNOSIS — R279 Unspecified lack of coordination: Secondary | ICD-10-CM | POA: Diagnosis not present

## 2018-07-21 DIAGNOSIS — R4182 Altered mental status, unspecified: Secondary | ICD-10-CM | POA: Diagnosis not present

## 2018-07-21 LAB — CBC WITH DIFFERENTIAL/PLATELET
Abs Immature Granulocytes: 0.06 10*3/uL (ref 0.00–0.07)
Basophils Absolute: 0.1 10*3/uL (ref 0.0–0.1)
Basophils Relative: 1 %
EOS ABS: 0.1 10*3/uL (ref 0.0–0.5)
EOS PCT: 1 %
HCT: 44.7 % (ref 39.0–52.0)
Hemoglobin: 14.7 g/dL (ref 13.0–17.0)
Immature Granulocytes: 1 %
Lymphocytes Relative: 14 %
Lymphs Abs: 1.5 10*3/uL (ref 0.7–4.0)
MCH: 29.5 pg (ref 26.0–34.0)
MCHC: 32.9 g/dL (ref 30.0–36.0)
MCV: 89.6 fL (ref 80.0–100.0)
Monocytes Absolute: 0.8 10*3/uL (ref 0.1–1.0)
Monocytes Relative: 8 %
Neutro Abs: 8.2 10*3/uL — ABNORMAL HIGH (ref 1.7–7.7)
Neutrophils Relative %: 75 %
PLATELETS: 340 10*3/uL (ref 150–400)
RBC: 4.99 MIL/uL (ref 4.22–5.81)
RDW: 14.8 % (ref 11.5–15.5)
WBC: 10.9 10*3/uL — ABNORMAL HIGH (ref 4.0–10.5)
nRBC: 0 % (ref 0.0–0.2)

## 2018-07-21 LAB — POCT URINALYSIS DIPSTICK
Appearance: NORMAL
BILIRUBIN UA: NEGATIVE
GLUCOSE UA: NEGATIVE
KETONES UA: NEGATIVE
Leukocytes, UA: NEGATIVE
Nitrite, UA: NEGATIVE
Protein, UA: NEGATIVE
RBC UA: NEGATIVE
SPEC GRAV UA: 1.01 (ref 1.010–1.025)
Urobilinogen, UA: 0.2 E.U./dL
pH, UA: 6 (ref 5.0–8.0)

## 2018-07-21 LAB — URINALYSIS, COMPLETE (UACMP) WITH MICROSCOPIC
Bacteria, UA: NONE SEEN
Bilirubin Urine: NEGATIVE
Glucose, UA: NEGATIVE mg/dL
HGB URINE DIPSTICK: NEGATIVE
Ketones, ur: NEGATIVE mg/dL
Leukocytes, UA: NEGATIVE
Nitrite: NEGATIVE
Protein, ur: NEGATIVE mg/dL
Specific Gravity, Urine: 1.019 (ref 1.005–1.030)
Squamous Epithelial / HPF: NONE SEEN (ref 0–5)
pH: 5 (ref 5.0–8.0)

## 2018-07-21 LAB — POCT GLUCOSE (DEVICE FOR HOME USE): POC Glucose: 144 mg/dl — AB (ref 70–99)

## 2018-07-21 LAB — COMPREHENSIVE METABOLIC PANEL
ALT: 16 U/L (ref 0–44)
AST: 14 U/L — ABNORMAL LOW (ref 15–41)
Albumin: 4 g/dL (ref 3.5–5.0)
Alkaline Phosphatase: 71 U/L (ref 38–126)
Anion gap: 11 (ref 5–15)
BUN: 14 mg/dL (ref 8–23)
CHLORIDE: 102 mmol/L (ref 98–111)
CO2: 27 mmol/L (ref 22–32)
Calcium: 9.5 mg/dL (ref 8.9–10.3)
Creatinine, Ser: 0.97 mg/dL (ref 0.61–1.24)
GFR calc non Af Amer: >60 mL/min (ref >60)
Glucose, Bld: 141 mg/dL — ABNORMAL HIGH (ref 70–99)
Potassium: 3.9 mmol/L (ref 3.5–5.1)
Sodium: 140 mmol/L (ref 135–145)
Total Bilirubin: 0.9 mg/dL (ref 0.3–1.2)
Total Protein: 7.4 g/dL (ref 6.5–8.1)

## 2018-07-21 MED ORDER — GADOBUTROL 1 MMOL/ML IV SOLN
10.0000 mL | Freq: Once | INTRAVENOUS | Status: AC | PRN
  Administered 2018-07-21: 10 mL via INTRAVENOUS

## 2018-07-21 NOTE — Telephone Encounter (Signed)
Pt stopped by the office to ask if Dr Ancil Boozer has received his order for his C Pap machine.

## 2018-07-21 NOTE — Patient Instructions (Addendum)
  care provider. Make sure you discuss any questions you have with your health care provider.  Document Released: 10/19/2016 Document Revised: 10/19/2016 Document Reviewed: 10/19/2016  Elsevier Interactive Patient Education © 2019 Elsevier Inc.

## 2018-07-21 NOTE — Progress Notes (Signed)
Called for pt. repeatedly in the waiting room and received no response

## 2018-07-21 NOTE — Telephone Encounter (Signed)
Faxed CPAP order into [facility] today at 2:50 p.m. TJ

## 2018-07-21 NOTE — ED Notes (Signed)
First nurse note: Per EMS, pt was at Dr office who was concerned for poss TIA vs Stroke with symptom complaints of waking up from a nap this am with some blurred vision, now resolved, confusion over the past few weeks, now resolved and pt states he had slurred speech on Friday night. Pt started taking Metroprolol on Friday per Dr order, also c/o increased urination. CBG 144. EMS reports negative stroke scale. Pt in NAD.

## 2018-07-21 NOTE — ED Triage Notes (Signed)
Pt reports has had altered mental status for last week.  He gives example of wanting to turn right and knows he wants to go that way but would not turn that way when driving car.  Alert and oriented at this time.  NAD.  Denies difficulty walking or weakness.  No facial droop. Reports slurred speech at times; clear in triage.  Doctor sent to ED for eval.

## 2018-07-21 NOTE — Progress Notes (Deleted)
Name: Jason Farrell   MRN: [id number]    DOB: 1940/10/18   Date:07/21/2018       Progress Note  Subjective  Chief Complaint  No chief complaint on file.   HPI  Patient was recently started on *** for diabetes on 07/04/2018. States since taking this new medication has been feeling dizzy and disoriented. Had an episode of incontinence.   Patient Active Problem List   Diagnosis Date Noted  . Senile purpura ([place]) 02/21/2018  . Type 2 diabetes mellitus with microalbuminuria, without long-term current use of insulin ([place]) 02/21/2018  . Controlled type 2 diabetes mellitus without complication, without long-term current use of insulin ([place]) 11/11/2017  . 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 Obstructive sleep apnea on CPAP 12/09/2015  . Acid reflux 05/06/2015  . Arthritis, degenerative 05/06/2015  . Atherosclerosis of native coronary artery of native heart with stable angina pectoris ([place]) 01/25/2015  . Diabetes mellitus type 2 in obese ([place]) 08/16/2014  . Hyperlipidemia 08/16/2014  . Essential hypertension 08/16/2014  . Morbid obesity ([place]) 08/16/2014  . Stopped smoking with greater than 30 pack year history 08/16/2014  . COLD (chronic obstructive lung disease) ([place]) 08/16/2014    Past Medical History:  Diagnosis Date  . Coronary artery disease   . Diabetes mellitus without complication ([place])   . Dyslipidemia   . GERD (gastroesophageal reflux disease)   . Hyperlipidemia   . Hypertension   . MI (myocardial infarction) ([place])   . Osteoarthritis     Past Surgical History:  Procedure Laterality Date  . CARDIAC CATHETERIZATION  2006   stent placement  . COLONOSCOPY    . CORONARY ANGIOPLASTY WITH STENT PLACEMENT  2006   stent x 2   . CORONARY ANGIOPLASTY WITH STENT PLACEMENT  1996   stent x 3   . FOOT SURGERY Right   . HALLUX VALGUS CHEILECTOMY Right 02/25/2015   Procedure: HALLUX VALGUS CHEILECTOMY;  Surgeon: [name], DPM;  Location: [facility] ORS;  Service: Podiatry;   Laterality: Right;    Social History   Tobacco Use  . Smoking status: Former Smoker    Packs/day: 1.50    Years: 35.00    Pack years: 52.50    Types: Cigarettes    Start date: 07/17/1959    Last attempt to quit: 08/27/1994    Years since quitting: 23.9  . Smokeless tobacco: Never Used  . Tobacco comment: smoking cessation materials not required  Substance Use Topics  . Alcohol use: Yes    Alcohol/week: 14.0 standard drinks    Types: 14 Glasses of wine per week     Current Outpatient Medications:  .  amLODipine (NORVASC) 10 MG tablet, Take 1 tablet (10 mg total) by mouth daily., Disp: 90 tablet, Rfl: 1 .  aspirin 81 MG tablet, Take 81 mg by mouth daily., Disp: , Rfl:  .  atorvastatin (LIPITOR) 40 MG tablet, Take 1 tablet (40 mg total) by mouth daily., Disp: 90 tablet, Rfl: 1 .  Coenzyme Q10 (CO Q10) 200 MG CAPS, Take 1 capsule by mouth daily., Disp: , Rfl:  .  glipiZIDE (GLUCOTROL) 5 MG tablet, Take 1 tablet (5 mg total) by mouth 2 (two) times daily before a meal., Disp: 180 tablet, Rfl: 0 .  glucose blood test strip, Use as instructed, Disp: 100 each, Rfl: 12 .  lisinopril-hydrochlorothiazide (ZESTORETIC) 20-12.5 MG tablet, Take 1 tablet by mouth 2 (two) times daily., Disp: 180 tablet, Rfl: 1 .  metFORMIN (GLUCOPHAGE-XR) 750 MG 24 hr tablet, Take 2  tablets (1,500 mg total) by mouth daily with breakfast., Disp: 180 tablet, Rfl: 1 .  metoprolol succinate (TOPROL-XL) 50 MG 24 hr tablet, Take 1 tablet (50 mg total) by mouth daily. Take with or immediately following a meal., Disp: 90 tablet, Rfl: 0 .  Multiple Vitamins-Minerals (CENTRUM SILVER PO), Take by mouth daily., Disp: , Rfl:  .  Omega-3 Fatty Acids (FISH OIL) 1200 MG CAPS, Take 1,200 mg by mouth daily., Disp: , Rfl:  .  omeprazole (PRILOSEC) 40 MG capsule, Take 1 capsule (40 mg total) by mouth daily., Disp: 90 capsule, Rfl: 1 .  sitaGLIPtin (JANUVIA) 100 MG tablet, Take 1 tablet (100 mg total) by mouth daily., Disp: 90 tablet,  Rfl: 1 .  Vitamin D, Cholecalciferol, 1000 units TABS, Take 1,000 units of lipase by mouth daily. , Disp: , Rfl:   No Known Allergies  ROS  ***  No other specific complaints in a complete review of systems (except as listed in HPI above).  Objective  There were no vitals filed for this visit. ***  There is no height or weight on file to calculate BMI.  Nursing Note and Vital Signs reviewed.  Physical Exam  ***   No results found for this or any previous visit (from the past 48 hour(s)).  Assessment & Plan  There are no diagnoses linked to this encounter.   -Red flags and when to present for emergency care or RTC including fever >101.15F, chest pain, shortness of breath, new/worsening/un-resolving symptoms, *** reviewed with patient at time of visit. Follow up and care instructions discussed and provided in AVS. -Reviewed Health Maintenance: ***

## 2018-07-21 NOTE — ED Provider Notes (Addendum)
[facility] Emergency Department Provider Note  ____________________________________________   First MD Initiated Contact with Patient 07/21/18 1647     (approximate)  I have reviewed the triage vital signs and the nursing notes.   HISTORY  Chief Complaint Altered Mental Status   HPI Jason Farrell. is a 78 y.o. male with a history of coronary artery disease was diabetes and MI was presented emergency department today with 1 week of intermittent confusion.  His wife reports that she found him looking at a clock 1 night and then "talking gibberish."  He also reports that he is having difficulty driving.  Also with a dull headache over the past week which she reports is in the occipital parietal region.  No numbness or weakness at this time and says that he is feeling improved.   Past Medical History:  Diagnosis Date  . Coronary artery disease   . Diabetes mellitus without complication ([place])   . Dyslipidemia   . GERD (gastroesophageal reflux disease)   . Hyperlipidemia   . Hypertension   . MI (myocardial infarction) ([place])   . Osteoarthritis     Patient Active Problem List   Diagnosis Date Noted  . Senile purpura ([place]) 02/21/2018  . Type 2 diabetes mellitus with microalbuminuria, without long-term current use of insulin ([place]) 02/21/2018  . Controlled type 2 diabetes mellitus without complication, without long-term current use of insulin ([place]) 11/11/2017  . 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 Obstructive sleep apnea on CPAP 12/09/2015  . Acid reflux 05/06/2015  . Arthritis, degenerative 05/06/2015  . Atherosclerosis of native coronary artery of native heart with stable angina pectoris ([place]) 01/25/2015  . Diabetes mellitus type 2 in obese ([place]) 08/16/2014  . Hyperlipidemia 08/16/2014  . Essential hypertension 08/16/2014  . Morbid obesity ([place]) 08/16/2014  . Stopped smoking with greater than 30 pack year history 08/16/2014  . COLD (chronic  obstructive lung disease) ([place]) 08/16/2014    Past Surgical History:  Procedure Laterality Date  . CARDIAC CATHETERIZATION  2006   stent placement  . COLONOSCOPY    . CORONARY ANGIOPLASTY WITH STENT PLACEMENT  2006   stent x 2   . CORONARY ANGIOPLASTY WITH STENT PLACEMENT  1996   stent x 3   . FOOT SURGERY Right   . HALLUX VALGUS CHEILECTOMY Right 02/25/2015   Procedure: HALLUX VALGUS CHEILECTOMY;  Surgeon: [name], DPM;  Location: [facility] ORS;  Service: Podiatry;  Laterality: Right;    Prior to Admission medications   Medication Sig Start Date End Date Taking? Authorizing Provider  amLODipine (NORVASC) 10 MG tablet Take 1 tablet (10 mg total) by mouth daily. 02/21/18   Steele Sizer, MD  aspirin 81 MG tablet Take 81 mg by mouth daily.    [provider]  atorvastatin (LIPITOR) 40 MG tablet Take 1 tablet (40 mg total) by mouth daily. 02/21/18   Steele Sizer, MD  Coenzyme Q10 (CO Q10) 200 MG CAPS Take 1 capsule by mouth daily.    [provider]  glipiZIDE (GLUCOTROL) 5 MG tablet Take 1 tablet (5 mg total) by mouth 2 (two) times daily before a meal. 07/04/18   Sowles, Drue Stager, MD  glucose blood test strip Use as instructed 04/14/15   Roselee Nova, MD  Influenza vac split quadrivalent PF (FLUZONE HIGH-DOSE) 0.5 ML injection Fluzone High-Dose 2019-20 (PF) 180 mcg/0.5 mL intramuscular syringe  ADM 0.5ML IM UTD    [provider]  lisinopril-hydrochlorothiazide (ZESTORETIC) 20-12.5 MG tablet Take 1  tablet by mouth 2 (two) times daily. 02/21/18   Steele Sizer, MD  metFORMIN (GLUCOPHAGE-XR) 750 MG 24 hr tablet Take 2 tablets (1,500 mg total) by mouth daily with breakfast. 02/21/18   Steele Sizer, MD  metoprolol succinate (TOPROL-XL) 50 MG 24 hr tablet Take 1 tablet (50 mg total) by mouth daily. Take with or immediately following a meal. 07/04/18   Sowles, Drue Stager, MD  Multiple Vitamins-Minerals (CENTRUM SILVER PO) Take by mouth daily.    [provider]  Omega-3 Fatty Acids (FISH OIL) 1200 MG CAPS Take 1,200 mg by mouth daily.    [provider]  omeprazole (PRILOSEC) 40 MG capsule Take 1 capsule (40 mg total) by mouth daily. 02/21/18   Steele Sizer, MD  sitaGLIPtin (JANUVIA) 100 MG tablet Take 1 tablet (100 mg total) by mouth daily. 07/04/18   Steele Sizer, MD  Vitamin D, Cholecalciferol, 1000 units TABS Take 1,000 units of lipase by mouth daily.     [provider]    Allergies Patient has no known allergies.  Family History  Problem Relation Age of Onset  . Hyperlipidemia Father   . Hypertension Father   . Alcohol abuse Father   . Cancer Sister        lung  . Hypertension Sister   . Heart disease Sister   . Hyperlipidemia Sister   . COPD Sister     Social History Social History   Tobacco Use  . Smoking status: Former Smoker    Packs/day: 1.50    Years: 35.00    Pack years: 52.50    Types: Cigarettes    Start date: 07/17/1959    Last attempt to quit: 08/27/1994    Years since quitting: 23.9  . Smokeless tobacco: Never Used  . Tobacco comment: smoking cessation materials not required  Substance Use Topics  . Alcohol use: Yes    Alcohol/week: 14.0 standard drinks    Types: 14 Glasses of wine per week  . Drug use: No    Review of Systems  Constitutional: No fever/chills Eyes: No visual changes. ENT: No sore throat. Cardiovascular: Denies chest pain. Respiratory: Denies shortness of breath. Gastrointestinal: No abdominal pain.  No nausea, no vomiting.  No diarrhea.  No constipation. Genitourinary: Negative for dysuria. Musculoskeletal: Negative for back pain. Skin: Negative for rash. Neurological: Negative for focal weakness or numbness.   ____________________________________________   PHYSICAL EXAM:  VITAL SIGNS: ED Triage Vitals  Enc Vitals Group     BP 07/21/18 1746 118/64     Pulse Rate 07/21/18 1746 68     Resp 07/21/18 1746 17     Temp 07/21/18 1746 98.3 F  (36.8 C)     Temp Source 07/21/18 1746 Oral     SpO2 07/21/18 1746 98 %     Weight 07/21/18 1517 220 lb (99.8 kg)     Height 07/21/18 1517 5\' 6"  (1.676 m)     Head Circumference --      Peak Flow --      Pain Score 07/21/18 1517 0     Pain Loc --      Pain Edu? --      Excl. in [place]? --     Constitutional: Alert and oriented. Well appearing and in no acute distress. Eyes: Conjunctivae are normal.  Head: Atraumatic. Nose: No congestion/rhinnorhea. Mouth/Throat: Mucous membranes are moist.  Neck: No stridor.   Cardiovascular: Normal rate, regular rhythm. Grossly normal heart sounds. Respiratory: Normal respiratory effort.  No retractions.  Lungs CTAB. Gastrointestinal: Soft and nontender. No distention. Musculoskeletal: No lower extremity tenderness nor edema.  No joint effusions. Neurologic:  Normal speech and language. No gross focal neurologic deficits are appreciated. Skin:  Skin is warm, dry and intact. No rash noted. Psychiatric: Mood and affect are normal. Speech and behavior are normal.  ____________________________________________   LABS (all labs ordered are listed, but only abnormal results are displayed)  Labs Reviewed  CBC WITH DIFFERENTIAL/PLATELET - Abnormal; Notable for the following components:      Result Value   WBC 10.9 (*)    Neutro Abs 8.2 (*)    All other components within normal limits  COMPREHENSIVE METABOLIC PANEL - Abnormal; Notable for the following components:   Glucose, Bld 141 (*)    AST 14 (*)    All other components within normal limits  URINALYSIS, COMPLETE (UACMP) WITH MICROSCOPIC - Abnormal; Notable for the following components:   Color, Urine YELLOW (*)    APPearance CLEAR (*)    All other components within normal limits   ____________________________________________  EKG  ED ECG REPORT I, Doran Stabler, the attending physician, personally viewed and interpreted this ECG.   Date: 07/21/2018  EKG Time: 1523  Rate: 62   Rhythm: normal sinus rhythm  Axis: Normal  Intervals: Right bundle branch block  ST&T Change: T wave inversions in 3 and aVF as well as V3.  Unlikely to be depression in V3.  Likely baseline disturbance in the first/second beat. No significant change from previous. 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  Likely arising from the junction of the tentorial leaflets.  Vasogenic edema of the adjacent posterior medial temporal lobes.  Likely invasion of the most anterior aspect of the straight sinus with encasement of the vein of [place].  Obstructive hydrocephalus at the level of the cerebral aqueduct.  Most clearly affecting the lateral ventricles.  CT head with partially calcified mass arising the region of the splenium of the corpus callosum.  Also enlargement of the lateral ventricles.  Chest x-ray without acute disease. ____________________________________________   PROCEDURES  Procedure(s) performed:   Procedures  Critical Care performed:   ____________________________________________   INITIAL IMPRESSION / ASSESSMENT AND PLAN / ED COURSE  Pertinent labs & imaging results that were available during my care of the patient were reviewed by me and considered in my medical decision making (see chart for details).  Differential diagnosis includes, but is not limited to, intracranial hemorrhage, meningitis/encephalitis, previous head trauma, cavernous venous thrombosis, tension headache, temporal arteritis, migraine or migraine equivalent, idiopathic intracranial hypertension, and non-specific headache. As part of my medical decision making, I reviewed the following data within the electronic MEDICAL RECORD NUMBER Notes from prior outpatient visits  ----------------------------------------- 8:52 PM on 07/21/2018 -----------------------------------------  Discussed the case with Dr. Lacinda Axon of  neurosurgery who says that the patient has 2 options.  Says that the patient may be transferred to [facility] but would likely be discharged and have an outpatient procedure scheduled for later in the week.  Second option would be to have the patient follow-up in the office tomorrow.  I discussed with the patient as well as family and they agree that they would like to have an MRI done here for further evaluation and then follow-up in the office tomorrow.  Patient continues to have a nonfocal neurologic exam.  I discussed the MRI results with Dr. Lacinda Axon who states that he would not like Decadron at  this time and will discuss further treatment with the patient tomorrow in the office.  Patient to be discharged at this time.  He is understanding as well as his wife, who is understanding of the diagnosis as well as the treatment and willing to comply. ____________________________________________   FINAL CLINICAL IMPRESSION(S) / ED DIAGNOSES  Meningioma.  Hydrocephalus.  NEW MEDICATIONS STARTED DURING THIS VISIT:  New Prescriptions   No medications on file     Note:  This document was prepared using Dragon voice recognition software and may include unintentional dictation errors.     Orbie Pyo, MD 07/21/18 2053    Orbie Pyo, MD 07/21/18 [phone]

## 2018-07-21 NOTE — ED Notes (Signed)
Md to eval, patient awaiting MRI of the brain w/o contrast.

## 2018-07-21 NOTE — ED Notes (Signed)
PatMRI resulted patient discharged o home. IV hep lock d/c.

## 2018-07-21 NOTE — ED Notes (Signed)
Patient off unit to MRI.

## 2018-07-21 NOTE — Progress Notes (Addendum)
Name: Jason Farrell   MRN: [id number]    DOB: 30-Jun-1941   Date:07/21/2018       Progress Note  Subjective  Chief Complaint  Chief Complaint  Patient presents with  . Altered Mental Status    patient questions intolerance to new medication. about the same sx as before during last office visit but has gotten worse. confused, not able to process thoughts.   . Headache    HPI  Patient endorses disoriented feeling ongoing intermittently for the past 3 weeks- just feels off. States 3 days ago- was having issue with finding the bathroom in his own house and states tried to speak but all his words were garbled. He went to sleep and felt fine the following morning, then later when he was driving- a way that he should know and got confused. Endorses increased urinary frequency, with an episodes of incontinence the other day. States intermittently confused since then- states coming to the doctors office today he couldn't find the office easily despite coming here for years. No further episodes of slurred speech. Denies unilateral weakness, paresthesia, syncope. Endorses dull headache in the mornings when he wakes up. Endorses some vision changes- states just had an episode 8 hours ago lasting 10 minutes blurry vision.   Patient Active Problem List   Diagnosis Date Noted  . Senile purpura ([place]) 02/21/2018  . Type 2 diabetes mellitus with microalbuminuria, without long-term current use of insulin ([place]) 02/21/2018  . Controlled type 2 diabetes mellitus without complication, without long-term current use of insulin ([place]) 11/11/2017  . 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 Obstructive sleep apnea on CPAP 12/09/2015  . Acid reflux 05/06/2015  . Arthritis, degenerative 05/06/2015  . Atherosclerosis of native coronary artery of native heart with stable angina pectoris ([place]) 01/25/2015  . Diabetes mellitus type 2 in obese ([place]) 08/16/2014  . Hyperlipidemia 08/16/2014  . Essential hypertension 08/16/2014  .  Morbid obesity ([place]) 08/16/2014  . Stopped smoking with greater than 30 pack year history 08/16/2014  . COLD (chronic obstructive lung disease) ([place]) 08/16/2014    Past Medical History:  Diagnosis Date  . Coronary artery disease   . Diabetes mellitus without complication ([place])   . Dyslipidemia   . GERD (gastroesophageal reflux disease)   . Hyperlipidemia   . Hypertension   . MI (myocardial infarction) ([place])   . Osteoarthritis     Past Surgical History:  Procedure Laterality Date  . CARDIAC CATHETERIZATION  2006   stent placement  . COLONOSCOPY    . CORONARY ANGIOPLASTY WITH STENT PLACEMENT  2006   stent x 2   . CORONARY ANGIOPLASTY WITH STENT PLACEMENT  1996   stent x 3   . FOOT SURGERY Right   . HALLUX VALGUS CHEILECTOMY Right 02/25/2015   Procedure: HALLUX VALGUS CHEILECTOMY;  Surgeon: [name], DPM;  Location: [facility] ORS;  Service: Podiatry;  Laterality: Right;    Social History   Tobacco Use  . Smoking status: Former Smoker    Packs/day: 1.50    Years: 35.00    Pack years: 52.50    Types: Cigarettes    Start date: 07/17/1959    Last attempt to quit: 08/27/1994    Years since quitting: 23.9  . Smokeless tobacco: Never Used  . Tobacco comment: smoking cessation materials not required  Substance Use Topics  . Alcohol use: Yes    Alcohol/week: 14.0 standard drinks    Types: 14 Glasses of wine per week     Current Outpatient Medications:  .  amLODipine (NORVASC) 10 MG tablet, Take 1 tablet (10 mg total) by mouth daily., Disp: 90 tablet, Rfl: 1 .  aspirin 81 MG tablet, Take 81 mg by mouth daily., Disp: , Rfl:  .  atorvastatin (LIPITOR) 40 MG tablet, Take 1 tablet (40 mg total) by mouth daily., Disp: 90 tablet, Rfl: 1 .  Coenzyme Q10 (CO Q10) 200 MG CAPS, Take 1 capsule by mouth daily., Disp: , Rfl:  .  glipiZIDE (GLUCOTROL) 5 MG tablet, Take 1 tablet (5 mg total) by mouth 2 (two) times daily before a meal., Disp: 180 tablet, Rfl: 0 .  glucose blood test strip,  Use as instructed, Disp: 100 each, Rfl: 12 .  Influenza vac split quadrivalent PF (FLUZONE HIGH-DOSE) 0.5 ML injection, Fluzone High-Dose 2019-20 (PF) 180 mcg/0.5 mL intramuscular syringe  ADM 0.5ML IM UTD, Disp: , Rfl:  .  lisinopril-hydrochlorothiazide (ZESTORETIC) 20-12.5 MG tablet, Take 1 tablet by mouth 2 (two) times daily., Disp: 180 tablet, Rfl: 1 .  metFORMIN (GLUCOPHAGE-XR) 750 MG 24 hr tablet, Take 2 tablets (1,500 mg total) by mouth daily with breakfast., Disp: 180 tablet, Rfl: 1 .  metoprolol succinate (TOPROL-XL) 50 MG 24 hr tablet, Take 1 tablet (50 mg total) by mouth daily. Take with or immediately following a meal., Disp: 90 tablet, Rfl: 0 .  Multiple Vitamins-Minerals (CENTRUM SILVER PO), Take by mouth daily., Disp: , Rfl:  .  Omega-3 Fatty Acids (FISH OIL) 1200 MG CAPS, Take 1,200 mg by mouth daily., Disp: , Rfl:  .  omeprazole (PRILOSEC) 40 MG capsule, Take 1 capsule (40 mg total) by mouth daily., Disp: 90 capsule, Rfl: 1 .  sitaGLIPtin (JANUVIA) 100 MG tablet, Take 1 tablet (100 mg total) by mouth daily., Disp: 90 tablet, Rfl: 1 .  Vitamin D, Cholecalciferol, 1000 units TABS, Take 1,000 units of lipase by mouth daily. , Disp: , Rfl:   No Known Allergies  ROS   No other specific complaints in a complete review of systems (except as listed in HPI above).  Objective  Vitals:   07/21/18 1300  BP: 120/62  Pulse: 73  Resp: 16  Temp: 97.8 F (36.6 C)  TempSrc: Oral  SpO2: 98%  Weight: 221 lb 11.2 oz (100.6 kg)  Height: 5\' 6"  (1.676 m)    Body mass index is 35.78 kg/m.  Nursing Note and Vital Signs reviewed.  Physical Exam Vitals signs reviewed.  Constitutional:      Appearance: He is well-developed. He is not ill-appearing.  HENT:     Head: Normocephalic and atraumatic.  Eyes:     General: No visual field deficit.    Extraocular Movements: Extraocular movements intact.     Right eye: No nystagmus.     Left eye: No nystagmus.     Pupils: Pupils are equal,  round, and reactive to light.  Neck:     Musculoskeletal: Normal range of motion and neck supple.     Vascular: No carotid bruit.  Cardiovascular:     Heart sounds: Normal heart sounds.  Pulmonary:     Effort: Pulmonary effort is normal.     Breath sounds: Normal breath sounds.  Abdominal:     General: Bowel sounds are normal.     Palpations: Abdomen is soft.     Tenderness: There is no abdominal tenderness.  Musculoskeletal: Normal range of motion.  Skin:    General: Skin is warm and dry.     Capillary Refill: Capillary refill takes less than 2 seconds.  Neurological:  Mental Status: He is alert and oriented to person, place, and time.     GCS: GCS eye subscore is 4. GCS verbal subscore is 5. GCS motor subscore is 6.     Cranial Nerves: No cranial nerve deficit, dysarthria or facial asymmetry.     Sensory: No sensory deficit.     Motor: No weakness.     Gait: Gait normal.     Comments: Has some slow responses to questioning, unsure if this is baseline  Psychiatric:        Speech: Speech normal.        Behavior: Behavior normal.        Thought Content: Thought content normal.        Judgment: Judgment normal.       No results found for this or any previous visit (from the past 48 hour(s)).  Assessment & Plan  78 year old man presents with intermittent episodes of confusion, slurred speech, blurry vision ongoing for the past 3 weeks states last episode was 8 hours ago, had trouble finding his way to the office today despite have been here for years. Last episode of acute blurry vision was 8 hours ago. Negative for UTI or hypoglycemia in office, no present neuro deficits- sending via ambulance to ER for further evaluation. Discussed with PCP- aggreable to plan of care. Follow-up in office after ER evaluation.  1. Confusion Send to ER via ambulance for evaluation of possible TIAs - POCT Urinalysis Dipstick - POCT Glucose (Device for Home Use)  2. Urinary incontinence,  unspecified type - POCT Urinalysis Dipstick  3. Slurred speech Speech normal at present, send to ER for evaluation of possible TIAs  4. Blurry vision None presently send to ER

## 2018-07-22 DIAGNOSIS — G911 Obstructive hydrocephalus: Secondary | ICD-10-CM | POA: Diagnosis not present

## 2018-07-22 DIAGNOSIS — D329 Benign neoplasm of meninges, unspecified: Secondary | ICD-10-CM | POA: Diagnosis not present

## 2018-07-28 NOTE — Telephone Encounter (Signed)
Jason Farrell ([place])

## 2018-07-28 NOTE — Telephone Encounter (Signed)
Jason Farrell called to let provider know she will be faxing  rx for CPAP machine today.    [phone]

## 2018-08-04 ENCOUNTER — Ambulatory Visit: Payer: Medicare Other | Admitting: Family Medicine

## 2018-08-04 NOTE — Telephone Encounter (Signed)
Pt is calling and [facility] needs the pressure setting for cpap please fax

## 2018-08-05 NOTE — Telephone Encounter (Signed)
Forms were signed again and faxed today. Called to see if forms were received. [facility] stated that supplies for CPAP was shipped out on 07/28/2018 and CPAP machine went out today.

## 2018-08-05 NOTE — Telephone Encounter (Signed)
I have faxed the forms to them twice. I called to check on the forms. She is going to fax me what she got from [place] and I will refax forms again once Dr. Ancil Boozer signs again.

## 2018-08-06 ENCOUNTER — Telehealth: Payer: Self-pay | Admitting: Family Medicine

## 2018-08-06 MED ORDER — GLUCOSE BLOOD VI STRP: ORAL_STRIP | 12 refills | Status: AC

## 2018-08-06 NOTE — Telephone Encounter (Signed)
Copied from [place]. Topic: Quick Communication - Rx Refill/Question >> Aug 06, 2018 10:16 AM Virl Axe D wrote: Medication: glucose blood test strip  Has the patient contacted their pharmacy? Yes.   (Agent: If no, request that the patient contact the pharmacy for the refill.) (Agent: If yes, when and what did the pharmacy advise?)  Preferred Pharmacy (with phone number or street name): [facility], [place] [phone] (Phone) [phone] (Fax)  Agent: Please be advised that RX refills may take up to 3 business days. We ask that you follow-up with your pharmacy.

## 2018-08-08 DIAGNOSIS — R900 Intracranial space-occupying lesion found on diagnostic imaging of central nervous system: Secondary | ICD-10-CM | POA: Diagnosis not present

## 2018-08-08 DIAGNOSIS — E119 Type 2 diabetes mellitus without complications: Secondary | ICD-10-CM | POA: Diagnosis not present

## 2018-08-08 DIAGNOSIS — K219 Gastro-esophageal reflux disease without esophagitis: Secondary | ICD-10-CM | POA: Diagnosis not present

## 2018-08-08 DIAGNOSIS — I25118 Atherosclerotic heart disease of native coronary artery with other forms of angina pectoris: Secondary | ICD-10-CM | POA: Diagnosis not present

## 2018-08-08 DIAGNOSIS — G919 Hydrocephalus, unspecified: Secondary | ICD-10-CM | POA: Diagnosis not present

## 2018-08-08 DIAGNOSIS — I10 Essential (primary) hypertension: Secondary | ICD-10-CM | POA: Diagnosis not present

## 2018-08-08 DIAGNOSIS — E782 Mixed hyperlipidemia: Secondary | ICD-10-CM | POA: Diagnosis not present

## 2018-08-08 DIAGNOSIS — G4733 Obstructive sleep apnea (adult) (pediatric): Secondary | ICD-10-CM | POA: Diagnosis not present

## 2018-08-08 DIAGNOSIS — Z01818 Encounter for other preprocedural examination: Secondary | ICD-10-CM | POA: Diagnosis not present

## 2018-08-08 DIAGNOSIS — Z9989 Dependence on other enabling machines and devices: Secondary | ICD-10-CM | POA: Diagnosis not present

## 2018-08-10 DIAGNOSIS — Z01818 Encounter for other preprocedural examination: Secondary | ICD-10-CM | POA: Diagnosis not present

## 2018-08-10 DIAGNOSIS — E46 Unspecified protein-calorie malnutrition: Secondary | ICD-10-CM | POA: Diagnosis not present

## 2018-08-10 DIAGNOSIS — G918 Other hydrocephalus: Secondary | ICD-10-CM | POA: Diagnosis not present

## 2018-08-10 DIAGNOSIS — R410 Disorientation, unspecified: Secondary | ICD-10-CM | POA: Diagnosis not present

## 2018-08-10 DIAGNOSIS — R05 Cough: Secondary | ICD-10-CM | POA: Diagnosis not present

## 2018-08-10 DIAGNOSIS — R2689 Other abnormalities of gait and mobility: Secondary | ICD-10-CM | POA: Diagnosis not present

## 2018-08-10 DIAGNOSIS — D62 Acute posthemorrhagic anemia: Secondary | ICD-10-CM | POA: Diagnosis not present

## 2018-08-10 DIAGNOSIS — H9190 Unspecified hearing loss, unspecified ear: Secondary | ICD-10-CM | POA: Diagnosis present

## 2018-08-10 DIAGNOSIS — I471 Supraventricular tachycardia: Secondary | ICD-10-CM | POA: Diagnosis not present

## 2018-08-10 DIAGNOSIS — Z8679 Personal history of other diseases of the circulatory system: Secondary | ICD-10-CM | POA: Diagnosis not present

## 2018-08-10 DIAGNOSIS — I252 Old myocardial infarction: Secondary | ICD-10-CM | POA: Diagnosis not present

## 2018-08-10 DIAGNOSIS — R29818 Other symptoms and signs involving the nervous system: Secondary | ICD-10-CM | POA: Diagnosis not present

## 2018-08-10 DIAGNOSIS — I10 Essential (primary) hypertension: Secondary | ICD-10-CM | POA: Diagnosis not present

## 2018-08-10 DIAGNOSIS — E041 Nontoxic single thyroid nodule: Secondary | ICD-10-CM | POA: Diagnosis not present

## 2018-08-10 DIAGNOSIS — I519 Heart disease, unspecified: Secondary | ICD-10-CM | POA: Diagnosis not present

## 2018-08-10 DIAGNOSIS — G4733 Obstructive sleep apnea (adult) (pediatric): Secondary | ICD-10-CM | POA: Diagnosis present

## 2018-08-10 DIAGNOSIS — I6201 Nontraumatic acute subdural hemorrhage: Secondary | ICD-10-CM | POA: Diagnosis not present

## 2018-08-10 DIAGNOSIS — I4510 Unspecified right bundle-branch block: Secondary | ICD-10-CM | POA: Diagnosis not present

## 2018-08-10 DIAGNOSIS — I517 Cardiomegaly: Secondary | ICD-10-CM | POA: Diagnosis not present

## 2018-08-10 DIAGNOSIS — R443 Hallucinations, unspecified: Secondary | ICD-10-CM | POA: Diagnosis present

## 2018-08-10 DIAGNOSIS — Z23 Encounter for immunization: Secondary | ICD-10-CM | POA: Diagnosis not present

## 2018-08-10 DIAGNOSIS — E8809 Other disorders of plasma-protein metabolism, not elsewhere classified: Secondary | ICD-10-CM | POA: Diagnosis present

## 2018-08-10 DIAGNOSIS — E871 Hypo-osmolality and hyponatremia: Secondary | ICD-10-CM | POA: Diagnosis not present

## 2018-08-10 DIAGNOSIS — D354 Benign neoplasm of pineal gland: Secondary | ICD-10-CM | POA: Diagnosis not present

## 2018-08-10 DIAGNOSIS — H47619 Cortical blindness, unspecified side of brain: Secondary | ICD-10-CM | POA: Diagnosis not present

## 2018-08-10 DIAGNOSIS — R900 Intracranial space-occupying lesion found on diagnostic imaging of central nervous system: Secondary | ICD-10-CM | POA: Diagnosis not present

## 2018-08-10 DIAGNOSIS — Z9889 Other specified postprocedural states: Secondary | ICD-10-CM | POA: Diagnosis not present

## 2018-08-10 DIAGNOSIS — R4189 Other symptoms and signs involving cognitive functions and awareness: Secondary | ICD-10-CM | POA: Diagnosis not present

## 2018-08-10 DIAGNOSIS — E1169 Type 2 diabetes mellitus with other specified complication: Secondary | ICD-10-CM | POA: Diagnosis not present

## 2018-08-10 DIAGNOSIS — G9340 Encephalopathy, unspecified: Secondary | ICD-10-CM | POA: Diagnosis not present

## 2018-08-10 DIAGNOSIS — Z955 Presence of coronary angioplasty implant and graft: Secondary | ICD-10-CM | POA: Diagnosis not present

## 2018-08-10 DIAGNOSIS — I6200 Nontraumatic subdural hemorrhage, unspecified: Secondary | ICD-10-CM | POA: Diagnosis not present

## 2018-08-10 DIAGNOSIS — I119 Hypertensive heart disease without heart failure: Secondary | ICD-10-CM | POA: Diagnosis present

## 2018-08-10 DIAGNOSIS — C700 Malignant neoplasm of cerebral meninges: Secondary | ICD-10-CM | POA: Diagnosis not present

## 2018-08-10 DIAGNOSIS — E870 Hyperosmolality and hypernatremia: Secondary | ICD-10-CM | POA: Diagnosis not present

## 2018-08-10 DIAGNOSIS — R9431 Abnormal electrocardiogram [ECG] [EKG]: Secondary | ICD-10-CM | POA: Diagnosis not present

## 2018-08-10 DIAGNOSIS — G039 Meningitis, unspecified: Secondary | ICD-10-CM | POA: Diagnosis not present

## 2018-08-10 DIAGNOSIS — R404 Transient alteration of awareness: Secondary | ICD-10-CM | POA: Diagnosis not present

## 2018-08-10 DIAGNOSIS — I472 Ventricular tachycardia: Secondary | ICD-10-CM | POA: Diagnosis not present

## 2018-08-10 DIAGNOSIS — I208 Other forms of angina pectoris: Secondary | ICD-10-CM | POA: Diagnosis not present

## 2018-08-10 DIAGNOSIS — Z7982 Long term (current) use of aspirin: Secondary | ICD-10-CM | POA: Diagnosis not present

## 2018-08-10 DIAGNOSIS — G939 Disorder of brain, unspecified: Secondary | ICD-10-CM | POA: Diagnosis not present

## 2018-08-10 DIAGNOSIS — E876 Hypokalemia: Secondary | ICD-10-CM | POA: Diagnosis not present

## 2018-08-10 DIAGNOSIS — D72829 Elevated white blood cell count, unspecified: Secondary | ICD-10-CM | POA: Diagnosis not present

## 2018-08-10 DIAGNOSIS — Z9911 Dependence on respirator [ventilator] status: Secondary | ICD-10-CM | POA: Diagnosis not present

## 2018-08-10 DIAGNOSIS — I2510 Atherosclerotic heart disease of native coronary artery without angina pectoris: Secondary | ICD-10-CM | POA: Diagnosis present

## 2018-08-10 DIAGNOSIS — M6281 Muscle weakness (generalized): Secondary | ICD-10-CM | POA: Diagnosis not present

## 2018-08-10 DIAGNOSIS — E1165 Type 2 diabetes mellitus with hyperglycemia: Secondary | ICD-10-CM | POA: Diagnosis not present

## 2018-08-10 DIAGNOSIS — G0490 Encephalitis and encephalomyelitis, unspecified: Secondary | ICD-10-CM | POA: Diagnosis not present

## 2018-08-10 DIAGNOSIS — J449 Chronic obstructive pulmonary disease, unspecified: Secondary | ICD-10-CM | POA: Diagnosis present

## 2018-08-10 DIAGNOSIS — R569 Unspecified convulsions: Secondary | ICD-10-CM | POA: Diagnosis not present

## 2018-08-10 DIAGNOSIS — R279 Unspecified lack of coordination: Secondary | ICD-10-CM | POA: Diagnosis not present

## 2018-08-10 DIAGNOSIS — C753 Malignant neoplasm of pineal gland: Secondary | ICD-10-CM | POA: Diagnosis not present

## 2018-08-10 DIAGNOSIS — I615 Nontraumatic intracerebral hemorrhage, intraventricular: Secondary | ICD-10-CM | POA: Diagnosis not present

## 2018-08-10 DIAGNOSIS — D320 Benign neoplasm of cerebral meninges: Secondary | ICD-10-CM | POA: Diagnosis present

## 2018-08-10 DIAGNOSIS — R5381 Other malaise: Secondary | ICD-10-CM | POA: Diagnosis not present

## 2018-08-10 DIAGNOSIS — H9193 Unspecified hearing loss, bilateral: Secondary | ICD-10-CM | POA: Diagnosis present

## 2018-08-10 DIAGNOSIS — F05 Delirium due to known physiological condition: Secondary | ICD-10-CM | POA: Diagnosis not present

## 2018-08-10 DIAGNOSIS — Z9989 Dependence on other enabling machines and devices: Secondary | ICD-10-CM | POA: Diagnosis not present

## 2018-08-10 DIAGNOSIS — J9600 Acute respiratory failure, unspecified whether with hypoxia or hypercapnia: Secondary | ICD-10-CM | POA: Diagnosis not present

## 2018-08-10 DIAGNOSIS — I629 Nontraumatic intracranial hemorrhage, unspecified: Secondary | ICD-10-CM | POA: Diagnosis not present

## 2018-08-10 DIAGNOSIS — J9811 Atelectasis: Secondary | ICD-10-CM | POA: Diagnosis not present

## 2018-08-10 DIAGNOSIS — Z79899 Other long term (current) drug therapy: Secondary | ICD-10-CM | POA: Diagnosis not present

## 2018-08-10 DIAGNOSIS — I6783 Posterior reversible encephalopathy syndrome: Secondary | ICD-10-CM | POA: Diagnosis not present

## 2018-08-10 DIAGNOSIS — E669 Obesity, unspecified: Secondary | ICD-10-CM | POA: Diagnosis not present

## 2018-08-10 DIAGNOSIS — E119 Type 2 diabetes mellitus without complications: Secondary | ICD-10-CM | POA: Diagnosis present

## 2018-08-10 DIAGNOSIS — R918 Other nonspecific abnormal finding of lung field: Secondary | ICD-10-CM | POA: Diagnosis not present

## 2018-08-10 DIAGNOSIS — G919 Hydrocephalus, unspecified: Secondary | ICD-10-CM | POA: Diagnosis present

## 2018-08-10 DIAGNOSIS — G9389 Other specified disorders of brain: Secondary | ICD-10-CM | POA: Diagnosis not present

## 2018-08-10 DIAGNOSIS — I25118 Atherosclerotic heart disease of native coronary artery with other forms of angina pectoris: Secondary | ICD-10-CM | POA: Diagnosis not present

## 2018-08-10 DIAGNOSIS — I4581 Long QT syndrome: Secondary | ICD-10-CM | POA: Diagnosis not present

## 2018-08-10 DIAGNOSIS — D496 Neoplasm of unspecified behavior of brain: Secondary | ICD-10-CM | POA: Diagnosis not present

## 2018-08-10 DIAGNOSIS — R112 Nausea with vomiting, unspecified: Secondary | ICD-10-CM | POA: Diagnosis not present

## 2018-08-10 DIAGNOSIS — R4182 Altered mental status, unspecified: Secondary | ICD-10-CM | POA: Diagnosis not present

## 2018-08-10 DIAGNOSIS — I609 Nontraumatic subarachnoid hemorrhage, unspecified: Secondary | ICD-10-CM | POA: Diagnosis not present

## 2018-08-10 DIAGNOSIS — G4730 Sleep apnea, unspecified: Secondary | ICD-10-CM | POA: Diagnosis not present

## 2018-08-10 DIAGNOSIS — E785 Hyperlipidemia, unspecified: Secondary | ICD-10-CM | POA: Diagnosis present

## 2018-08-10 DIAGNOSIS — S0502XA Injury of conjunctiva and corneal abrasion without foreign body, left eye, initial encounter: Secondary | ICD-10-CM | POA: Diagnosis not present

## 2018-08-10 DIAGNOSIS — D329 Benign neoplasm of meninges, unspecified: Secondary | ICD-10-CM | POA: Diagnosis not present

## 2018-08-10 DIAGNOSIS — K219 Gastro-esophageal reflux disease without esophagitis: Secondary | ICD-10-CM | POA: Diagnosis present

## 2018-08-10 DIAGNOSIS — D420 Neoplasm of uncertain behavior of cerebral meninges: Secondary | ICD-10-CM | POA: Diagnosis not present

## 2018-08-12 DIAGNOSIS — Z9889 Other specified postprocedural states: Secondary | ICD-10-CM | POA: Insufficient documentation

## 2018-08-12 HISTORY — PX: CRANIOTOMY: SHX93

## 2018-08-15 NOTE — Telephone Encounter (Signed)
Tried calling to see what Glucose meter he uses however his house number is not in service.

## 2018-08-15 NOTE — Telephone Encounter (Signed)
[facility] calling to get brand of glucose blood test strips so they can fill RX. Please advise

## 2018-08-15 NOTE — Telephone Encounter (Signed)
Ref# [id number]

## 2018-08-21 ENCOUNTER — Encounter: Payer: Self-pay | Admitting: *Deleted

## 2018-08-21 ENCOUNTER — Other Ambulatory Visit (HOSPITAL_COMMUNITY): Payer: Self-pay | Admitting: Physical Medicine and Rehabilitation

## 2018-08-21 MED ORDER — GENERIC EXTERNAL MEDICATION
1.00 | Status: DC
Start: ? — End: 2018-08-21

## 2018-08-21 MED ORDER — METOPROLOL TARTRATE 25 MG PO TABS
25.00 | ORAL_TABLET | ORAL | Status: DC
Start: 2018-08-21 — End: 2018-08-21

## 2018-08-21 MED ORDER — INSULIN LISPRO 100 UNIT/ML ~~LOC~~ SOLN
0.00 | SUBCUTANEOUS | Status: DC
Start: 2018-08-21 — End: 2018-08-21

## 2018-08-21 MED ORDER — DEXTROSE 50 % IV SOLN
12.50 | INTRAVENOUS | Status: DC
Start: ? — End: 2018-08-21

## 2018-08-21 MED ORDER — QUETIAPINE FUMARATE 25 MG PO TABS
12.50 | ORAL_TABLET | ORAL | Status: DC
Start: 2018-08-22 — End: 2018-08-21

## 2018-08-21 MED ORDER — PANTOPRAZOLE SODIUM 40 MG IV SOLR
40.00 | INTRAVENOUS | Status: DC
Start: 2018-08-22 — End: 2018-08-21

## 2018-08-21 MED ORDER — GENERIC EXTERNAL MEDICATION
Status: DC
Start: ? — End: 2018-08-21

## 2018-08-21 MED ORDER — CARBOXYMETHYLCELLULOSE SOD PF 0.5 % OP SOLN
1.00 | OPHTHALMIC | Status: DC
Start: ? — End: 2018-08-21

## 2018-08-21 MED ORDER — GENERIC EXTERNAL MEDICATION
20.00 | Status: DC
Start: ? — End: 2018-08-21

## 2018-08-21 MED ORDER — SENNOSIDES-DOCUSATE SODIUM 8.6-50 MG PO TABS
2.00 | ORAL_TABLET | ORAL | Status: DC
Start: 2018-08-21 — End: 2018-08-21

## 2018-08-21 MED ORDER — AMLODIPINE BESYLATE 5 MG PO TABS
10.00 | ORAL_TABLET | ORAL | Status: DC
Start: 2018-08-22 — End: 2018-08-21

## 2018-08-21 MED ORDER — ACETAMINOPHEN 325 MG PO TABS
975.00 | ORAL_TABLET | ORAL | Status: DC
Start: ? — End: 2018-08-21

## 2018-08-21 MED ORDER — INSULIN GLARGINE 100 UNIT/ML ~~LOC~~ SOLN
12.00 | SUBCUTANEOUS | Status: DC
Start: 2018-08-21 — End: 2018-08-21

## 2018-08-21 MED ORDER — GLUCOSE 40 % PO GEL
15.00 | ORAL | Status: DC
Start: ? — End: 2018-08-21

## 2018-08-21 MED ORDER — ONDANSETRON HCL 4 MG/2ML IJ SOLN
4.00 | INTRAMUSCULAR | Status: DC
Start: ? — End: 2018-08-21

## 2018-08-21 MED ORDER — ATORVASTATIN CALCIUM 40 MG PO TABS
40.00 | ORAL_TABLET | ORAL | Status: DC
Start: 2018-08-22 — End: 2018-08-21

## 2018-08-21 MED ORDER — QUETIAPINE FUMARATE 25 MG PO TABS
50.00 | ORAL_TABLET | ORAL | Status: DC
Start: 2018-08-21 — End: 2018-08-21

## 2018-08-21 MED ORDER — POLYETHYLENE GLYCOL 3350 17 G PO PACK
17.00 | PACK | ORAL | Status: DC
Start: 2018-08-21 — End: 2018-08-21

## 2018-08-21 MED ORDER — BISACODYL 10 MG RE SUPP
10.00 | RECTAL | Status: DC
Start: ? — End: 2018-08-21

## 2018-08-21 MED ORDER — HYDRALAZINE HCL 20 MG/ML IJ SOLN
10.00 | INTRAMUSCULAR | Status: DC
Start: ? — End: 2018-08-21

## 2018-08-21 MED ORDER — ASPIRIN EC 81 MG PO TBEC
81.00 | DELAYED_RELEASE_TABLET | ORAL | Status: DC
Start: 2018-08-22 — End: 2018-08-21

## 2018-08-21 MED ORDER — MELATONIN 1 MG PO TABS
3.00 | ORAL_TABLET | ORAL | Status: DC
Start: 2018-08-21 — End: 2018-08-21

## 2018-08-21 NOTE — PMR Pre-admission (Shared)
[facility] PMR Admission Coordinator Pre-Admission Assessment  Patient: Jason Farrell. is an 78 y.o., male MRN: [id number] DOB: 1940/09/29 Height:   Weight:    Insurance Information HMO:     PPO:      PCP:      IPA:      80/20:      OTHER: no HMO PRIMARY: Medicare a and b      Policy#: [id number]      Subscriber: pt Benefits:  Phone #: passport one online     Name: 08/21/2018 Eff. Date: 02/14/2008     Deduct: $1408      Out of Pocket Max: none      Life Max: none CIR: 100%      SNF: 20 full days Outpatient: 80%     Co-Pay: 20% Home Health: 100%      Co-Pay: none DME: 80%     Co-Pay: 20% Providers: pt choice  SECONDARY: [employer]      Policy#: [id number]   Medicaid Application Date:       Case Manager:  Disability Application Date:       Case Worker:   Emergency Contact Information Contact Information    Name Relation Home Work Mobile   Devall,[name] Spouse   [phone]      Current Medical History  Patient Admitting Diagnosis: Resection of pineal region mass  History of Present Illness: Patient presented on 08/11/2018 for evaluation after being seen in the ED 1/26 for headaches and symptoms of difficulty with speech, sleep disturbance and behavioral changes since onset of summer. Significant worsening over past 3 to 4 weeks. Patient wandering, emotionally labile and crying frequently. 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  EVD/subgaleal drain removed with post removal hemorrhage. Neurology consulted. CT demonstrated intra ventricular hemorrhage in the anterior horn of the right lateral ventricle.  CTA with concern for R V4 segment stenosis, continuing decadron taper and Keppra.  Postoperative hospital course noted with delirium with pt receiving Seroquel, sleep hygiene and tapering steroids.  Left eye corneal abrasion responded to proparacaine, fluorescein dye exam , as well as continued erythromycin ointment to the affected eye for 4 days.  Patient  developed confusion and bilateral loss of vision on POD 3. Patient had EVD pulled on 1/30 and developed intraventricular hemorrhage, confusion and bilateral vision loss afterwards. Vision grossly intact before. Ophthalmology consulted. 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 Felt no intervention needed. PRES felt may take several weeks to recover at which patients may have variable degree of visual recovery,. To follow up as an outpatient.   Patient was scheduled to admit to CIR last week, but he had increased confusion and altered mental status.  He was in mittens due to pulling out lines.  He is now pleasantly confused and out of mittens.  He is participating well with therapies and is now ready for a comprehensive inpatient rehab program.  Patient's medical [facility] has been reviewed by the rehabilitation admission coordinator and physician.  Past Medical History  Past Medical History:  Diagnosis Date  . Coronary artery disease   . Diabetes mellitus without complication ([place])   . Dyslipidemia   . GERD (gastroesophageal reflux disease)   . Hyperlipidemia   . Hypertension   . MI (myocardial infarction) ([place])   . Osteoarthritis     Family History   family history includes Alcohol abuse in his father; COPD in his sister; Cancer in his sister; Heart disease in  his sister; Hyperlipidemia in his father and sister; Hypertension in his father and sister.  Prior Rehab/Hospitalizations Has the patient had major surgery during 100 days prior to admission? No   Current Medications  Current Meds since 08/11/2018  See MAR  Current Outpatient Medications:  .  amLODipine (NORVASC) 10 MG tablet, Take 1 tablet (10 mg total) by mouth daily., Disp: 90 tablet, Rfl: 1 .  aspirin 81 MG tablet, Take 81 mg by mouth daily., Disp: , Rfl:  .  atorvastatin (LIPITOR) 40 MG tablet, Take 1 tablet (40 mg total) by mouth daily., Disp: 90 tablet, Rfl: 1 .   Coenzyme Q10 (CO Q10) 200 MG CAPS, Take 1 capsule by mouth daily., Disp: , Rfl:  .  glipiZIDE (GLUCOTROL) 5 MG tablet, Take 1 tablet (5 mg total) by mouth 2 (two) times daily before a meal., Disp: 180 tablet, Rfl: 0 .  glucose blood test strip, Use as instructed, Disp: 100 each, Rfl: 12 .  Influenza vac split quadrivalent PF (FLUZONE HIGH-DOSE) 0.5 ML injection, Fluzone High-Dose 2019-20 (PF) 180 mcg/0.5 mL intramuscular syringe  ADM 0.5ML IM UTD, Disp: , Rfl:  .  lisinopril-hydrochlorothiazide (ZESTORETIC) 20-12.5 MG tablet, Take 1 tablet by mouth 2 (two) times daily., Disp: 180 tablet, Rfl: 1 .  metFORMIN (GLUCOPHAGE-XR) 750 MG 24 hr tablet, Take 2 tablets (1,500 mg total) by mouth daily with breakfast., Disp: 180 tablet, Rfl: 1 .  metoprolol succinate (TOPROL-XL) 50 MG 24 hr tablet, Take 1 tablet (50 mg total) by mouth daily. Take with or immediately following a meal., Disp: 90 tablet, Rfl: 0 .  Multiple Vitamins-Minerals (CENTRUM SILVER PO), Take by mouth daily., Disp: , Rfl:  .  Omega-3 Fatty Acids (FISH OIL) 1200 MG CAPS, Take 1,200 mg by mouth daily., Disp: , Rfl:  .  omeprazole (PRILOSEC) 40 MG capsule, Take 1 capsule (40 mg total) by mouth daily., Disp: 90 capsule, Rfl: 1 .  sitaGLIPtin (JANUVIA) 100 MG tablet, Take 1 tablet (100 mg total) by mouth daily., Disp: 90 tablet, Rfl: 1 .  Vitamin D, Cholecalciferol, 1000 units TABS, Take 1,000 units of lipase by mouth daily. , Disp: , Rfl:   Patients Current Diet:   Diet Order    None      Precautions / Restrictions   Fall precautions Impulsive with decreased safety awareness  Has the patient had 2 or more falls or a fall with injury in the past year?No  Prior Activity Level Community (5-7x/wk): was independent, active; driving  Declining memory since summer  Prior Functional Level Do you want Prior Function Level of Independence: Independent(but with shuffling gait) from other? Self Care: Did the patient need help bathing,  dressing, using the toilet or eating?  Independent  Indoor Mobility: Did the patient need assistance with walking from room to room (with or without device)? Independent  Stairs: Did the patient need assistance with internal or external stairs (with or without device)? Independent  Functional Cognition: Did the patient need help planning regular tasks such as shopping or remembering to take medications? Independent  Home Assistive Devices / Equipment Home Assistive Devices/Equipment: None  Prior Device Use: Indicate devices/aids used by the patient prior to current illness, exacerbation or injury? None of the above  Prior Functional Level Independent   Prior Functional Level Current Functional Level  Bed Mobility   Independent  mod assist with rail with raised HOB and 2 person assist. multiple verbal and tactile cues and increased time to complete.   Transfers Independent  Min assist with RW and 2 person assist.  Mobility - Walk/Wheelchair Independent ; shuffling gait   impulsive, inconsistent in following directions, requires cuing; unable to accurately report objects held in visual field.  Mobility - Ambulation/Gait   shuffling gait   bed to chair; requires multimodal cuing 1 step command. 2/5 ambulated 30 feet with RW min assist of 1 and close chair follow. Verbal and tactile cues to leave RW on the ground when ambulating. Pushing RW too far in front of him during gait. Decreased step length and speed.   Upper Body Dressing independent   mod assist doff/don gown  Lower Body Dressing independent   toilet hygiene max assist; mod assist LB dressing  Grooming independent   standing at sink with mod assist to wash hands  Eating/Drinking independent Mod assist  Toilet Transfer independent Mod assist  Bladder Continence continent Had foley catheter  Bowel Management  continent    Stair Climbing independent Dependent  Communication intact    Memory Increasing confusion since summer    grossly functional oropharyngeal swallow; mod cognitive impairment. Self feeding difficulty due to visuospatial deficits.  Functional 1 and 2 step command following. Oriented to self. impairments in attention, memory, delayed recall and executive problem solving.   Cooking/Meal Prep  independent     Housework  independent   Money Management  Wife assisted   Driving  independent     Special needs/care consideration BiPAP/CPAP yes CPAP pta CPM n/a Continuous Drip IV: n/a Dialysis n/a Life Vest n/a Oxygen n/a Special Bed n/a Trach Size n/a Wound Vac n/a Skin WOC consulted 08/12/2018. Face and chest skin breakdown. Patient underwent 11 hour surgery in prone position 08/11/2018. Purple ecchymotic area to right upper lip. Unable to determine if it is deep tissue injury or true ecchymosis, but appears to be latter. Anterior upper chest just above nipple line with 6 cm X 23 cm non blanchable erythema. No broken skin present. Skin marker used to outline erythema. Remainder of skin outside of surgical incisions unremarkable. No treatment required. recommended to continue to monitor chest erythema for evolution of wound. May result in serous filled blisters which would require treatment. WOC consulted 08/12/2018 . Scant serosanguinous drainage to occipital dressing; right frontal dressing dray.  Bowel mgmt: LBM 2/4 continent Bladder mgmt: able to tell RN when needs to void and able to use urinal; urgency noted Diabetic mgmt yes  Previous Home Environment Living Arrangements: Spouse/significant other  Lives With: Spouse Available Help at Discharge: Family, Available 24 hours/day(wife) Type of Home: House Home Layout: Two level, Able to live on main level with bedroom/bathroom Alternate Level Stairs-Rails: Left Alternate Level Stairs-Number of Steps: 3 steps, landing and then 12 steps Home Access: Stairs to enter Entrance Stairs-Rails: Right, Left, Can reach both Entrance Stairs-Number of Steps:  3 Bathroom Shower/Tub: Tub/shower unit, Curtain(downstairs bathroom) Biochemist, clinical: Standard Bathroom Accessibility: Yes How Accessible: Accessible via walker [place]: No Additional Comments: can set up bedroom downstairs  Discharge Living Setting Plans for Discharge Living Setting: Patient's home, Lives with (comment)(wife) Type of Home at Discharge: House Discharge Home Layout: Two level, Able to live on main level with bedroom/bathroom, Full bath on main level Alternate Level Stairs-Rails: Left Alternate Level Stairs-Number of Steps: 3 steps, landing, then 12 steps Discharge Home Access: Stairs to enter Entrance Stairs-Rails: Right, Left, Can reach both Entrance Stairs-Number of Steps: 3 Discharge Bathroom Shower/Tub: Tub/shower unit, Curtain(on main level) Discharge Bathroom Toilet: Standard Discharge Bathroom Accessibility: Yes How Accessible: Accessible  via walker Does the patient have any problems obtaining your medications?: No  Social/Family/Support Systems Patient Roles: Spouse, Parent Contact Information: wife, Manuela Schwartz cell [phone] Anticipated Caregiver: wife Anticipated Caregiver's Contact Information: [phone] Ability/Limitations of Caregiver: no limitations Caregiver Availability: 24/7 Discharge Plan Discussed with Primary Caregiver: Yes Is Caregiver In Agreement with Plan?: Yes Does Caregiver/Family have Issues with Lodging/Transportation while Pt is in Rehab?: No  Goals/Additional Needs Patient/Family Goal for Rehab: supervision to min asisst with PT, OT, and SLP Expected length of stay: ELOS 10 to 14 days Pt/Family Agrees to Admission and willing to participate: Yes Program Orientation Provided & Reviewed with Pt/Caregiver Including Roles  & Responsibilities: Yes  Patient Condition: I have reviewed medical records from [facility], spoken with  CM, and spouse. I discussed via phone and reviewed faxed clinical information  for inpatient  rehabilitation assessment.  Patient will benefit from ongoing PT, OT, and SLP, can actively participate in 3 hours of therapy a day 5 days of the week, and can make measurable gains during the admission.  Patient will also benefit from the coordinated team approach during an Inpatient Acute Rehabilitation admission.  The patient will receive intensive therapy as well as Rehabilitation physician, nursing, social worker, and care management interventions.  Due to bowel management, bladder management, safety, skin/wound care, disease management, medical administration, pain management, patient education the patient requires 24 hour a day rehabilitation nursing.  The patient is currently min to mod assist with 1 step commands and cuing with mobility and basic ADLs.  Discharge setting and therapy post discharge at  home with home health is anticipated.  Patient has agreed to participate in the Acute Inpatient Rehabilitation Program and will admit today  Preadmission Screen Completed By:  Cleatrice Burke RN MSN, 08/21/2018 2:47 PM ______________________________________________________________________   Discussed status with Dr. Letta Pate  on  08/28/2018 at 1025 and received telephone approval for admission today.  Admission Coordinator:  Cleatrice Burke RN MSN, time 1025/Date 08/28/2018   Assessment/Plan: Diagnosis: 1. Does the need for close, 24 hr/day  Medical supervision in concert with the patient's rehab needs make it unreasonable for this patient to be served in a less intensive setting? {yes_no_potentially:3041433} 2. Co-Morbidities requiring supervision/potential complications: *** 3. Due to {due WI:2035597}, does the patient require 24 hr/day rehab nursing? {yes_no_potentially:3041433} 4. Does the patient require coordinated care of a physician, rehab nurse, {coordinated CBUL:8453646} to address physical and functional deficits in the context of the above medical diagnosis(es)?  {yes_no_potentially:3041433} Addressing deficits in the following areas: {deficits:3041436} 5. Can the patient actively participate in an intensive therapy program of at least 3 hrs of therapy 5 days a week? {yes_no_potentially:3041433} 6. The potential for patient to make measurable gains while on inpatient rehab is {potential:3041437} 7. Anticipated functional outcomes upon discharge from inpatients are: {functional outcomes:304600100} PT, {functional outcomes:304600100} OT, {functional outcomes:304600100} SLP 8. Estimated rehab length of stay to reach the above functional goals is: *** 9. Anticipated D/C setting: {anticipated dc setting:21604} 10. Anticipated post D/C treatments: {post dc treatment:21605} 11. Overall Rehab/Functional Prognosis: {potential:3041437}      Cleatrice Burke RN MSN 08/21/2018

## 2018-08-21 NOTE — H&P (Addendum)
Please note correct date of H and P 08/28/2018 Physical Medicine and Rehabilitation Admission H&P    CC: Functional deficits post tumor resection.    HPI: Jason Farrell os a 78 year old male with history of CAD, T2DM, HTN, OSA--compliant with CPAP who was evaluated in the ED 07/21/18 with 3-4 week history of intermittent bouts of confusion with lability, headaches, difficulty speaking and visual changes.  MRI brain revealed large meningioma level of cerebral aqueduct with vasogenic edema of posterior medial temporal lobes and obstructive hydrocephalus.  He was referred to [facility] for evaluation and underwent Craniotomy with resection of pineal region mass and placement of EVD on 08/11/2017 by  Dr. [name]. Post procedure treated with decadron and IV Keppra X 7 days.  Drain removed with 1/30 and on 1/31 he developed decrease in LOC requiring transfer to ICU. 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  MRI brain showed small amount of hemorrhage at resection site with focal hemorrhage/clot within right ventricle and unchanged appearance of increased T2/Flair signal.  CTA head/neck done reveling narrow/diminshed flow in right V4 segment question due to dissection, thrombus or vasospasm.   He with resultant confusion with agitation and loss of bilateral vision. Neurology felt that patient's MS changes were felt to be due to delirium and did not feel seizures were cause -- d/c Keppra after 7 days.   Repeat CT head showed no change in small left parietal SDH with moderate R>L ventriculomegaly and no significant interval change.  Ophthalmology  (Dr. 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 To monitor over several weeks and to follow up with neuroophthalmologist in the future.  Left corneal abrasion treated with proparacaine and erythromycin ointment. ABLA being monitored,  hypocalcemia/hypokalemia treated with supplementation and leucocytosis felt to be reactive due to steriods.   Plans were to admit patient to CIR on 02/07 but he developed increase in confusion and lethargy. CBC showed rise in WBC to 30 and LP/BC done for work up. CSF/BC negative. Repeat CT head showed improvement in intraventricular blood and intraventricular air. Leucocytosis felt to be due to steroids.  Seroquel added at nights to help with sleep wake disruption and Zyprexa being used prn agitation during the day. He developed SVT with HR up to 150's on 2/11 and lopressor added with resolution.  On dysphagia 3 diet with question of aspiration --MBS not done due to cognitive deficits as well as body habitus. He continues to be confused, is oriented to self only, has visual deficits and responds to visual threat at times, continues to wax and wane with poor safety awarenss and impulsivity. Therapy ongoing and he is showing improvement with therapy therefore CIR recommended due to functional deficits.     Review of Systems  Unable to perform ROS: Mental acuity  Constitutional: Negative for fever.  HENT: Positive for hearing loss.   Eyes:       Denies visual deficits  Respiratory: Positive for cough. Negative for shortness of breath.   Cardiovascular: Negative for chest pain, palpitations and leg swelling.  Gastrointestinal: Negative for heartburn.  Musculoskeletal: Negative for myalgias.  Neurological: Positive for weakness and headaches.  Psychiatric/Behavioral: Positive for memory loss.      Past Medical History:  Diagnosis Date  . Coronary artery disease   . Diabetes mellitus without complication ([place])   . Dyslipidemia   . GERD (gastroesophageal reflux  disease)   . Hyperlipidemia   . Hypertension   . MI (myocardial infarction) ([place])   . Osteoarthritis     Past Surgical History:  Procedure Laterality Date  . CARDIAC CATHETERIZATION  2006   stent placement  . COLONOSCOPY    .  CORONARY ANGIOPLASTY WITH STENT PLACEMENT  2006   stent x 2   . CORONARY ANGIOPLASTY WITH STENT PLACEMENT  1996   stent x 3   . FOOT SURGERY Right   . HALLUX VALGUS CHEILECTOMY Right 02/25/2015   Procedure: HALLUX VALGUS CHEILECTOMY;  Surgeon: [name], DPM;  Location: [facility] ORS;  Service: Podiatry;  Laterality: Right;    Family History  Problem Relation Age of Onset  . Hyperlipidemia Father   . Hypertension Father   . Alcohol abuse Father   . Cancer Sister        lung  . Hypertension Sister   . Heart disease Sister   . Hyperlipidemia Sister   . COPD Sister     Social History:  Married. Retired IT trainer. Per  reports that he quit smoking about 24 years ago. His smoking use included cigarettes. He started smoking about 59 years ago. He has a 52.50 pack-year smoking history. He has never used smokeless tobacco. He reports current alcohol use of about 14.0 standard drinks of alcohol per week. He reports that he does not use drugs.    Allergies: No Known Allergies (Not in a hospital admission)   Drug Regimen Review  Drug regimen was reviewed and remains appropriate with no significant issues identified  Home:     Functional History: Was independent prior to a 6 weeks.   Functional Status:  Mobility: Max to mod assist with bed mobility. Very close SBA for balance at EOB due to impulsivity Min assist for sit to stand transfers Mod assist+2 to pivot from chair to bed Min assist to ambulate 39' with RW with multimodal cues    ADL: Min assist for grooming.  Requires hand over hand assist with mod cues to complete tasks and for feeding. Mod assist for dressing.    Cognition:       There were no vitals taken for this visit. Physical Exam  Nursing note and vitals reviewed. Constitutional: He appears well-developed and well-nourished.  Lying in bed with wet sounds--occasional coughing due to oral secretions.   HENT:  Posterior crani incision C/D/I. Dry  scabs on lips.   Respiratory:  Coarse upper airway sounds due to secretions.   Neurological: He is alert.  Oriented to self only. Needed cues for orientation to city/state.  Looks straight ahead with inability to see one foot in front of his eye or in periphery but has visual hallucinations --asking about "clothes on the floor".  Question right sided weakness? Apraxia?    General: No acute distress Mood and affect are appropriate Heart: Regular rate and rhythm no rubs murmurs or extra sounds Lungs: Clear to auscultation, breathing unlabored, no rales or wheezes Abdomen: Positive bowel sounds, soft nontender to palpation, nondistended Extremities: No clubbing, cyanosis, or edema Skin: No evidence of breakdown, no evidence of rash Neurologic:Right CN 6 palsy, motor strength is 5/5 in bilateral deltoid, bicep, tricep, grip, 4/5 bilateral hip flexor, knee extensors, ankle dorsiflexor and plantar flexor Sensory exam normal sensation to light touchin bilateral upper and lower extremities Cerebellar examdeferred due to vision Musculoskeletal: Full range of motion in all 4 extremities. No joint swelling  Recent labs.    No results found.  Medical Problem List and Plan: 1.  Decline in mobility and self care secondary to Meningioma with post operative IVH and SDH 2.  DVT Prophylaxis/Anticoagulation: Pharmaceutical: Lovenox 3. Pain Management: Tylenol prn 4. Mood: LCSW to follow for evaluation and support.  5. Neuropsych: This patient is not fully capable of making decisions on his own behalf. 6. Skin/Wound Care: Routine pressure relief measures.  7. Fluids/Electrolytes/Nutrition: Monitor I/O. Check lytes in am.  8. T2DM: Was on Januvia, metformin and glucotrol PTA--Monitor BS ac/hs. Will use SSI for elevated BS.  Monitor CBG    9. HTN: Monitor BP bid. Controlled without medications at this time. --on  10.  SVT: Monitor HR bid--controlled on metoprolol 25 mg qid.  11.  OSA: Continue to  encourage CPAP use.  12. CAD: Stable. Continue low dose ASA and BB.  13. Delirium/encephalopathy; Resolving. Monitor for worsening with change of environment. On Seroquel at nights.  Sleep wake chart to monitor sleep pattern. Frequent checks and Telesitter for safety.  14. Leucocytosis: Resolving.  In th past week from 30.1--> 19.3--> 9.2 today. Cultures/work up negative. Likely steroid related 15. Hypokalemia: Drop in K-3.4 today. Will add supplement. Due to recent SVT likely  need to keep K>/+ 4.0.  16.  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  Additionally, I have reviewed and concur with the physician assistant's documentation above." [name] M.D. [facility] FAAPM&R (Sports Med, Neuromuscular Med) Diplomate [place] Board of Electrodiagnostic Med  Jason Leriche, PA-C 08/21/2018

## 2018-08-28 ENCOUNTER — Inpatient Hospital Stay (HOSPITAL_COMMUNITY): Payer: Medicare Other

## 2018-08-28 ENCOUNTER — Other Ambulatory Visit: Payer: Self-pay

## 2018-08-28 ENCOUNTER — Inpatient Hospital Stay (HOSPITAL_COMMUNITY)
Admission: RE | Admit: 2018-08-28 | Discharge: 2018-08-30 | DRG: 091 | Disposition: A | Discharge status: Other Acute Inpatient Hospital | Payer: Medicare Other | Source: Intra-hospital | Attending: Internal Medicine | Admitting: Internal Medicine

## 2018-08-28 ENCOUNTER — Encounter (HOSPITAL_COMMUNITY): Payer: Self-pay | Admitting: *Deleted

## 2018-08-28 DIAGNOSIS — E1169 Type 2 diabetes mellitus with other specified complication: Secondary | ICD-10-CM | POA: Diagnosis not present

## 2018-08-28 DIAGNOSIS — R5381 Other malaise: Secondary | ICD-10-CM | POA: Diagnosis not present

## 2018-08-28 DIAGNOSIS — I252 Old myocardial infarction: Secondary | ICD-10-CM

## 2018-08-28 DIAGNOSIS — H9193 Unspecified hearing loss, bilateral: Secondary | ICD-10-CM | POA: Diagnosis present

## 2018-08-28 DIAGNOSIS — I4510 Unspecified right bundle-branch block: Secondary | ICD-10-CM | POA: Diagnosis not present

## 2018-08-28 DIAGNOSIS — D72829 Elevated white blood cell count, unspecified: Secondary | ICD-10-CM

## 2018-08-28 DIAGNOSIS — Z23 Encounter for immunization: Secondary | ICD-10-CM

## 2018-08-28 DIAGNOSIS — K219 Gastro-esophageal reflux disease without esophagitis: Secondary | ICD-10-CM | POA: Diagnosis present

## 2018-08-28 DIAGNOSIS — Z955 Presence of coronary angioplasty implant and graft: Secondary | ICD-10-CM

## 2018-08-28 DIAGNOSIS — E119 Type 2 diabetes mellitus without complications: Secondary | ICD-10-CM | POA: Diagnosis present

## 2018-08-28 DIAGNOSIS — R05 Cough: Secondary | ICD-10-CM

## 2018-08-28 DIAGNOSIS — G4733 Obstructive sleep apnea (adult) (pediatric): Secondary | ICD-10-CM | POA: Diagnosis present

## 2018-08-28 DIAGNOSIS — Z8249 Family history of ischemic heart disease and other diseases of the circulatory system: Secondary | ICD-10-CM

## 2018-08-28 DIAGNOSIS — R410 Disorientation, unspecified: Secondary | ICD-10-CM | POA: Diagnosis not present

## 2018-08-28 DIAGNOSIS — I471 Supraventricular tachycardia, unspecified: Secondary | ICD-10-CM

## 2018-08-28 DIAGNOSIS — I10 Essential (primary) hypertension: Secondary | ICD-10-CM | POA: Diagnosis present

## 2018-08-28 DIAGNOSIS — G9389 Other specified disorders of brain: Secondary | ICD-10-CM

## 2018-08-28 DIAGNOSIS — G9340 Encephalopathy, unspecified: Secondary | ICD-10-CM | POA: Diagnosis not present

## 2018-08-28 DIAGNOSIS — H47619 Cortical blindness, unspecified side of brain: Secondary | ICD-10-CM | POA: Diagnosis present

## 2018-08-28 DIAGNOSIS — E46 Unspecified protein-calorie malnutrition: Secondary | ICD-10-CM

## 2018-08-28 DIAGNOSIS — D62 Acute posthemorrhagic anemia: Secondary | ICD-10-CM | POA: Diagnosis present

## 2018-08-28 DIAGNOSIS — D329 Benign neoplasm of meninges, unspecified: Secondary | ICD-10-CM | POA: Diagnosis not present

## 2018-08-28 DIAGNOSIS — E8809 Other disorders of plasma-protein metabolism, not elsewhere classified: Secondary | ICD-10-CM

## 2018-08-28 DIAGNOSIS — R443 Hallucinations, unspecified: Secondary | ICD-10-CM | POA: Diagnosis present

## 2018-08-28 DIAGNOSIS — Z87891 Personal history of nicotine dependence: Secondary | ICD-10-CM

## 2018-08-28 DIAGNOSIS — I615 Nontraumatic intracerebral hemorrhage, intraventricular: Secondary | ICD-10-CM | POA: Diagnosis present

## 2018-08-28 DIAGNOSIS — Z8679 Personal history of other diseases of the circulatory system: Secondary | ICD-10-CM

## 2018-08-28 DIAGNOSIS — R059 Cough, unspecified: Secondary | ICD-10-CM

## 2018-08-28 DIAGNOSIS — Z7982 Long term (current) use of aspirin: Secondary | ICD-10-CM | POA: Diagnosis not present

## 2018-08-28 DIAGNOSIS — I2510 Atherosclerotic heart disease of native coronary artery without angina pectoris: Secondary | ICD-10-CM | POA: Diagnosis present

## 2018-08-28 DIAGNOSIS — R2689 Other abnormalities of gait and mobility: Principal | ICD-10-CM | POA: Diagnosis present

## 2018-08-28 DIAGNOSIS — E785 Hyperlipidemia, unspecified: Secondary | ICD-10-CM | POA: Diagnosis present

## 2018-08-28 DIAGNOSIS — J449 Chronic obstructive pulmonary disease, unspecified: Secondary | ICD-10-CM | POA: Diagnosis present

## 2018-08-28 DIAGNOSIS — I25118 Atherosclerotic heart disease of native coronary artery with other forms of angina pectoris: Secondary | ICD-10-CM

## 2018-08-28 DIAGNOSIS — E876 Hypokalemia: Secondary | ICD-10-CM

## 2018-08-28 DIAGNOSIS — I6200 Nontraumatic subdural hemorrhage, unspecified: Secondary | ICD-10-CM | POA: Diagnosis present

## 2018-08-28 DIAGNOSIS — Z79899 Other long term (current) drug therapy: Secondary | ICD-10-CM

## 2018-08-28 DIAGNOSIS — I6783 Posterior reversible encephalopathy syndrome: Secondary | ICD-10-CM | POA: Diagnosis present

## 2018-08-28 DIAGNOSIS — I208 Other forms of angina pectoris: Secondary | ICD-10-CM | POA: Diagnosis not present

## 2018-08-28 DIAGNOSIS — E669 Obesity, unspecified: Secondary | ICD-10-CM | POA: Diagnosis not present

## 2018-08-28 DIAGNOSIS — Z7984 Long term (current) use of oral hypoglycemic drugs: Secondary | ICD-10-CM

## 2018-08-28 DIAGNOSIS — Z8349 Family history of other endocrine, nutritional and metabolic diseases: Secondary | ICD-10-CM

## 2018-08-28 LAB — GLUCOSE, CAPILLARY
GLUCOSE-CAPILLARY: 198 mg/dL — AB (ref 70–99)
Glucose-Capillary: 132 mg/dL — ABNORMAL HIGH (ref 70–99)

## 2018-08-28 MED ORDER — PANTOPRAZOLE SODIUM 40 MG PO TBEC
40.0000 mg | DELAYED_RELEASE_TABLET | Freq: Every day | ORAL | Status: DC
  Administered 2018-08-29 – 2018-08-30 (×2): 40 mg via ORAL
  Filled 2018-08-28 (×2): qty 1

## 2018-08-28 MED ORDER — PROCHLORPERAZINE MALEATE 5 MG PO TABS: 5.0000 mg | ORAL_TABLET | Freq: Four times a day (QID) | ORAL | Status: DC | PRN

## 2018-08-28 MED ORDER — POTASSIUM CHLORIDE CRYS ER 20 MEQ PO TBCR: 20.0000 meq | EXTENDED_RELEASE_TABLET | Freq: Two times a day (BID) | ORAL | Status: DC

## 2018-08-28 MED ORDER — ACETAMINOPHEN 325 MG PO TABS
325.0000 mg | ORAL_TABLET | ORAL | Status: DC | PRN
  Administered 2018-08-28 – 2018-08-30 (×3): 650 mg via ORAL
  Filled 2018-08-28 (×3): qty 2

## 2018-08-28 MED ORDER — GUAIFENESIN-DM 100-10 MG/5ML PO SYRP: 5.0000 mL | ORAL_SOLUTION | Freq: Four times a day (QID) | ORAL | Status: DC | PRN

## 2018-08-28 MED ORDER — PROCHLORPERAZINE 25 MG RE SUPP: 12.5000 mg | Freq: Four times a day (QID) | RECTAL | Status: DC | PRN

## 2018-08-28 MED ORDER — PROCHLORPERAZINE EDISYLATE 10 MG/2ML IJ SOLN: 5.0000 mg | Freq: Four times a day (QID) | INTRAMUSCULAR | Status: DC | PRN

## 2018-08-28 MED ORDER — INSULIN ASPART 100 UNIT/ML ~~LOC~~ SOLN
0.0000 [IU] | Freq: Three times a day (TID) | SUBCUTANEOUS | Status: DC
  Administered 2018-08-28: 2 [IU] via SUBCUTANEOUS
  Administered 2018-08-29: 3 [IU] via SUBCUTANEOUS
  Administered 2018-08-29 (×2): 2 [IU] via SUBCUTANEOUS
  Administered 2018-08-30: 5 [IU] via SUBCUTANEOUS
  Administered 2018-08-30: 2 [IU] via SUBCUTANEOUS

## 2018-08-28 MED ORDER — BISACODYL 10 MG RE SUPP: 10.0000 mg | Freq: Every day | RECTAL | Status: DC | PRN

## 2018-08-28 MED ORDER — ALUM & MAG HYDROXIDE-SIMETH 200-200-20 MG/5ML PO SUSP: 30.0000 mL | ORAL | Status: DC | PRN

## 2018-08-28 MED ORDER — FLEET ENEMA 7-19 GM/118ML RE ENEM: 1.0000 | ENEMA | Freq: Once | RECTAL | Status: DC | PRN

## 2018-08-28 MED ORDER — POTASSIUM CHLORIDE CRYS ER 20 MEQ PO TBCR
20.0000 meq | EXTENDED_RELEASE_TABLET | Freq: Two times a day (BID) | ORAL | Status: DC
  Administered 2018-08-29 – 2018-08-30 (×3): 20 meq via ORAL
  Filled 2018-08-28 (×3): qty 1

## 2018-08-28 MED ORDER — METOPROLOL TARTRATE 25 MG PO TABS
25.0000 mg | ORAL_TABLET | Freq: Three times a day (TID) | ORAL | Status: DC
  Administered 2018-08-28 – 2018-08-30 (×7): 25 mg via ORAL
  Filled 2018-08-28 (×7): qty 1

## 2018-08-28 MED ORDER — ASPIRIN EC 81 MG PO TBEC
81.0000 mg | DELAYED_RELEASE_TABLET | Freq: Every day | ORAL | Status: DC
  Administered 2018-08-29 – 2018-08-30 (×2): 81 mg via ORAL
  Filled 2018-08-28 (×2): qty 1

## 2018-08-28 MED ORDER — POLYETHYLENE GLYCOL 3350 17 G PO PACK: 17.0000 g | PACK | Freq: Every day | ORAL | Status: DC | PRN

## 2018-08-28 MED ORDER — POTASSIUM CHLORIDE CRYS ER 20 MEQ PO TBCR
40.0000 meq | EXTENDED_RELEASE_TABLET | Freq: Once | ORAL | Status: AC
  Administered 2018-08-28: 40 meq via ORAL
  Filled 2018-08-28: qty 2

## 2018-08-28 MED ORDER — ENOXAPARIN SODIUM 40 MG/0.4ML ~~LOC~~ SOLN
40.0000 mg | SUBCUTANEOUS | Status: DC
  Administered 2018-08-28 – 2018-08-29 (×2): 40 mg via SUBCUTANEOUS
  Filled 2018-08-28 (×2): qty 0.4

## 2018-08-28 MED ORDER — TRAZODONE HCL 50 MG PO TABS
25.0000 mg | ORAL_TABLET | Freq: Every evening | ORAL | Status: DC | PRN
  Administered 2018-08-28: 50 mg via ORAL
  Filled 2018-08-28: qty 1

## 2018-08-28 MED ORDER — OLANZAPINE 5 MG PO TABS
5.0000 mg | ORAL_TABLET | Freq: Two times a day (BID) | ORAL | Status: DC | PRN
  Administered 2018-08-28: 5 mg via ORAL
  Filled 2018-08-28 (×3): qty 1

## 2018-08-28 MED ORDER — DIPHENHYDRAMINE HCL 12.5 MG/5ML PO ELIX: 12.5000 mg | ORAL_SOLUTION | Freq: Four times a day (QID) | ORAL | Status: DC | PRN

## 2018-08-28 MED ORDER — QUETIAPINE FUMARATE 50 MG PO TABS
50.0000 mg | ORAL_TABLET | Freq: Every day | ORAL | Status: DC
  Administered 2018-08-28 – 2018-08-29 (×2): 50 mg via ORAL
  Filled 2018-08-28 (×2): qty 1

## 2018-08-28 MED ORDER — INSULIN ASPART 100 UNIT/ML ~~LOC~~ SOLN: 0.0000 [IU] | Freq: Every day | SUBCUTANEOUS | Status: DC

## 2018-08-28 NOTE — H&P (Signed)
Please note correct date of H and P 08/28/2018 Physical Medicine and Rehabilitation Admission H&P     CC: Functional deficits post tumor resection.      HPI: Jason Farrell os a 78 year old male with history of CAD, T2DM, HTN, OSA--compliant with CPAP who was evaluated in the ED 07/21/18 with 3-4 week history of intermittent bouts of confusion with lability, headaches, difficulty speaking and visual changes.  MRI brain revealed large meningioma level of cerebral aqueduct with vasogenic edema of posterior medial temporal lobes and obstructive hydrocephalus.  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 [name]. Post procedure treated with decadron and IV Keppra X 7 days.  Drain removed with 1/30 and on 1/31 he developed decrease in LOC requiring transfer to ICU. 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  MRI brain showed small amount of hemorrhage at resection site with focal hemorrhage/clot within right ventricle and unchanged appearance of increased T2/Flair signal.  CTA head/neck done reveling narrow/diminshed flow in right V4 segment question due to dissection, thrombus or vasospasm.    He with resultant confusion with agitation and loss of bilateral vision. Neurology felt that patient's MS changes were felt to be due to delirium and did not feel seizures were cause -- d/c Keppra after 7 days.   Repeat CT head showed no change in small left parietal SDH with moderate R>L ventriculomegaly and no significant interval change.  Ophthalmology  (Dr. 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 To monitor over several weeks and to follow up with neuroophthalmologist in the future.  Left corneal abrasion treated with proparacaine and erythromycin ointment. ABLA being monitored,  hypocalcemia/hypokalemia treated with supplementation and leucocytosis felt to be reactive due to steriods.    Plans were to admit patient to CIR on 02/07 but he developed increase in confusion and lethargy. CBC showed rise in WBC to 30 and LP/BC done for work up. CSF/BC negative. Repeat CT head showed improvement in intraventricular blood and intraventricular air. Leucocytosis felt to be due to steroids.  Seroquel added at nights to help with sleep wake disruption and Zyprexa being used prn agitation during the day. He developed SVT with HR up to 150's on 2/11 and lopressor added with resolution.  On dysphagia 3 diet with question of aspiration --MBS not done due to cognitive deficits as well as body habitus. He continues to be confused, is oriented to self only, has visual deficits and responds to visual threat at times, continues to wax and wane with poor safety awarenss and impulsivity. Therapy ongoing and he is showing improvement with therapy therefore CIR recommended due to functional deficits.      Review of Systems  Unable to perform ROS: Mental acuity  Constitutional: Negative for fever.  HENT: Positive for hearing loss.   Eyes:       Denies visual deficits  Respiratory: Positive for cough. Negative for shortness of breath.   Cardiovascular: Negative for chest pain, palpitations and leg swelling.  Gastrointestinal: Negative for heartburn.  Musculoskeletal: Negative for myalgias.  Neurological: Positive for weakness and headaches.  Psychiatric/Behavioral: Positive for memory loss.            Past Medical History:  Diagnosis Date  . Coronary artery disease    . Diabetes mellitus without  complication ([place])    . Dyslipidemia    . GERD (gastroesophageal reflux disease)    . Hyperlipidemia    . Hypertension    . MI (myocardial infarction) ([place])    . Osteoarthritis             Past Surgical History:  Procedure Laterality Date  . CARDIAC CATHETERIZATION   2006    stent  placement  . COLONOSCOPY      . CORONARY ANGIOPLASTY WITH STENT PLACEMENT   2006    stent x 2   . CORONARY ANGIOPLASTY WITH STENT PLACEMENT   1996    stent x 3   . FOOT SURGERY Right    . HALLUX VALGUS CHEILECTOMY Right 02/25/2015    Procedure: HALLUX VALGUS CHEILECTOMY;  Surgeon: [name], DPM;  Location: [facility] ORS;  Service: Podiatry;  Laterality: Right;           Family History  Problem Relation Age of Onset  . Hyperlipidemia Father    . Hypertension Father    . Alcohol abuse Father    . Cancer Sister          lung  . Hypertension Sister    . Heart disease Sister    . Hyperlipidemia Sister    . COPD Sister        Social History:  Married. Retired IT trainer. Per  reports that he quit smoking about 24 years ago. His smoking use included cigarettes. He started smoking about 59 years ago. He has a 52.50 pack-year smoking history. He has never used smokeless tobacco. He reports current alcohol use of about 14.0 standard drinks of alcohol per week. He reports that he does not use drugs.      Allergies: No Known Allergies (Not in a hospital admission)     Drug Regimen Review  Drug regimen was reviewed and remains appropriate with no significant issues identified   Home:   Functional History: Was independent prior to a 6 weeks.    Functional Status:  Mobility: Max to mod assist with bed mobility. Very close SBA for balance at EOB due to impulsivity Min assist for sit to stand transfers Mod assist+2 to pivot from chair to bed Min assist to ambulate 41' with RW with multimodal cues       ADL: Min assist for grooming.  Requires hand over hand assist with mod cues to complete tasks and for feeding. Mod assist for dressing.       Cognition:     There were no vitals taken for this visit. Physical Exam  Nursing note and vitals reviewed. Constitutional: He appears well-developed and well-nourished.  Lying in bed with wet sounds--occasional coughing due to  oral secretions.   HENT:  Posterior crani incision C/D/I. Dry scabs on lips.   Respiratory:  Coarse upper airway sounds due to secretions.   Neurological: He is alert.  Oriented to self only. Needed cues for orientation to city/state.  Looks straight ahead with inability to see one foot in front of his eye or in periphery but has visual hallucinations --asking about "clothes on the floor".  Question right sided weakness? Apraxia?     General: No acute distress Mood and affect are appropriate Heart: Regular rate and rhythm no rubs murmurs or extra sounds Lungs: Clear to auscultation, breathing unlabored, no rales or wheezes Abdomen: Positive bowel sounds, soft nontender to palpation, nondistended Extremities: No clubbing, cyanosis, or edema Skin: No evidence of breakdown, no evidence of rash Neurologic:Right CN  6 palsy, motor strength is 5/5 in bilateral deltoid, bicep, tricep, grip, 4/5 bilateral hip flexor, knee extensors, ankle dorsiflexor and plantar flexor Sensory exam normal sensation to light touchin bilateral upper and lower extremities Cerebellar examdeferred due to vision Musculoskeletal: Full range of motion in all 4 extremities. No joint swelling   Recent labs.      Imaging Results (Last 48 hours)  No results found.           Medical Problem List and Plan: 1.  Decline in mobility and self care secondary to Meningioma with post operative IVH and SDH 2.  DVT Prophylaxis/Anticoagulation: Pharmaceutical: Lovenox 3. Pain Management: Tylenol prn 4. Mood: LCSW to follow for evaluation and support.  5. Neuropsych: This patient is not fully capable of making decisions on his own behalf. 6. Skin/Wound Care: Routine pressure relief measures.  7. Fluids/Electrolytes/Nutrition: Monitor I/O. Check lytes in am.  8. T2DM: Was on Januvia, metformin and glucotrol PTA--Monitor BS ac/hs. Will use SSI for elevated BS.  Monitor CBG    9. HTN: Monitor BP bid. Controlled without  medications at this time. --on  10.  SVT: Monitor HR bid--controlled on metoprolol 25 mg qid.  11.  OSA: Continue to encourage CPAP use.  12. CAD: Stable. Continue low dose ASA and BB.  13. Delirium/encephalopathy; Resolving. Monitor for worsening with change of environment. On Seroquel at nights.  Sleep wake chart to monitor sleep pattern. Frequent checks and Telesitter for safety.  14. Leucocytosis: Resolving.  In th past week from 30.1--> 19.3--> 9.2 today. Cultures/work up negative. Likely steroid related 15. Hypokalemia: Drop in K-3.4 today. Will add supplement. Due to recent SVT likely  need to keep K>/+ 4.0.  16.  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 Functional deficits secondary  to meningioma with postoperative IVH, SDH. 2. Patient admitted to receive collaborative, interdisciplinary care between the physiatrist, rehab nursing staff, and therapy team. 3. Patient's level of medical complexity and substantial therapy needs in context of that medical necessity cannot be provided at a lesser intensity of care. 4. Patient has experienced substantial functional loss from his/her baseline.  Judging by the patient's diagnosis, physical exam, and functional history, the patient has potential for functional progress which will result in measurable gains while on inpatient rehab.  These gains will be of substantial and practical use upon discharge in facilitating mobility and self-care at the household level. 5. Physiatrist will provide 24 hour management of medical needs as well as oversight of the therapy plan/treatment and provide guidance as appropriate regarding the interaction of the two. 6. 24 hour rehab nursing will assist in the management of  bladder management, bowel management, safety, skin/wound care, disease management, medication administration, pain management and patient education  and help integrate  therapy concepts, techniques,education, etc. 7. PT will assess and treat for.:pre gait, gait training, endurance , safety, equipment, neuromuscular re education  .  Goals are: supervision. 8. OT will assess and treat for   ADLs, Cognitive perceptual skills, Neuromuscular re education, safety, endurance, equipment.  Goals are: supervision.  9. SLP will assess and treat for memory, attention, problem-solving, thought organization  .  Goals are: supervision. 10. Case Management and Social Worker will assess and treat for psychological issues and discharge planning. 11. Team conference will be held weekly to assess progress toward goals and to determine barriers to discharge. 12.  Patient will receive at least 3 hours of therapy per day at least  5 days per week. 13. ELOS and Prognosis: 10 to 14 days good      "I have personally performed a face to face diagnostic evaluation of this patient.  Additionally, I have reviewed and concur with the physician assistant's documentation above." [name] M.D. [facility] FAAPM&R (Sports Med, Neuromuscular Med) Diplomate Am Board of Electrodiagnostic Med   Bary Leriche, PA-C 08/21/2018

## 2018-08-28 NOTE — PMR Pre-admission (Signed)
[facility] PMR Admission Coordinator Pre-Admission Assessment  Patient: Jason Farrell. is an 78 y.o., male MRN: [id number] DOB: 12/23/1940 Height:   Weight:    Insurance Information HMO:     PPO:      PCP:      IPA:      80/20:      OTHER: no HMO PRIMARY: Medicare a and b      Policy#: [id number]      Subscriber: pt Benefits:  Phone #: passport one online     Name: 08/21/2018 Eff. Date: 02/14/2008     Deduct: $1408      Out of Pocket Max: none      Life Max: none CIR: 100%      SNF: 20 full days Outpatient: 80%     Co-Pay: 20% Home Health: 100%      Co-Pay: none DME: 80%     Co-Pay: 20% Providers: pt choice  SECONDARY: [employer]      Policy#: [id number]   Medicaid Application Date:       Case Manager:  Disability Application Date:       Case Worker:   Emergency Contact Information Contact Information    Name Relation Home Work Mobile   Ferreras,[name] Spouse   [phone]      Current Medical History  Patient Admitting Diagnosis: Resection of pineal region mass  History of Present Illness: Patient presented on 08/11/2018 for evaluation after being seen in the ED 1/26 for headaches and symptoms of difficulty with speech, sleep disturbance and behavioral changes since onset of summer. Significant worsening over past 3 to 4 weeks. Patient wandering, emotionally labile and crying frequently. 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  EVD/subgaleal drain removed with post removal hemorrhage. Neurology consulted. CT demonstrated intra ventricular hemorrhage in the anterior horn of the right lateral ventricle.  CTA with concern for R V4 segment stenosis, continuing decadron taper and Keppra.  Postoperative hospital course noted with delirium with pt receiving Seroquel, sleep hygiene and tapering steroids.  Left eye corneal abrasion responded to proparacaine, fluorescein dye exam , as well as continued erythromycin ointment to the affected eye for 4 days.  Patient  developed confusion and bilateral loss of vision on POD 3. Patient had EVD pulled on 1/30 and developed intraventricular hemorrhage, confusion and bilateral vision loss afterwards. Vision grossly intact before. Ophthalmology consulted. 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 Felt no intervention needed. PRES felt may take several weeks to recover at which patients may have variable degree of visual recovery,. To follow up as an outpatient.   Patient was scheduled to admit to CIR last week, but he had increased confusion and altered mental status.  He was in mittens due to pulling out lines.  He is now pleasantly confused and out of mittens.  He is participating well with therapies and is now ready for a comprehensive inpatient rehab program.  Patient[facility] medical [facility] has been reviewed by the rehabilitation admission coordinator and physician.  Past Medical History  Past Medical History:  Diagnosis Date  . Coronary artery disease   . Diabetes mellitus without complication ([place])   . Dyslipidemia   . GERD (gastroesophageal reflux disease)   . Hyperlipidemia   . Hypertension   . MI (myocardial infarction) ([place])   . Osteoarthritis     Family History   family history includes Alcohol abuse in his father; COPD in his sister; Cancer in his sister; Heart disease in  his sister; Hyperlipidemia in his father and sister; Hypertension in his father and sister.  Prior Rehab/Hospitalizations Has the patient had major surgery during 100 days prior to admission? No   Current Medications  Current Meds since 08/11/2018  See MAR No current facility-administered medications for this encounter.   Current Outpatient Medications:  .  amLODipine (NORVASC) 10 MG tablet, Take 1 tablet (10 mg total) by mouth daily., Disp: 90 tablet, Rfl: 1 .  aspirin 81 MG tablet, Take 81 mg by mouth daily., Disp: , Rfl:  .  atorvastatin (LIPITOR) 40 MG tablet, Take 1  tablet (40 mg total) by mouth daily., Disp: 90 tablet, Rfl: 1 .  Coenzyme Q10 (CO Q10) 200 MG CAPS, Take 1 capsule by mouth daily., Disp: , Rfl:  .  glipiZIDE (GLUCOTROL) 5 MG tablet, Take 1 tablet (5 mg total) by mouth 2 (two) times daily before a meal., Disp: 180 tablet, Rfl: 0 .  glucose blood test strip, Use as instructed, Disp: 100 each, Rfl: 12 .  Influenza vac split quadrivalent PF (FLUZONE HIGH-DOSE) 0.5 ML injection, Fluzone High-Dose 2019-20 (PF) 180 mcg/0.5 mL intramuscular syringe  ADM 0.5ML IM UTD, Disp: , Rfl:  .  lisinopril-hydrochlorothiazide (ZESTORETIC) 20-12.5 MG tablet, Take 1 tablet by mouth 2 (two) times daily., Disp: 180 tablet, Rfl: 1 .  metFORMIN (GLUCOPHAGE-XR) 750 MG 24 hr tablet, Take 2 tablets (1,500 mg total) by mouth daily with breakfast., Disp: 180 tablet, Rfl: 1 .  metoprolol succinate (TOPROL-XL) 50 MG 24 hr tablet, Take 1 tablet (50 mg total) by mouth daily. Take with or immediately following a meal., Disp: 90 tablet, Rfl: 0 .  Multiple Vitamins-Minerals (CENTRUM SILVER PO), Take by mouth daily., Disp: , Rfl:  .  Omega-3 Fatty Acids (FISH OIL) 1200 MG CAPS, Take 1,200 mg by mouth daily., Disp: , Rfl:  .  omeprazole (PRILOSEC) 40 MG capsule, Take 1 capsule (40 mg total) by mouth daily., Disp: 90 capsule, Rfl: 1 .  sitaGLIPtin (JANUVIA) 100 MG tablet, Take 1 tablet (100 mg total) by mouth daily., Disp: 90 tablet, Rfl: 1 .  Vitamin D, Cholecalciferol, 1000 units TABS, Take 1,000 units of lipase by mouth daily. , Disp: , Rfl:   Patients Current Diet:   Diet Order    None      Precautions / Restrictions   Fall precautions Impulsive with decreased safety awareness  Has the patient had 2 or more falls or a fall with injury in the past year?No  Prior Activity Level    Declining memory since summer  Prior Functional Level Do you want   from other? Self Care: Did the patient need help bathing, dressing, using the toilet or eating?  Independent  Indoor  Mobility: Did the patient need assistance with walking from room to room (with or without device)? Independent  Stairs: Did the patient need assistance with internal or external stairs (with or without device)? Independent  Functional Cognition: Did the patient need help planning regular tasks such as shopping or remembering to take medications? Independent  Home Assistive Devices / Equipment    Prior Device Use: Indicate devices/aids used by the patient prior to current illness, exacerbation or injury? None of the above  Prior Functional Level Independent   Prior Functional Level Current Functional Level  Bed Mobility   Independent  mod assist with rail with raised HOB and 2 person assist. multiple verbal and tactile cues and increased time to complete.   Transfers Independent Min assist with RW and  2 person assist.  Mobility - Walk/Wheelchair Independent ; shuffling gait   impulsive, inconsistent in following directions, requires cuing; unable to accurately report objects held in visual field.  Mobility - Ambulation/Gait   shuffling gait   bed to chair; requires multimodal cuing 1 step command. 2/5 ambulated 30 feet with RW min assist of 1 and close chair follow. Verbal and tactile cues to leave RW on the ground when ambulating. Pushing RW too far in front of him during gait. Decreased step length and speed.   Upper Body Dressing independent   mod assist doff/don gown  Lower Body Dressing independent   toilet hygiene max assist; mod assist LB dressing  Grooming independent   standing at sink with mod assist to wash hands  Eating/Drinking independent Mod assist  Toilet Transfer independent Mod assist  Bladder Continence continent Had foley catheter  Bowel Management  continent    Stair Climbing independent Dependent  Communication intact    Memory Increasing confusion since summer   grossly functional oropharyngeal swallow; mod cognitive impairment. Self feeding difficulty due to  visuospatial deficits.  Functional 1 and 2 step command following. Oriented to self. impairments in attention, memory, delayed recall and executive problem solving.   Cooking/Meal Prep  independent     Housework  independent   Money Management  Wife assisted   Driving  independent     Special needs/care consideration BiPAP/CPAP yes CPAP pta CPM n/a Continuous Drip IV: n/a Dialysis n/a Life Vest n/a Oxygen n/a Special Bed n/a Trach Size n/a Wound Vac n/a Skin WOC consulted 08/12/2018. Face and chest skin breakdown. Patient underwent 11 hour surgery in prone position 08/11/2018. Purple ecchymotic area to right upper lip. Unable to determine if it is deep tissue injury or true ecchymosis, but appears to be latter. Anterior upper chest just above nipple line with 6 cm X 23 cm non blanchable erythema. No broken skin present. Skin marker used to outline erythema. Remainder of skin outside of surgical incisions unremarkable. No treatment required. recommended to continue to monitor chest erythema for evolution of wound. May result in serous filled blisters which would require treatment. WOC consulted 08/12/2018 . Scant serosanguinous drainage to occipital dressing; right frontal dressing dray.  Bowel mgmt: LBM 2/4 continent Bladder mgmt: able to tell RN when needs to void and able to use urinal; urgency noted Diabetic mgmt yes  Previous Home Environment    Discharge Living Setting    Social/Family/Support Systems    Goals/Additional Needs    Patient Condition: I have reviewed medical records from [facility], spoken with  CM, and spouse. I discussed via phone and reviewed faxed clinical information  for inpatient rehabilitation assessment.  Patient will benefit from ongoing PT, OT, and SLP, can actively participate in 3 hours of therapy a day 5 days of the week, and can make measurable gains during the admission.  Patient will also benefit from the coordinated team approach during an  Inpatient Acute Rehabilitation admission.  The patient will receive intensive therapy as well as Rehabilitation physician, nursing, social worker, and care management interventions.  Due to bowel management, bladder management, safety, skin/wound care, disease management, medical administration, pain management, patient education the patient requires 24 hour a day rehabilitation nursing.  The patient is currently min to mod assist with 1 step commands and cuing with mobility and basic ADLs.  Discharge setting and therapy post discharge at  home with home health is anticipated.  Patient has agreed to participate in the Acute  Inpatient Rehabilitation Program and will admit today  Preadmission Screen Completed By:  [name] RN MSN, 08/28/2018 10:38 AM ______________________________________________________________________   Discussed status with Dr. Letta Pate  on  08/28/2018 at 1025 and received telephone approval for admission today.  Admission Coordinator:  [name] RN MSN, time 1025/Date 08/28/2018   Assessment/Plan: Diagnosis:IVH 1. Does the need for close, 24 hr/day  Medical supervision in concert with the patient's rehab needs make it unreasonable for this patient to be served in a less intensive setting? Yes 2. Co-Morbidities requiring supervision/potential complications: HTN, PRES, Diabetes 3. Due to bladder management, bowel management, safety, skin/wound care, disease management, medication administration, pain management and patient education, does the patient require 24 hr/day rehab nursing? Yes 4. Does the patient require coordinated care of a physician, rehab nurse, PT (1-2 hrs/day, 5 days/week), OT (1-2 hrs/day, 5 days/week) and SLP (.5-1 hrs/day, 5 days/week) to address physical and functional deficits in the context of the above medical diagnosis(es)? Yes Addressing deficits in the following areas: balance, endurance, locomotion, strength, transferring, bowel/bladder  control, bathing, dressing, feeding, grooming, toileting, cognition, speech, language, swallowing and psychosocial support 5. Can the patient actively participate in an intensive therapy program of at least 3 hrs of therapy 5 days a week? Yes 6. The potential for patient to make measurable gains while on inpatient rehab is good 7. Anticipated functional outcomes upon discharge from inpatients are: modified independent PT, modified independent OT, modified independent SLP 8. Estimated rehab length of stay to reach the above functional goals is: 10-14d 9. Anticipated D/C setting: Home 10. Anticipated post D/C treatments: Reedy therapy 11. Overall Rehab/Functional Prognosis: good     Karene Fry, RN MSN [name] MD 08/28/2018

## 2018-08-28 NOTE — Progress Notes (Signed)
Patient ID: Jason Farrell., male   DOB: [birth year], 78 y.o.   MRN: [id number] Patient arrived from [facility] via Ambulance at approximately 1315. Patient oriented to rehab schedule, rehab process, soft touch call bell, fall prevention plan, health resource notebook, and rehab safety plan. Patient resting comfortably in bed with bed alarm on and family in room with patient.

## 2018-08-28 NOTE — Progress Notes (Signed)
Called into room by NT because patient is up on his knees trying to get out of bed.  Patient presents with confusion, agitation, and aggression. Patient trying to get out of bed with Telesitter in place. Patient given PRN dose of Zyprexa 5mg . Patient brought to nurses station. Will continue to monitor patient for agitated behaviors.

## 2018-08-28 NOTE — Progress Notes (Signed)
Jason Farrell -spouse notified with updates regarding Enclosure Bed and current status, Patient is calm and relaxing at this time, Tele-sitter in place,

## 2018-08-28 NOTE — Progress Notes (Signed)
Order received for Enclosure bed by CN on day shift, patient remains at nursing station and agitated, numerous episodes of confusion yelling out able to calm for brief periods 750p Patient placed in Enclosure bed without difficulties, Wife to be notified,. Tele-sitter  monitoring in place, encourage relaxation techniques, call bell within reach

## 2018-08-29 ENCOUNTER — Inpatient Hospital Stay (HOSPITAL_COMMUNITY): Payer: Managed Care, Other (non HMO) | Admitting: Physical Therapy

## 2018-08-29 ENCOUNTER — Inpatient Hospital Stay (HOSPITAL_COMMUNITY): Payer: Managed Care, Other (non HMO) | Admitting: Occupational Therapy

## 2018-08-29 ENCOUNTER — Inpatient Hospital Stay (HOSPITAL_COMMUNITY): Payer: Managed Care, Other (non HMO) | Admitting: Speech Pathology

## 2018-08-29 DIAGNOSIS — G9340 Encephalopathy, unspecified: Secondary | ICD-10-CM

## 2018-08-29 DIAGNOSIS — E876 Hypokalemia: Secondary | ICD-10-CM

## 2018-08-29 DIAGNOSIS — I471 Supraventricular tachycardia: Secondary | ICD-10-CM

## 2018-08-29 DIAGNOSIS — E46 Unspecified protein-calorie malnutrition: Secondary | ICD-10-CM

## 2018-08-29 DIAGNOSIS — D72829 Elevated white blood cell count, unspecified: Secondary | ICD-10-CM

## 2018-08-29 DIAGNOSIS — E1169 Type 2 diabetes mellitus with other specified complication: Secondary | ICD-10-CM

## 2018-08-29 DIAGNOSIS — E669 Obesity, unspecified: Secondary | ICD-10-CM

## 2018-08-29 DIAGNOSIS — D62 Acute posthemorrhagic anemia: Secondary | ICD-10-CM

## 2018-08-29 DIAGNOSIS — I10 Essential (primary) hypertension: Secondary | ICD-10-CM

## 2018-08-29 LAB — COMPREHENSIVE METABOLIC PANEL
ALT: 34 U/L (ref 0–44)
AST: 21 U/L (ref 15–41)
Albumin: 2.4 g/dL — ABNORMAL LOW (ref 3.5–5.0)
Alkaline Phosphatase: 68 U/L (ref 38–126)
Anion gap: 11 (ref 5–15)
BUN: 8 mg/dL (ref 8–23)
CO2: 23 mmol/L (ref 22–32)
Calcium: 8.6 mg/dL — ABNORMAL LOW (ref 8.9–10.3)
Chloride: 106 mmol/L (ref 98–111)
Creatinine, Ser: 0.69 mg/dL (ref 0.61–1.24)
GFR calc Af Amer: >60 mL/min (ref >60)
GFR calc non Af Amer: >60 mL/min (ref >60)
Glucose, Bld: 184 mg/dL — ABNORMAL HIGH (ref 70–99)
Potassium: 4 mmol/L (ref 3.5–5.1)
Sodium: 140 mmol/L (ref 135–145)
Total Bilirubin: 0.7 mg/dL (ref 0.3–1.2)
Total Protein: 5.4 g/dL — ABNORMAL LOW (ref 6.5–8.1)

## 2018-08-29 LAB — CBC WITH DIFFERENTIAL/PLATELET
Abs Immature Granulocytes: 0.08 10*3/uL — ABNORMAL HIGH (ref 0.00–0.07)
Basophils Absolute: 0 10*3/uL (ref 0.0–0.1)
Basophils Relative: 0 %
Eosinophils Absolute: 0.1 10*3/uL (ref 0.0–0.5)
Eosinophils Relative: 1 %
HEMATOCRIT: 36.1 % — AB (ref 39.0–52.0)
Hemoglobin: 12 g/dL — ABNORMAL LOW (ref 13.0–17.0)
Immature Granulocytes: 1 %
Lymphocytes Relative: 10 %
Lymphs Abs: 1.1 10*3/uL (ref 0.7–4.0)
MCH: 29.6 pg (ref 26.0–34.0)
MCHC: 33.2 g/dL (ref 30.0–36.0)
MCV: 88.9 fL (ref 80.0–100.0)
Monocytes Absolute: 0.7 10*3/uL (ref 0.1–1.0)
Monocytes Relative: 7 %
Neutro Abs: 9 10*3/uL — ABNORMAL HIGH (ref 1.7–7.7)
Neutrophils Relative %: 81 %
Platelets: 298 10*3/uL (ref 150–400)
RBC: 4.06 MIL/uL — ABNORMAL LOW (ref 4.22–5.81)
RDW: 14.3 % (ref 11.5–15.5)
WBC: 11 10*3/uL — ABNORMAL HIGH (ref 4.0–10.5)
nRBC: 0 % (ref 0.0–0.2)

## 2018-08-29 LAB — GLUCOSE, CAPILLARY
GLUCOSE-CAPILLARY: 227 mg/dL — AB (ref 70–99)
Glucose-Capillary: 158 mg/dL — ABNORMAL HIGH (ref 70–99)
Glucose-Capillary: 182 mg/dL — ABNORMAL HIGH (ref 70–99)
Glucose-Capillary: 166 mg/dL — ABNORMAL HIGH (ref 70–99)

## 2018-08-29 MED ORDER — PRO-STAT SUGAR FREE PO LIQD
30.0000 mL | Freq: Two times a day (BID) | ORAL | Status: DC
  Administered 2018-08-29 – 2018-08-30 (×3): 30 mL via ORAL
  Filled 2018-08-29 (×3): qty 30

## 2018-08-29 MED ORDER — GLUCERNA SHAKE PO LIQD
237.0000 mL | Freq: Two times a day (BID) | ORAL | Status: DC
  Administered 2018-08-29: 237 mL via ORAL

## 2018-08-29 NOTE — Evaluation (Signed)
Occupational Therapy Assessment and Plan  Patient Details  Name: Jason Farrell. MRN: [id number] Date of Birth: 12/06/1940   OT Diagnosis: apraxia, ataxia, cognitive deficits and muscle weakness (generalized) Rehab Potential: Rehab Potential (ACUTE ONLY): Good ELOS: 21-24 days   Today's Date: 08/29/2018 OT Individual Time: 7616-0737 OT Individual Time Calculation (min): 55 min     Problem List:  Patient Active Problem List   Diagnosis Date Noted  . Acute blood loss anemia   . Hypoalbuminemia due to protein-calorie malnutrition ([place])   . Hypokalemia   . Leukocytosis   . Encephalopathy   . SVT (supraventricular tachycardia) ([place])   . 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 Debility   . Meningioma ([place])   . 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 Senile purpura ([place]) 02/21/2018  . Type 2 diabetes mellitus with microalbuminuria, without long-term current use of insulin ([place]) 02/21/2018  . Controlled type 2 diabetes mellitus without complication, without long-term current use of insulin ([place]) 11/11/2017  . 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 Obstructive sleep apnea on CPAP 12/09/2015  . Acid reflux 05/06/2015  . Arthritis, degenerative 05/06/2015  . Atherosclerosis of native coronary artery of native heart with stable angina pectoris ([place]) 01/25/2015  . Diabetes mellitus type 2 in obese ([place]) 08/16/2014  . Hyperlipidemia 08/16/2014  . Essential hypertension 08/16/2014  . Morbid obesity ([place]) 08/16/2014  . Stopped smoking with greater than 30 pack year history 08/16/2014  . COLD (chronic obstructive lung disease) ([place]) 08/16/2014    Past Medical History:  Past Medical History:  Diagnosis Date  . Coronary artery disease   . Diabetes mellitus without complication ([place])   . Dyslipidemia   . GERD (gastroesophageal reflux disease)   . Hyperlipidemia   . Hypertension   . MI (myocardial infarction) ([place])   . Osteoarthritis    Past Surgical History:   Past Surgical History:  Procedure Laterality Date  . CARDIAC CATHETERIZATION  2006   stent placement  . COLONOSCOPY    . CORONARY ANGIOPLASTY WITH STENT PLACEMENT  2006   stent x 2   . CORONARY ANGIOPLASTY WITH STENT PLACEMENT  1996   stent x 3   . FOOT SURGERY Right   . HALLUX VALGUS CHEILECTOMY Right 02/25/2015   Procedure: HALLUX VALGUS CHEILECTOMY;  Surgeon: [name], DPM;  Location: [facility] ORS;  Service: Podiatry;  Laterality: Right;    Assessment & Plan Clinical Impression: Patient is a 78 y.o. year old male with recent admission to the hospital on 08/11/2018 for evaluation after being seen in the ED 1/26 for headaches and symptoms of difficulty with speech, sleep disturbance and behavioral changes since onset of summer. Significant worsening over past 3 to 4 weeks. Patient wandering, emotionally labile and crying frequently. 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 Patient transferred to CIR on 08/28/2018 .    Patient currently requires max with basic self-care skills secondary to muscle weakness, impaired timing and sequencing, motor apraxia, ataxia, decreased coordination and decreased motor planning, decreased visual perceptual skills, decreased midline orientation, decreased motor planning and ideational apraxia, decreased initiation, decreased attention, decreased awareness, decreased problem solving, decreased safety awareness, decreased memory and delayed processing and decreased sitting balance, decreased standing balance, decreased postural control and decreased balance strategies.  Prior to hospitalization, patient could complete bADL with independent .  Patient will benefit from skilled intervention to increase independence with basic self-care skills prior to discharge home with care partner.  Anticipate patient will require 24 hour supervision and follow  up home health.  OT - End of Session Endurance Deficit: Yes Endurance Deficit Description: multiple rest  breaks within BADL tasks OT Assessment Rehab Potential (ACUTE ONLY): Good OT Patient demonstrates impairments in the following area(s): Balance;Behavior;Cognition;Endurance;Motor;Perception;Safety;Sensory;Vision OT Basic ADL's Functional Problem(s): Eating;Grooming;Bathing;Dressing;Toileting OT Transfers Functional Problem(s): Tub/Shower;Toilet OT Additional Impairment(s): None OT Plan OT Intensity: Minimum of 1-2 x/day, 45 to 90 minutes OT Frequency: 5 out of 7 days OT Duration/Estimated Length of Stay: 21-24 days OT Treatment/Interventions: Balance/vestibular training;Cognitive remediation/compensation;Community reintegration;Discharge planning;DME/adaptive equipment instruction;Functional mobility training;Neuromuscular re-education;Patient/family education;Psychosocial support;Self Care/advanced ADL retraining;Therapeutic Activities;Therapeutic Exercise;UE/LE Strength taining/ROM;UE/LE Coordination activities;Visual/perceptual remediation/compensation;Wheelchair propulsion/positioning OT Self Feeding Anticipated Outcome(s): Supervision OT Basic Self-Care Anticipated Outcome(s): Supervision OT Toileting Anticipated Outcome(s): Supervision OT Bathroom Transfers Anticipated Outcome(s): Supervision OT Recommendation Recommendations for Other Services: Neuropsych consult Patient destination: Home Follow Up Recommendations: Home health OT Equipment Recommended: To be determined   Skilled Therapeutic Intervention Pt greeted seated in wc handoff from SLP. OT assisted pt with self-feeding activity with pt dependent for taking bites of cereal 2/2 apraxia using B hands, visual deficits, and cognitive deficits. Hand over hand A to grasp cup with R hand and guided A to bring to mouth to take sips of coffee. Returned pt to room for bathing/dressing activities. Throughout session, pt reaching arms towards things that are not there. Pt also states his father is in the room, says hello to his father  often, and having nonsensical conversations. Pt pleasant throughout session despite confusion. Pt unable to locate wash cloth placed in front of pt and needed hand over hand A to initiate UB bathing, then pt able to finish washing arms. Sit<>stand at the sink with mod A and max verbal cues to initiate stand. Max A for UB and LB dressing 2/2 difficulty processing commands and motor planning. OT tried to engage pt in toothbrushing task at the sink. Pt unable to locate water coming out of faucet at midline and when handed toothbrush, did not know what to do with toothbrush. Pt lethargic sitting in wc and scooting bottom to the edge, so OT transferred pt back to bed with mod A of 1. Pt left in enclosure bed with needs met.    OT Evaluation Precautions/Restrictions  Precautions Precautions: Fall Restrictions Weight Bearing Restrictions: No Pain Pain Assessment Pain Score: 0-No pain Home Living/Prior Functioning Home Living Family/patient expects to be discharged to:: Private residence Living Arrangements: Spouse/significant other Available Help at Discharge: Family, Available 24 hours/day Type of Home: House Home Access: Stairs to enter [employer] of Steps: 3 Entrance Stairs-Rails: Right, Left, Can reach both Home Layout: Two level, Able to live on main level with bedroom/bathroom Alternate Level Stairs-Number of Steps: 3 steps, landing and then 12 steps Alternate Level Stairs-Rails: Left Bathroom Shower/Tub: Tub/shower unit, Curtain(downstairs bathroom) Bathroom Toilet: Standard Bathroom Accessibility: Yes Additional Comments: can set up bedroom downstairs  Lives With: Spouse Prior Function Level of Independence: Independent with basic ADLs, Independent with homemaking with ambulation ADL ADL Eating: Dependent Grooming: Dependent Upper Body Bathing: Moderate assistance, Moderate cueing Where Assessed-Upper Body Bathing: Wheelchair, Sitting at sink Lower Body Bathing:  Maximal assistance Where Assessed-Lower Body Bathing: Sitting at sink, Wheelchair Upper Body Dressing: Maximal assistance, Maximal cueing Where Assessed-Upper Body Dressing: Wheelchair Lower Body Dressing: Dependent, Maximal cueing Where Assessed-Lower Body Dressing: Wheelchair Toileting: Dependent Toilet Transfer: Moderate assistance, Maximal verbal cueing Toilet Transfer Method: Stand pivot Vision Baseline Vision/History: Wears glasses Wears Glasses: Reading only Vision Assessment?: Yes Eye Alignment: Impaired (comment) Ocular Range of Motion: Impaired-to be  further tested in functional context(unable to follow commands for formal vision assessment) Tracking/Visual Pursuits: Right eye does not track laterally;Left eye does not track laterally Depth Perception: Undershoots Additional Comments: Peripheral vision loss- difficulty to formally assess 2/2 cognitive deficits and difficulty following commands Perception  Perception: Impaired Inattention/Neglect: Impaired-to be further tested in functional context Praxis Praxis: Impaired Praxis Impairment Details: Ideation;Initiation;Ideomotor;Motor planning;Perseveration Cognition Overall Cognitive Status: Impaired/Different from baseline Arousal/Alertness: Lethargic Orientation Level: Person;Situation Year: Other (Comment)(1996) Month: May Day of Week: Incorrect Memory: Impaired Immediate Memory Recall: Bed;Blue;Sock Memory Recall: (none) Attention: Focused Focused Attention: Impaired Awareness: Impaired Problem Solving: Impaired Behaviors: Impulsive;Perseveration Safety/Judgment: Impaired Sensation Sensation Additional Comments: UTA 2/2 cognition Coordination Gross Motor Movements are Fluid and Coordinated: No Fine Motor Movements are Fluid and Coordinated: No Coordination and Movement Description: ataxia, decreased smoothness and accuracy of fine motor and gross motor movements Motor  Motor Motor: Ataxia;Motor  apraxia Balance Balance Balance Assessed: Yes Static Sitting Balance Static Sitting - Balance Support: Feet supported Static Sitting - Level of Assistance: 5: Stand by assistance Dynamic Sitting Balance Dynamic Sitting - Balance Support: Feet supported Dynamic Sitting - Level of Assistance: 4: Min assist Static Standing Balance Static Standing - Balance Support: During functional activity Static Standing - Level of Assistance: 4: Min assist Dynamic Standing Balance Dynamic Standing - Balance Support: During functional activity Dynamic Standing - Level of Assistance: 4: Min assist Extremity/Trunk Assessment RUE Assessment General Strength Comments: difficulty formally testing 2/2 cognitive deficits- grossly WFL LUE Assessment General Strength Comments: difficulty formally testing 2/2 cognitive deficits- grossly WFL  Refer to Care Plan for Long Term Goals  Recommendations for other services: Neuropsych  Discharge Criteria: Patient will be discharged from OT if patient refuses treatment 3 consecutive times without medical reason, if treatment goals not met, if there is a change in medical status, if patient makes no progress towards goals or if patient is discharged from hospital.  The above assessment, treatment plan, treatment alternatives and goals were discussed and mutually agreed upon: by patient  Valma Cava 08/29/2018, 9:52 AM

## 2018-08-29 NOTE — Progress Notes (Signed)
Patient closely monitor via policy and procedure with documentation, Tele sitter in place, consuming po fluids, agitation subsiding. calm and allowing staff to preform ADL care, Answering questions appropriately. Closely monitor, Enclosure in place

## 2018-08-29 NOTE — Progress Notes (Signed)
Social Work  Social Work Assessment and Plan  Patient Details  Name: Jason Farrell. MRN: [id number] Date of Birth: 1941-03-16  Today's Date: 08/29/2018  Problem List:  Patient Active Problem List   Diagnosis Date Noted  . PRES (posterior reversible encephalopathy syndrome) 08/30/2018  . Acute blood loss anemia   . Hypoalbuminemia due to protein-calorie malnutrition ([place])   . Hypokalemia   . Leukocytosis   . Encephalopathy   . SVT (supraventricular tachycardia) ([place])   . 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 Debility   . Meningioma ([place])   . 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 Senile purpura ([place]) 02/21/2018  . Type 2 diabetes mellitus with microalbuminuria, without long-term current use of insulin ([place]) 02/21/2018  . Controlled type 2 diabetes mellitus without complication, without long-term current use of insulin ([place]) 11/11/2017  . 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 Obstructive sleep apnea on CPAP 12/09/2015  . Acid reflux 05/06/2015  . Arthritis, degenerative 05/06/2015  . CAD in native artery 01/25/2015  . Diabetes mellitus type 2 in obese ([place]) 08/16/2014  . Hyperlipidemia 08/16/2014  . Essential hypertension 08/16/2014  . Morbid obesity ([place]) 08/16/2014  . Stopped smoking with greater than 30 pack year history 08/16/2014  . COLD (chronic obstructive lung disease) ([place]) 08/16/2014   Past Medical History:  Past Medical History:  Diagnosis Date  . Coronary artery disease   . Diabetes mellitus without complication ([place])   . Dyslipidemia   . GERD (gastroesophageal reflux disease)   . Hyperlipidemia   . Hypertension   . MI (myocardial infarction) ([place])   . Osteoarthritis    Past Surgical History:  Past Surgical History:  Procedure Laterality Date  . CARDIAC CATHETERIZATION  2006   stent placement  . COLONOSCOPY    . CORONARY ANGIOPLASTY WITH STENT PLACEMENT  2006   stent x 2   . CORONARY ANGIOPLASTY WITH STENT PLACEMENT  1996    stent x 3   . FOOT SURGERY Right   . HALLUX VALGUS CHEILECTOMY Right 02/25/2015   Procedure: HALLUX VALGUS CHEILECTOMY;  Surgeon: [name], DPM;  Location: [facility] ORS;  Service: Podiatry;  Laterality: Right;   Social History:  reports that he quit smoking about 24 years ago. His smoking use included cigarettes. He started smoking about 59 years ago. He has a 52.50 pack-year smoking history. He has never used smokeless tobacco. He reports current alcohol use of about 14.0 standard drinks of alcohol per week. He reports that he does not use drugs.  Family / Support Systems Marital Status: Married Spouse/Significant Other: (P) wife, Ravi Tuccillo @ (C) [phone] Children: (P) daughter, Elie Confer and son, Jenny Reichmann living in [place];  2 sons living in [place] Anticipated Caregiver: (P) wife Ability/Limitations of Caregiver: (P) no limitations Caregiver Availability: (P) 24/7 Family Dynamics: (P) Wife and daughter assisting with completion of assessment and very attentive and encouraging to pt.  Appear devoted to providing any support he may need. They are clearly a calming voice for him.  Social History Preferred language: English Religion: Unknown Cultural Background: (P) NA Read: (P) Yes Write: (P) Yes Employment Status: (P) Retired Public relations account executive Issues: (P) None Guardian/Conservator: (P) None - per MD, pt is not capable of making decisions on his own behalf - defer to spouse.   Abuse/Neglect Abuse/Neglect Assessment Can Be Completed: (P) Unable to assess, patient is non-responsive or altered mental status Physical Abuse: Denies Verbal Abuse: Denies Sexual Abuse: Denies Exploitation of patient/patient's resources: Denies Self-Neglect: Denies  Emotional Status Pt's affect, behavior and adjustment status: Pt in enclosure bed and with significant confusion.  Pt does not appear to be in any emotional distress.  Will monitor throughout stay as cognition  (hopefully) clears. Recent Psychosocial Issues: None Psychiatric History: None Substance Abuse History: None  Patient / Family Perceptions, Expectations & Goals Pt/Family understanding of illness & functional limitations: Pt's wife and daughter with very good understanding of the medical course pt has been through and of his current cognitive and functional deficits/ need for CIR. Premorbid pt/family roles/activities: Pt was independent overall until this past summer with mobility began to decline. Anticipated changes in roles/activities/participation: Wife fully prepared to continue as primary caregiver. Pt/family expectations/goals: "I just hope this confusion improves!" per wife  [employer]: None Premorbid Home Care/DME Agencies: None Transportation available at discharge: yes Resource referrals recommended: Neuropsychology, Support group (specify)  Discharge Planning Living Arrangements: Spouse/significant other Support Systems: Spouse/significant other, Children Type of Residence: Private residence Insurance Resources: [employer], Multimedia programmer (specify)([employer]) Museum/gallery curator Resources: Radio broadcast assistant Screen Referred: No Living Expenses: Medical laboratory scientific officer Management: Spouse, Patient Does the patient have any problems obtaining your medications?: No Home Management: Mostly pt's wife Patient/Family Preliminary Plans: Pt to d/c home with wife able to provide 24/7 support. Expected length of stay: ELOS 10 to 14 days  Clinical Impression Significantly cognitively impaired gentleman transferring to CIR from [facility] following tumor resection.  Currently in enclosure bed due to cognition/ safety concerns with wife and daughter at bedside and very involved and supportive.  Wife does feel that his cognition is slightly improved.  Will continue to follow for support and d/c planning needs.  Will involve neuropsychology when appropriate.  Tandra Rosado,  Vencil Basnett 08/29/2018, 3:10 PM

## 2018-08-29 NOTE — Progress Notes (Signed)
Speech Language Pathology Daily Session Note  Patient Details  Name: Brandy Kabat. MRN: [id number] Date of Birth: [birth year]  Today's Date: 08/29/2018 SLP Individual Time: 1025-1055 SLP Individual Time Calculation (min): 30 min  Short Term Goals: Week 1: SLP Short Term Goal 1 (Week 1): Patient will demonstrate sustaind attention to tasks for ~5 minutes with Mod A verbal cus for redirection.  SLP Short Term Goal 2 (Week 1): Patient will orient to place, month and situation with Max A multimodal cues.  SLP Short Term Goal 3 (Week 1): Patient will verbalize 2 physical/cognitive changes since hospitalization with Max A multomodal cues.  SLP Short Term Goal 4 (Week 1): Patient will demonstrate functional problem solving during very basic ADL tasks with Max A multimodal cues.  SLP Short Term Goal 5 (Week 1): Patient will consume current diet with minimal overt s/s of aspiration with supervision verbal cues.  SLP Short Term Goal 6 (Week 1): Patient will demonstrate efficient mastication and complete oral clearance with trials of regular textures over 2 sessions with supervision verbal cues prior to upgrade.   Skilled Therapeutic Interventions: Skilled treatment session focused on cognitive goals. Upon arrival, patient was asleep in enclosure bed and required Max verbal cues for arousal and Max tactile cues to sit EOB. With extra time, patient eventually transferred to the wheelchair. SLP facilitated session by providing Total-Max A verbal and tactile cues for functional problem solving during basic grooming tasks (washing face and brushing teeth). SLP also provided total A for orientation to place and month. Patient disoriented to time throughout session and perseverative on eating lunch because he did not eat breakfast (which he did with this SLP). Patient left at RN station with alarm on. Continue with current plan of care.      Pain Pain Assessment Pain Score: Asleep  Therapy/Group:  Individual Therapy  Shantella Blubaugh 08/29/2018, 11:45 AM

## 2018-08-29 NOTE — Evaluation (Signed)
Speech Language Pathology Assessment and Plan  Patient Details  Name: Jason Farrell. MRN: [id number] Date of Birth: 1940/10/13  SLP Diagnosis: Cognitive Impairments;Dysphagia  Rehab Potential: Fair ELOS: 21-24 days     Today's Date: 08/29/2018 SLP Individual Time: 7026-3785 SLP Individual Time Calculation (min): 55 min   Problem List:  Patient Active Problem List   Diagnosis Date Noted  . Acute blood loss anemia   . Hypoalbuminemia due to protein-calorie malnutrition ([place])   . Hypokalemia   . Leukocytosis   . Encephalopathy   . SVT (supraventricular tachycardia) ([place])   . 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 Debility   . Meningioma ([place])   . 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 Senile purpura ([place]) 02/21/2018  . Type 2 diabetes mellitus with microalbuminuria, without long-term current use of insulin ([place]) 02/21/2018  . Controlled type 2 diabetes mellitus without complication, without long-term current use of insulin ([place]) 11/11/2017  . 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 Obstructive sleep apnea on CPAP 12/09/2015  . Acid reflux 05/06/2015  . Arthritis, degenerative 05/06/2015  . Atherosclerosis of native coronary artery of native heart with stable angina pectoris ([place]) 01/25/2015  . Diabetes mellitus type 2 in obese ([place]) 08/16/2014  . Hyperlipidemia 08/16/2014  . Essential hypertension 08/16/2014  . Morbid obesity ([place]) 08/16/2014  . Stopped smoking with greater than 30 pack year history 08/16/2014  . COLD (chronic obstructive lung disease) ([place]) 08/16/2014   Past Medical History:  Past Medical History:  Diagnosis Date  . Coronary artery disease   . Diabetes mellitus without complication ([place])   . Dyslipidemia   . GERD (gastroesophageal reflux disease)   . Hyperlipidemia   . Hypertension   . MI (myocardial infarction) ([place])   . Osteoarthritis    Past Surgical History:  Past Surgical History:  Procedure Laterality Date  .  CARDIAC CATHETERIZATION  2006   stent placement  . COLONOSCOPY    . CORONARY ANGIOPLASTY WITH STENT PLACEMENT  2006   stent x 2   . CORONARY ANGIOPLASTY WITH STENT PLACEMENT  1996   stent x 3   . FOOT SURGERY Right   . HALLUX VALGUS CHEILECTOMY Right 02/25/2015   Procedure: HALLUX VALGUS CHEILECTOMY;  Surgeon: [name], DPM;  Location: [facility] ORS;  Service: Podiatry;  Laterality: Right;    Assessment / Plan / Recommendation Clinical Impression Patient is a 78 year old male with history of CAD, T2DM, HTN, OSA--compliant with CPAP who was evaluated in the ED 07/21/18 with 3-4 week history of intermittent bouts of confusion with lability, headaches, difficulty speaking and visual changes.  MRI brain revealed large meningioma level of cerebral aqueduct with vasogenic edema of posterior medial temporal lobes and obstructive hydrocephalus.  He was referred to [facility] for evaluation and underwent Craniotomy with resection of pineal region mass and placement of EVD on 08/11/2017 by  Dr. [name]. Post procedure treated with decadron and IV Keppra X 7 days.  Drain removed with 1/30 and on 1/31 he developed decrease in LOC requiring transfer to ICU. 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 MRI brain showed small amount of hemorrhage at resection site with focal hemorrhage/clot within right ventricle and unchanged appearance of increased T2/Flair signal.  CTA head/neck done reveling narrow/diminshed flow in right V4 segment question due to dissection, thrombus or vasospasm. Patient also with resultant confusion with agitation and loss of bilateral vision. Neurology felt that patient's MS changes were felt to be due to  delirium and did not feel seizures were cause -- d/c Keppra after 7 days.   Repeat CT head showed no change in small left parietal SDH with moderate R>L ventriculomegaly and no significant interval change.  Ophthalmology  (Dr. [name]) consulted and felt that visual  loss cortical in nature due to lack of ophthalmologic finding --likely due to PRES over PION. To monitor over several weeks and to follow up with neuroophthalmologist in the future.  Left corneal abrasion treated with proparacaine and erythromycin ointment. ABLA being monitored, hypocalcemia/hypokalemia treated with supplementation and leucocytosis felt to be reactive due to steriods. Plans were to admit patient to CIR on 02/07 but he developed increase in confusion and lethargy. CBC showed rise in WBC to 30 and LP/BC done for work up. CSF/BC negative. Repeat CT head showed improvement in intraventricular blood and intraventricular air. Leucocytosis felt to be due to steroids.  Seroquel added at nights to help with sleep wake disruption and Zyprexa being used prn agitation during the day. He developed SVT with HR up to 150's on 2/11 and lopressor added with resolution.  On dysphagia 3 diet with question of aspiration --MBS not done due to cognitive deficits as well as body habitus. He continues to be confused, is oriented to self only, has visual deficits and responds to visual threat at times, continues to wax and wane with poor safety awarenss and impulsivity. Therapy ongoing and he is showing improvement with therapy therefore CIR recommended due to functional deficits. Patient admitted 08/28/18.  Patient demonstrates severe cognitive impairments characterized by impaired orientation, focused attention, functional problem solving, recall of functional information and  intellectual awareness of deficits which impacts his safety with functional and familiar tasks. Function is also impacted by severe visual deficits and overall generalized confusion with patient consistently reaching for things that are present and with intermittent language of confusion noted throughout evaluation.  Patient consumed thin liquids via straw without overt s/s of aspiration but intermittent oral holding noted. Patient also  demonstrated prolonged mastication with mild oral residue with solid textures that cleared with cued liquid washes. Recommend patient continue current diet of Dys. 3 textures with thin liquids. Patient would benefit from skilled SLP intervention in order to maximize his swallowing and cognitive function and overall functional independence prior to dishcarge.    Skilled Therapeutic Interventions          Administered a cognitive-linguistic evaluation and BSE, please see above for details. Educated patient in regards to his current cognitive and swallowing impairments and goals of skilled SLP intervention. He verbalized understanding but will need reinforcement.   SLP Assessment  Patient will need skilled Speech Lanaguage Pathology Services during CIR admission    Recommendations  SLP Diet Recommendations: Dysphagia 3 (Mech soft);Thin Liquid Administration via: Straw Medication Administration: Whole meds with puree Supervision: Full supervision/cueing for compensatory strategies;Staff to assist with self feeding Compensations: Minimize environmental distractions;Slow rate;Small sips/bites;Follow solids with liquid Postural Changes and/or Swallow Maneuvers: Seated upright 90 degrees;Out of bed for meals Oral Care Recommendations: Oral care BID;Oral care before and after PO Patient destination: Home Follow up Recommendations: 24 hour supervision/assistance;Home Health SLP Equipment Recommended: None recommended by SLP    SLP Frequency 3 to 5 out of 7 days   SLP Duration  SLP Intensity  SLP Treatment/Interventions 21-24 days   Minumum of 1-2 x/day, 30 to 90 minutes  Cognitive remediation/compensation;Environmental controls;Internal/external aids;Therapeutic Activities;Patient/family education;Functional tasks;Dysphagia/aspiration precaution training;Cueing hierarchy    Pain Pain Assessment Pain Score: Asleep  Prior  Functioning Type of Home: House  Lives With: Spouse Available Help at  Discharge: Family;Available 24 hours/day  Short Term Goals: Week 1: SLP Short Term Goal 1 (Week 1): Patient will demonstrate sustaind attention to tasks for ~5 minutes with Mod A verbal cus for redirection.  SLP Short Term Goal 2 (Week 1): Patient will orient to place, month and situation with Max A multimodal cues.  SLP Short Term Goal 3 (Week 1): Patient will verbalize 2 physical/cognitive changes since hospitalization with Max A multomodal cues.  SLP Short Term Goal 4 (Week 1): Patient will demonstrate functional problem solving during very basic ADL tasks with Max A multimodal cues.  SLP Short Term Goal 5 (Week 1): Patient will consume current diet with minimal overt s/s of aspiration with supervision verbal cues.  SLP Short Term Goal 6 (Week 1): Patient will demonstrate efficient mastication and complete oral clearance with trials of regular textures over 2 sessions with supervision verbal cues prior to upgrade.   Refer to Care Plan for Long Term Goals  Recommendations for other services: Neuropsych  Discharge Criteria: Patient will be discharged from SLP if patient refuses treatment 3 consecutive times without medical reason, if treatment goals not met, if there is a change in medical status, if patient makes no progress towards goals or if patient is discharged from hospital.  The above assessment, treatment plan, treatment alternatives and goals were discussed and mutually agreed upon: by patient  Randall Rampersad 08/29/2018, 11:35 AM

## 2018-08-29 NOTE — Progress Notes (Signed)
Per nursing, patient was given "Data Collection Information Summary for Patients in Inpatient Rehabilitation Facilities with attached Privacy Act Statement Health Care Records" upon admission.    Patient information reviewed and entered into e[facility]hab System by Becky Kaydra Borgen, PPS coordinator. Information including medical coding, function ability, and quality indicators will be reviewed and updated through discharge.   

## 2018-08-29 NOTE — Progress Notes (Signed)
Pt in a canopy bed and unable to place CPAP at this time.

## 2018-08-29 NOTE — Progress Notes (Signed)
[facility] PHYSICAL MEDICINE & REHABILITATION PROGRESS NOTE  Subjective/Complaints: Patient seen laying in his canopy bed this morning.  He states he did not sleep well overnight due to headaches, however he is circular about receiving medications.  No reported issues overnight.  He is still sleepy this morning.  ROS: Unable to assess due to cognition  Objective: Vital Signs: Blood pressure (!) 155/72, pulse 80, temperature 97.9 F (36.6 C), temperature source Oral, resp. rate 17, height 5\' 6"  (1.676 m), weight 91.3 kg, SpO2 100 %. Dg Chest Port 1 View  Result Date: 08/28/2018 CLINICAL DATA:  Cough. EXAM: PORTABLE CHEST 1 VIEW COMPARISON:  07/21/2018 FINDINGS: The heart is within normal limits in size and stable. There is tortuosity and calcification of the thoracic aorta. Chronic bronchitic lung changes but no definite infiltrates, effusions or edema. The bony structures are intact. Stable degenerative changes involving both shoulders. IMPRESSION: No acute cardiopulmonary findings. Electronically Signed   By: Marijo Sanes M.D.   On: 08/28/2018 19:42   Recent Labs    08/29/18 0659  WBC 11.0*  HGB 12.0*  HCT 36.1*  PLT 298   Recent Labs    08/29/18 0659  NA 140  K 4.0  CL 106  CO2 23  GLUCOSE 184*  BUN 8  CREATININE 0.69  CALCIUM 8.6*    Physical Exam: BP (!) 155/72 (BP Location: Right Arm)   Pulse 80   Temp 97.9 F (36.6 C) (Oral)   Resp 17   Ht 5\' 6"  (1.676 m)   Wt 91.3 kg   SpO2 100%   BMI 32.49 kg/m  Constitutional: No distress . Vital signs reviewed. HENT: Status post craniotomy Eyes: EOMI. No discharge. Cardiovascular: RRR. No JVD. Respiratory: CTA Bilaterally. Normal effort. GI: BS +. Non-distended. Musc: No edema or tenderness in extremities. Neuro: Somnolent and oriented x1 Motor: Appears to be grossly 4+/5 throughout  Skin: Scab on lip Psych: Confused.  Disoriented.  Assessment/Plan: 1. Functional deficits secondary to meningioma with subsequent  IVH and SDH status post craniotomy which require 3+ hours per day of interdisciplinary therapy in a comprehensive inpatient rehab setting.  Physiatrist is providing close team supervision and 24 hour management of active medical problems listed below.  Physiatrist and rehab team continue to assess barriers to discharge/monitor patient progress toward functional and medical goals  Care Tool:  Bathing        Body parts bathed by helper: Right arm, Left arm, Chest, Abdomen, Front perineal area, Buttocks, Right upper leg, Left upper leg, Right lower leg, Left lower leg Body parts n/a: Face   Bathing assist Assist Level: 2 Helpers     Upper Body Dressing/Undressing Upper body dressing   What is the patient wearing?: Hospital gown only    Upper body assist Assist Level: 2 Helpers    Lower Body Dressing/Undressing Lower body dressing      What is the patient wearing?: Incontinence brief     Lower body assist Assist for lower body dressing: 2 Helpers     Toileting Toileting    Toileting assist Assist for toileting: 2 Helpers     Transfers Chair/bed transfer  Transfers assist     Chair/bed transfer assist level: Moderate Assistance - Patient 50 - 74%     Locomotion Ambulation   Ambulation assist              Walk 10 feet activity   Assist           Walk 50 feet activity  Assist           Walk 150 feet activity   Assist           Walk 10 feet on uneven surface  activity   Assist           Wheelchair     Assist               Wheelchair 50 feet with 2 turns activity    Assist            Wheelchair 150 feet activity     Assist            Medical Problem List and Plan: 1.Decline in mobility and self caresecondary toMeningioma with post operative IVH and SDH  Begin CIR  Notes reviewed, chest x-ray reviewed-unremarkable for acute process, labs reviewed 2. DVT Prophylaxis/Anticoagulation:  Pharmaceutical:Lovenox 3. Pain Management:Tylenol prn 4. Mood:LCSW to follow for evaluation and support. 5. Neuropsych: This patientis not fullycapable of making decisions onhisown behalf.  Continue tele-sitter and enclosure bed for safety 6. Skin/Wound Care:Routine pressure relief measures. 7. Fluids/Electrolytes/Nutrition:Monitor I/O.  8. T2DM: Was on Januvia, metformin and glucotrol PTA--Monitor BS ac/hs. Will use SSI for elevated BS.  Monitor with increased mobility 9. HTN: Monitor BP bid. Controlled without medications at this time.-  Monitor with increased mobility 10.SVT: Monitor HR bid--controlled on metoprolol 25 mg qid.  Monitor with increased mobility 11. OSA: Continue to encourage CPAP use.  12.CAD: Stable. Continue low dose ASA and BB. 13. Delirium/encephalopathy;Resolving. Monitor for worsening with change of environment.On Seroquel at nights.Sleep wake chart to monitor sleep pattern. Frequent checks and Telesitter for safety. 14. Leucocytosis:   WBCs 11.0 on 2/14  Afebrile  Labs ordered for Monday 15. Hypokalemia:  Added supplement. Due to recent SVT likely need to keep K>/+ 4.0.   Potassium 4.0 on 2/14  Labs ordered for Monday 16. 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  Hypoalbuminemia  Supplement initiated on 2/14 18.  Acute blood loss anemia  Hemoglobin 12.0 on 2/14  Continue monitor  LOS: 1 days A FACE TO FACE EVALUATION WAS PERFORMED   Lorie Phenix 08/29/2018, 9:43 AM

## 2018-08-29 NOTE — Progress Notes (Signed)
Initial Nutrition Assessment  DOCUMENTATION CODES:   Obesity unspecified  INTERVENTION:   - Continue Pro-stat 30 ml BID, each supplement provides 100 kcal and 15 grams of protein  - Glucerna Shake po BID, each supplement provides 220 kcal and 10 grams of protein  - Encourage adequate PO intake and provide feeding assistance as needed  NUTRITION DIAGNOSIS:   Increased nutrient needs related to (therapies) as evidenced by estimated needs.  GOAL:   Patient will meet greater than or equal to 90% of their needs  MONITOR:   PO intake, Supplement acceptance, Labs, Weight trends  REASON FOR ASSESSMENT:   Malnutrition Screening Tool    ASSESSMENT:   78 year old male with PMH significant forCAD, T2DM, HTN, OSA compliant with CPAP who was evaluated in the ED01/06/20with3-4 week history of intermittent bouts of confusionwith lability,headaches, difficulty speaking, and visual changes. MRI brain revealed large meningioma level of cerebral aqueduct with vasogenic edema of posterior medial temporal lobes and obstructive hydrocephalus. Pt was referred to [facility] for evaluation and underwent craniotomy withresection of pineal region massand placement of EVDon 08/11/18. Drain removed with 1/30 and on 1/31 he developed decrease in LOC requiring transfer to ICU. CT head showed new IVH in anterior horn of R lateral ventricle. MRI brain showed small amount of hemorrhage at resection site with focal hemorrhage/clot within R ventricle. Pt admitted to CIR on 2/13.  Attempted to speak with pt at bedside. Pt sleeping and did not awaken to RD voice. Pt in Enclosure bed with Telesitter for safety at this time. Reviewed SLP notes. Per notes, pt ate breakfast with SLP this morning.  No family present at time of visit. Will attempt to obtain full diet and weight history at follow-up.  Reviewed weight history in chart. Noted pt with 13.7 kg weight loss in 6 months. This is a 13% weight loss which is  significant for timeframe. Will complete NFPE at follow-up to evaluate for malnutrition.  Noted MD ordered Pro-stat supplements. RD to order Glucerna supplement to aid in pt meeting increased kcal and protein needs given pt's meal completion is 25-50%.  Meal Completion: 25-50% x 2 meals  Medications reviewed and include: SSI, Protonix, K-dur 20 mEq BID  Labs reviewed. CBG's: 158, 132, 198 x 24 hours  NUTRITION - FOCUSED PHYSICAL EXAM:  Unable to complete. Pt in enclosure bed at time of visit. Will complete at follow-up.  Diet Order:   Diet Order            DIET DYS 3 Room service appropriate? Yes with Assist; Fluid consistency: Thin  Diet effective now              EDUCATION NEEDS:   Not appropriate for education at this time  Skin:  Skin Assessment: Reviewed RN Assessment  Last BM:  2/13 smear type 4  Height:   Ht Readings from Last 1 Encounters:  08/28/18 5\' 6"  (1.676 m)    Weight:   Wt Readings from Last 10 Encounters:  08/28/18 91.3 kg  07/21/18 99.8 kg  07/21/18 100.6 kg  07/04/18 101.8 kg  02/21/18 105 kg  01/23/18 104.9 kg  11/11/17 103.2 kg  10/22/17 104 kg  07/31/17 103.3 kg  04/30/17 102.2 kg    Ideal Body Weight:  64.5 kg  BMI:  Body mass index is 32.49 kg/m.  Estimated Nutritional Needs:   Kcal:  1900-2100  Protein:  95-110 grams  Fluid:  1.9-2.1 L    Gaynell Face, MS, RD, LDN Inpatient Clinical Dietitian  Pager: [phone] Weekend/After Hours: [phone]

## 2018-08-29 NOTE — Progress Notes (Signed)
Occupational Therapy Session Note  Patient Details  Name: Jason Farrell. MRN: [id number] Date of Birth: 12/27/1940  Today's Date: 08/29/2018 OT Individual Time: 1434-1510 OT Individual Time Calculation (min): 36 min    Short Term Goals: Week 1:  OT Short Term Goal 1 (Week 1): Pt will locate 1 grooming item on the sink with mod questioning cues OT Short Term Goal 2 (Week 1): Pt will complete toilet transfer with min A of 1 OT Short Term Goal 3 (Week 1): Pt will complete 1 step of LB dressing activity  with mod instructional cues OT Short Term Goal 4 (Week 1): Pt will complete 1 step of toothbrushing activity with mod instructional cues  Skilled Therapeutic Interventions/Progress Updates:    Pt seen for OT session focusing on ADL re-training. Pt sleeping in enclosure bed upon arrival with wife and daughter present. Pt easily awoken and agreeable to tx session. He was oriented to self only. Max A and max multi-modal cuing to facilitate transfer to EOB 2/2 sequencing and visual deficits. Completed mod A stand pivot transfer to w/c.  Grooming tasks completed from w/c level at sink. Pt initially requiring max hand over hand assist to wash face, however, once started was able to finish task to completion. Therapist assisted with shaving total A for safety, pt requesting to look in mirror following task, reports he was able to see mirror, unsure of reliability.  He was able to doff shirt with cuing, donned shirt with mod-max A overall with max A for clothing orientation 2/2 visual deficits.  Pt and family educated throughout session regarding role of OT, POC, and d/c planning. Pt left seated at nurses station at end of session for supervision, chair belt alarm on.   Therapy Documentation Precautions:  Restrictions Weight Bearing Restrictions: No Pain: Pain Assessment Pain Score: 0-No pain   Therapy/Group: Individual Therapy  Jason Farrell L 08/29/2018, 7:20 AM

## 2018-08-29 NOTE — Evaluation (Signed)
Physical Therapy Assessment and Plan  Patient Details  Name: Jason Farrell. MRN: [id number] Date of Birth: [birth year]  PT Diagnosis: Abnormal posture, Abnormality of gait, Cognitive deficits, Coordination disorder, Difficulty walking, Impaired cognition and Muscle weakness Rehab Potential: Good ELOS: 14-18 days   Today's Date: 08/29/2018 PT Individual Time: 1103-1200 PT Individual Time Calculation (min): 57 min    Problem List:  Patient Active Problem List   Diagnosis Date Noted  . Acute blood loss anemia   . Hypoalbuminemia due to protein-calorie malnutrition (Jason Farrell)   . Hypokalemia   . Leukocytosis   . Encephalopathy   . SVT (supraventricular tachycardia) (Jason Farrell)   . 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 Debility   . Meningioma (Jason Farrell)   . Nontraumatic intraventricular intracerebral hemorrhage (Jason Farrell)   . Senile purpura (Jason Farrell) 02/21/2018  . Type 2 diabetes mellitus with microalbuminuria, without long-term current use of insulin (Jason Farrell) 02/21/2018  . Controlled type 2 diabetes mellitus without complication, without long-term current use of insulin (Jason Farrell) 11/11/2017  . 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 Obstructive sleep apnea on CPAP 12/09/2015  . Acid reflux 05/06/2015  . Arthritis, degenerative 05/06/2015  . Atherosclerosis of native coronary artery of native heart with stable angina pectoris (Jason Farrell) 01/25/2015  . Diabetes mellitus type 2 in obese (Jason Farrell) 08/16/2014  . Hyperlipidemia 08/16/2014  . Essential hypertension 08/16/2014  . Morbid obesity ([place]) 08/16/2014  . Stopped smoking with greater than 30 pack year history 08/16/2014  . COLD (Jason obstructive lung disease) (Jason Farrell) 08/16/2014    Past Medical History:  Past Medical History:  Diagnosis Date  . Coronary artery disease   . Diabetes mellitus without complication ([place])   . Dyslipidemia   . GERD (gastroesophageal reflux disease)   . Hyperlipidemia   . Hypertension   . MI (myocardial infarction) ([place])   .  Osteoarthritis    Past Surgical History:  Past Surgical History:  Procedure Laterality Date  . CARDIAC CATHETERIZATION  2006   stent placement  . COLONOSCOPY    . CORONARY ANGIOPLASTY WITH STENT PLACEMENT  2006   stent x 2   . CORONARY ANGIOPLASTY WITH STENT PLACEMENT  1996   stent x 3   . FOOT SURGERY Right   . HALLUX VALGUS CHEILECTOMY Right 02/25/2015   Procedure: HALLUX VALGUS CHEILECTOMY;  Surgeon: Jason Farrell, Jason Farrell;  Location: [facility] ORS;  Service: Podiatry;  Laterality: Right;    Assessment & Plan Clinical Impression: Patient is a 78 y.o. year old male with recent admission to the hospital on Jason Farrell os a 78 year old male with history ofCAD, T2DM, HTN, OSA--compliant with CPAP who was evaluated in the ED01/06/20with3-4 week history of intermittent bouts of confusionwith lability,headaches, difficulty speaking and visual changes. MRI brain revealed large meningioma level of cerebral aqueduct with vasogenic edema of posterior medial temporal lobes and obstructive hydrocephalus. He was referred to Jason Farrell for evaluation and underwent Craniotomy withresection of pineal region massand placement of EVDon 08/11/2017 by Jason Farrell. Post procedure treated with decadron and IV Keppra X 7 days. Drain removed with 1/30 and on 1/31 he developed decrease in LOC requiring transfer to ICU. 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 MRI brain showed small amount of hemorrhage at resection site with focal hemorrhage/clot within right ventricle and unchanged appearance of increased T2/Flair signal. CTA head/neck done reveling narrow/diminshed flow in right V4 segment question due to dissection, thrombus or vasospasm.   He with resultant confusion with agitation and loss of  bilateral vision. Neurology felt that patient'sMS changes were felt to bedue to delirium and did not feel seizures were cause -- d/c Keppra after 7 days. Repeat CT head showed no  change in small left parietal SDH with moderate R>L ventriculomegaly and no significant interval change. Ophthalmology (Jason Farrell) consulted and felt that visual loss cortical in nature due to lack of ophthalmologic finding --likely due to PRES over PION. To monitor over several weeks and to follow up with neuroophthalmologist in the future. Left corneal abrasion treated with proparacaine and erythromycin ointment. ABLA being monitored, hypocalcemia/hypokalemia treated with supplementation and leucocytosis felt to be reactive due to steriods.   Plans were to admit patient to CIR on 02/07 but he developed increase in confusion and lethargy.CBC showed rise in WBC to 30 and LP/BC done for work up. CSF/BC negative.Repeat CT head showedimprovement in intraventricular blood and intraventricular air. Leucocytosis felt to be due to steroids.Seroquel added at nights to help with sleep wake disruption and Zyprexa being used prn agitation during the day. He developed SVT with HR up to 150's on 2/11 and lopressor added with resolution. On dysphagia 3 diet with question of aspiration --MBS not done due to cognitive deficits as well as body habitus. He continues to be confused, is oriented to self only, has visual deficits and responds to visual threat at times, continues to wax and wane with poor safety awarenss and impulsivity. Therapy ongoing and he is showing improvement with therapy therefore CIR recommended due to functional deficits.  Patient transferred to CIR on 08/28/2018 .   Patient currently requires mod with mobility secondary to muscle weakness, decreased cardiorespiratoy endurance, motor apraxia and decreased coordination, decreased visual acuity and decreased visual perceptual skills, decreased attention, decreased awareness, decreased problem solving, decreased safety awareness, decreased memory and delayed processing, and decreased standing balance, decreased postural control and decreased  balance strategies.  Prior to hospitalization, patient was independent  with mobility and lived with Spouse in a House home.  Home access is 3Stairs to enter.  Patient will benefit from skilled PT intervention to maximize safe functional mobility, minimize fall risk and decrease caregiver burden for planned discharge home with 24 hour assist.  Anticipate patient will benefit from follow up [facility] at discharge.  PT - End of Session Activity Tolerance: Tolerates 30+ min activity with multiple rests Endurance Deficit: Yes Endurance Deficit Description: 2/2 generalized deconditioning PT Assessment Rehab Potential (ACUTE/IP ONLY): Good PT Barriers to Discharge: Home environment access/layout;Inaccessible home environment PT Barriers to Discharge Comments: steps to enter home & pt may need ramp 2/2 visual/cognitive deficits PT Patient demonstrates impairments in the following area(s): Balance;Endurance;Perception;Motor;Behavior;Sensory;Safety PT Transfers Functional Problem(s): Bed Mobility;Bed to Chair;Car;Floor;Furniture PT Locomotion Functional Problem(s): Ambulation;Stairs PT Plan PT Intensity: Minimum of 1-2 x/day ,45 to 90 minutes PT Frequency: 5 out of 7 days PT Duration Estimated Length of Stay: 14-18 days PT Treatment/Interventions: Ambulation/gait training;Balance/vestibular training;Cognitive remediation/compensation;Community reintegration;Discharge planning;Disease management/prevention;DME/adaptive equipment instruction;Functional mobility training;Neuromuscular re-education;Pain management;Patient/family education;Psychosocial support;Skin care/wound management;Splinting/orthotics;Stair training;Therapeutic Activities;Therapeutic Exercise;UE/LE Strength taining/ROM;UE/LE Coordination activities;Visual/perceptual remediation/compensation PT Transfers Anticipated Outcome(s): supervision<>CGA with LRAD PT Locomotion Anticipated Outcome(s): CGA<>min assist with LRAD PT  Recommendation Recommendations for Other Services: Speech consult;Neuropsych consult Follow Up Recommendations: 24 hour supervision/assistance;Home Farrell PT Patient destination: Home Equipment Recommended: To be determined  Skilled Therapeutic Intervention Patient received at nurses station in w/c with family (wife Manuela Schwartz & daughter Peggye Fothergill) arriving for session. No c/o pain reported during session. Pt's family educated on daily therapy schedule, weekly team meeting, safety precautions (  only staff should assist pt with mobility & feeding pt until they have been cleared by therapists), and other CIR information. Pt ambulates 25 ft + 70 ft + 70 ft initially with w/c follow for safety but mod assist overall with gait pattern as described below. Pt does report L knee fatigue during gait but reports hx of knee OA. Pt's family report pt's gait pattern is his baseline gait pattern but reports pt has not experienced any falls at home. Pt completes transfer to elevated sedan simulated height car with min assist for stand pivot but max assist to transfer BLE in/out of car. Pt completes stand pivot transfer w/c<>bed in apartment and completes bed mobility with max assist sit>supine for BLE to transfer on/off bed, CGA for rolling, and min assist R sidelying>sitting EOB. Pt's family states he has 3 steps with wide-set rails to enter home with therapist initiating conversation about potential need to install ramp for home access 2/2 pt's cognitive & visual deficits & potential difficulty with stair negotiation as a result. Pt's family declines handout regarding how to build a ramp, reporting they have someone that can assist them. At end of session pt left sitting in w/c in room with chair alarm donned, family present in room, & telesitter in room.  Pt perseverating and stating he is holding various objects (pocket knife, wrist band) but nothing is in his hands.   Pt is doing well physically, but requires significant  assistance 2/2 visual, cognitive & awareness deficits.  PT Evaluation Precautions/Restrictions Precautions Precautions: Fall Precaution Comments: impaired vision Restrictions Weight Bearing Restrictions: No  General Chart Reviewed: Yes Additional Pertinent History: CAD, DM2, HTN, OSA, dyslipidemia, HLD, MI, OA Response to Previous Treatment: Patient with no complaints from previous session. Family/Caregiver Present: Yes(wife & daughter)  Pain No c/o pain.   Home Living/Prior Functioning Home Living Available Help at Discharge: Family;Available 24 hours/day Type of Home: House Home Access: Stairs to enter [employer] of Steps: 3 Entrance Stairs-Rails: Right;Left(wide-set, can only access 1 at a time) Home Layout: Two level;Able to live on main level with bedroom/bathroom Bathroom Shower/Tub: Walk-in shower  Lives With: Spouse Prior Function Level of Independence: Independent with basic ADLs;Independent with homemaking with ambulation;Independent with gait;Independent with transfers  Able to Take Stairs?: Yes Driving: Yes Vocation: Retired Leisure: Hobbies-yes (Comment) Comments: woodworking, watching Jeopardy & the History channel  Vision/Perception  Pt wears glasses at all times at baseline. Pt with significant visual deficits (and per PA, Pam, pt with visual hallucinations) following medical event.    Cognition Overall Cognitive Status: Impaired/Different from baseline Arousal/Alertness: Awake/alert Orientation Level: Oriented to place;Oriented to time;Oriented to person;Oriented to situation("hospital", "brain surgery", month only) Memory: Impaired Memory Impairment: Decreased recall of new information;Decreased short term memory Decreased Short Term Memory: Verbal basic;Functional basic Awareness: Impaired Awareness Impairment: Intellectual impairment Problem Solving: Impaired Problem Solving Impairment: Functional basic Safety/Judgment:  Impaired  Sensation Sensation Light Touch: Not tested Proprioception: Impaired by gross assessment Coordination Gross Motor Movements are Fluid and Coordinated: No Fine Motor Movements are Fluid and Coordinated: No Coordination and Movement Description: ataxia, decreased smoothness and accuracy of fine motor and gross motor movements  Motor  Motor Motor: Ataxia;Motor apraxia Motor - Discharge Observations: generalized deconditioning   Mobility Bed Mobility Bed Mobility: Rolling Right;Rolling Left;Sit to Supine;Right Sidelying to Sit Rolling Right: Contact Guard/Touching assist Rolling Left: Contact Guard/Touching assist Right Sidelying to Sit: [place] - Patient 50-74% Sit to Supine: Maximal Assistance - Patient 25-49% Transfers Transfers: Sit to Stand;Stand  to Sit;Stand Pivot Transfers Sit to Stand: Minimal Assistance - Patient > 75% Stand to Sit: Minimal Assistance - Patient > 75% Stand Pivot Transfers: Minimal Assistance - Patient > 75% Stand Pivot Transfer Details: Tactile cues for weight shifting;Tactile cues for placement;Verbal cues for technique;Verbal cues for sequencing;Verbal cues for precautions/safety Transfer (Assistive device): None  Locomotion  Gait Ambulation: Yes Gait Assistance: 2 Helpers(mod assist + w/c followfor safety) Gait Distance (Feet): 25 Feet Assistive device: None Gait Gait: Yes Gait Pattern: Impaired Gait Pattern: Decreased stride length;Decreased step length - right;Decreased step length - left;Decreased weight shift to right;Decreased weight shift to left;Decreased dorsiflexion - left;Decreased dorsiflexion - right;Decreased hip/knee flexion - left;Decreased hip/knee flexion - right;Shuffle Gait velocity: decreased Stairs / Additional Locomotion Stairs: No Wheelchair Mobility Wheelchair Mobility: Yes Wheelchair Assistance: Moderate Assistance - Patient 50 - 74% Wheelchair Propulsion: Both upper extremities Wheelchair Parts  Management: Needs assistance Distance: 100 ft(linear path only)   Trunk/Postural Assessment  Thoracic Assessment Thoracic Assessment: Exceptions to WFL(rounded shoulders) Lumbar Assessment Lumbar Assessment: Exceptions to WFL(posterior pelvic tilt) Postural Control Postural Control: Deficits on evaluation Righting Reactions: impaired/delayed Protective Responses: impaired/delayed   Balance Balance Balance Assessed: Yes Dynamic Standing Balance Dynamic Standing - Balance Support: During functional activity;No upper extremity supported Dynamic Standing - Level of Assistance: 3: Mod assist Dynamic Standing - Balance Activities: (gait without AD)  Extremity Assessment  RUE Assessment General Strength Comments: difficulty formally testing 2/2 cognitive deficits- grossly WFL LUE Assessment General Strength Comments: difficulty formally testing 2/2 cognitive deficits- grossly WFL  RLE Assessment Active Range of Motion (AROM) Comments: WFL General Strength Comments: grossly 3+/5 as pt able to weight bear without buckling, not formally assessed 2/2 cognitive deficits LLE Assessment Active Range of Motion (AROM) Comments: WFL General Strength Comments: grossly 3+/5 as pt able to weight bear without buckling, not formally assessed 2/2 cognitive deficits    Refer to Care Plan for Long Term Goals  Recommendations for other services: Neuropsych  Discharge Criteria: Patient will be discharged from PT if patient refuses treatment 3 consecutive times without medical reason, if treatment goals not met, if there is a change in medical status, if patient makes no progress towards goals or if patient is discharged from hospital.  The above assessment, treatment plan, treatment alternatives and goals were discussed and mutually agreed upon: by family  [place] 08/29/2018, 2:03 PM

## 2018-08-29 NOTE — Progress Notes (Signed)
[name], MD  Physician  Physical Medicine and Rehabilitation  PMR Pre-admission  Signed  Date of Service:  08/28/2018 10:38 AM          Signed         Show:Clear all [x] Manual[x] Template[x] Copied  Added by: [x] Kirsteins, Luanna Salk, MD  [] Hover for details [employer] PMR Admission Coordinator Pre-Admission Assessment  Patient: Jason Farrell. is an 78 y.o., male MRN: [id number] DOB: 02/07/1941 Height:   Weight:    Insurance Information HMO:     PPO:      PCP:      IPA:      80/20:      OTHER: no HMO PRIMARY: Medicare a and b      Policy#: [id number]      Subscriber: pt Benefits:  Phone #: passport one online     Name: 08/21/2018 Eff. Date: 02/14/2008     Deduct: $1408      Out of Pocket Max: none      Life Max: none CIR: 100%      SNF: 20 full days Outpatient: 80%     Co-Pay: 20% Home Health: 100%      Co-Pay: none DME: 80%     Co-Pay: 20% Providers: pt choice  SECONDARY: [employer]      Policy#: [id number]   Medicaid Application Date:       Case Manager:  Disability Application Date:       Case Worker:   Emergency Contact Information         Contact Information    Name Relation Home Work Mobile   Welte,[name] Spouse   [phone]      Current Medical History  Patient Admitting Diagnosis: Resection of pineal region mass  History of Present Illness: Patient presented on 08/11/2018 for evaluation after being seen in the ED 1/26 for headaches and symptoms of difficulty with speech, sleep disturbance and behavioral changes since onset of summer. Significant worsening over past 3 to 4 weeks. Patient wandering, emotionally labile and crying frequently. 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  EVD/subgaleal drain removed with post removal hemorrhage. Neurology consulted. CT demonstrated intra ventricular hemorrhage in the anterior horn of the right lateral ventricle.  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  Postoperative hospital course noted with delirium with pt receiving Seroquel, sleep hygiene and tapering steroids.  Left eye corneal abrasion responded to proparacaine, fluorescein dye exam , as well as continued erythromycin ointment to the affected eye for 4 days.  Patient developed confusion and bilateral loss of vision on POD 3. Patient had EVD pulled on 1/30 and developed intraventricular hemorrhage, confusion and bilateral vision loss afterwards. Vision grossly intact before. Ophthalmology consulted. 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 Felt no intervention needed. PRES felt may take several weeks to recover at which patients may have variable degree of visual recovery,. To follow up as an outpatient.   Patient was scheduled to admit to CIR last week, but he had increased confusion and altered mental status.  He was in mittens due to pulling out lines.  He is now pleasantly confused and out of mittens.  He is participating well with therapies and is now ready for a comprehensive inpatient rehab program.  Patient's medical record from [facility] has been reviewed by the rehabilitation admission coordinator and physician.  Past Medical History      Past Medical History:  Diagnosis Date  .  Coronary artery disease   . Diabetes mellitus without complication ([place])   . Dyslipidemia   . GERD (gastroesophageal reflux disease)   . Hyperlipidemia   . Hypertension   . MI (myocardial infarction) ([place])   . Osteoarthritis     Family History   family history includes Alcohol abuse in his father; COPD in his sister; Cancer in his sister; Heart disease in his sister; Hyperlipidemia in his father and sister; Hypertension in his father and sister.  Prior Rehab/Hospitalizations Has the patient had major surgery during 100 days prior to admission? No              Current Medications  Current Meds since  08/11/2018  See MAR No current facility-administered medications for this encounter.   Current Outpatient Medications:  .  amLODipine (NORVASC) 10 MG tablet, Take 1 tablet (10 mg total) by mouth daily., Disp: 90 tablet, Rfl: 1 .  aspirin 81 MG tablet, Take 81 mg by mouth daily., Disp: , Rfl:  .  atorvastatin (LIPITOR) 40 MG tablet, Take 1 tablet (40 mg total) by mouth daily., Disp: 90 tablet, Rfl: 1 .  Coenzyme Q10 (CO Q10) 200 MG CAPS, Take 1 capsule by mouth daily., Disp: , Rfl:  .  glipiZIDE (GLUCOTROL) 5 MG tablet, Take 1 tablet (5 mg total) by mouth 2 (two) times daily before a meal., Disp: 180 tablet, Rfl: 0 .  glucose blood test strip, Use as instructed, Disp: 100 each, Rfl: 12 .  Influenza vac split quadrivalent PF (FLUZONE HIGH-DOSE) 0.5 ML injection, Fluzone High-Dose 2019-20 (PF) 180 mcg/0.5 mL intramuscular syringe  ADM 0.5ML IM UTD, Disp: , Rfl:  .  lisinopril-hydrochlorothiazide (ZESTORETIC) 20-12.5 MG tablet, Take 1 tablet by mouth 2 (two) times daily., Disp: 180 tablet, Rfl: 1 .  metFORMIN (GLUCOPHAGE-XR) 750 MG 24 hr tablet, Take 2 tablets (1,500 mg total) by mouth daily with breakfast., Disp: 180 tablet, Rfl: 1 .  metoprolol succinate (TOPROL-XL) 50 MG 24 hr tablet, Take 1 tablet (50 mg total) by mouth daily. Take with or immediately following a meal., Disp: 90 tablet, Rfl: 0 .  Multiple Vitamins-Minerals (CENTRUM SILVER PO), Take by mouth daily., Disp: , Rfl:  .  Omega-3 Fatty Acids (FISH OIL) 1200 MG CAPS, Take 1,200 mg by mouth daily., Disp: , Rfl:  .  omeprazole (PRILOSEC) 40 MG capsule, Take 1 capsule (40 mg total) by mouth daily., Disp: 90 capsule, Rfl: 1 .  sitaGLIPtin (JANUVIA) 100 MG tablet, Take 1 tablet (100 mg total) by mouth daily., Disp: 90 tablet, Rfl: 1 .  Vitamin D, Cholecalciferol, 1000 units TABS, Take 1,000 units of lipase by mouth daily. , Disp: , Rfl:   Patients Current Diet:      Diet Order    None      Precautions / Restrictions   Fall  precautions Impulsive with decreased safety awareness  Has the patient had 2 or more falls or a fall with injury in the past year?No  Prior Activity Level    Declining memory since summer  Prior Functional Level Do you want   from other? Self Care: Did the patient need help bathing, dressing, using the toilet or eating?  Independent  Indoor Mobility: Did the patient need assistance with walking from room to room (with or without device)? Independent  Stairs: Did the patient need assistance with internal or external stairs (with or without device)? Independent  Functional Cognition: Did the patient need help planning regular tasks such as shopping or remembering to  take medications? Independent  Home Assistive Devices / Equipment  Prior Device Use: Indicate devices/aids used by the patient prior to current illness, exacerbation or injury? None of the above  Prior Functional Level Independent   Prior Functional Level Current Functional Level  Bed Mobility   Independent  mod assist with rail with raised HOB and 2 person assist. multiple verbal and tactile cues and increased time to complete.   Transfers Independent Min assist with RW and 2 person assist.  Mobility - Walk/Wheelchair Independent ; shuffling gait   impulsive, inconsistent in following directions, requires cuing; unable to accurately report objects held in visual field.  Mobility - Ambulation/Gait   shuffling gait   bed to chair; requires multimodal cuing 1 step command. 2/5 ambulated 30 feet with RW min assist of 1 and close chair follow. Verbal and tactile cues to leave RW on the ground when ambulating. Pushing RW too far in front of him during gait. Decreased step length and speed.   Upper Body Dressing independent   mod assist doff/don gown  Lower Body Dressing independent   toilet hygiene max assist; mod assist LB dressing  Grooming independent   standing at sink with mod assist to wash hands    Eating/Drinking independent Mod assist  Toilet Transfer independent Mod assist  Bladder Continence continent Had foley catheter  Bowel Management  continent   Stair Climbing independent Dependent  Communication intact   Memory Increasing confusion since summer   grossly functional oropharyngeal swallow; mod cognitive impairment. Self feeding difficulty due to visuospatial deficits.  Functional 1 and 2 step command following. Oriented to self. impairments in attention, memory, delayed recall and executive problem solving.   Cooking/Meal Prep  independent     Housework  independent   Money Management  Wife assisted   Driving  independent     Special needs/care consideration BiPAP/CPAP yes CPAP pta CPM n/a Continuous Drip IV: n/a Dialysis n/a Life Vest n/a Oxygen n/a Special Bed n/a Trach Size n/a Wound Vac n/a Skin WOC consulted 08/12/2018. Face and chest skin breakdown. Patient underwent 11 hour surgery in prone position 08/11/2018. Purple ecchymotic area to right upper lip. Unable to determine if it is deep tissue injury or true ecchymosis, but appears to be latter. Anterior upper chest just above nipple line with 6 cm X 23 cm non blanchable erythema. No broken skin present. Skin marker used to outline erythema. Remainder of skin outside of surgical incisions unremarkable. No treatment required. recommended to continue to monitor chest erythema for evolution of wound. May result in serous filled blisters which would require treatment. WOC consulted 08/12/2018 . Scant serosanguinous drainage to occipital dressing; right frontal dressing dray.  Bowel mgmt: LBM 2/4 continent Bladder mgmt: able to tell RN when needs to void and able to use urinal; urgency noted Diabetic mgmt yes  Previous Home Environment  Discharge Living Setting  Social/Family/Support Systems  Goals/Additional Needs  Patient Condition: I have reviewed medical records from [facility], spoken  with  CM, and spouse. I discussed via phone and reviewed faxed clinical information  for inpatient rehabilitation assessment.  Patient will benefit from ongoing PT, OT, and SLP, can actively participate in 3 hours of therapy a day 5 days of the week, and can make measurable gains during the admission.  Patient will also benefit from the coordinated team approach during an Inpatient Acute Rehabilitation admission.  The patient will receive intensive therapy as well as Rehabilitation physician, nursing, social worker, and care management interventions.  Due to bowel management, bladder management, safety, skin/wound care, disease management, medical administration, pain management, patient education the patient requires 24 hour a day rehabilitation nursing.  The patient is currently min to mod assist with 1 step commands and cuing with mobility and basic ADLs.  Discharge setting and therapy post discharge at  home with home health is anticipated.  Patient has agreed to participate in the Acute Inpatient Rehabilitation Program and will admit today  Preadmission Screen Completed By:  [name] RN MSN, 08/28/2018 10:38 AM ______________________________________________________________________   Discussed status with Dr. Letta Pate  on  08/28/2018 at 1025 and received telephone approval for admission today.  Admission Coordinator:  [name] RN MSN, time 1025/Date 08/28/2018   Assessment/Plan: Diagnosis:IVH 1. Does the need for close, 24 hr/day  Medical supervision in concert with the patient's rehab needs make it unreasonable for this patient to be served in a less intensive setting? Yes 2. Co-Morbidities requiring supervision/potential complications: HTN, PRES, Diabetes 3. Due to bladder management, bowel management, safety, skin/wound care, disease management, medication administration, pain management and patient education, does the patient require 24 hr/day rehab nursing? Yes 4. Does the  patient require coordinated care of a physician, rehab nurse, PT (1-2 hrs/day, 5 days/week), OT (1-2 hrs/day, 5 days/week) and SLP (.5-1 hrs/day, 5 days/week) to address physical and functional deficits in the context of the above medical diagnosis(es)? Yes Addressing deficits in the following areas: balance, endurance, locomotion, strength, transferring, bowel/bladder control, bathing, dressing, feeding, grooming, toileting, cognition, speech, language, swallowing and psychosocial support 5. Can the patient actively participate in an intensive therapy program of at least 3 hrs of therapy 5 days a week? Yes 6. The potential for patient to make measurable gains while on inpatient rehab is good 7. Anticipated functional outcomes upon discharge from inpatients are: modified independent PT, modified independent OT, modified independent SLP 8. Estimated rehab length of stay to reach the above functional goals is: 10-14d 9. Anticipated D/C setting: Home 10. Anticipated post D/C treatments: [place] therapy 11. Overall Rehab/Functional Prognosis: good     Karene Fry, RN MSN [name] MD 08/28/2018

## 2018-08-30 ENCOUNTER — Inpatient Hospital Stay (HOSPITAL_COMMUNITY)
Admission: AD | Admit: 2018-08-30 | Discharge: 2018-09-04 | DRG: 308 | Disposition: A | Discharge status: Rehabilitation Facility | Payer: Medicare Other | Source: Ambulatory Visit | Attending: Internal Medicine | Admitting: Internal Medicine

## 2018-08-30 ENCOUNTER — Inpatient Hospital Stay (HOSPITAL_COMMUNITY): Payer: Medicare Other

## 2018-08-30 ENCOUNTER — Inpatient Hospital Stay (HOSPITAL_COMMUNITY): Payer: Managed Care, Other (non HMO) | Admitting: Physical Therapy

## 2018-08-30 ENCOUNTER — Inpatient Hospital Stay (HOSPITAL_COMMUNITY): Payer: Managed Care, Other (non HMO)

## 2018-08-30 ENCOUNTER — Inpatient Hospital Stay (HOSPITAL_COMMUNITY): Payer: Managed Care, Other (non HMO) | Admitting: Occupational Therapy

## 2018-08-30 ENCOUNTER — Other Ambulatory Visit: Payer: Self-pay

## 2018-08-30 ENCOUNTER — Encounter (HOSPITAL_COMMUNITY): Payer: Self-pay

## 2018-08-30 DIAGNOSIS — R410 Disorientation, unspecified: Secondary | ICD-10-CM | POA: Diagnosis not present

## 2018-08-30 DIAGNOSIS — Z23 Encounter for immunization: Secondary | ICD-10-CM | POA: Diagnosis not present

## 2018-08-30 DIAGNOSIS — D320 Benign neoplasm of cerebral meninges: Secondary | ICD-10-CM | POA: Diagnosis not present

## 2018-08-30 DIAGNOSIS — E876 Hypokalemia: Secondary | ICD-10-CM | POA: Diagnosis not present

## 2018-08-30 DIAGNOSIS — Z8679 Personal history of other diseases of the circulatory system: Secondary | ICD-10-CM | POA: Diagnosis not present

## 2018-08-30 DIAGNOSIS — E46 Unspecified protein-calorie malnutrition: Secondary | ICD-10-CM | POA: Diagnosis not present

## 2018-08-30 DIAGNOSIS — I10 Essential (primary) hypertension: Secondary | ICD-10-CM | POA: Diagnosis present

## 2018-08-30 DIAGNOSIS — E119 Type 2 diabetes mellitus without complications: Secondary | ICD-10-CM | POA: Diagnosis present

## 2018-08-30 DIAGNOSIS — H9193 Unspecified hearing loss, bilateral: Secondary | ICD-10-CM | POA: Diagnosis not present

## 2018-08-30 DIAGNOSIS — R0989 Other specified symptoms and signs involving the circulatory and respiratory systems: Secondary | ICD-10-CM | POA: Diagnosis not present

## 2018-08-30 DIAGNOSIS — Z8349 Family history of other endocrine, nutritional and metabolic diseases: Secondary | ICD-10-CM

## 2018-08-30 DIAGNOSIS — Z86011 Personal history of benign neoplasm of the brain: Secondary | ICD-10-CM | POA: Diagnosis not present

## 2018-08-30 DIAGNOSIS — I252 Old myocardial infarction: Secondary | ICD-10-CM

## 2018-08-30 DIAGNOSIS — Z6830 Body mass index (BMI) 30.0-30.9, adult: Secondary | ICD-10-CM | POA: Diagnosis not present

## 2018-08-30 DIAGNOSIS — D329 Benign neoplasm of meninges, unspecified: Secondary | ICD-10-CM | POA: Diagnosis present

## 2018-08-30 DIAGNOSIS — Z955 Presence of coronary angioplasty implant and graft: Secondary | ICD-10-CM

## 2018-08-30 DIAGNOSIS — R0902 Hypoxemia: Secondary | ICD-10-CM | POA: Diagnosis present

## 2018-08-30 DIAGNOSIS — R441 Visual hallucinations: Secondary | ICD-10-CM | POA: Diagnosis present

## 2018-08-30 DIAGNOSIS — I2510 Atherosclerotic heart disease of native coronary artery without angina pectoris: Secondary | ICD-10-CM

## 2018-08-30 DIAGNOSIS — Z79899 Other long term (current) drug therapy: Secondary | ICD-10-CM | POA: Diagnosis not present

## 2018-08-30 DIAGNOSIS — I471 Supraventricular tachycardia, unspecified: Secondary | ICD-10-CM

## 2018-08-30 DIAGNOSIS — G9340 Encephalopathy, unspecified: Secondary | ICD-10-CM | POA: Diagnosis not present

## 2018-08-30 DIAGNOSIS — E669 Obesity, unspecified: Secondary | ICD-10-CM | POA: Diagnosis present

## 2018-08-30 DIAGNOSIS — D62 Acute posthemorrhagic anemia: Secondary | ICD-10-CM | POA: Diagnosis not present

## 2018-08-30 DIAGNOSIS — Z7984 Long term (current) use of oral hypoglycemic drugs: Secondary | ICD-10-CM | POA: Diagnosis not present

## 2018-08-30 DIAGNOSIS — Z825 Family history of asthma and other chronic lower respiratory diseases: Secondary | ICD-10-CM

## 2018-08-30 DIAGNOSIS — G4733 Obstructive sleep apnea (adult) (pediatric): Secondary | ICD-10-CM | POA: Diagnosis not present

## 2018-08-30 DIAGNOSIS — F09 Unspecified mental disorder due to known physiological condition: Secondary | ICD-10-CM | POA: Diagnosis not present

## 2018-08-30 DIAGNOSIS — Z87891 Personal history of nicotine dependence: Secondary | ICD-10-CM | POA: Diagnosis not present

## 2018-08-30 DIAGNOSIS — H47619 Cortical blindness, unspecified side of brain: Secondary | ICD-10-CM | POA: Diagnosis present

## 2018-08-30 DIAGNOSIS — Z8249 Family history of ischemic heart disease and other diseases of the circulatory system: Secondary | ICD-10-CM | POA: Diagnosis not present

## 2018-08-30 DIAGNOSIS — W19XXXA Unspecified fall, initial encounter: Secondary | ICD-10-CM | POA: Diagnosis not present

## 2018-08-30 DIAGNOSIS — L8990 Pressure ulcer of unspecified site, unspecified stage: Secondary | ICD-10-CM

## 2018-08-30 DIAGNOSIS — Z781 Physical restraint status: Secondary | ICD-10-CM

## 2018-08-30 DIAGNOSIS — K219 Gastro-esophageal reflux disease without esophagitis: Secondary | ICD-10-CM | POA: Diagnosis present

## 2018-08-30 DIAGNOSIS — I4510 Unspecified right bundle-branch block: Secondary | ICD-10-CM | POA: Diagnosis present

## 2018-08-30 DIAGNOSIS — I340 Nonrheumatic mitral (valve) insufficiency: Secondary | ICD-10-CM | POA: Diagnosis not present

## 2018-08-30 DIAGNOSIS — E785 Hyperlipidemia, unspecified: Secondary | ICD-10-CM | POA: Diagnosis present

## 2018-08-30 DIAGNOSIS — I69318 Other symptoms and signs involving cognitive functions following cerebral infarction: Secondary | ICD-10-CM | POA: Diagnosis not present

## 2018-08-30 DIAGNOSIS — J449 Chronic obstructive pulmonary disease, unspecified: Secondary | ICD-10-CM | POA: Diagnosis present

## 2018-08-30 DIAGNOSIS — F05 Delirium due to known physiological condition: Secondary | ICD-10-CM | POA: Diagnosis not present

## 2018-08-30 DIAGNOSIS — R2689 Other abnormalities of gait and mobility: Secondary | ICD-10-CM | POA: Diagnosis not present

## 2018-08-30 DIAGNOSIS — R443 Hallucinations, unspecified: Secondary | ICD-10-CM | POA: Diagnosis not present

## 2018-08-30 DIAGNOSIS — K118 Other diseases of salivary glands: Secondary | ICD-10-CM | POA: Diagnosis present

## 2018-08-30 DIAGNOSIS — I208 Other forms of angina pectoris: Secondary | ICD-10-CM

## 2018-08-30 DIAGNOSIS — G479 Sleep disorder, unspecified: Secondary | ICD-10-CM | POA: Diagnosis not present

## 2018-08-30 DIAGNOSIS — Z7982 Long term (current) use of aspirin: Secondary | ICD-10-CM | POA: Diagnosis not present

## 2018-08-30 DIAGNOSIS — R9431 Abnormal electrocardiogram [ECG] [EKG]: Secondary | ICD-10-CM | POA: Diagnosis not present

## 2018-08-30 DIAGNOSIS — I615 Nontraumatic intracerebral hemorrhage, intraventricular: Secondary | ICD-10-CM | POA: Diagnosis not present

## 2018-08-30 DIAGNOSIS — I6200 Nontraumatic subdural hemorrhage, unspecified: Secondary | ICD-10-CM | POA: Diagnosis not present

## 2018-08-30 DIAGNOSIS — I6783 Posterior reversible encephalopathy syndrome: Secondary | ICD-10-CM | POA: Diagnosis present

## 2018-08-30 DIAGNOSIS — E1165 Type 2 diabetes mellitus with hyperglycemia: Secondary | ICD-10-CM | POA: Diagnosis present

## 2018-08-30 DIAGNOSIS — Z9989 Dependence on other enabling machines and devices: Secondary | ICD-10-CM

## 2018-08-30 DIAGNOSIS — I69319 Unspecified symptoms and signs involving cognitive functions following cerebral infarction: Secondary | ICD-10-CM | POA: Diagnosis not present

## 2018-08-30 DIAGNOSIS — Z801 Family history of malignant neoplasm of trachea, bronchus and lung: Secondary | ICD-10-CM

## 2018-08-30 DIAGNOSIS — R7309 Other abnormal glucose: Secondary | ICD-10-CM | POA: Diagnosis not present

## 2018-08-30 DIAGNOSIS — R4182 Altered mental status, unspecified: Secondary | ICD-10-CM | POA: Diagnosis present

## 2018-08-30 DIAGNOSIS — I619 Nontraumatic intracerebral hemorrhage, unspecified: Secondary | ICD-10-CM

## 2018-08-30 DIAGNOSIS — E1169 Type 2 diabetes mellitus with other specified complication: Secondary | ICD-10-CM | POA: Diagnosis not present

## 2018-08-30 LAB — CK TOTAL AND CKMB (NOT AT ARMC)
CK, MB: 1.4 ng/mL (ref 0.5–5.0)
Relative Index: INVALID (ref 0.0–2.5)
Total CK: 29 U/L — ABNORMAL LOW (ref 49–397)

## 2018-08-30 LAB — CBC
HCT: 39.6 % (ref 39.0–52.0)
Hemoglobin: 12.7 g/dL — ABNORMAL LOW (ref 13.0–17.0)
MCH: 28.8 pg (ref 26.0–34.0)
MCHC: 32.1 g/dL (ref 30.0–36.0)
MCV: 89.8 fL (ref 80.0–100.0)
Platelets: 343 10*3/uL (ref 150–400)
RBC: 4.41 MIL/uL (ref 4.22–5.81)
RDW: 14.6 % (ref 11.5–15.5)
WBC: 10.5 10*3/uL (ref 4.0–10.5)
nRBC: 0 % (ref 0.0–0.2)

## 2018-08-30 LAB — BASIC METABOLIC PANEL
Anion gap: 10 (ref 5–15)
BUN: 16 mg/dL (ref 8–23)
CO2: 21 mmol/L — ABNORMAL LOW (ref 22–32)
Calcium: 9.1 mg/dL (ref 8.9–10.3)
Chloride: 106 mmol/L (ref 98–111)
Creatinine, Ser: 0.95 mg/dL (ref 0.61–1.24)
GFR calc Af Amer: >60 mL/min (ref >60)
GFR calc non Af Amer: >60 mL/min (ref >60)
Glucose, Bld: 334 mg/dL — ABNORMAL HIGH (ref 70–99)
Potassium: 4.6 mmol/L (ref 3.5–5.1)
SODIUM: 137 mmol/L (ref 135–145)

## 2018-08-30 LAB — GLUCOSE, CAPILLARY
Glucose-Capillary: 175 mg/dL — ABNORMAL HIGH (ref 70–99)
Glucose-Capillary: 328 mg/dL — ABNORMAL HIGH (ref 70–99)
Glucose-Capillary: 271 mg/dL — ABNORMAL HIGH (ref 70–99)
Glucose-Capillary: 110 mg/dL — ABNORMAL HIGH (ref 70–99)
Glucose-Capillary: 153 mg/dL — ABNORMAL HIGH (ref 70–99)

## 2018-08-30 LAB — ECHOCARDIOGRAM COMPLETE

## 2018-08-30 LAB — TROPONIN I

## 2018-08-30 LAB — TSH: TSH: 0.763 u[IU]/mL (ref 0.350–4.500)

## 2018-08-30 MED ORDER — IOHEXOL 300 MG/ML  SOLN
75.0000 mL | Freq: Once | INTRAMUSCULAR | Status: AC | PRN
  Administered 2018-08-30: 75 mL via INTRAVENOUS

## 2018-08-30 MED ORDER — ENOXAPARIN SODIUM 40 MG/0.4ML ~~LOC~~ SOLN: 40.0000 mg | SUBCUTANEOUS | Status: DC

## 2018-08-30 MED ORDER — METOPROLOL TARTRATE 12.5 MG HALF TABLET
50.0000 mg | ORAL_TABLET | Freq: Two times a day (BID) | ORAL | Status: DC
  Filled 2018-08-30: qty 4

## 2018-08-30 MED ORDER — ADENOSINE 6 MG/2ML IV SOLN
6.0000 mg | Freq: Once | INTRAVENOUS | Status: AC
  Administered 2018-08-30: 6 mg via INTRAVENOUS
  Filled 2018-08-30: qty 2

## 2018-08-30 MED ORDER — OLANZAPINE 5 MG PO TBDP
5.0000 mg | ORAL_TABLET | Freq: Four times a day (QID) | ORAL | Status: DC | PRN
  Administered 2018-08-30 – 2018-09-02 (×3): 5 mg via ORAL
  Filled 2018-08-30 (×4): qty 1

## 2018-08-30 MED ORDER — QUETIAPINE FUMARATE 25 MG PO TABS
12.5000 mg | ORAL_TABLET | Freq: Every day | ORAL | Status: DC
  Administered 2018-09-01 – 2018-09-04 (×4): 12.5 mg via ORAL
  Filled 2018-08-30 (×4): qty 1

## 2018-08-30 MED ORDER — METOPROLOL TARTRATE 5 MG/5ML IV SOLN
5.0000 mg | Freq: Four times a day (QID) | INTRAVENOUS | Status: AC
  Administered 2018-08-30 – 2018-08-31 (×3): 5 mg via INTRAVENOUS
  Filled 2018-08-30 (×3): qty 5

## 2018-08-30 MED ORDER — SODIUM CHLORIDE 0.9% FLUSH
3.0000 mL | Freq: Two times a day (BID) | INTRAVENOUS | Status: DC
  Administered 2018-08-30 – 2018-09-04 (×8): 3 mL via INTRAVENOUS

## 2018-08-30 MED ORDER — AMLODIPINE BESYLATE 10 MG PO TABS
10.0000 mg | ORAL_TABLET | Freq: Every day | ORAL | Status: DC
  Administered 2018-09-01: 10 mg via ORAL
  Filled 2018-08-30: qty 1

## 2018-08-30 MED ORDER — PANTOPRAZOLE SODIUM 40 MG PO TBEC
40.0000 mg | DELAYED_RELEASE_TABLET | Freq: Every day | ORAL | Status: DC
  Administered 2018-09-01 – 2018-09-04 (×4): 40 mg via ORAL
  Filled 2018-08-30 (×4): qty 1

## 2018-08-30 MED ORDER — ONDANSETRON HCL 4 MG PO TABS: 4.0000 mg | ORAL_TABLET | Freq: Four times a day (QID) | ORAL | Status: DC | PRN

## 2018-08-30 MED ORDER — ACETAMINOPHEN 325 MG PO TABS
650.0000 mg | ORAL_TABLET | Freq: Four times a day (QID) | ORAL | Status: DC | PRN
  Administered 2018-09-01: 650 mg via ORAL
  Filled 2018-08-30: qty 2

## 2018-08-30 MED ORDER — INSULIN ASPART 100 UNIT/ML ~~LOC~~ SOLN: 0.0000 [IU] | Freq: Every day | SUBCUTANEOUS | Status: DC

## 2018-08-30 MED ORDER — METOPROLOL TARTRATE 5 MG/5ML IV SOLN
5.0000 mg | Freq: Once | INTRAVENOUS | Status: AC
  Administered 2018-08-30: 5 mg via INTRAVENOUS
  Filled 2018-08-30: qty 5

## 2018-08-30 MED ORDER — LISINOPRIL 20 MG PO TABS
20.0000 mg | ORAL_TABLET | Freq: Every day | ORAL | Status: DC
  Administered 2018-09-01 – 2018-09-04 (×4): 20 mg via ORAL
  Filled 2018-08-30 (×4): qty 1

## 2018-08-30 MED ORDER — DOCUSATE SODIUM 100 MG PO CAPS
100.0000 mg | ORAL_CAPSULE | Freq: Two times a day (BID) | ORAL | Status: DC
  Administered 2018-09-01 – 2018-09-04 (×6): 100 mg via ORAL
  Filled 2018-08-30 (×6): qty 1

## 2018-08-30 MED ORDER — METOPROLOL TARTRATE 5 MG/5ML IV SOLN
5.0000 mg | INTRAVENOUS | Status: DC
  Filled 2018-08-30: qty 5

## 2018-08-30 MED ORDER — SODIUM CHLORIDE 0.9 % IV SOLN
INTRAVENOUS | Status: DC
  Administered 2018-08-30 – 2018-08-31 (×3): via INTRAVENOUS

## 2018-08-30 MED ORDER — ATORVASTATIN CALCIUM 40 MG PO TABS
40.0000 mg | ORAL_TABLET | Freq: Every day | ORAL | Status: DC
  Administered 2018-09-01 – 2018-09-04 (×4): 40 mg via ORAL
  Filled 2018-08-30 (×4): qty 1

## 2018-08-30 MED ORDER — METOPROLOL SUCCINATE ER 50 MG PO TB24: 50.0000 mg | ORAL_TABLET | Freq: Every day | ORAL | Status: DC

## 2018-08-30 MED ORDER — ACETAMINOPHEN 650 MG RE SUPP: 650.0000 mg | Freq: Four times a day (QID) | RECTAL | Status: DC | PRN

## 2018-08-30 MED ORDER — QUETIAPINE FUMARATE 25 MG PO TABS
50.0000 mg | ORAL_TABLET | Freq: Every day | ORAL | Status: DC
  Administered 2018-09-01 – 2018-09-03 (×3): 50 mg via ORAL
  Filled 2018-08-30 (×3): qty 2

## 2018-08-30 MED ORDER — ASPIRIN 81 MG PO CHEW
81.0000 mg | CHEWABLE_TABLET | Freq: Every day | ORAL | Status: DC
  Administered 2018-09-01 – 2018-09-04 (×4): 81 mg via ORAL
  Filled 2018-08-30 (×4): qty 1

## 2018-08-30 MED ORDER — ADENOSINE 6 MG/2ML IV SOLN
12.0000 mg | Freq: Once | INTRAVENOUS | Status: DC
  Filled 2018-08-30: qty 4

## 2018-08-30 MED ORDER — SODIUM CHLORIDE 0.45 % NICU IV INFUSION SIMPLE
INJECTION | INTRAVENOUS | Status: DC
  Administered 2018-08-30: 75 mL/h via INTRAVENOUS
  Filled 2018-08-30 (×2): qty 500

## 2018-08-30 MED ORDER — INSULIN ASPART 100 UNIT/ML ~~LOC~~ SOLN
0.0000 [IU] | Freq: Three times a day (TID) | SUBCUTANEOUS | Status: DC
  Administered 2018-09-01: 5 [IU] via SUBCUTANEOUS

## 2018-08-30 MED ORDER — OLANZAPINE 5 MG PO TBDP: 5.0000 mg | ORAL_TABLET | Freq: Two times a day (BID) | ORAL | Status: DC | PRN

## 2018-08-30 MED ORDER — ONDANSETRON HCL 4 MG/2ML IJ SOLN: 4.0000 mg | Freq: Four times a day (QID) | INTRAMUSCULAR | Status: DC | PRN

## 2018-08-30 MED ORDER — METOPROLOL TARTRATE 50 MG PO TABS
50.0000 mg | ORAL_TABLET | Freq: Two times a day (BID) | ORAL | Status: DC
  Administered 2018-08-30: 50 mg via ORAL
  Filled 2018-08-30: qty 1

## 2018-08-30 MED ORDER — LISINOPRIL-HYDROCHLOROTHIAZIDE 20-12.5 MG PO TABS: 1.0000 | ORAL_TABLET | Freq: Two times a day (BID) | ORAL | Status: DC

## 2018-08-30 MED ORDER — HYDROCHLOROTHIAZIDE 12.5 MG PO CAPS
12.5000 mg | ORAL_CAPSULE | Freq: Every day | ORAL | Status: DC
  Administered 2018-09-01 – 2018-09-04 (×4): 12.5 mg via ORAL
  Filled 2018-08-30 (×4): qty 1

## 2018-08-30 NOTE — Consult Note (Signed)
Cardiology Consultation:   Patient ID: Jason Farrell. MRN: [id number]; DOB: February 01, 1941  Admit date: 08/28/2018 Date of Consult: 08/30/2018  Primary Care Provider: Steele Sizer, MD Primary Cardiologist: No primary care provider on file.  Primary Electrophysiologist:  None    Patient Profile:   Jason Farrell. is a 78 y.o. male with a hx of CAD status post PCI, hyperlipidemia, SVT, diabetes, hypertension, COPD, prior tobacco abuse, and morbid obesity who is being seen today for the evaluation of SVT at the request of Dr. Tessa Lerner .  History of Present Illness:   [name] was admitted to [facility] inpatient rehab 2/13.  He had a meningioma with post-op IVH and SDH.  Nurse reports that he developed shortness of breath when dressing this AM.  Vital signs revealed that he was tachycardic in the 140s to 150s.  He had an EKG that showed SVT with a right bundle branch block.  Cardiology was consulted for further evaluation.  Mr. Standish has been confused and hallucinating since admission.  He has been in a canopy bed because he has been reaching for things.  He reports that he felt like he was having a heart attack this morning.  He also reports some shortness of breath that is mild.  He denies any lower extremity edema, orthopnea, or PND.  He notes that his heart is racing.  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  He then had PCI of 2 vessels in 2006 due to in-stent restenosis.  His last echo in 2006 revealed normal systolic function.  He last saw Dr. Rockey Situ 10/2017.  At that time he was doing well.  [name] remained hemodynamically stable throughout. He received a dose of metoprolol 5mg  IV x1 with a decrease in his HR from the 150-160s to the 130s-140s.  He was then given adenosine 6mg  x1.  Underlying rhythm was sinus.  He then converted to sinus tachycardia in the 100s.   Past Medical History:  Diagnosis Date  . Coronary artery disease   . Diabetes mellitus without  complication ([place])   . Dyslipidemia   . GERD (gastroesophageal reflux disease)   . Hyperlipidemia   . Hypertension   . MI (myocardial infarction) ([place])   . Osteoarthritis     Past Surgical History:  Procedure Laterality Date  . CARDIAC CATHETERIZATION  2006   stent placement  . COLONOSCOPY    . CORONARY ANGIOPLASTY WITH STENT PLACEMENT  2006   stent x 2   . CORONARY ANGIOPLASTY WITH STENT PLACEMENT  1996   stent x 3   . FOOT SURGERY Right   . HALLUX VALGUS CHEILECTOMY Right 02/25/2015   Procedure: HALLUX VALGUS CHEILECTOMY;  Surgeon: [name], DPM;  Location: [facility] ORS;  Service: Podiatry;  Laterality: Right;     Home Medications:  Prior to Admission medications   Medication Sig Start Date End Date Taking? Authorizing Provider  amLODipine (NORVASC) 10 MG tablet Take 1 tablet (10 mg total) by mouth daily. 02/21/18  Yes Steele Sizer, MD  aspirin 81 MG tablet Take 81 mg by mouth daily.   Yes [provider]  atorvastatin (LIPITOR) 40 MG tablet Take 1 tablet (40 mg total) by mouth daily. 02/21/18  Yes Sowles, Drue Stager, MD  Coenzyme Q10 (CO Q10) 200 MG CAPS Take 1 capsule by mouth daily.   Yes [provider]  docusate sodium (COLACE) 100 MG capsule Take 100 mg by mouth 2 (two) times daily.   Yes [provider]  enoxaparin (LOVENOX) 40 MG/0.4ML injection Inject 40 mg into the skin daily.   Yes [provider]  glipiZIDE (GLUCOTROL) 5 MG tablet Take 1 tablet (5 mg total) by mouth 2 (two) times daily before a meal. 07/04/18  Yes Sowles, Drue Stager, MD  lisinopril-hydrochlorothiazide (ZESTORETIC) 20-12.5 MG tablet Take 1 tablet by mouth 2 (two) times daily. 02/21/18  Yes Sowles, Drue Stager, MD  metFORMIN (GLUCOPHAGE-XR) 750 MG 24 hr tablet Take 2 tablets (1,500 mg total) by mouth daily with breakfast. 02/21/18  Yes Sowles, Drue Stager, MD  metoprolol succinate (TOPROL-XL) 50 MG 24 hr tablet Take 1 tablet (50 mg total) by mouth daily. Take with or immediately  following a meal. 07/04/18  Yes Sowles, Drue Stager, MD  Multiple Vitamins-Minerals (CENTRUM SILVER PO) Take 1 tablet by mouth daily.    Yes [provider]  OLANZapine zydis (ZYPREXA) 5 MG disintegrating tablet Take 5 mg by mouth every 12 (twelve) hours as needed (agitation).   Yes [provider]  Omega-3 Fatty Acids (FISH OIL) 1200 MG CAPS Take 1,200 mg by mouth daily.   Yes [provider]  omeprazole (PRILOSEC) 40 MG capsule Take 1 capsule (40 mg total) by mouth daily. 02/21/18  Yes Sowles, Drue Stager, MD  QUEtiapine (SEROQUEL) 25 MG tablet Take 12.5 mg by mouth daily.   Yes [provider]  QUEtiapine (SEROQUEL) 50 MG tablet Take 50 mg by mouth at bedtime.   Yes [provider]  sitaGLIPtin (JANUVIA) 100 MG tablet Take 1 tablet (100 mg total) by mouth daily. 07/04/18  Yes Sowles, Drue Stager, MD  Vitamin D, Cholecalciferol, 1000 units TABS Take 1,000 units of lipase by mouth daily.    Yes [provider]  glucose blood test strip Use as instructed 08/06/18   Steele Sizer, MD  Influenza vac split quadrivalent PF (FLUZONE HIGH-DOSE) 0.5 ML injection Fluzone High-Dose 2019-20 (PF) 180 mcg/0.5 mL intramuscular syringe  ADM 0.5ML IM UTD    [provider]    Inpatient Medications: Scheduled Meds: . aspirin EC  81 mg Oral Daily  . enoxaparin (LOVENOX) injection  40 mg Subcutaneous Q24H  . feeding supplement (GLUCERNA SHAKE)  237 mL Oral BID BM  . feeding supplement (PRO-STAT SUGAR FREE 64)  30 mL Oral BID  . insulin aspart  0-5 Units Subcutaneous QHS  . insulin aspart  0-9 Units Subcutaneous TID WC  . metoprolol tartrate  5 mg Intravenous Once  . metoprolol tartrate  25 mg Oral TID WC & HS  . pantoprazole  40 mg Oral Daily  . potassium chloride  20 mEq Oral BID  . QUEtiapine  50 mg Oral QHS   Continuous Infusions: . sodium chloride 0.45 %     PRN Meds: acetaminophen, alum & mag hydroxide-simeth, bisacodyl, diphenhydrAMINE,  guaiFENesin-dextromethorphan, OLANZapine, polyethylene glycol, prochlorperazine **OR** prochlorperazine **OR** prochlorperazine, sodium phosphate, traZODone  Allergies:   No Known Allergies  Social History:   Social History   Socioeconomic History  . Marital status: Married    Spouse name: Manuela Schwartz  . Number of children: 3  . Years of education: some college  . Highest education level: 12th grade  Occupational History  . Occupation: Retired  Scientific laboratory technician  . Financial resource strain: Not hard at all  . Food insecurity:    Worry: Never true    Inability: Never true  . Transportation needs:    Medical: No    Non-medical: No  Tobacco Use  . Smoking status: Former Smoker    Packs/day: 1.50  Years: 35.00    Pack years: 52.50    Types: Cigarettes    Start date: 07/17/1959    Last attempt to quit: 08/27/1994    Years since quitting: 24.0  . Smokeless tobacco: Never Used  . Tobacco comment: smoking cessation materials not required  Substance and Sexual Activity  . Alcohol use: Yes    Alcohol/week: 14.0 standard drinks    Types: 14 Glasses of wine per week  . Drug use: No  . Sexual activity: Not Currently    Partners: Female  Lifestyle  . Physical activity:    Days per week: 3 days    Minutes per session: 30 min  . Stress: Not at all  Relationships  . Social connections:    Talks on phone: Once a week    Gets together: Never    Attends religious service: Never    Active member of club or organization: No    Attends meetings of clubs or organizations: Never    Relationship status: Married  . Intimate partner violence:    Fear of current or ex partner: No    Emotionally abused: No    Physically abused: No    Forced sexual activity: No  Other Topics Concern  . Not on file  Social History Narrative  . Not on file    Family History:    Family History  Problem Relation Age of Onset  . Hyperlipidemia Father   . Hypertension Father   . Alcohol abuse Father   . Cancer  Sister        lung  . Hypertension Sister   . Heart disease Sister   . Hyperlipidemia Sister   . COPD Sister      ROS:  Please see the history of present illness.   All other ROS reviewed and negative.     Physical Exam/Data:   Vitals:   08/29/18 0618 08/29/18 1924 08/30/18 0505 08/30/18 0913  BP: (!) 155/72 (!) 155/97 (!) 165/86 129/75  Pulse: 80 91 97 (!) 165  Resp: 17 20 20    Temp: 97.9 F (36.6 C) 99.3 F (37.4 C) 98.7 F (37.1 C) 97.8 F (36.6 C)  TempSrc: Oral Oral Oral Oral  SpO2: 100% 100% 100% 100%  Weight:      Height:        Intake/Output Summary (Last 24 hours) at 08/30/2018 0944 Last data filed at 08/30/2018 0830 Gross per 24 hour  Intake 958 ml  Output 410 ml  Net 548 ml   Last 3 Weights 08/28/2018 07/21/2018 07/21/2018  Weight (lbs) 201 lb 4.5 oz 220 lb 221 lb 11.2 oz  Weight (kg) 91.3 kg 99.791 kg 100.562 kg     VS:  BP 129/75 (BP Location: Right Arm)   Pulse (!) 165   Temp 97.8 F (36.6 C) (Oral)   Resp 20   Ht 5\' 6"  (1.676 m)   Wt 91.3 kg   SpO2 100%   BMI 32.49 kg/m  , BMI Body mass index is 32.49 kg/m. GENERAL: Chronically ill-appearing HEENT: Pupils equal round and reactive, fundi not visualized, oral mucosa unremarkable NECK:  No jugular venous distention, waveform within normal limits, carotid upstroke brisk and symmetric, no bruits LUNGS:  Clear to auscultation bilaterally HEART: Tachycardic.  Regular rhythm.  PMI not displaced or sustained,S1 and S2 within normal limits, no S3, no S4, no clicks, no rubs, no murmurs ABD:  Flat, positive bowel sounds normal in frequency in pitch, no bruits, no rebound, no guarding, no  midline pulsatile mass, no hepatomegaly, no splenomegaly EXT:  2 plus pulses throughout, no edema, no cyanosis no clubbing SKIN:  No rashes no nodules NEURO: Moves all 4 extremities freely. PSYCH: Intermittently answers questions appropriately.  Confused and reaching to put his medications in his mouth, though there is  nothing in his hands.  Also calling out for a son who he states is in the room.  His son is not in the room.  EKG:  The EKG was personally reviewed and demonstrates: SVT.  Rate 163 bpm Telemetry:  Telemetry was personally reviewed and demonstrates:  n/a  Relevant CV Studies:  Echo 09/2004: LVEF greater than 65%.  Trivial TR.   Laboratory Data:  Chemistry Recent Labs  Lab 08/29/18 0659  NA 140  K 4.0  CL 106  CO2 23  GLUCOSE 184*  BUN 8  CREATININE 0.69  CALCIUM 8.6*  GFRNONAA >60  GFRAA >60  ANIONGAP 11    Recent Labs  Lab 08/29/18 0659  PROT 5.4*  ALBUMIN 2.4*  AST 21  ALT 34  ALKPHOS 68  BILITOT 0.7   Hematology Recent Labs  Lab 08/29/18 0659  WBC 11.0*  RBC 4.06*  HGB 12.0*  HCT 36.1*  MCV 88.9  MCH 29.6  MCHC 33.2  RDW 14.3  PLT 298   Cardiac EnzymesNo results for input(s): TROPONINI in the last 168 hours. No results for input(s): TROPIPOC in the last 168 hours.  BNPNo results for input(s): BNP, PROBNP in the last 168 hours.  DDimer No results for input(s): DDIMER in the last 168 hours.  Radiology/Studies:  Dg Chest Port 1 View  Result Date: 08/28/2018 CLINICAL DATA:  Cough. EXAM: PORTABLE CHEST 1 VIEW COMPARISON:  07/21/2018 FINDINGS: The heart is within normal limits in size and stable. There is tortuosity and calcification of the thoracic aorta. Chronic bronchitic lung changes but no definite infiltrates, effusions or edema. The bony structures are intact. Stable degenerative changes involving both shoulders. IMPRESSION: No acute cardiopulmonary findings. Electronically Signed   By: Marijo Sanes M.D.   On: 08/28/2018 19:42    Assessment and Plan:   # SVT: Initial EKG concerning for atrial flutter.  However, on bedside monitor his rhythm was more consistent with SVT.  It did not break with vagal maneuvers or metoprolol.  After receiving adenosine the underlying rhythm was clearly sinus.  He converted to sinus tachycardia in the 100s.  We will  increase his metoprolol to 50 mg twice daily.  Echocardiogram has been ordered.  He has a known history of SVT.  Even if this were atrial flutter, he would not be a candidate for systemic anticoagulation due to his intracranial bleeding.  There have been no ischemic changes on EKG, though he did complain of some vague chest discomfort.  Troponin is pending.  Echocardiogram also pending.  BMP and CMP also pending.  Will check thyroid function.  Transfer to telemetry.  # CAD: # Hyperlipidemia: S/p multiple PCIs.  Checking troponin today.  I do not think this current event is ischemia mediated.  Continue aspirin.  He is not on a statin.  We will check lipids.  # Hypertension: Increase metoprolol as above for SVT.      For questions or updates, please contact [place] Please consult [email or web address] for contact info under     Signed, Skeet Latch, MD  08/30/2018 9:44 AM\

## 2018-08-30 NOTE — Progress Notes (Addendum)
Requested stroke swallow screen from RR RN, Per RR-RN bedside-screening is out of scope due to 'new admission' not a result of stroke, regardless of MD Order. Per ST, they can do the evaluation tomorrow as they are not available at [facility] today after 3 PM.   Will request order change from Dr Lorin Mercy, as well as orders for IV rx modifications as pt will remain NPO until ST can perform evaluation tomorrow.    Page to MD   3e05 Jason Farrell: Tachy 130-140, no IV rx; NPO pending Swallow Eval on Sunday. Please call.  Notified of effort to convert by vagal maneuver  Orders received for lopressor IV and ODT Xyprexa; requested adjustment of swallow eval order.

## 2018-08-30 NOTE — Progress Notes (Signed)
[place] PHYSICAL MEDICINE & REHABILITATION PROGRESS NOTE  Subjective/Complaints: Pt with uneventful night. Was resting well, arousable when I saw him around 0700. Nurse just called me back to see him wi  Objective: Vital Signs: Blood pressure 129/75, pulse (!) 165, temperature 97.8 F (36.6 C), temperature source Oral, resp. rate 20, height 5\' 6"  (1.676 m), weight 91.3 kg, SpO2 100 %. Dg Chest Port 1 View  Result Date: 08/28/2018 CLINICAL DATA:  Cough. EXAM: PORTABLE CHEST 1 VIEW COMPARISON:  07/21/2018 FINDINGS: The heart is within normal limits in size and stable. There is tortuosity and calcification of the thoracic aorta. Chronic bronchitic lung changes but no definite infiltrates, effusions or edema. The bony structures are intact. Stable degenerative changes involving both shoulders. IMPRESSION: No acute cardiopulmonary findings. Electronically Signed   By: Marijo Sanes M.D.   On: 08/28/2018 19:42   Recent Labs    08/29/18 0659  WBC 11.0*  HGB 12.0*  HCT 36.1*  PLT 298   Recent Labs    08/29/18 0659  NA 140  K 4.0  CL 106  CO2 23  GLUCOSE 184*  BUN 8  CREATININE 0.69  CALCIUM 8.6*    Physical Exam: BP 129/75 (BP Location: Right Arm)   Pulse (!) 165   Temp 97.8 F (36.6 C) (Oral)   Resp 20   Ht 5\' 6"  (1.676 m)   Wt 91.3 kg   SpO2 100%   BMI 32.49 kg/m  Constitutional: No distress . Vital signs reviewed. HEENT: EOMI, oral membranes moist Neck: supple Cardiovascular: tachy 160+,     Respiratory: clear, does no appear dyspneic.    GI: BS +, non-tender, non-distended  Musc: No edema or tenderness in extremities. Neuro: Somnolent and oriented x1 Motor: Appears to be grossly 4+/5 throughout  Skin: Scab on lip Psych: sl confused but otherwise appropriate this morning.  Assessment/Plan: 1. Functional deficits secondary to meningioma with subsequent IVH and SDH status post craniotomy which require 3+ hours per day of interdisciplinary therapy in a  comprehensive inpatient rehab setting.  Physiatrist is providing close team supervision and 24 hour management of active medical problems listed below.  Physiatrist and rehab team continue to assess barriers to discharge/monitor patient progress toward functional and medical goals  Care Tool:  Bathing        Body parts bathed by helper: Right arm, Left arm, Face Body parts n/a: Front perineal area, Buttocks, Left upper leg, Right upper leg, Right lower leg, Left lower leg, Abdomen, Chest   Bathing assist Assist Level: Maximal Assistance - Patient 24 - 49%     Upper Body Dressing/Undressing Upper body dressing   What is the patient wearing?: Pull over shirt    Upper body assist Assist Level: Maximal Assistance - Patient 25 - 49%    Lower Body Dressing/Undressing Lower body dressing      What is the patient wearing?: Incontinence brief, Pants     Lower body assist Assist for lower body dressing: Maximal Assistance - Patient 25 - 49%     Toileting Toileting    Toileting assist Assist for toileting: 2 Helpers     Transfers Chair/bed transfer  Transfers assist     Chair/bed transfer assist level: Moderate Assistance - Patient 50 - 74%     Locomotion Ambulation   Ambulation assist      Assist level: 2 helpers(mod assist + w/c follow for safety)   Max distance: 25 ft    Walk 10 feet activity   Assist  Assist level: 2 helpers(mod assist + w/c follow for safety)     Walk 50 feet activity   Assist Walk 50 feet with 2 turns activity did not occur: Safety/medical concerns         Walk 150 feet activity   Assist Walk 150 feet activity did not occur: Safety/medical concerns         Walk 10 feet on uneven surface  activity   Assist Walk 10 feet on uneven surfaces activity did not occur: Safety/medical concerns         Wheelchair     Assist Will patient use wheelchair at discharge?: Yes Type of Wheelchair: Manual     Wheelchair assist level: Moderate Assistance - Patient 50 - 74% Max wheelchair distance: 100 ft(linear hallway only)    Wheelchair 50 feet with 2 turns activity    Assist    Wheelchair 50 feet with 2 turns activity did not occur: Safety/medical concerns       Wheelchair 150 feet activity     Assist Wheelchair 150 feet activity did not occur: Safety/medical concerns          Medical Problem List and Plan: 1.Decline in mobility and self caresecondary toMeningioma with post operative IVH and SDH  -therapies on hold this morning given tachycardia 2. DVT Prophylaxis/Anticoagulation: Pharmaceutical:Lovenox 3. Pain Management:Tylenol prn 4. Mood:LCSW to follow for evaluation and support. 5. Neuropsych: This patientis not fullycapable of making decisions onhisown behalf.  Continue tele-sitter and enclosure bed for safety 6. Skin/Wound Care:Routine pressure relief measures. 7. Fluids/Electrolytes/Nutrition:Monitor I/O.  8. T2DM: Was on Januvia, metformin and glucotrol PTA--Monitor BS ac/hs. Will use SSI for elevated BS.  Monitor with increased mobility 9. HTN: Monitor BP bid. Controlled without medications at this time.-  Monitor with increased mobility 10.SVT: up to 160's this morning whereas it had been controlled previously.   -EKG with wide qrs tachycardia of 160+  -stat labs  -received lopressor this am. Ordered 5mg  IV lopressor now  -iv/ivf  -cardiology contacted and will be seeing 11. OSA: Continue to encourage CPAP use.  12.CAD: Stable. Continue low dose ASA and BB. 13. Delirium/encephalopathy;Resolving.  .On Seroquel at nights.Sleep wake chart to monitor sleep pattern. Frequent checks and Telesitter for safety. 14. Leucocytosis:   WBCs 11.0 on 2/14  Afebrile  Labs tdoay 15. Hypokalemia:  Added supplement. Due to recent SVT likely need to keep K>/+ 4.0.   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  Hypoalbuminemia  Supplement initiated on 2/14 18.  Acute blood loss anemia  Hemoglobin 12.0 on 2/14  Continue monitor  LOS: 2 days A FACE TO FACE EVALUATION WAS PERFORMED  Meredith Staggers 08/30/2018, 9:20 AM

## 2018-08-30 NOTE — Progress Notes (Signed)
Patient toileted after breakfast with therapy. After BM/void, transferred back to wheelchair and complained of nausea and chest pain. Vital signs showed heart rate in 160s. Provider notified, EKG done. Cardiac MD assessed and RR called to assist. Monitored in room by RN, heart rate in 80s-90s, no further chest pain, no acute distress. Patient fidgeting and pulling at lines and O2 but redirectable. Family notified and then arrived at bedside. Patient transferred to [facility] with SWOT nurse accompanying. Family packed belongings.

## 2018-08-30 NOTE — IPOC Note (Signed)
No IPOC due to shortened stay

## 2018-08-30 NOTE — Progress Notes (Signed)
Occupational Therapy Session Note  Patient Details  Name: Jason Farrell. MRN: [id number] Date of Birth: Aug 10, 1940  Today's Date: 08/30/2018 OT Missed Time: 75 Minutes Missed Time Reason: Patient ill (comment);Nursing care   Short Term Goals: Week 1:  OT Short Term Goal 1 (Week 1): Pt will locate 1 grooming item on the sink with mod questioning cues OT Short Term Goal 2 (Week 1): Pt will complete toilet transfer with min A of 1 OT Short Term Goal 3 (Week 1): Pt will complete 1 step of LB dressing activity  with mod instructional cues OT Short Term Goal 4 (Week 1): Pt will complete 1 step of toothbrushing activity with mod instructional cues  Skilled Therapeutic Interventions/Progress Updates:    Pt was not able to receive therapy as he was receiving medical care for a very recent acute medical event.  Therapy Documentation Precautions:  Precautions Precautions: Fall Precaution Comments: impaired vision Restrictions Weight Bearing Restrictions: No  General OT Amount of Missed Time: 75 Minutes Vital Signs: Therapy Vitals Temp: 97.8 F (36.6 C) Temp Source: Oral Pulse Rate: (!) 165 BP: 129/75 Patient Position (if appropriate): Sitting Oxygen Therapy SpO2: 100 % O2 Device: Room Air  Therapy/Group: Individual Therapy  [place] 08/30/2018, 10:59 AM

## 2018-08-30 NOTE — Consult Note (Addendum)
NEURO HOSPITALIST CONSULT NOTE   Requestig physician: Dr.Yates   Reason for Consult: confusion   History obtained from:  Patient     HPI:                                                                                                                                          Jason Farrell. is an 78 y.o. male  With PMH CAD, DM2, HTN, OSA ( CPAP) , meningioma (s/p craniotomy 07/2018).  Patient was admitted to [facility] on 2/13 after meningioma resection w/ complications. Had SVT 160's. Family had noticed increased confusion and hallucinations. Neurology was asked for consult.  Patient was seen in the ED at [facility] 07/21/2018 for recurring headaches. At this time a CT and MRI were done and a 3cm intracranial mass  Consistent with meningioma was found. He was referred to [facility].on 08/11/2018 had a craniotomy for resection of meningioma with EVD placement. 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 2/15 patient had SVT did not convert after metoprolol. Adenosine was given and patient trasnfered from CIR to telemetry floor.   Past Medical History:  Diagnosis Date  . Coronary artery disease   . Diabetes mellitus without complication ([place])   . Dyslipidemia   . GERD (gastroesophageal reflux disease)   . Hyperlipidemia   . Hypertension   . MI (myocardial infarction) ([place])   . Osteoarthritis     Past Surgical History:  Procedure Laterality Date  . CARDIAC CATHETERIZATION  2006   stent placement  . COLONOSCOPY    . CORONARY ANGIOPLASTY WITH STENT PLACEMENT  2006   stent x 2   . CORONARY ANGIOPLASTY WITH STENT PLACEMENT  1996   stent x 3   . FOOT SURGERY Right   . HALLUX VALGUS CHEILECTOMY Right 02/25/2015   Procedure: HALLUX VALGUS CHEILECTOMY;  Surgeon: [name], DPM;  Location: [facility] ORS;  Service: Podiatry;   Laterality: Right;    Family History  Problem Relation Age of Onset  . Hyperlipidemia Father   . Hypertension Father   . Alcohol abuse Father   . Cancer Sister        lung  . Hypertension Sister   . Heart disease Sister   . Hyperlipidemia Sister   . COPD Sister            Social History:  reports that he quit smoking about 24 years ago. His smoking use included cigarettes. He started smoking about 59 years ago. He has a 52.50 pack-year smoking history. He has never used smokeless tobacco. He reports current alcohol use of about 14.0 standard drinks of alcohol per week. He reports that he  does not use drugs.  No Known Allergies  MEDICATIONS:                                                                                                                     Scheduled: . [START ON 08/31/2018] amLODipine  10 mg Oral Daily  . [START ON 08/31/2018] aspirin  81 mg Oral Daily  . [START ON 08/31/2018] atorvastatin  40 mg Oral Daily  . docusate sodium  100 mg Oral BID  . insulin aspart  0-15 Units Subcutaneous TID WC  . insulin aspart  0-5 Units Subcutaneous QHS  . [START ON 08/31/2018] lisinopril-hydrochlorothiazide  1 tablet Oral BID  . metoprolol succinate  50 mg Oral BID  . metoprolol tartrate  5 mg Intravenous Q6H  . [START ON 08/31/2018] pantoprazole  40 mg Oral Daily  . [START ON 08/31/2018] QUEtiapine  12.5 mg Oral Daily  . QUEtiapine  50 mg Oral QHS  . sodium chloride flush  3 mL Intravenous Q12H   Continuous: . sodium chloride 75 mL/hr at 08/30/18 1515   WUJ:[id number] **OR** acetaminophen, OLANZapine zydis, ondansetron **OR** ondansetron (ZOFRAN) IV   ROS:                                                                                                                                        unobtainable from patient due to mental status     There were no vitals taken for this visit.   General Examination:                                                                                                        Physical Exam  HEENT-  Normocephalic, no lesions, without obvious abnormality.  Normal external eye and conjunctiva.   Cardiovascular- S1-S2 audible, pulses palpable throughout   Lungs-no rhonchi or wheezing noted, no excessive working breathing.  Saturations within normal limits on RA Abdomen- All 4 quadrants palpated and nontender Extremities- Warm, dry and intact Musculoskeletal-no joint  tenderness, deformity or swelling Skin-warm and dry, no hyperpigmentation, vitiligo, or suspicious lesions  Neurological Examination Mental Status: Alert, oriented,name/ age/current president he did miss the month and year. thought content appropriate.  Speech fluent without evidence of aphasia.  Able to follow commands without difficulty. 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 EOEMI. V,VII: smile symmetric, facial light touch sensation normal bilaterally VIII: hearing normal bilaterally IX,X: uvula rises symmetrically XI: bilateral shoulder shrug XII: midline tongue extension Motor: Right : Upper extremity   5/5  Left:     Upper extremity   5/5  Lower extremity   5/5   Lower extremity   5/5 Tone and bulk:normal tone throughout; no atrophy noted Sensory:  light touch intact throughout, bilaterally Deep Tendon Reflexes: 2+ and symmetric throughout Plantars: Right: downgoing   Left: downgoing Cerebellar: normal finger-to-nose,  normal heel-to-shin test Gait: deferred   Lab Results: Basic Metabolic Panel: Recent Labs  Lab 08/29/18 0659 08/30/18 0935  NA 140 137  K 4.0 4.6  CL 106 106  CO2 23 21*  GLUCOSE 184* 334*  BUN 8 16  CREATININE 0.69 0.95  CALCIUM 8.6* 9.1    CBC: Recent Labs  Lab 08/29/18 0659 08/30/18 0935  WBC 11.0* 10.5  NEUTROABS 9.0*  --   HGB 12.0* 12.7*  HCT 36.1* 39.6  MCV 88.9 89.8  PLT 298 343    Cardiac Enzymes: Recent Labs  Lab  08/30/18 0935  CKTOTAL 29*  CKMB 1.4  TROPONINI <0.03    Imaging: Dg Chest Port 1 View  Result Date: 08/28/2018 CLINICAL DATA:  Cough. EXAM: PORTABLE CHEST 1 VIEW COMPARISON:  07/21/2018 FINDINGS: The heart is within normal limits in size and stable. There is tortuosity and calcification of the thoracic aorta. Chronic bronchitic lung changes but no definite infiltrates, effusions or edema. The bony structures are intact. Stable degenerative changes involving both shoulders. IMPRESSION: No acute cardiopulmonary findings. Electronically Signed   By: Marijo Sanes M.D.   On: 08/28/2018 19:42    Assessment: Jason Fan. is an 78 y.o. male  With PMH CAD, DM2, HTN, OSA ( CPAP) , meningioma (s/p craniotomy 07/2018). CTH w/wo contrast is pending. On my exam patient is not agitated and not hallucinating. He is oriented, follows commands, but he is blind. Has a sitter at bedside and is in mitten restraints.    Recommendations: - [place] W/ WO contrast   Laurey Morale, MSN, NP-C [facility] [phone]  Attending neurologist's note to follow   08/30/2018, 2:41 PM  NEUROHOSPITALIST ADDENDUM Performed a face to face diagnostic evaluation.   I have reviewed the contents of history and physical exam as documented by PA/ARNP/Resident and agree with above documentation.  I have discussed and formulated the above plan as documented. Edits to the note have been made as needed.  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  Patient underwent resection at [facility] on 1/27 with ventriculostomy catheter in the right ventricle. Postoperatively patient had some small amount of hemorrhage with intraventricular hemorrhage at the site of the tumor as well as left subdural hematoma.  Surgery patient had dysarthria which makes sense given  location of the tumor.  Postop day 2 patient complained of blurred vision and MRI brain showed bilateral occipital lobe edema thought to be related to PRES (though does not appear to be mentioned  in the report MRI brain. CTA showed focal narrowing but PCAs were patent, subsequently MRA showed possible lack of signal flow in the right P2).   The time of discharge patient has remained blind.  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  Patient transferred from [place] to rehab.  Patient developed SVT in rehab and transferred to medical floor.   Neurology was consulted for altered mental status and to assess if there is an alternative explanation to patient's mental status.  However today patient appears to be alert oriented to himself following commands and no weakness on bilateral upper and lower extremities.    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

## 2018-08-30 NOTE — Progress Notes (Signed)
Pt with recurrent SVT in 160's. Appreciate cardiology assistance. Pt converted back to sinus tach finally after adenosine. Pt transferred to telemetry bed under care of [facility] (appreciate hospitalist assistance also). Will bring back to inpatient rehab when medically stable to do so.   Meredith Staggers, MD, [facility] 08/30/2018

## 2018-08-30 NOTE — Significant Event (Signed)
Rapid Response Event Note Dr. Oval Linsey wanted RRT at bedside, HR 160's Overview: Time Called: 0959 Arrival Time: 1001 Event Type: Cardiac  Initial Focused Assessment: On arrival pt lying supine in bed, Dr. Oval Linsey at bedside, Zoll pads already applied to pt. New orders for 5 mg Metoprolol IVP given waiting to come up from pharmacy. Pt did not convert after metoprolol, 6 mg Adenosine ordered and given. Consult to [facility] with the plan to readmit to telemetry floor    Interventions: 5 mg Metoprolol IVP 6 mg Adenosine IVP EKG    Event Summary: Name of Physician Notified: DR. Oval Linsey (at bedside on my arrival) at      at          [facility], [place]

## 2018-08-30 NOTE — Progress Notes (Signed)
Per RN and OT report, patient with acute cardiac event this morning that required rapid response team. Patient now OTF and not available or appropriate for PT given the events of this morning. 60 minutes of skilled PT time lost due to acute medical status.  Deniece Ree PT, DPT, CBIS  Supplemental Physical Therapist [facility]    Pager [phone] Acute Rehab Office [phone]

## 2018-08-30 NOTE — Progress Notes (Signed)
Patient arrives to [facility] from [facility], family at bedside. Pt with mittens in place, NS infusing at 44ml/hr pt with IV access from IP rehab admission.   Spoke with Dr Lorin Mercy, orders/H&P are being entered, pt pending swallow screen; if he passes NS infusion can be canceled.

## 2018-08-30 NOTE — H&P (Addendum)
History and Physical    Jason Farrell. ZDG:[id number] DOB: April 12, 1941 DOA: 08/28/2018  PCP: Steele Sizer, MD Consultants:  Cardiology Patient coming from:  Home - lives with wife; NOK: Wife, [phone]  Chief Complaint:  Rapid heart rate  HPI: Jason Farrell. is a 78 y.o. male with medical history significant of CAD; HTN; HLD; OSA on CPAP; and DM admitted to [facility] on 2/13 with functional deficits s/p craniotomy with tumor resection at [facility] on 1/27.  There was plan to admit the patient to CIR on 2/7 but he had increased confusion and lethargy.  He was thought to have leukocytosis associated with steroids and had Seroquel and Zyprexa added.  He had SVT which resolved with addition of Lopressor.  He is on a dysphagia 3 diet with question of aspiration, but MBS was not done due to cognitive deficits and body habitus.  He was showing improvement with therapy and so was recommended to go to CIR.  He had a brain tumor.  He had the surgery at [facility] about 3 weeks ago.  Since then, he has been confused, blind, and they are still trying to work through that.  They managed to remove about 95% of the tumor.  Family thought he was doing very well yesterday.  Today, he is worse off again. Yesterday, he was still fidgeting, confusing, and seeing things not there. But he seemed more aware that family was there.  Today, he is back to being very restless and not knowing who family is.  He has never had a problem with his heart and he has had great BP control for years.  Once, while at [facility], he had an elevated HR - they watched and monitored him and it got better.  In review of [facility] notes: -Patient was admitted from 1/26-2/13 for atypical meningioma, WHO grade II. -Pre-operatively, he was having headaches and dysarthria, balance difficulty, sleep disturbance, and behavioral changes.  He developed confusion and mood lability.  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   -He  had a craniotomy with mass resection of supratentorial meningioma on 1/27  -POD 1, he was intubated on Precedex but ICP 14-15 without need to open EVD.  He was on Cardene to keep SBP <160.  He appears to have been extubated that day and was noted to have intact vision -POD 2, he becgan to c/o blurred and double vision and he was noted to have grossly intact vision but peripheral and lower vision loss.  He was cleared by SLP for a regular diet. -The EVD was removed on POD 3 with stable ICP.  Ophthalmology consulted and findings were suggestive of cortical in nature.  MRI with edema along B occipital lobes.  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  This may take several weeks to recover and patients may have only variable degree of visual recovery. -POD 4 he was A&O x 2, CT showed right IVH -POD 5 he became agitated and was not sleeping well.  He was started on Seroquel.  Steroids were increased.  Repeat head CTA with focal narrowing/diminished flow within the right V4 segment, possibly short segment dissection, thrombus, or vasospasm.  Subsequent MRA/MRV showed right P2 segment with diminished flow signal; postsurgical changes including thrombus in the right ventricle and mild dilation of both vetricles and focal hemorrhage with restricted diffusion at the resection site. -He continued with vision issues and confusion -POD 9, he had a run of vtach  with unremarkable evaluation -POD 10, his antibiotics were stopped and his exam was improving - he was able to see light without gross vision -POD 11 he developed worsening agitation and confusion. -POD 12 he had increased WBC to 30 and CXR was read as opacity vs. Atelectasis.  He had an LP and was started on Cefepime/Vanc.  He was still able to see light, perhaps a bit better.  CT appeared improved. -POD 13 he had further improved MS and was transferred out of the ICU the following day.  In the early AM, he had an  episode of SVT; his home metoprolol was resumed since this had been held. -He continued to improve and had no further SVT.  He began participating in therapy.  Prn Zyprexa was added for delirium.   -By POD 17, he was awake and oriented to person, following simple commands, tracking to voice, and named his wife.  He was thus discharged to CIR.   CIR Course:  Admitted on 2/13 after meningioma with complications from hemorrhages after surgery.  This AM, SVT with HR 160s.  Cardiology consulted and given Adenosine and Lopressor.  He needs telemetry and cardiology would request that we transfer back to the acute side.  Review of Systems: Unable to perform   Ambulatory Status:  Ambulated without assistance prior to surgery; since then, blind and ambulated with assistance yesterday      Past Medical History:  Diagnosis Date  . Coronary artery disease   . Diabetes mellitus without complication ([place])   . Dyslipidemia   . GERD (gastroesophageal reflux disease)   . Hyperlipidemia   . Hypertension   . MI (myocardial infarction) ([place])   . Osteoarthritis          Past Surgical History:  Procedure Laterality Date  . CARDIAC CATHETERIZATION  2006   stent placement  . COLONOSCOPY    . CORONARY ANGIOPLASTY WITH STENT PLACEMENT  2006   stent x 2   . CORONARY ANGIOPLASTY WITH STENT PLACEMENT  1996   stent x 3   . FOOT SURGERY Right   . HALLUX VALGUS CHEILECTOMY Right 02/25/2015   Procedure: HALLUX VALGUS CHEILECTOMY;  Surgeon: [name], DPM;  Location: [facility] ORS;  Service: Podiatry;  Laterality: Right;    Social History        Socioeconomic History  . Marital status: Married    Spouse name: Manuela Schwartz  . Number of children: 3  . Years of education: some college  . Highest education level: 12th grade  Occupational History  . Occupation: Retired  Scientific laboratory technician  . Financial resource strain: Not hard at all  . Food insecurity:    Worry: Never true     Inability: Never true  . Transportation needs:    Medical: No    Non-medical: No  Tobacco Use  . Smoking status: Former Smoker    Packs/day: 1.50    Years: 35.00    Pack years: 52.50    Types: Cigarettes    Start date: 07/17/1959    Last attempt to quit: 08/27/1994    Years since quitting: 24.0  . Smokeless tobacco: Never Used  . Tobacco comment: smoking cessation materials not required  Substance and Sexual Activity  . Alcohol use: Yes    Alcohol/week: 14.0 standard drinks    Types: 14 Glasses of wine per week  . Drug use: No  . Sexual activity: Not Currently    Partners: Female  Lifestyle  . Physical activity:    Days  per week: 3 days    Minutes per session: 30 min  . Stress: Not at all  Relationships  . Social connections:    Talks on phone: Once a week    Gets together: Never    Attends religious service: Never    Active member of club or organization: No    Attends meetings of clubs or organizations: Never    Relationship status: Married  . Intimate partner violence:    Fear of current or ex partner: No    Emotionally abused: No    Physically abused: No    Forced sexual activity: No  Other Topics Concern  . Not on file  Social History Narrative  . Not on file    No Known Allergies       Family History  Problem Relation Age of Onset  . Hyperlipidemia Father   . Hypertension Father   . Alcohol abuse Father   . Cancer Sister        lung  . Hypertension Sister   . Heart disease Sister   . Hyperlipidemia Sister   . COPD Sister            Prior to Admission medications   Medication Sig Start Date End Date Taking? Authorizing Provider  amLODipine (NORVASC) 10 MG tablet Take 1 tablet (10 mg total) by mouth daily. 02/21/18  Yes Steele Sizer, MD  aspirin 81 MG tablet Take 81 mg by mouth daily.   Yes [provider]  atorvastatin (LIPITOR) 40 MG tablet Take 1 tablet (40 mg total) by  mouth daily. 02/21/18  Yes Sowles, Drue Stager, MD  Coenzyme Q10 (CO Q10) 200 MG CAPS Take 1 capsule by mouth daily.   Yes [provider]  docusate sodium (COLACE) 100 MG capsule Take 100 mg by mouth 2 (two) times daily.   Yes [provider]  enoxaparin (LOVENOX) 40 MG/0.4ML injection Inject 40 mg into the skin daily.   Yes [provider]  glipiZIDE (GLUCOTROL) 5 MG tablet Take 1 tablet (5 mg total) by mouth 2 (two) times daily before a meal. 07/04/18  Yes Sowles, Drue Stager, MD  lisinopril-hydrochlorothiazide (ZESTORETIC) 20-12.5 MG tablet Take 1 tablet by mouth 2 (two) times daily. 02/21/18  Yes Sowles, Drue Stager, MD  metFORMIN (GLUCOPHAGE-XR) 750 MG 24 hr tablet Take 2 tablets (1,500 mg total) by mouth daily with breakfast. 02/21/18  Yes Sowles, Drue Stager, MD  metoprolol succinate (TOPROL-XL) 50 MG 24 hr tablet Take 1 tablet (50 mg total) by mouth daily. Take with or immediately following a meal. 07/04/18  Yes Sowles, Drue Stager, MD  Multiple Vitamins-Minerals (CENTRUM SILVER PO) Take 1 tablet by mouth daily.    Yes [provider]  OLANZapine zydis (ZYPREXA) 5 MG disintegrating tablet Take 5 mg by mouth every 12 (twelve) hours as needed (agitation).   Yes [provider]  Omega-3 Fatty Acids (FISH OIL) 1200 MG CAPS Take 1,200 mg by mouth daily.   Yes [provider]  omeprazole (PRILOSEC) 40 MG capsule Take 1 capsule (40 mg total) by mouth daily. 02/21/18  Yes Sowles, Drue Stager, MD  QUEtiapine (SEROQUEL) 25 MG tablet Take 12.5 mg by mouth daily.   Yes [provider]  QUEtiapine (SEROQUEL) 50 MG tablet Take 50 mg by mouth at bedtime.   Yes [provider]  sitaGLIPtin (JANUVIA) 100 MG tablet Take 1 tablet (100 mg total) by mouth daily. 07/04/18  Yes Sowles, Drue Stager, MD  Vitamin D, Cholecalciferol, 1000 units TABS Take 1,000 units of lipase  by mouth daily.    Yes [provider]  glucose blood test strip Use as  instructed 08/06/18   Steele Sizer, MD  Influenza vac split quadrivalent PF (FLUZONE HIGH-DOSE) 0.5 ML injection Fluzone High-Dose 2019-20 (PF) 180 mcg/0.5 mL intramuscular syringe  ADM 0.5ML IM UTD    [provider]    Physical Exam:       Vitals:   08/29/18 0618 08/29/18 1924 08/30/18 0505 08/30/18 0913  BP: (!) 155/72 (!) 155/97 (!) 165/86 129/75  Pulse: 80 91 97 (!) 165  Resp: 17 20 20    Temp: 97.9 F (36.6 C) 99.3 F (37.4 C) 98.7 F (37.1 C) 97.8 F (36.6 C)  TempSrc: Oral Oral Oral Oral  SpO2: 100% 100% 100% 100%  Weight:      Height:          General:  Appears agitated, disoriented but awake and alert, not able to answer any questions  Eyes:  normal lids, iris, not able to track  ENT:  grossly normal hearing, lips & tongue, mmm; petechiae on tongue, does not follow commands so limited evaluation  Neck:  no LAD, masses or thyromegaly  Cardiovascular:  RRR, no m/r/g. No LE edema.   Respiratory:   CTA bilaterally with no wheezes/rales/rhonchi.  Normal respiratory effort.  Abdomen:  soft, NT, ND, NABS  Skin:  no rash or induration seen on limited exam  Musculoskeletal:  grossly normal tone BUE/BLE, good ROM, no bony abnormality  Psychiatric:  Alert, not oriented even to person, intermittent nonsensical speech  Neurologic: unable to perform    Radiological Exams on Admission:  ImagingResults(Last48hours)  Dg Chest Port 1 View  Result Date: 08/28/2018 CLINICAL DATA:  Cough. EXAM: PORTABLE CHEST 1 VIEW COMPARISON:  07/21/2018 FINDINGS: The heart is within normal limits in size and stable. There is tortuosity and calcification of the thoracic aorta. Chronic bronchitic lung changes but no definite infiltrates, effusions or edema. The bony structures are intact. Stable degenerative changes involving both shoulders. IMPRESSION: No acute cardiopulmonary findings. Electronically Signed   By: Marijo Sanes M.D.   On: 08/28/2018  19:42     EKG: Independently reviewed.   0924 - SVT with rate 163; RBBB 1012 - SVT, rate 134; RBBB; nonspecific ST changes which may be rate-related  Labs on Admission: I have personally reviewed the available labs and imaging studies at the time of the admission.  Pertinent labs:   CO2 21 Glucose 334 MB 1.4 Troponin <0.03 WBC 10.5 Hgb 12.7   Assessment/Plan Active Problems:   Mass of pineal region   Debility   Meningioma (HCC)   Nontraumatic intraventricular intracerebral hemorrhage (HCC)   Acute blood loss anemia   Hypoalbuminemia due to protein-calorie malnutrition (HCC)   Hypokalemia   Leukocytosis   Encephalopathy   SVT (supraventricular tachycardia) (HCC)   SVT -Patient with prior SVT at OSH -Developed recurrent tachycardia this AM -It did not respond to vagal maneuvers or metoprolol and so he was given adenosine with improvement in rate -Subsequent evaluation appeared to show sinus tachycardia and so this was thought to be c/w SVT (rather than aflutter) -Cardiology was at the bedside throughout event -Toprol XL increased to 50 mg BID -Echo requested -He is not a candidate for [facility] given his intracranial bleeding -Cardiology requested transfer -TSH pending  PRES -Likely PRES associated with edema and surgical intervention and likely exacerbated by acute-onset blindness -This has variable response but tends to improve within a few weeks -He is approaching 3 weeks  out and is continuing to have waxing and waning -Neurology consult requested; Dr. Lorraine Lax to see -Will repeat head CT with contrast; patient seems too agitated to successfully complete MRI -EEG requested -He will need a 1:1 sitter overnight, as he is clearly too agitated to be safe without this  -Continue Seroquel -He may be a better candidate for LTAC than CIR once he is medically stable if he continues to have ongoing cognitive issues  Meningioma with IVH -Recent resection at [facility] which was  thought to be almost complete -Subsequent hemorrhage in recovery period -Avoid AC  DM -Recent A1c 8.7 -Home meds have been held, including Januvia, Glucotrol, and Glucophage -He may require basal insulin -For now will cover with moderate-scale SSI to determine 24 hour insulin requirement  HTN -Continue Norvasc, Zestoretic -Increase Toprol XL to BID, as per cardiology  OSA -No CPAP for now given recent intracranial surgery  COPD -Appears to be stable at this time     DVT prophylaxis: SCDs - NO AC Code Status:  Full - confirmed with family Family Communication: Wife and daughter were present throughout evaluation  Disposition Plan:  To be determined Consults called: Cardiology, neurology Admission status: Admit - It is my clinical opinion that admission to INPATIENT is reasonable and necessary because of the expectation that this patient will require hospital care that crosses at least 2 midnights to treat this condition based on the medical complexity of the problems presented.  Given the aforementioned information, the predictability of an adverse outcome is felt to be significant.    Karmen Bongo MD [facility]   How to contact the [facility] Attending or Consulting provider [place] or covering provider during after hours [place], for this patient?  1. Check the care team in [facility] and look for a) attending/consulting [facility] provider listed and b) the [facility] team listed 2. Log into [email or web address] and use [place]'s universal password to access. If you do not have the password, please contact the hospital operator. 3. Locate the [facility] provider you are looking for under [facility] and page to a number that you can be directly reached. 4. If you still have difficulty reaching the provider, please page the [facility] (Director on Call) for the Hospitalists listed on amion for assistance.   08/30/2018, 10:38 AM

## 2018-08-30 NOTE — Progress Notes (Signed)
Metoprolol 5 mg and adenosine 6 mg given by rapid response RN. Per MD not giving any additional doses at this time.

## 2018-08-30 NOTE — Progress Notes (Signed)
Speech Language Pathology Daily Session Note  Patient Details  Name: Jason Farrell. MRN: [id number] Date of Birth: Oct 21, 1940  Today's Date: 08/30/2018 SLP Individual Time: 0800-0856 SLP Individual Time Calculation (min): 56 min  Short Term Goals: Week 1: SLP Short Term Goal 1 (Week 1): Patient will demonstrate sustaind attention to tasks for ~5 minutes with Mod A verbal cus for redirection.  SLP Short Term Goal 2 (Week 1): Patient will orient to place, month and situation with Max A multimodal cues.  SLP Short Term Goal 3 (Week 1): Patient will verbalize 2 physical/cognitive changes since hospitalization with Max A multomodal cues.  SLP Short Term Goal 4 (Week 1): Patient will demonstrate functional problem solving during very basic ADL tasks with Max A multimodal cues.  SLP Short Term Goal 5 (Week 1): Patient will consume current diet with minimal overt s/s of aspiration with supervision verbal cues.  SLP Short Term Goal 6 (Week 1): Patient will demonstrate efficient mastication and complete oral clearance with trials of regular textures over 2 sessions with supervision verbal cues prior to upgrade.   Skilled Therapeutic Interventions:Skilled ST services focused on swallow and cognitive skills. Pt was agreeable to participate in treatment. SLP facilitated PO consumption of dys 3 and thin via straw breakfast tray, pt demonstrated slightly abnormal mastication, however appropriate oral clearance and no overt s/s aspiration. Pt required assisted picking up cup and putting food on fork, however demonstrated basic problem solving of self-feeding with min A verbal cues. SLP facilitated orientation to place and time, pt required max A verbal and binary choice cues for 80% accuracy. Pt was left in room with call bell within reach, enclosure bed sealed and nursing present. SLP reccomends to continue skilled services.     Pain Pain Assessment Pain Score: 3  Pain Location: Head Pain Descriptors  / Indicators: Headache Pain Intervention(s): RN made aware  Therapy/Group: Individual Therapy  Tharon Bomar  [facility] 08/30/2018, 9:03 AM

## 2018-08-30 NOTE — Progress Notes (Signed)
*  PRELIMINARY RESULTS* Echocardiogram 2D Echocardiogram has been performed.  Jason Farrell 08/30/2018, 4:42 PM

## 2018-08-31 ENCOUNTER — Inpatient Hospital Stay (HOSPITAL_COMMUNITY): Payer: Managed Care, Other (non HMO) | Admitting: Physical Therapy

## 2018-08-31 ENCOUNTER — Inpatient Hospital Stay (HOSPITAL_COMMUNITY): Payer: Medicare Other

## 2018-08-31 DIAGNOSIS — I471 Supraventricular tachycardia: Principal | ICD-10-CM

## 2018-08-31 LAB — GLUCOSE, CAPILLARY
GLUCOSE-CAPILLARY: 182 mg/dL — AB (ref 70–99)
Glucose-Capillary: 174 mg/dL — ABNORMAL HIGH (ref 70–99)
Glucose-Capillary: 198 mg/dL — ABNORMAL HIGH (ref 70–99)
Glucose-Capillary: 189 mg/dL — ABNORMAL HIGH (ref 70–99)

## 2018-08-31 LAB — BASIC METABOLIC PANEL
Anion gap: 8 (ref 5–15)
BUN: 12 mg/dL (ref 8–23)
CHLORIDE: 107 mmol/L (ref 98–111)
CO2: 25 mmol/L (ref 22–32)
Calcium: 8.4 mg/dL — ABNORMAL LOW (ref 8.9–10.3)
Creatinine, Ser: 0.77 mg/dL (ref 0.61–1.24)
GFR calc Af Amer: >60 mL/min (ref >60)
GFR calc non Af Amer: >60 mL/min (ref >60)
Glucose, Bld: 165 mg/dL — ABNORMAL HIGH (ref 70–99)
POTASSIUM: 3.5 mmol/L (ref 3.5–5.1)
Sodium: 140 mmol/L (ref 135–145)

## 2018-08-31 LAB — CBC
HCT: 36.3 % — ABNORMAL LOW (ref 39.0–52.0)
HEMOGLOBIN: 11.3 g/dL — AB (ref 13.0–17.0)
MCH: 27.8 pg (ref 26.0–34.0)
MCHC: 31.1 g/dL (ref 30.0–36.0)
MCV: 89.4 fL (ref 80.0–100.0)
Platelets: 291 10*3/uL (ref 150–400)
RBC: 4.06 MIL/uL — AB (ref 4.22–5.81)
RDW: 14.6 % (ref 11.5–15.5)
WBC: 9.3 10*3/uL (ref 4.0–10.5)
nRBC: 0 % (ref 0.0–0.2)

## 2018-08-31 MED ORDER — AMIODARONE LOAD VIA INFUSION
150.0000 mg | Freq: Once | INTRAVENOUS | Status: AC
  Administered 2018-08-31: 150 mg via INTRAVENOUS
  Filled 2018-08-31: qty 83.34

## 2018-08-31 MED ORDER — AMIODARONE HCL IN DEXTROSE 360-4.14 MG/200ML-% IV SOLN
30.0000 mg/h | INTRAVENOUS | Status: DC
  Administered 2018-08-31 – 2018-09-02 (×5): 30 mg/h via INTRAVENOUS
  Filled 2018-08-31 (×4): qty 200

## 2018-08-31 MED ORDER — HALOPERIDOL LACTATE 5 MG/ML IJ SOLN
2.0000 mg | Freq: Once | INTRAMUSCULAR | Status: AC
  Administered 2018-08-31: 2 mg via INTRAVENOUS
  Filled 2018-08-31: qty 1

## 2018-08-31 MED ORDER — AMIODARONE HCL IN DEXTROSE 360-4.14 MG/200ML-% IV SOLN
INTRAVENOUS | Status: AC
  Filled 2018-08-31: qty 200

## 2018-08-31 MED ORDER — METOPROLOL TARTRATE 5 MG/5ML IV SOLN
5.0000 mg | Freq: Four times a day (QID) | INTRAVENOUS | Status: DC
  Administered 2018-08-31 – 2018-09-02 (×8): 5 mg via INTRAVENOUS
  Filled 2018-08-31 (×9): qty 5

## 2018-08-31 MED ORDER — ADENOSINE 6 MG/2ML IV SOLN
12.0000 mg | Freq: Once | INTRAVENOUS | Status: DC
  Filled 2018-08-31: qty 4

## 2018-08-31 MED ORDER — AMIODARONE HCL IN DEXTROSE 360-4.14 MG/200ML-% IV SOLN
60.0000 mg/h | INTRAVENOUS | Status: AC
  Administered 2018-08-31 (×2): 60 mg/h via INTRAVENOUS
  Filled 2018-08-31: qty 200

## 2018-08-31 MED ORDER — METOPROLOL TARTRATE 5 MG/5ML IV SOLN
5.0000 mg | INTRAVENOUS | Status: AC
  Administered 2018-08-31: 5 mg via INTRAVENOUS

## 2018-08-31 MED ORDER — METOPROLOL TARTRATE 5 MG/5ML IV SOLN
INTRAVENOUS | Status: AC
  Filled 2018-08-31: qty 5

## 2018-08-31 MED ORDER — ADENOSINE 6 MG/2ML IV SOLN
6.0000 mg | Freq: Once | INTRAVENOUS | Status: AC
  Administered 2018-08-31: 6 mg via INTRAVENOUS
  Filled 2018-08-31 (×3): qty 2

## 2018-08-31 NOTE — Progress Notes (Signed)
Interim note:  EEG pending Also will get MRI brain with and without contrast to evaluate for bilateral vision loss

## 2018-08-31 NOTE — Progress Notes (Addendum)
Through out today, staff has rounded on pt who has been accompanied by family until 10pm. At which time staff including this RN have traded off sitting with pt for safety monitoring.   Pt is becoming more confused/restless in bed at this hour. Pt is removing mittens, pt is tugging at catheter tube, pt is kicking legs into the air; will administer PO xyprexa per PRN order prior to night shift.   Pt's VS are continuing to improve, prior to evening dose of IV lopressor. Will give xyprexa then re-assessing VS.

## 2018-08-31 NOTE — Progress Notes (Signed)
Pt resting at shift report, night NT remains at bedside in hopes of family/additional staff arriving to fill sitter need.

## 2018-08-31 NOTE — Progress Notes (Signed)
3:24 PM  Adenosine 6mg     3:25 PM Pt alert, cough; Hr 120, ST.  No second dose of adenosine, begin amiodarone drip with 150mg  bolus.   3:26 PM VS 113 - Monitor.  136/102

## 2018-08-31 NOTE — Significant Event (Signed)
Rapid Response Event Note  Overview: Called to assist with patient needing Adenosine Time Called: 4540 Arrival Time: 1450 Event Type: Cardiac  Initial Focused Assessment:  Patient alert - NAD - confused at neuro baseline - recent neurosurgery.  Warm and dry - flushed.  IV patent right antecubital - site unremarkable. Bil BS present with few rhonchi.  Denies CP or SOB - no acute distress noted - is agitated and startled with activity around him - calms with family support.  Richardson Dopp PA at bedside - 12 lead EKG done - reviewed by PA - SVT noted.  2 liters n/c O2 sats 100%.   BP 140/102 HR 155 RR 22.     Interventions: Placed on Zoll pads and monitor - suction set up to room - second PIV line started left forearm x 1 attempt - #20 angiocath - Ambu bag to room with crash cart.  Dr. Johnsie Cancel to bedside - 1524:   Adenosine 6mg  IV given IVP via left PIV - patient tol well - see Dr. Kyla Balzarine note - ST 110 BP 136/102 RR 18 O2 sats 100%.  Alert - NAD.  Amiodarone drip started with 150 mg IV bolus over 10 mins then 60mg /hr via left PIV.  Patient stable - handoff to [employer].  To call as needed.   Plan of Care (if not transferred):  Event Summary: Name of Physician Notified: Dr.  Johnsie Cancel at (pta RRT)    at    Outcome: Stayed in room and stabalized  Event End Time: 1350  Quin Hoop

## 2018-08-31 NOTE — Progress Notes (Signed)
Order received for metoprolol 5mg  IV per Dr Marthenia Rolling. Given to pt with HR at 147 dinamap, 153 monitor.   Pt still sustaining at 150; reached back out to Dr Marthenia Rolling who advised contact be made with cardiology. Per pt discharge-readmit yesterday, there is technically not a cardiology consult/note from "this admission". Dr Marthenia Rolling states that he spoke with Dr Oval Linsey this morning about this pt, so regardless of note or consult from "this admission" this RN should be contacting them.   Will reach out.

## 2018-08-31 NOTE — Progress Notes (Signed)
Call placed to cards PA to request review of metoprolol order given pt's on amiodarone drip now.

## 2018-08-31 NOTE — Progress Notes (Signed)
No change in rate/rhythm s/p second lopressor 5mg  IV.   VS  137/104 BP 149 HR  S.Weaver PA at bedside, would like to give adenosine again since pt has not converted with two doses of lopressor.    RR RN called to bedside, CN called to bedside. Pt/Room prepared for adenosine, awaiting medication from pharmacy.  08/31/18 2:58 PM   Dr Johnsie Cancel requests to start Amiodarone for pt as well.

## 2018-08-31 NOTE — Progress Notes (Signed)
PROGRESS NOTE    Jason Farrell.  IPJ:[id number] DOB: [birth year] DOA: 08/30/2018 PCP: Steele Sizer, MD  Outpatient Specialists:     Brief Narrative:  Jason Farrellis a 78 y.o.malewith medical history significant ofCAD; HTN; HLD; OSA on CPAP; and DM admitted to CIR on 08/28/2018 with functional deficits s/p craniotomy with tumor resection at [facility] on 08/11/2018. Patient developed increased confusion, lethargy and SVT while out of the inpatient rehabilitation unit.  Patient was transferred to the cardiac telemetry unit for further assessment and management of the SVT, and other medical problems.    Assessment & Plan:   Principal Problem:   SVT (supraventricular tachycardia) (HCC) Active Problems:   Essential hypertension   COLD (chronic obstructive lung disease) (HCC)   Obstructive sleep apnea on CPAP   Controlled type 2 diabetes mellitus without complication, without long-term current use of insulin (HCC)   Meningioma (HCC)   Nontraumatic intraventricular intracerebral hemorrhage (HCC)   PRES (posterior reversible encephalopathy syndrome)  SVT -Patient with prior SVT at OSH -Developed recurrent tachycardia -It did not respond to vagal maneuvers or metoprolol and so he was given adenosine with improvement in rate -Subsequent evaluation appeared to show sinus tachycardia and so this was thought to be c/w SVT (rather than aflutter) -Cardiology was at the bedside throughout event -Toprol XL increased to 50 mg BID -Echo requested -He is not a candidate for [facility] given his intracranial bleeding -TSH is 0.763.  PRES -Likely PRES associated with edema and surgical intervention and likely exacerbated by acute-onset blindness -This has variable response but tends to improve within a few weeks -He is approaching 3 weeks out. -Neurology consult requested; Dr. Lorraine Lax to see -Repeat CT head result is noted. -EEG requested -Patient continues to need one-to-one sitter.     -Continue Seroquel -Disposition will depend on hospital course.  CT head revealed:  1. Postoperative changes from interval posterior craniotomy for meningioma resection. Probable small amount of residual blood products at the resection cavity. Parenchymal hypodensity within the adjacent splenium could reflect residual edema and/or peri resection ischemic changes. No obvious gross residual tumor, although possible small amount of residual tumor along the adjacent vein of Galen not excluded by CT. 2. Sequelae of prior right frontal EVD with trace residual pneumocephalus and small amount of residual intraventricular hemorrhage. Overall ventricular size perhaps slightly decreased from preoperative exam. 3. No other new acute intracranial abnormality. Underlying chronic microvascular ischemic changes with remote left basal ganglia lacunar infarct unchanged.  ADDENDUM: In addition to the initially described findings, note made of a 2.2 cm mass within the superficial aspect of the left parotid gland, incompletely visualized. Nonemergent outpatient ENT referral for further workup and evaluation suggested for further evaluation.  Meningioma with IVH -Recent resection at [facility] which was thought to be almost complete -Subsequent hemorrhage in recovery period -Avoid AC  DM -Recent A1c 8.7 -Home meds have been held, including Januvia, Glucotrol, and Glucophage -He may require basal insulin -For now will cover with moderate-scale SSI to determine 24 hour insulin requirement 08/31/2018: Blood sugar control continues to improve.   HTN -Continue Norvasc, Zestoretic -Increase Toprol XL to BID, as per cardiology 08/31/2018: Blood pressure control is optimized.  OSA -No CPAP for now given recent intracranial surgery  COPD -Appears to be stable at this time  DVT prophylaxis:SCDs - NO AC Code Status:Full - confirmed with family Family Communication:  Disposition Plan: To be  determined Consults called:Cardiology, neurology     Procedures:   None  Antimicrobials:  None  Subjective: Patient could not give any coherent history.  Objective: Vitals:   08/30/18 1809 08/30/18 1810 08/30/18 2002 08/31/18 0439  BP:  (!) 133/100 138/69 (!) 144/75  Pulse:  (!) 137 84 96  Resp:  18 18 18   Temp:  (!) 97.4 F (36.3 C) (!) 97.5 F (36.4 C) 98.2 F (36.8 C)  TempSrc:  Oral Oral Oral  SpO2:   98% 100%  Weight: 87.8 kg   86.6 kg  Height:  5\' 6"  (1.676 m)      Intake/Output Summary (Last 24 hours) at 08/31/2018 0829 Last data filed at 08/31/2018 0705 Gross per 24 hour  Intake 855.94 ml  Output 550 ml  Net 305.94 ml   Filed Weights   08/30/18 1738 08/30/18 1809 08/31/18 0439  Weight: 87.6 kg 87.8 kg 86.6 kg    Examination:  General exam: Appears calm and comfortable.  Seems confused. Respiratory system: Clear to auscultation.  Cardiovascular system: S1 & S2 heard, RRR. No JVD, murmurs, rubs, gallops or clicks. No pedal edema. Gastrointestinal system: Abdomen is obese, soft and nontender.  No palpable.   Central nervous system: Patient is awake.  Patient moves all limbs.  Patient is confused.  Extremities: Symmetric 5 x 5 power.  Data Reviewed: I have personally reviewed following labs and imaging studies  CBC: Recent Labs  Lab 08/29/18 0659 08/30/18 0935 08/31/18 0546  WBC 11.0* 10.5 9.3  NEUTROABS 9.0*  --   --   HGB 12.0* 12.7* 11.3*  HCT 36.1* 39.6 36.3*  MCV 88.9 89.8 89.4  PLT 298 343 696   Basic Metabolic Panel: Recent Labs  Lab 08/29/18 0659 08/30/18 0935 08/31/18 0546  NA 140 137 140  K 4.0 4.6 3.5  CL 106 106 107  CO2 23 21* 25  GLUCOSE 184* 334* 165*  BUN 8 16 12   CREATININE 0.69 0.95 0.77  CALCIUM 8.6* 9.1 8.4*   GFR: Estimated Creatinine Clearance: 82.3 mL/min (by C-G formula based on SCr of 0.77 mg/dL). Liver Function Tests: Recent Labs  Lab 08/29/18 0659  AST 21  ALT 34  ALKPHOS 68  BILITOT 0.7  PROT  5.4*  ALBUMIN 2.4*   No results for input(s): LIPASE, AMYLASE in the last 168 hours. No results for input(s): AMMONIA in the last 168 hours. Coagulation Profile: No results for input(s): INR, PROTIME in the last 168 hours. Cardiac Enzymes: Recent Labs  Lab 08/30/18 0935  CKTOTAL 29*  CKMB 1.4  TROPONINI <0.03   BNP (last 3 results) No results for input(s): PROBNP in the last 8760 hours. HbA1C: No results for input(s): HGBA1C in the last 72 hours. CBG: Recent Labs  Lab 08/30/18 1015 08/30/18 1148 08/30/18 1737 08/30/18 2126 08/31/18 0757  GLUCAP 328* 271* 110* 153* 174*   Lipid Profile: No results for input(s): CHOL, HDL, LDLCALC, TRIG, CHOLHDL, LDLDIRECT in the last 72 hours. Thyroid Function Tests: Recent Labs    08/30/18 1554  TSH 0.763   Anemia Panel: No results for input(s): VITAMINB12, FOLATE, FERRITIN, TIBC, IRON, RETICCTPCT in the last 72 hours. Urine analysis:    Component Value Date/Time   COLORURINE YELLOW (A) 07/21/2018 1526   APPEARANCEUR CLEAR (A) 07/21/2018 1526   LABSPEC 1.019 07/21/2018 1526   PHURINE 5.0 07/21/2018 1526   GLUCOSEU NEGATIVE 07/21/2018 1526   HGBUR NEGATIVE 07/21/2018 1526   BILIRUBINUR NEGATIVE 07/21/2018 1526   BILIRUBINUR negative 07/21/2018 1314   KETONESUR NEGATIVE 07/21/2018 1526   PROTEINUR NEGATIVE 07/21/2018 1526   UROBILINOGEN 0.2  07/21/2018 1314   NITRITE NEGATIVE 07/21/2018 [place] 07/21/2018 1526   Sepsis Labs: @LABRCNTIP (procalcitonin:4,lacticidven:4)  )No results found for this or any previous visit (from the past 240 hour(s)).       Radiology Studies: Ct Head W & Wo Contrast  Addendum Date: 08/30/2018   ADDENDUM REPORT: 08/30/2018 22:35 ADDENDUM: In addition to the initially described findings, note made of a 2.2 cm mass within the superficial aspect of the left parotid gland, incompletely visualized. Nonemergent outpatient ENT referral for further workup and evaluation suggested  for further evaluation. Electronically Signed   By: Jeannine Boga M.D.   On: 08/30/2018 22:35   Result Date: 08/30/2018 CLINICAL DATA:  Follow-up postsurgical evaluation status post meningioma resection. EXAM: CT HEAD WITHOUT AND WITH CONTRAST TECHNIQUE: Contiguous axial images were obtained from the base of the skull through the vertex without and with intravenous contrast CONTRAST:  34mL OMNIPAQUE IOHEXOL 300 MG/ML  SOLN COMPARISON:  Prior preoperative CT and MRI from 07/21/2018 FINDINGS: Brain: Examination degraded by motion artifact. Postoperative changes from prior parieto-occipital craniotomy for meningioma resection seen. Dural thickening and enhancement subjacent to the craniotomy bone flap most consistent with postoperative changes. Trace residual extra-axial/subdural collection measuring up to 2 mm seen just superficial to the dura. No significant mass effect. Previously seen meningioma positioned at the cerebral aqueduct has been resected. Probable small amount of residual blood products within the resection cavity. Hypodensity within the adjacent right greater than left splenium could reflect residual edema and/or Peri section ischemic change (series 3, image 20). The adjacent vein of Galen and internal cerebral veins deviated to the left, similar to previous. Suspected small amount of residual tumor adjacent to the vein of Galen, although difficult to discern by CT as any potential residual tumor and adjacent vein enhanced to similar attenuation. Small amount of degenerating blood products seen layering within the occipital horn of the right lateral ventricle. Trace residual pneumocephalus at the right frontal horn. Overall ventricular size perhaps slightly decreased from preoperative exam. No significant midline shift. Basilar cisterns remain patent. Underlying age-related cerebral atrophy with chronic microvascular ischemic disease again noted. Remote left basal ganglia lacunar infarct  unchanged. Mineralization/calcification at the lateral right thalamus unchanged. No other acute large vessel territory infarct. Vascular: Calcified atherosclerosis at the skull base. Normal intravascular enhancement seen within the intracranial circulation following contrast administration. Skull: Post craniotomy changes noted posteriorly. Right frontal burr hole from previous CBD noted. Scalp soft tissues demonstrate no acute finding. Sinuses/Orbits: Globes and orbital soft tissues within normal limits. Paranasal sinuses are largely clear. No mastoid effusion. Other: None. IMPRESSION: 1. Postoperative changes from interval posterior craniotomy for meningioma resection. Probable small amount of residual blood products at the resection cavity. Parenchymal hypodensity within the adjacent splenium could reflect residual edema and/or peri resection ischemic changes. No obvious gross residual tumor, although possible small amount of residual tumor along the adjacent vein of Galen not excluded by CT. 2. Sequelae of prior right frontal EVD with trace residual pneumocephalus and small amount of residual intraventricular hemorrhage. Overall ventricular size perhaps slightly decreased from preoperative exam. 3. No other new acute intracranial abnormality. Underlying chronic microvascular ischemic changes with remote left basal ganglia lacunar infarct unchanged. Electronically Signed: By: Jeannine Boga M.D. On: 08/30/2018 22:18        Scheduled Meds: . amLODipine  10 mg Oral Daily  . aspirin  81 mg Oral Daily  . atorvastatin  40 mg Oral Daily  . docusate sodium  100  mg Oral BID  . lisinopril  20 mg Oral Daily   And  . hydrochlorothiazide  12.5 mg Oral Daily  . insulin aspart  0-15 Units Subcutaneous TID WC  . insulin aspart  0-5 Units Subcutaneous QHS  . metoprolol tartrate  50 mg Oral BID  . pantoprazole  40 mg Oral Daily  . QUEtiapine  12.5 mg Oral Daily  . QUEtiapine  50 mg Oral QHS  . sodium  chloride flush  3 mL Intravenous Q12H   Continuous Infusions: . sodium chloride 75 mL/hr at 08/31/18 0811     LOS: 1 day    Time spent: 35 minutes    Dana Allan, MD  [facility] Pager #: [phone] 7PM-7AM contact night coverage as above

## 2018-08-31 NOTE — Progress Notes (Signed)
Page to Kathlen Mody   3e05 pt tachy in 145-150 given 1 dose of Lopressor 5mg - no change.

## 2018-08-31 NOTE — Progress Notes (Signed)
Asked to evaluate patient with rapid atrial arrhythmia and low BP Similar issues yesterday Rx by Dr Oval Linsey HR initially 160-170 narrow complex no flutter waves seen Discussed care with wife and daughter at bedside  No slowing with iv lopressor or vagal maneuvers Hooked up to pads/monitor   Adenosine 6 mg given with long pause and sinus escape  Accelerating to sinus tachycardia at 105 bpm  BP 140/80 Stable   Will start iv amiodarone since this has been a recurrence interfering With care  Does not appear to be flutter/fib and he is not a candidate For anticoagulation anyway due to recent CNS surgery for meningioma With IVH and SDH  Total critical care time spent reviewed chart discussing with rapid respnse PA and family as well as chemical monitored cardioversion with adenosine 45 minutes   Jenkins Rouge

## 2018-08-31 NOTE — Progress Notes (Signed)
   Vital Signs MEWS/VS Documentation      08/31/2018 1500 08/31/2018 1524 08/31/2018 1526 08/31/2018 1527   MEWS Score:  3  2  2  1    MEWS Score Color:  Yellow  Yellow  Yellow  Green   Pulse:  (!) 153  -  -  -   BP:  -  -  -  (!) 134/102           Maud Deed Tobias-Diakun 08/31/2018,4:35 PM

## 2018-08-31 NOTE — Evaluation (Signed)
Clinical/Bedside Swallow Evaluation Patient Details  Name: Jason Farrell. MRN: [id number] Date of Birth: [birth year]  Today's Date: 08/31/2018 Time: SLP Start Time (ACUTE ONLY): 0930 SLP Stop Time (ACUTE ONLY): 1000 SLP Time Calculation (min) (ACUTE ONLY): 30 min  Past Medical History:  Past Medical History:  Diagnosis Date  . Coronary artery disease   . Diabetes mellitus without complication ([place])   . Dyslipidemia   . GERD (gastroesophageal reflux disease)   . Hyperlipidemia   . Hypertension   . MI (myocardial infarction) ([place])   . Osteoarthritis    Past Surgical History:  Past Surgical History:  Procedure Laterality Date  . CARDIAC CATHETERIZATION  2006   stent placement  . COLONOSCOPY    . CORONARY ANGIOPLASTY WITH STENT PLACEMENT  2006   stent x 2   . CORONARY ANGIOPLASTY WITH STENT PLACEMENT  1996   stent x 3   . FOOT SURGERY Right   . HALLUX VALGUS CHEILECTOMY Right 02/25/2015   Procedure: HALLUX VALGUS CHEILECTOMY;  Surgeon: Jason Farrell, DPM;  Location: [facility] ORS;  Service: Podiatry;  Laterality: Right;   HPI:  Patient is a 78 year old male with history ofCAD, T2DM, HTN, OSA--compliant with CPAP who was evaluated in the ED01/06/20with3-4 week history of intermittent bouts of confusionwith lability,headaches, difficulty speaking and visual changes. 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 He was referred to [facility] for evaluation and underwent Craniotomy withresection of pineal region massand placement of EVDon 08/11/2017 by Dr. Francesca Farrell. Post procedure treated with decadron and IV Keppra X 7 days. Drain removed with 1/30 and on 1/31 he developed decrease in LOC requiring transfer to ICU. 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 MRI brain showed small amount of hemorrhage at resection site with focal  hemorrhage/clot within right ventricle and unchanged appearance of increased T2/Flair signal. CTA head/neck done reveling narrow/diminshed flow in right V4 segment question due to dissection, thrombus or vasospasm. Patient also with resultant confusion with agitation and loss of bilateral vision. Neurology felt that patient'sMS changes were felt to bedue to delirium and did not feel seizures were cause -- d/c Keppra after 7 days. Repeat CT head showed no change in small left parietal SDH with moderate R>L ventriculomegaly and no significant interval change. Ophthalmology (Dr. Judithann Farrell) consulted and felt that visual loss cortical in nature due to lack of ophthalmologic finding --likely due to PRES over PION.   Plans were to admit patient to CIR on 02/07 but he developed increase in confusion and lethargy.CBC showed rise in WBC to 30 and LP/BC done for work up. CSF/BC negative.Repeat CT head showedimprovement in intraventricular blood and intraventricular air. Leucocytosis felt to be due to steroids.Seroquel added at nights to help with sleep wake disruption and Zyprexa being used prn agitation during the day. He developed SVT with HR up to 150's on 2/11 and lopressor added with resolution. On dysphagia 3 diet with question of aspiration.   He continues to be confused, is oriented to self, year and situation.  He has visual deficits and responds to visual threat at times, continues to wax and wane with poor safety awarenss and impulsivity.  He was subsequently transferred from CIR due to SVT.  Patient with new finding on most recent head CT for a 2.2cm mass within the superficial aspect of the left parotid gland.  Most recent chest xray was showing no  acute cardiopulmonary findings.     Assessment / Plan / Recommendation Clinical Impression  Clinical swallowing evaluation was completed using thin liquids via spoon, cup and straw, pureed material, dual textured solids and dry solids.  Cranial  nerve exam was completed and unremarkable.  Lingual, labial, facial and jaw range of motion and strength appeared to be adequate.  Volitional cough was mildly weak.  The patient stated he has trouble swallowing but was unable to elaborate on the details of the issues.  He presented with a possible oropharygneal dysphagia.  Decreased attention to boluses was seen particularly on dry and dual textured solids.  He was observed to just stop masticating material with an open mouth posture requiring cue to continue to chew.  Swallow trigger was appreciated to palpation. Intermittent coughing and throat clearing was seen and it was difficult to ascertain due to which texture.  He was also unable to complete a 3 oz water challenge.  Given this recommend he remain NPO pending results of MBS to determine current swallowing physiology and least restrictive diet.     SLP Visit Diagnosis: Dysphagia, unspecified (R13.10)    Aspiration Risk  Moderate aspiration risk    Diet Recommendation   NPO  Medication Administration: Crushed with puree    Other  Recommendations Oral Care Recommendations: Oral care QID   Follow up Recommendations Other (comment)(TBD)             Prognosis Prognosis for Safe Diet Advancement: Good Barriers to Reach Goals: Cognitive deficits      Swallow Study   General Date of Onset: 07/21/18 HPI: Patient is a 78 year old male with history ofCAD, T2DM, HTN, OSA--compliant with CPAP who was evaluated in the ED01/06/20with3-4 week history of intermittent bouts of confusionwith lability,headaches, difficulty speaking and visual changes. 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 He was referred to [facility] for evaluation and underwent Craniotomy withresection of pineal region massand placement of EVDon 08/11/2017 by Dr. Francesca Farrell. Post procedure treated with  decadron and IV Keppra X 7 days. Drain removed with 1/30 and on 1/31 he developed decrease in LOC requiring transfer to ICU. 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 MRI brain showed small amount of hemorrhage at resection site with focal hemorrhage/clot within right ventricle and unchanged appearance of increased T2/Flair signal. CTA head/neck done reveling narrow/diminshed flow in right V4 segment question due to dissection, thrombus or vasospasm. Patient also with resultant confusion with agitation and loss of bilateral vision. Neurology felt that patient'sMS changes were felt to bedue to delirium and did not feel seizures were cause -- d/c Keppra after 7 days. Repeat CT head showed no change in small left parietal SDH with moderate R>L ventriculomegaly and no significant interval change. Ophthalmology (Dr. Judithann Farrell) consulted and felt that visual loss cortical in nature due to lack of ophthalmologic finding --likely due to PRES over PION.   Plans were to admit patient to CIR on 02/07 but he developed increase in confusion and lethargy.CBC showed rise in WBC to 30 and LP/BC done for work up. CSF/BC negative.Repeat CT head showedimprovement in intraventricular blood and intraventricular air. Leucocytosis felt to be due to steroids.Seroquel added at nights to help with sleep wake disruption and Zyprexa being used prn agitation during the day. He developed SVT with HR up to 150's on 2/11 and lopressor added with resolution. On dysphagia 3  diet with question of aspiration.   He continues to be confused, is oriented to self, year and situation.  He has visual deficits and responds to visual threat at times, continues to wax and wane with poor safety awarenss and impulsivity.  He was subsequently transferred from CIR due to SVT.  Patient with CT head showing Postoperative changes from interval posterior craniotomy for Type of Study: Bedside Swallow Evaluation Previous  Swallow Assessment: BSE at [facility], recommended Dys. 3 textures with thin liquids  Diet Prior to this Study: Dysphagia 1 (puree) Temperature Spikes Noted: No Respiratory Status: Room air History of Recent Intubation: No Behavior/Cognition: Alert;Cooperative;Confused;Requires cueing Oral Cavity Assessment: Within Functional Limits Oral Care Completed by SLP: No Oral Cavity - Dentition: Adequate natural dentition Vision: Impaired for self-feeding Self-Feeding Abilities: Needs assist Patient Positioning: Upright in bed Baseline Vocal Quality: Normal Volitional Cough: Weak Volitional Swallow: Able to elicit    Oral/Motor/Sensory Function Overall Oral Motor/Sensory Function: Within functional limits   Ice Chips Ice chips: Not tested   Thin Liquid Thin Liquid: Impaired Presentation: Cup;Spoon;Straw;Self Fed Pharyngeal  Phase Impairments: Cough - Delayed;Throat Clearing - Delayed    Nectar Thick Nectar Thick Liquid: Not tested   Honey Thick Honey Thick Liquid: Not tested   Puree Puree: Impaired Presentation: Spoon Pharyngeal Phase Impairments: Cough - Delayed   Solid     Solid: Impaired Presentation: Spoon;Self Fed Oral Phase Impairments: Impaired mastication Oral Phase Functional Implications: Oral residue;Oral holding;Prolonged oral transit     Shelly Flatten, MA, CCC-SLP Acute Rehab SLP [phone]  Lamar Sprinkles 08/31/2018,10:16 AM

## 2018-08-31 NOTE — Progress Notes (Addendum)
Called to see patient  Patient Name: Jason Farrell. Date of Encounter: 08/31/2018  Primary Cardiologist:  New   Subjective   Jason Farrell has a hx of CAD, hyperlipidemia, SVT, diabetes, hypertension, COPD, obesity.  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  He then had PCI of 2 vessels in 2006 due to in-stent restenosis.  His last echo in 2006 revealed normal systolic function.  He last saw Dr. Rockey Situ 10/2017.  At that time he was doing well.  Patient is s/p meningioma resection with post op IVH and SDH and was seen yesterday in IP rehab by Dr. Oval Linsey. He had developed SVT with HR 140-150s.  He was given Metoprolol 5 mg IV x 1 and then Adenosine 6 mg IV x 1 converting to normal sinus rhythm HR 100.    He transferred to 3e today and has been in sinus until around 1:00.  He developed SVT with HR 140-150s.  He is confused at baseline and denies shortness of breath, palpitations.  He did tell the nurse he was having chest pain.  He has received 2 doses of Metoprolol 5 mg IV without change.  ECG:  SVT, HR 147, RBBB  Inpatient Medications    Scheduled Meds: . metoprolol tartrate      . amLODipine  10 mg Oral Daily  . aspirin  81 mg Oral Daily  . atorvastatin  40 mg Oral Daily  . docusate sodium  100 mg Oral BID  . lisinopril  20 mg Oral Daily   And  . hydrochlorothiazide  12.5 mg Oral Daily  . insulin aspart  0-15 Units Subcutaneous TID WC  . insulin aspart  0-5 Units Subcutaneous QHS  . metoprolol tartrate  5 mg Intravenous Q6H  . metoprolol tartrate  50 mg Oral BID  . pantoprazole  40 mg Oral Daily  . QUEtiapine  12.5 mg Oral Daily  . QUEtiapine  50 mg Oral QHS  . sodium chloride flush  3 mL Intravenous Q12H   Continuous Infusions: . sodium chloride 75 mL/hr at 08/31/18 0811   PRN Meds: acetaminophen **OR** acetaminophen, OLANZapine zydis, ondansetron **OR** ondansetron (ZOFRAN) IV   Vital Signs    Vitals:   08/31/18 0439 08/31/18 1223 08/31/18 1300 08/31/18  1340  BP: (!) 144/75 (!) 150/84 (!) 158/100   Pulse: 96 92 (!) 143 (!) 145  Resp: 18 18    Temp: 98.2 F (36.8 C)     TempSrc: Oral     SpO2: 100% 100%    Weight: 86.6 kg     Height:        Intake/Output Summary (Last 24 hours) at 08/31/2018 1446 Last data filed at 08/31/2018 1200 Gross per 24 hour  Intake 855.94 ml  Output 551 ml  Net 304.94 ml   Last 3 Weights 08/31/2018 08/30/2018 08/30/2018  Weight (lbs) 190 lb 14.7 oz 193 lb 9 oz 193 lb 2 oz  Weight (kg) 86.6 kg 87.8 kg 87.6 kg      Telemetry    SVT HR 140-150 - Personally Reviewed  ECG    See above - Personally Reviewed  Physical Exam   GEN: No acute distress.   Neck: supple Cardiac: rapid regular rhythm  Respiratory: Clear to auscultation anteriorly. GI: Soft  MS: No edema  Skin:  Warm and dry Psych: agitated at times   Labs    Chemistry Recent Labs  Lab 08/29/18 0659 08/30/18 0935 08/31/18 0546  NA 140 137 140  K  4.0 4.6 3.5  CL 106 106 107  CO2 23 21* 25  GLUCOSE 184* 334* 165*  BUN 8 16 12   CREATININE 0.69 0.95 0.77  CALCIUM 8.6* 9.1 8.4*  PROT 5.4*  --   --   ALBUMIN 2.4*  --   --   AST 21  --   --   ALT 34  --   --   ALKPHOS 68  --   --   BILITOT 0.7  --   --   GFRNONAA >60 >60 >60  GFRAA >60 >60 >60  ANIONGAP 11 10 8      Hematology Recent Labs  Lab 08/29/18 0659 08/30/18 0935 08/31/18 0546  WBC 11.0* 10.5 9.3  RBC 4.06* 4.41 4.06*  HGB 12.0* 12.7* 11.3*  HCT 36.1* 39.6 36.3*  MCV 88.9 89.8 89.4  MCH 29.6 28.8 27.8  MCHC 33.2 32.1 31.1  RDW 14.3 14.6 14.6  PLT 298 343 291    Cardiac Enzymes Recent Labs  Lab 08/30/18 0935  TROPONINI <0.03   No results for input(s): TROPIPOC in the last 168 hours.   BNPNo results for input(s): BNP, PROBNP in the last 168 hours.   DDimer No results for input(s): DDIMER in the last 168 hours.   Radiology    Ct Head W & Wo Contrast  Addendum Date: 08/30/2018   ADDENDUM REPORT: 08/30/2018 22:35 ADDENDUM: In addition to the  initially described findings, note made of a 2.2 cm mass within the superficial aspect of the left parotid gland, incompletely visualized. Nonemergent outpatient ENT referral for further workup and evaluation suggested for further evaluation. Electronically Signed   By: Jeannine Boga M.D.   On: 08/30/2018 22:35   Result Date: 08/30/2018 CLINICAL DATA:  Follow-up postsurgical evaluation status post meningioma resection. EXAM: CT HEAD WITHOUT AND WITH CONTRAST TECHNIQUE: Contiguous axial images were obtained from the base of the skull through the vertex without and with intravenous contrast CONTRAST:  2mL OMNIPAQUE IOHEXOL 300 MG/ML  SOLN COMPARISON:  Prior preoperative CT and MRI from 07/21/2018 FINDINGS: Brain: Examination degraded by motion artifact. Postoperative changes from prior parieto-occipital craniotomy for meningioma resection seen. Dural thickening and enhancement subjacent to the craniotomy bone flap most consistent with postoperative changes. Trace residual extra-axial/subdural collection measuring up to 2 mm seen just superficial to the dura. No significant mass effect. Previously seen meningioma positioned at the cerebral aqueduct has been resected. Probable small amount of residual blood products within the resection cavity. Hypodensity within the adjacent right greater than left splenium could reflect residual edema and/or Peri section ischemic change (series 3, image 20). The adjacent vein of Galen and internal cerebral veins deviated to the left, similar to previous. Suspected small amount of residual tumor adjacent to the vein of Galen, although difficult to discern by CT as any potential residual tumor and adjacent vein enhanced to similar attenuation. Small amount of degenerating blood products seen layering within the occipital horn of the right lateral ventricle. Trace residual pneumocephalus at the right frontal horn. Overall ventricular size perhaps slightly decreased from  preoperative exam. No significant midline shift. Basilar cisterns remain patent. Underlying age-related cerebral atrophy with chronic microvascular ischemic disease again noted. Remote left basal ganglia lacunar infarct unchanged. Mineralization/calcification at the lateral right thalamus unchanged. No other acute large vessel territory infarct. Vascular: Calcified atherosclerosis at the skull base. Normal intravascular enhancement seen within the intracranial circulation following contrast administration. Skull: Post craniotomy changes noted posteriorly. Right frontal burr hole from previous CBD noted. Scalp soft  tissues demonstrate no acute finding. Sinuses/Orbits: Globes and orbital soft tissues within normal limits. Paranasal sinuses are largely clear. No mastoid effusion. Other: None. IMPRESSION: 1. Postoperative changes from interval posterior craniotomy for meningioma resection. Probable small amount of residual blood products at the resection cavity. Parenchymal hypodensity within the adjacent splenium could reflect residual edema and/or peri resection ischemic changes. No obvious gross residual tumor, although possible small amount of residual tumor along the adjacent vein of Galen not excluded by CT. 2. Sequelae of prior right frontal EVD with trace residual pneumocephalus and small amount of residual intraventricular hemorrhage. Overall ventricular size perhaps slightly decreased from preoperative exam. 3. No other new acute intracranial abnormality. Underlying chronic microvascular ischemic changes with remote left basal ganglia lacunar infarct unchanged. Electronically Signed: By: Jeannine Boga M.D. On: 08/30/2018 22:18    Cardiac Studies   Echo 08/30/2018  1. The left ventricle has a visually estimated ejection fraction of of 55%. The cavity size was normal. There is moderately increased left ventricular wall thickness. Left ventricular diastolic Doppler parameters are consistent with  impaired relaxation  No evidence of left ventricular regional wall motion abnormalities.  2. The right ventricle has normal systolic function. The cavity was normal. There is no increase in right ventricular wall thickness.  3. Trivial pericardial effusion.  4. The mitral valve is normal in structure. No evidence of mitral valve stenosis. Mild regurgitation.  5. The tricuspid valve is normal in structure.  6. The aortic valve is tricuspid Aortic valve regurgitation is trivial by color flow Doppler. no stenosis of the aortic valve.  7. The aortic root and ascending aorta are normal in size and structure.  8. The inferior vena cava was normal in size with <50% respiratory variability.  9. No evidence of left ventricular regional wall motion abnormalities. 10. Right atrial pressure is estimated at 8 mmHg. 11. No complete TR doppler jet so unable to estimate PA systolic pressure.   Patient Profile     78 y.o. male with CAD s/p prior PCI, DM, HTN, HLP, COPD who recently underwent meningioma resection with IVH and SDH seen for SVT.  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  Assessment & Plan    1. SVT No response to Metoprolol 5 mg IV x 2. Will arrange for Adenosine 6 mg IVP.  D/w Dr. Johnsie Cancel. Adenosine 6 mg IVP given.  Rhythm converted to sinus rhythm, then sinus tachy. Will start Amiodarone - 150 mg IV bolus, then 60 mg per hour x 6 hours then 30 mg per hour.    For questions or updates, please contact [place] Please consult [email or web address] for contact info under      Signed, Richardson Dopp, PA-C  08/31/2018, 2:46 PM     See separate note regarding chemical cardioversion will start iv amiodarone in addition to  Continuing lopressor to try to prevent recurrence Obese white male encephalopathic  With mits on hands rhonchi at base distant heart sounds  Jenkins Rouge

## 2018-09-01 ENCOUNTER — Inpatient Hospital Stay (HOSPITAL_COMMUNITY): Payer: Medicare Other

## 2018-09-01 ENCOUNTER — Inpatient Hospital Stay (HOSPITAL_COMMUNITY): Payer: Managed Care, Other (non HMO) | Admitting: Physical Therapy

## 2018-09-01 ENCOUNTER — Inpatient Hospital Stay (HOSPITAL_COMMUNITY): Payer: Managed Care, Other (non HMO) | Admitting: Occupational Therapy

## 2018-09-01 DIAGNOSIS — G4733 Obstructive sleep apnea (adult) (pediatric): Secondary | ICD-10-CM

## 2018-09-01 LAB — BLOOD GAS, ARTERIAL
Acid-Base Excess: 2.5 mmol/L — ABNORMAL HIGH (ref 0.0–2.0)
Bicarbonate: 26.4 mmol/L (ref 20.0–28.0)
Drawn by: 301361
O2 Content: 4 L/min
O2 Saturation: 98.8 %
Patient temperature: 98.1
pCO2 arterial: 39.1 mmHg (ref 32.0–48.0)
pH, Arterial: 7.442 (ref 7.350–7.450)
pO2, Arterial: 137 mmHg — ABNORMAL HIGH (ref 83.0–108.0)

## 2018-09-01 LAB — COMPREHENSIVE METABOLIC PANEL
ALT: 34 U/L (ref 0–44)
AST: 17 U/L (ref 15–41)
Albumin: 2.2 g/dL — ABNORMAL LOW (ref 3.5–5.0)
Alkaline Phosphatase: 75 U/L (ref 38–126)
Anion gap: 8 (ref 5–15)
BUN: 8 mg/dL (ref 8–23)
CO2: 26 mmol/L (ref 22–32)
Calcium: 8.2 mg/dL — ABNORMAL LOW (ref 8.9–10.3)
Chloride: 106 mmol/L (ref 98–111)
Creatinine, Ser: 0.71 mg/dL (ref 0.61–1.24)
GFR calc Af Amer: >60 mL/min (ref >60)
GFR calc non Af Amer: >60 mL/min (ref >60)
Glucose, Bld: 167 mg/dL — ABNORMAL HIGH (ref 70–99)
POTASSIUM: 3.2 mmol/L — AB (ref 3.5–5.1)
SODIUM: 140 mmol/L (ref 135–145)
Total Bilirubin: 0.6 mg/dL (ref 0.3–1.2)
Total Protein: 5.3 g/dL — ABNORMAL LOW (ref 6.5–8.1)

## 2018-09-01 LAB — GLUCOSE, CAPILLARY
Glucose-Capillary: 144 mg/dL — ABNORMAL HIGH (ref 70–99)
Glucose-Capillary: 225 mg/dL — ABNORMAL HIGH (ref 70–99)
Glucose-Capillary: 122 mg/dL — ABNORMAL HIGH (ref 70–99)
Glucose-Capillary: 172 mg/dL — ABNORMAL HIGH (ref 70–99)

## 2018-09-01 LAB — MAGNESIUM: Magnesium: 1.7 mg/dL (ref 1.7–2.4)

## 2018-09-01 MED ORDER — SODIUM CHLORIDE 0.9 % IV SOLN
INTRAVENOUS | Status: DC | PRN
  Administered 2018-09-01: 250 mL via INTRAVENOUS

## 2018-09-01 MED ORDER — AMLODIPINE BESYLATE 2.5 MG PO TABS
2.5000 mg | ORAL_TABLET | Freq: Every day | ORAL | Status: DC
  Administered 2018-09-02 – 2018-09-04 (×3): 2.5 mg via ORAL
  Filled 2018-09-01 (×3): qty 1

## 2018-09-01 MED ORDER — MAGNESIUM SULFATE IN D5W 1-5 GM/100ML-% IV SOLN
1.0000 g | Freq: Once | INTRAVENOUS | Status: AC
  Administered 2018-09-02: 1 g via INTRAVENOUS
  Filled 2018-09-01: qty 100

## 2018-09-01 MED ORDER — METOPROLOL SUCCINATE ER 25 MG PO TB24: 25.0000 mg | ORAL_TABLET | Freq: Two times a day (BID) | ORAL | Status: DC

## 2018-09-01 MED ORDER — GLUCERNA SHAKE PO LIQD
237.0000 mL | Freq: Two times a day (BID) | ORAL | Status: DC
  Administered 2018-09-03 – 2018-09-04 (×3): 237 mL via ORAL

## 2018-09-01 MED ORDER — POTASSIUM CHLORIDE 10 MEQ/100ML IV SOLN
10.0000 meq | INTRAVENOUS | Status: AC
  Administered 2018-09-01 (×2): 10 meq via INTRAVENOUS
  Filled 2018-09-01 (×2): qty 100

## 2018-09-01 MED ORDER — HALOPERIDOL LACTATE 5 MG/ML IJ SOLN
2.0000 mg | Freq: Once | INTRAMUSCULAR | Status: AC
  Administered 2018-09-01: 2 mg via INTRAVENOUS
  Filled 2018-09-01: qty 1

## 2018-09-01 MED FILL — Medication: Qty: 1 | Status: AC

## 2018-09-01 NOTE — Progress Notes (Signed)
Social Work  Discharge Note  The overall goal for the admission was met for:   Discharge location: NO - pt transferred to acute due to medical issues  Length of Stay: No - (CIR team had anticipated LOS ~ 21 days)  Discharge activity level: No - (CIR team had set supervision to [place] assist goals)  Home/community participation: No  Services provided included: MD, RD, PT, OT, SLP, RN, Pharmacy and [place]: Medicare and Private Insurance: [employer]  Follow-up services arranged: NA  Comments (or additional information):  Pt transferred to acute after only being on CIR x 2 days.  Would anticipate pt to return to CIR once medically stabilized.  Patient/Family verbalized understanding of follow-up arrangements: NA Individual responsible for coordination of the follow-up plan: wife  Confirmed correct DME delivered: NA    Talisha Erby

## 2018-09-01 NOTE — Progress Notes (Addendum)
Patient continues with hallucinations, thinking that he is somewhere else.  He does not fixate or track, but does identify when a bright light was shone in his eye.     Etiology of encephalopathy/vision loss still slightly unclear. Post-operative delirium possible but time course is long. I wonder about venous infarct given the position of the veins in relation to the tumor. Edema related to surgery in this location certainly could be playing a role as well.   With history of sleep apnea, will get ABG to rule out CO2 narcosis as contributor.   MRI, EEG pending.  Roland Rack, MD [facility] [phone]  If 7pm- 7am, please page neurology on call as listed in [place].

## 2018-09-01 NOTE — Progress Notes (Signed)
Called to see patient  Patient Name: Jason Farrell. Date of Encounter: 09/01/2018  Primary Cardiologist:  New   Subjective   Pt sleeping   Arousable   Answering minimally   Appear comfortable   +sleep apnea with snoring   Inpatient Medications    Scheduled Meds: . adenosine (ADENOCARD) IV  12 mg Intravenous Once  . amLODipine  10 mg Oral Daily  . aspirin  81 mg Oral Daily  . atorvastatin  40 mg Oral Daily  . docusate sodium  100 mg Oral BID  . lisinopril  20 mg Oral Daily   And  . hydrochlorothiazide  12.5 mg Oral Daily  . insulin aspart  0-15 Units Subcutaneous TID WC  . insulin aspart  0-5 Units Subcutaneous QHS  . metoprolol tartrate  5 mg Intravenous Q6H  . pantoprazole  40 mg Oral Daily  . QUEtiapine  12.5 mg Oral Daily  . QUEtiapine  50 mg Oral QHS  . sodium chloride flush  3 mL Intravenous Q12H   Continuous Infusions: . sodium chloride 75 mL/hr at 08/31/18 2253  . amiodarone 30 mg/hr (09/01/18 0312)    Vital Signs    Vitals:   08/31/18 1805 08/31/18 2050 09/01/18 0102 09/01/18 0449  BP: 120/71 (!) 141/77 (!) 149/64 (!) 164/75  Pulse: 87 86 75 70  Resp:  18 20 20   Temp:  98.2 F (36.8 C)  97.8 F (36.6 C)  TempSrc:  Oral  Oral  SpO2:  100%  100%  Weight:    85.6 kg  Height:        Intake/Output Summary (Last 24 hours) at 09/01/2018 1133 Last data filed at 09/01/2018 1100 Gross per 24 hour  Intake 2238.07 ml  Output 1127 ml  Net 1111.07 ml   Last 3 Weights 09/01/2018 08/31/2018 08/30/2018  Weight (lbs) 188 lb 11.4 oz 190 lb 14.7 oz 193 lb 9 oz  Weight (kg) 85.6 kg 86.6 kg 87.8 kg      Telemetry    SR/ST   Personally Reviewed  ECG    See above - Personally Reviewed  Physical Exam   GEN: No acute distress.   Neck: supple Cardiac: rapid regular rhythm  Respiratory: Clear to auscultation anteriorly. GI: Soft  MS: No edema  Skin:  Warm and dry Psych: agitated at times   Labs    Chemistry Recent Labs  Lab 08/29/18 0659  08/30/18 0935 08/31/18 0546 09/01/18 0745  NA 140 137 140 140  K 4.0 4.6 3.5 3.2*  CL 106 106 107 106  CO2 23 21* 25 26  GLUCOSE 184* 334* 165* 167*  BUN 8 16 12 8   CREATININE 0.69 0.95 0.77 0.71  CALCIUM 8.6* 9.1 8.4* 8.2*  PROT 5.4*  --   --  5.3*  ALBUMIN 2.4*  --   --  2.2*  AST 21  --   --  17  ALT 34  --   --  34  ALKPHOS 68  --   --  75  BILITOT 0.7  --   --  0.6  GFRNONAA >60 >60 >60 >60  GFRAA >60 >60 >60 >60  ANIONGAP 11 10 8 8      Hematology Recent Labs  Lab 08/29/18 0659 08/30/18 0935 08/31/18 0546  WBC 11.0* 10.5 9.3  RBC 4.06* 4.41 4.06*  HGB 12.0* 12.7* 11.3*  HCT 36.1* 39.6 36.3*  MCV 88.9 89.8 89.4  MCH 29.6 28.8 27.8  MCHC 33.2 32.1 31.1  RDW 14.3 14.6  14.6  PLT 298 343 291    Cardiac Enzymes Recent Labs  Lab 08/30/18 0935  TROPONINI <0.03   No results for input(s): TROPIPOC in the last 168 hours.   BNPNo results for input(s): BNP, PROBNP in the last 168 hours.   DDimer No results for input(s): DDIMER in the last 168 hours.   Radiology    Ct Head W & Wo Contrast  Addendum Date: 08/30/2018   ADDENDUM REPORT: 08/30/2018 22:35 ADDENDUM: In addition to the initially described findings, note made of a 2.2 cm mass within the superficial aspect of the left parotid gland, incompletely visualized. Nonemergent outpatient ENT referral for further workup and evaluation suggested for further evaluation. Electronically Signed   By: Jeannine Boga M.D.   On: 08/30/2018 22:35   Result Date: 08/30/2018 CLINICAL DATA:  Follow-up postsurgical evaluation status post meningioma resection. EXAM: CT HEAD WITHOUT AND WITH CONTRAST TECHNIQUE: Contiguous axial images were obtained from the base of the skull through the vertex without and with intravenous contrast CONTRAST:  72mL OMNIPAQUE IOHEXOL 300 MG/ML  SOLN COMPARISON:  Prior preoperative CT and MRI from 07/21/2018 FINDINGS: Brain: Examination degraded by motion artifact. Postoperative changes from prior  parieto-occipital craniotomy for meningioma resection seen. Dural thickening and enhancement subjacent to the craniotomy bone flap most consistent with postoperative changes. Trace residual extra-axial/subdural collection measuring up to 2 mm seen just superficial to the dura. No significant mass effect. Previously seen meningioma positioned at the cerebral aqueduct has been resected. Probable small amount of residual blood products within the resection cavity. Hypodensity within the adjacent right greater than left splenium could reflect residual edema and/or Peri section ischemic change (series 3, image 20). The adjacent vein of Galen and internal cerebral veins deviated to the left, similar to previous. Suspected small amount of residual tumor adjacent to the vein of Galen, although difficult to discern by CT as any potential residual tumor and adjacent vein enhanced to similar attenuation. Small amount of degenerating blood products seen layering within the occipital horn of the right lateral ventricle. Trace residual pneumocephalus at the right frontal horn. Overall ventricular size perhaps slightly decreased from preoperative exam. No significant midline shift. Basilar cisterns remain patent. Underlying age-related cerebral atrophy with chronic microvascular ischemic disease again noted. Remote left basal ganglia lacunar infarct unchanged. Mineralization/calcification at the lateral right thalamus unchanged. No other acute large vessel territory infarct. Vascular: Calcified atherosclerosis at the skull base. Normal intravascular enhancement seen within the intracranial circulation following contrast administration. Skull: Post craniotomy changes noted posteriorly. Right frontal burr hole from previous CBD noted. Scalp soft tissues demonstrate no acute finding. Sinuses/Orbits: Globes and orbital soft tissues within normal limits. Paranasal sinuses are largely clear. No mastoid effusion. Other: None. IMPRESSION:  1. Postoperative changes from interval posterior craniotomy for meningioma resection. Probable small amount of residual blood products at the resection cavity. Parenchymal hypodensity within the adjacent splenium could reflect residual edema and/or peri resection ischemic changes. No obvious gross residual tumor, although possible small amount of residual tumor along the adjacent vein of Galen not excluded by CT. 2. Sequelae of prior right frontal EVD with trace residual pneumocephalus and small amount of residual intraventricular hemorrhage. Overall ventricular size perhaps slightly decreased from preoperative exam. 3. No other new acute intracranial abnormality. Underlying chronic microvascular ischemic changes with remote left basal ganglia lacunar infarct unchanged. Electronically Signed: By: Jeannine Boga M.D. On: 08/30/2018 22:18    Cardiac Studies   Echo 08/30/2018  1. The left ventricle has a  visually estimated ejection fraction of of 55%. The cavity size was normal. There is moderately increased left ventricular wall thickness. Left ventricular diastolic Doppler parameters are consistent with impaired relaxation  No evidence of left ventricular regional wall motion abnormalities.  2. The right ventricle has normal systolic function. The cavity was normal. There is no increase in right ventricular wall thickness.  3. Trivial pericardial effusion.  4. The mitral valve is normal in structure. No evidence of mitral valve stenosis. Mild regurgitation.  5. The tricuspid valve is normal in structure.  6. The aortic valve is tricuspid Aortic valve regurgitation is trivial by color flow Doppler. no stenosis of the aortic valve.  7. The aortic root and ascending aorta are normal in size and structure.  8. The inferior vena cava was normal in size with <50% respiratory variability.  9. No evidence of left ventricular regional wall motion abnormalities. 10. Right atrial pressure is estimated at 8  mmHg. 11. No complete TR doppler jet so unable to estimate PA systolic pressure.   Patient Profile     78 y.o. male with CAD s/p prior PCI, DM, HTN, HLP, COPD who recently underwent meningioma resection with IVH and SDH seen for SVT.  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  Assessment & Plan    1. SVT Pt with no further spells of SVT after yesteday    On IV amiodarone now  This is a reasonable short term solution until other medical issues stabilize/improve    Will increase b blocker Rx as well which may decrease irritability   2  CAD   No symtpoms to sugg active ischemia  3  HTN  BP is fair    4 OSA   Pt sleepign today   Apneic at times   Not using CPAP in am  5  Neuro   Rcent surgery   For questions or updates, please contact [facility] Please consult [email or web address] for contact info under      Signed, Dorris Carnes, MD  09/01/2018, 11:33 AM

## 2018-09-01 NOTE — Progress Notes (Signed)
EEG complete - results pending 

## 2018-09-01 NOTE — Progress Notes (Signed)
PROGRESS NOTE    Jason Farrell.  GTX:[id number] DOB: 1941-07-09 DOA: 08/30/2018 PCP: Steele Sizer, MD   Brief Narrative:  78 year old with history of CAD, essential hypertension, obstructive sleep apnea on CPAP, hyperlipidemia, diabetes mellitus type 2 recently underwent craniotomy for tumor resection on 08/11/2018.  He was in CIR when he developed increasing confusion, lethargy and SVT.  At that time he was found in SVT, cardiology tried Lopressor without any improvement and adenosine had to be given.  Eventually started on amiodarone drip.   Assessment & Plan:   Principal Problem:   SVT (supraventricular tachycardia) (HCC) Active Problems:   Essential hypertension   COLD (chronic obstructive lung disease) (HCC)   Obstructive sleep apnea on CPAP   Controlled type 2 diabetes mellitus without complication, without long-term current use of insulin (HCC)   Meningioma (HCC)   Nontraumatic intraventricular intracerebral hemorrhage (HCC)   PRES (posterior reversible encephalopathy syndrome)  Supraventricular tachycardia - Failed treatment with vagal maneuver and metoprolol therefore adenosine given.  Rate is better controlled -Continue amiodarone at this time -Cardiology following - TSH-0.76 - Not a candidate for anticoagulation due to intracranial bleeding.  History of PRES -Concerns of acute bilateral blindness?Marland Kitchen  Difficult to get full neurologic examination at this time -EEG and MRI of the brain is pending -Neurology team is following. -Maintain neuro checks. - CT of the head showed postsurgical changes posterior craniotomy for meningioma.  No acute abnormalities noted -Plans for modified barium swallow today.  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  Holding home p.o. medications.  Insulin sliding scale and  Accu-Chek.  Essential hypertension -Continue Norvasc 10 mg orally daily, Lopressor PRN.  Lisinopril 20 mg daily  History of depression - Continue Seroquel  Obstructive sleep apnea -CPAP   DVT prophylaxis: SCDs Code Status: Full code Family Communication: None at bedside Disposition Plan: Maintain inpatient stay for further cardiology and neurologic evaluation.  Consultants:   Cardiology  Procedures:   None  Antimicrobials:   None   Subjective: Patient is laying in the bed mumbling words.  He opens his eyes to his name.  Does not provide any meaningful history.  Follows very basic commands.  Review of Systems Otherwise negative except as per HPI, including: General: Denies fever, chills, night sweats or unintended weight loss. Resp: Denies cough, wheezing, shortness of breath. Cardiac: Denies chest pain, palpitations, orthopnea, paroxysmal nocturnal dyspnea. GI: Denies abdominal pain, nausea, vomiting, diarrhea or constipation GU: Denies dysuria, frequency, hesitancy or incontinence MS: Denies muscle aches, joint pain or swelling Neuro: Denies headache, neurologic deficits (focal weakness, numbness, tingling), abnormal gait Psych: Denies anxiety, depression, SI/HI/AVH Skin: Denies new rashes or lesions ID: Denies sick contacts, exotic exposures, travel  Objective: Vitals:   08/31/18 1805 08/31/18 2050 09/01/18 0102 09/01/18 0449  BP: 120/71 (!) 141/77 (!) 149/64 (!) 164/75  Pulse: 87 86 75 70  Resp:  18 20 20   Temp:  98.2 F (36.8 C)  97.8 F (36.6 C)  TempSrc:  Oral  Oral  SpO2:  100%  100%  Weight:    85.6 kg  Height:        Intake/Output Summary (Last 24 hours) at 09/01/2018 1101 Last data filed at 09/01/2018 1002 Gross per 24 hour  Intake 2238.07 ml  Output 1126 ml  Net 1112.07 ml   Filed Weights   08/30/18 1809 08/31/18 0439  09/01/18 0449  Weight: 87.8 kg 86.6 kg 85.6 kg    Examination:  General exam: Appears calm and comfortable, mittens  in place. Respiratory system: Clear to auscultation. Respiratory effort normal. Cardiovascular system: S1 & S2 heard, RRR. No JVD, murmurs, rubs, gallops or clicks. No pedal edema. Gastrointestinal system: Abdomen is nondistended, soft and nontender. No organomegaly or masses felt. Normal bowel sounds heard. Central nervous system: Alert and oriented. No focal neurological deficits. Extremities: Symmetric 4 x 5 power. Skin: Multiple excoriation marks noted on his skin. Psychiatry: Poor judgment, alert awake oriented X1 only to his name.    Data Reviewed:   CBC: Recent Labs  Lab 08/29/18 0659 08/30/18 0935 08/31/18 0546  WBC 11.0* 10.5 9.3  NEUTROABS 9.0*  --   --   HGB 12.0* 12.7* 11.3*  HCT 36.1* 39.6 36.3*  MCV 88.9 89.8 89.4  PLT 298 343 601   Basic Metabolic Panel: Recent Labs  Lab 08/29/18 0659 08/30/18 0935 08/31/18 0546 09/01/18 0745  NA 140 137 140 140  K 4.0 4.6 3.5 3.2*  CL 106 106 107 106  CO2 23 21* 25 26  GLUCOSE 184* 334* 165* 167*  BUN 8 16 12 8   CREATININE 0.69 0.95 0.77 0.71  CALCIUM 8.6* 9.1 8.4* 8.2*  MG  --   --   --  1.7   GFR: Estimated Creatinine Clearance: 81.8 mL/min (by C-G formula based on SCr of 0.71 mg/dL). Liver Function Tests: Recent Labs  Lab 08/29/18 0659 09/01/18 0745  AST 21 17  ALT 34 34  ALKPHOS 68 75  BILITOT 0.7 0.6  PROT 5.4* 5.3*  ALBUMIN 2.4* 2.2*   No results for input(s): LIPASE, AMYLASE in the last 168 hours. No results for input(s): AMMONIA in the last 168 hours. Coagulation Profile: No results for input(s): INR, PROTIME in the last 168 hours. Cardiac Enzymes: Recent Labs  Lab 08/30/18 0935  CKTOTAL 29*  CKMB 1.4  TROPONINI <0.03   BNP (last 3 results) No results for input(s): PROBNP in the last 8760 hours. HbA1C: No results for input(s): HGBA1C in the last 72 hours. CBG: Recent Labs  Lab 08/31/18 0757 08/31/18 1222 08/31/18 1645 08/31/18 2055 09/01/18 0744  GLUCAP 174* 198* 189* 182* 144*     Lipid Profile: No results for input(s): CHOL, HDL, LDLCALC, TRIG, CHOLHDL, LDLDIRECT in the last 72 hours. Thyroid Function Tests: Recent Labs    08/30/18 1554  TSH 0.763   Anemia Panel: No results for input(s): VITAMINB12, FOLATE, FERRITIN, TIBC, IRON, RETICCTPCT in the last 72 hours. Sepsis Labs: No results for input(s): PROCALCITON, LATICACIDVEN in the last 168 hours.  No results found for this or any previous visit (from the past 240 hour(s)).       Radiology Studies: Ct Head W & Wo Contrast  Addendum Date: 08/30/2018   ADDENDUM REPORT: 08/30/2018 22:35 ADDENDUM: In addition to the initially described findings, note made of a 2.2 cm mass within the superficial aspect of the left parotid gland, incompletely visualized. Nonemergent outpatient ENT referral for further workup and evaluation suggested for further evaluation. Electronically Signed   By: Jeannine Boga M.D.   On: 08/30/2018 22:35   Result Date: 08/30/2018 CLINICAL DATA:  Follow-up postsurgical evaluation status post meningioma resection. EXAM: CT HEAD WITHOUT AND WITH CONTRAST TECHNIQUE: Contiguous axial images were obtained from the base of the skull through the vertex without and with intravenous contrast CONTRAST:  43mL OMNIPAQUE IOHEXOL 300 MG/ML  SOLN COMPARISON:  Prior preoperative CT and  MRI from 07/21/2018 FINDINGS: Brain: Examination degraded by motion artifact. Postoperative changes from prior parieto-occipital craniotomy for meningioma resection seen. Dural thickening and enhancement subjacent to the craniotomy bone flap most consistent with postoperative changes. Trace residual extra-axial/subdural collection measuring up to 2 mm seen just superficial to the dura. No significant mass effect. Previously seen meningioma positioned at the cerebral aqueduct has been resected. Probable small amount of residual blood products within the resection cavity. Hypodensity within the adjacent right greater than left  splenium could reflect residual edema and/or Peri section ischemic change (series 3, image 20). The adjacent vein of Galen and internal cerebral veins deviated to the left, similar to previous. Suspected small amount of residual tumor adjacent to the vein of Galen, although difficult to discern by CT as any potential residual tumor and adjacent vein enhanced to similar attenuation. Small amount of degenerating blood products seen layering within the occipital horn of the right lateral ventricle. Trace residual pneumocephalus at the right frontal horn. Overall ventricular size perhaps slightly decreased from preoperative exam. No significant midline shift. Basilar cisterns remain patent. Underlying age-related cerebral atrophy with chronic microvascular ischemic disease again noted. Remote left basal ganglia lacunar infarct unchanged. Mineralization/calcification at the lateral right thalamus unchanged. No other acute large vessel territory infarct. Vascular: Calcified atherosclerosis at the skull base. Normal intravascular enhancement seen within the intracranial circulation following contrast administration. Skull: Post craniotomy changes noted posteriorly. Right frontal burr hole from previous CBD noted. Scalp soft tissues demonstrate no acute finding. Sinuses/Orbits: Globes and orbital soft tissues within normal limits. Paranasal sinuses are largely clear. No mastoid effusion. Other: None. IMPRESSION: 1. Postoperative changes from interval posterior craniotomy for meningioma resection. Probable small amount of residual blood products at the resection cavity. Parenchymal hypodensity within the adjacent splenium could reflect residual edema and/or peri resection ischemic changes. No obvious gross residual tumor, although possible small amount of residual tumor along the adjacent vein of Galen not excluded by CT. 2. Sequelae of prior right frontal EVD with trace residual pneumocephalus and small amount of residual  intraventricular hemorrhage. Overall ventricular size perhaps slightly decreased from preoperative exam. 3. No other new acute intracranial abnormality. Underlying chronic microvascular ischemic changes with remote left basal ganglia lacunar infarct unchanged. Electronically Signed: By: Jeannine Boga M.D. On: 08/30/2018 22:18        Scheduled Meds: . adenosine (ADENOCARD) IV  12 mg Intravenous Once  . amLODipine  10 mg Oral Daily  . aspirin  81 mg Oral Daily  . atorvastatin  40 mg Oral Daily  . docusate sodium  100 mg Oral BID  . lisinopril  20 mg Oral Daily   And  . hydrochlorothiazide  12.5 mg Oral Daily  . insulin aspart  0-15 Units Subcutaneous TID WC  . insulin aspart  0-5 Units Subcutaneous QHS  . metoprolol tartrate  5 mg Intravenous Q6H  . pantoprazole  40 mg Oral Daily  . QUEtiapine  12.5 mg Oral Daily  . QUEtiapine  50 mg Oral QHS  . sodium chloride flush  3 mL Intravenous Q12H   Continuous Infusions: . sodium chloride 75 mL/hr at 08/31/18 2253  . amiodarone 30 mg/hr (09/01/18 0312)     LOS: 2 days   Time spent= 35 mins    Thi Sisemore Arsenio Loader, MD [facility]  If 7PM-7AM, please contact night-coverage [email or web address] 09/01/2018, 11:01 AM

## 2018-09-01 NOTE — Progress Notes (Signed)
Initial Nutrition Assessment  DOCUMENTATION CODES:   Obesity unspecified  INTERVENTION:   - Advance diet to Heart Healthy/Carb Modified - Add Glucerna BID (each provides 220 kcal, 10 g protein)   NUTRITION DIAGNOSIS:   Inadequate oral intake related to acute illness as evidenced by estimated needs.  GOAL:   Patient will meet greater than or equal to 90% of their needs  MONITOR:   PO intake, Supplement acceptance, Labs, Weight trends  REASON FOR ASSESSMENT:   Malnutrition Screening Tool    ASSESSMENT:   78 yo male, admitted with supraventricular tachycardia. PMH significant for CAD, HTN, HLD, OSA on CPAP, DM, h/o brain tumor s/p craniotomy with tumor resection. Admitted to CIR on 2/13 with functional deficits.   Labs: potassium 3.2, glucose 167 mg/dL, corrected Ca WNL Meds: Lipitor, Colace, Novolog, Protonix EC, NS at 75 mL/hr  Pt asleep at time of visit. Woke briefly to his name being called, but immediately back to sleep. Per nsg, pt is doing much better than before, is more alert and oriented. Pt is hungry and ate a good breakfast today. Will order Glucerna BID.  Per SLP note, pt passed MBS and recommends regular solids and thin liquids.  NUTRITION - FOCUSED PHYSICAL EXAM: Deferred at this time - will need on follow up  Diet Order:  70% x 1 meal recorded 2/15: Dysphagia 3, thin liquids  Diet Order            Diet heart healthy/carb modified Room service appropriate? Yes; Fluid consistency: Thin  Diet effective now              EDUCATION NEEDS:  Not appropriate for education at this time  Skin:  Skin Assessment: Skin Integrity Issues: Skin Integrity Issues:: Incisions, Other (Comment) Incisions: posterior head Other: MASD groin; rash on back  Last BM:  2/16  Height:  Ht Readings from Last 1 Encounters:  08/30/18 5\' 6"  (1.676 m)    Weight:  Wt Readings:  09/01/18 85.6 kg  08/28/18 91.3 kg  07/21/18 99.8 kg  07/21/18 100.6 kg  07/04/18 101.8  kg  02/21/18 105 kg  Noted 15 kg wt loss x 6 weeks = 15% may be indicative of malnutrition  Ideal Body Weight:  64.5 kg  BMI:  Body mass index is 30.46 kg/m., obese  Estimated Nutritional Needs:   Kcal:  1900-2100  Protein:  95-110 gm  Fluid:  1 mL/kcal or per MD  Jason Grimmer, MS, RDN, LDN Pager: [phone] Available Mondays and Fridays, 9am-2pm

## 2018-09-01 NOTE — Procedures (Signed)
ELECTROENCEPHALOGRAM REPORT   Patient: Jason Farrell.       Room #: 3E05C EEG No. ID: [id number] Age: 78 y.o.        Sex: male Referring Physician: Aroor Report Date:  09/01/2018        Interpreting Physician: Alexis Goodell  History: Jason Farrell. is an 78 y.o. male with headaches and altered mental status  Medications:  Adenosine, Norvasc, ASA, Lipitor, Colace, HCTZ, Insulin, Prinivil, Toprol, Protonix, Lopressor, Seroquel, Amiodarone  Conditions of Recording:  This is a 21 channel routine scalp EEG performed with bipolar and monopolar montages arranged in accordance to the international 10/20 system of electrode placement. One channel was dedicated to EKG recording.  The patient is in the awake and drowsy states.  Description:  The waking background activity consists of a low voltage, symmetrical, fairly well organized, 8-9 Hz alpha activity, seen from the parieto-occipital and posterior temporal regions.  Low voltage fast activity, poorly organized, is seen anteriorly and is at times superimposed on more posterior regions.  A mixture of theta and alpha rhythms are seen from the central and temporal regions. The patient drowses with slowing to irregular, low voltage theta and beta activity.   Stage II sleep is not obtained. No epileptiform activity is noted.   Hyperventilation and intermittent photic stimulation were not performed.  IMPRESSION: This is a normal awake and drowsy electroencephalogram.  No epileptiform activity is noted.     Alexis Goodell, MD Neurology [phone] 09/01/2018, 6:19 PM

## 2018-09-01 NOTE — Progress Notes (Signed)
Modified Barium Swallow Progress Note  Patient Details  Name: Jason Farrell. MRN: [id number] Date of Birth: 10-17-1940  Today's Date: 09/01/2018  Modified Barium Swallow completed.  Full report located under Chart Review in the Imaging Section.  Brief recommendations include the following:  Clinical Impression  Pt presents with functional oropharyngeal swallow ability without aspiration of any consistency tested despite taxed to consume sequential boluses.  He did demonstrate minimal laryngeal penetration however which is WFL for his age.  Overall swallow was strong and timely with only minimal residuals *oropharyngeal* and cued dry swallow helpful to clear.  Recommend pt consume regular/thin diet.  Largest aspiration risk factor will be his respiratory and cognitive status - thus SlP will follow up to educate family/pt and assure tolerance.  Thanks for this consult.    Swallow Evaluation Recommendations       SLP Diet Recommendations: Regular solids;Thin liquid   Liquid Administration via: Cup;Straw   Medication Administration: Whole meds with liquid   Supervision: Patient able to self feed;Staff to assist with self feeding(vision deficits prevent him from feeding himself)   Compensations: Slow rate;Small sips/bites Intermittent dry swallows   Postural Changes: Remain semi-upright after after feeds/meals (Comment);Seated upright at 90 degrees   Oral Care Recommendations: Oral care BID      Luanna Salk, MS [facility] SLP Acute Rehab Services Pager [phone] Office [phone]   Macario Golds 09/01/2018,9:25 AM

## 2018-09-01 NOTE — Progress Notes (Signed)
Inpatient Rehabilitation Admissions Coordinator  Patient admitted to [facility] 08/28/2018 from [facility]. I will follow his progress to assist with planning dispo. Please reorder PT and OT when appropriate.  Danne Baxter, RN, MSN Rehab Admissions Coordinator [phone] 09/01/2018 2:40 PM

## 2018-09-01 NOTE — Progress Notes (Signed)
Patient no longer in enclosure bed, placed in regular bed and provided close monitoring for cardiac issues. No restraints in use.

## 2018-09-02 ENCOUNTER — Inpatient Hospital Stay (HOSPITAL_COMMUNITY): Payer: Medicare Other

## 2018-09-02 DIAGNOSIS — L8990 Pressure ulcer of unspecified site, unspecified stage: Secondary | ICD-10-CM

## 2018-09-02 LAB — COMPREHENSIVE METABOLIC PANEL
ALBUMIN: 2.1 g/dL — AB (ref 3.5–5.0)
ALT: 31 U/L (ref 0–44)
AST: 15 U/L (ref 15–41)
Alkaline Phosphatase: 76 U/L (ref 38–126)
Anion gap: 7 (ref 5–15)
BUN: 5 mg/dL — ABNORMAL LOW (ref 8–23)
CHLORIDE: 103 mmol/L (ref 98–111)
CO2: 28 mmol/L (ref 22–32)
Calcium: 8.4 mg/dL — ABNORMAL LOW (ref 8.9–10.3)
Creatinine, Ser: 0.75 mg/dL (ref 0.61–1.24)
GFR calc Af Amer: >60 mL/min (ref >60)
GFR calc non Af Amer: >60 mL/min (ref >60)
Glucose, Bld: 172 mg/dL — ABNORMAL HIGH (ref 70–99)
POTASSIUM: 3.5 mmol/L (ref 3.5–5.1)
Sodium: 138 mmol/L (ref 135–145)
Total Bilirubin: 0.5 mg/dL (ref 0.3–1.2)
Total Protein: 5.1 g/dL — ABNORMAL LOW (ref 6.5–8.1)

## 2018-09-02 LAB — MAGNESIUM: Magnesium: 1.9 mg/dL (ref 1.7–2.4)

## 2018-09-02 LAB — GLUCOSE, CAPILLARY
GLUCOSE-CAPILLARY: 161 mg/dL — AB (ref 70–99)
GLUCOSE-CAPILLARY: 160 mg/dL — AB (ref 70–99)
Glucose-Capillary: 180 mg/dL — ABNORMAL HIGH (ref 70–99)
Glucose-Capillary: 216 mg/dL — ABNORMAL HIGH (ref 70–99)

## 2018-09-02 MED ORDER — METOPROLOL SUCCINATE ER 25 MG PO TB24
25.0000 mg | ORAL_TABLET | Freq: Two times a day (BID) | ORAL | Status: DC
  Administered 2018-09-02 – 2018-09-04 (×5): 25 mg via ORAL
  Filled 2018-09-02 (×5): qty 1

## 2018-09-02 MED ORDER — POTASSIUM CHLORIDE 10 MEQ/100ML IV SOLN
10.0000 meq | INTRAVENOUS | Status: AC
  Administered 2018-09-02 (×2): 10 meq via INTRAVENOUS
  Filled 2018-09-02 (×2): qty 100

## 2018-09-02 MED ORDER — POTASSIUM CHLORIDE CRYS ER 20 MEQ PO TBCR
40.0000 meq | EXTENDED_RELEASE_TABLET | Freq: Once | ORAL | Status: AC
  Administered 2018-09-02: 40 meq via ORAL
  Filled 2018-09-02: qty 2

## 2018-09-02 MED ORDER — FUROSEMIDE 10 MG/ML IJ SOLN
60.0000 mg | Freq: Once | INTRAMUSCULAR | Status: AC
  Administered 2018-09-02: 60 mg via INTRAVENOUS
  Filled 2018-09-02: qty 6

## 2018-09-02 MED ORDER — INSULIN ASPART 100 UNIT/ML ~~LOC~~ SOLN
0.0000 [IU] | Freq: Three times a day (TID) | SUBCUTANEOUS | Status: DC
  Administered 2018-09-02 (×3): 3 [IU] via SUBCUTANEOUS
  Administered 2018-09-03: 2 [IU] via SUBCUTANEOUS
  Administered 2018-09-03: 5 [IU] via SUBCUTANEOUS
  Administered 2018-09-03 – 2018-09-04 (×2): 3 [IU] via SUBCUTANEOUS
  Administered 2018-09-04: 5 [IU] via SUBCUTANEOUS

## 2018-09-02 MED ORDER — STROKE: EARLY STAGES OF RECOVERY BOOK
Freq: Once | Status: DC
  Filled 2018-09-02: qty 1

## 2018-09-02 MED ORDER — AMIODARONE HCL 200 MG PO TABS
200.0000 mg | ORAL_TABLET | Freq: Two times a day (BID) | ORAL | Status: DC
  Administered 2018-09-02 – 2018-09-04 (×4): 200 mg via ORAL
  Filled 2018-09-02 (×4): qty 1

## 2018-09-02 MED ORDER — LORAZEPAM 2 MG/ML IJ SOLN
1.0000 mg | Freq: Once | INTRAMUSCULAR | Status: AC
  Administered 2018-09-03: 1 mg via INTRAVENOUS
  Filled 2018-09-02: qty 1

## 2018-09-02 MED ORDER — GADOBUTROL 1 MMOL/ML IV SOLN
8.5000 mL | Freq: Once | INTRAVENOUS | Status: AC | PRN
  Administered 2018-09-02: 8.5 mL via INTRAVENOUS

## 2018-09-02 MED ORDER — POTASSIUM CHLORIDE 10 MEQ/100ML IV SOLN
10.0000 meq | INTRAVENOUS | Status: DC
  Filled 2018-09-02: qty 100

## 2018-09-02 NOTE — Progress Notes (Signed)
PROGRESS NOTE    Jason Farrell.  PPJ:[id number] DOB: [birth year] DOA: 08/30/2018 PCP: Steele Sizer, MD   Brief Narrative:  78 year old with history of CAD, essential hypertension, obstructive sleep apnea on CPAP, hyperlipidemia, diabetes mellitus type 2 recently underwent craniotomy for tumor resection on 08/11/2018.  He was in CIR when he developed increasing confusion, lethargy and SVT.  At that time he was found in SVT, cardiology tried Lopressor without any improvement and adenosine had to be given.  Eventually started on amiodarone drip.Patient is EEG was normal, no epileptiform activity was noted.  Electrolytes were adjusted during the hospitalization.  MRI of the brain is still pending.   Assessment & Plan:   Principal Problem:   SVT (supraventricular tachycardia) (HCC) Active Problems:   Essential hypertension   COLD (chronic obstructive lung disease) (HCC)   Obstructive sleep apnea on CPAP   Controlled type 2 diabetes mellitus without complication, without long-term current use of insulin (HCC)   Meningioma (HCC)   Nontraumatic intraventricular intracerebral hemorrhage (HCC)   PRES (posterior reversible encephalopathy syndrome)   Pressure injury of skin  Supraventricular tachycardia, greatly improved - Failed treatment with vagal maneuver and metoprolol therefore adenosine given.  Rate is better controlled -On amiodarone drip at this time.  Transition to oral when deemed appropriate by cardiology.  Beta-blockers have been increased.  Toprol-XL 25 mg twice daily -Aggressively monitor electrolytes. -Cardiology following - TSH-0.76 - Not a candidate for anticoagulation due to intracranial bleeding.  History of PRES -Concerns of acute bilateral blindness?Marland Kitchen  Difficult to get full neurologic examination at this time -EEG EEG-negative, advised to get MRI this morning as his mentation appears to be little better. -Neurology team is following. -Maintain neuro checks. -  CT of the head showed postsurgical changes posterior craniotomy for meningioma.  No acute abnormalities noted -Plans for modified barium swallow today.  Acute metabolic encephalopathy, suspect delirium intermittently -This morning his mentation is better.  Off-and-on gets confused and tries to pull his IVs.  This morning eating his breakfast on his own and does not have any complaints.  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  Holding home p.o. medications.  Insulin sliding scale and Accu-Chek.  Essential hypertension -Continue Norvasc 10 mg orally daily, Lopressor PRN.  Lisinopril 20 mg daily  History of depression - Continue Seroquel  Obstructive sleep apnea -CPAP, would benefit from outpatient sleep study.   DVT prophylaxis: SCDs Code Status: Full code Family Communication: None at bedside Disposition Plan: Maintain inpatient stay until we ensure his heart rate remained stable and is cleared by cardiology and neurology  Consultants:   Cardiology  Neurology  Procedures:   None  Antimicrobials:   None   Subjective: Patient is sitting up in his bed eating his breakfast on his own.  Able to answer all the questions appropriately and denies any complaints at the moment.  Still remains on amiodarone drip.  Review of Systems Otherwise negative except as per HPI, including: General = no fevers, chills, dizziness, malaise, fatigue HEENT/EYES = negative for pain, redness, loss of vision, double vision, blurred vision, loss of hearing, sore throat, hoarseness, dysphagia Cardiovascular= negative for chest pain, palpitation, murmurs, lower extremity swelling Respiratory/lungs= negative for shortness of breath, cough, hemoptysis, wheezing, mucus production Gastrointestinal= negative for nausea, vomiting,, abdominal pain,  melena, hematemesis Genitourinary= negative for Dysuria, Hematuria, Change in Urinary Frequency  MSK = Negative for arthralgia, myalgias, Back Pain, Joint swelling  Neurology= Negative for headache, seizures, numbness, tingling  Psychiatry= Negative for anxiety, depression, suicidal and homocidal ideation Allergy/Immunology= Medication/Food allergy as listed  Skin= Negative for Rash, lesions, ulcers, itching   Objective: Vitals:   09/01/18 2333 09/01/18 2345 09/02/18 0435 09/02/18 0800  BP: 117/63  137/70 137/68  Pulse: 76  77 75  Resp:   18 19  Temp:   98.7 F (37.1 C) 97.9 F (36.6 C)  TempSrc:   Oral Oral  SpO2: 100% 98% 100% 100%  Weight:   86.2 kg   Height:        Intake/Output Summary (Last 24 hours) at 09/02/2018 1059 Last data filed at 09/02/2018 1000 Gross per 24 hour  Intake 2277.61 ml  Output 2002 ml  Net 275.61 ml   Filed Weights   08/31/18 0439 09/01/18 0449 09/02/18 0435  Weight: 86.6 kg 85.6 kg 86.2 kg    Examination:  Constitutional: NAD, calm, comfortable Eyes: PERRL, lids and conjunctivae normal ENMT: Mucous membranes are moist. Posterior pharynx clear of any exudate or lesions.Normal dentition.  Neck: normal, supple, no masses, no thyromegaly Respiratory: clear to auscultation bilaterally, no wheezing, no crackles. Normal respiratory effort. No accessory muscle use.  Cardiovascular: Regular rate and rhythm, no murmurs / rubs / gallops. No extremity edema. 2+ pedal pulses. No carotid bruits.  Abdomen: no tenderness, no masses palpated. No hepatosplenomegaly. Bowel sounds positive.  Musculoskeletal: no clubbing / cyanosis. No joint deformity upper and lower extremities. Good ROM, no contractures. Normal muscle tone.  Skin: no rashes, lesions, ulcers. No induration Neurologic: CN 2-12 grossly intact. Sensation intact, DTR normal. Strength 4/5 in all 4.  Psychiatric: Poor judgment and insight. Alert and oriented x 32. Normal mood.    Data Reviewed:    CBC: Recent Labs  Lab 08/29/18 0659 08/30/18 0935 08/31/18 0546  WBC 11.0* 10.5 9.3  NEUTROABS 9.0*  --   --   HGB 12.0* 12.7* 11.3*  HCT 36.1* 39.6 36.3*  MCV 88.9 89.8 89.4  PLT 298 343 621   Basic Metabolic Panel: Recent Labs  Lab 08/29/18 0659 08/30/18 0935 08/31/18 0546 09/01/18 0745 09/02/18 0450  NA 140 137 140 140 138  K 4.0 4.6 3.5 3.2* 3.5  CL 106 106 107 106 103  CO2 23 21* 25 26 28   GLUCOSE 184* 334* 165* 167* 172*  BUN 8 16 12 8  5*  CREATININE 0.69 0.95 0.77 0.71 0.75  CALCIUM 8.6* 9.1 8.4* 8.2* 8.4*  MG  --   --   --  1.7 1.9   GFR: Estimated Creatinine Clearance: 82.2 mL/min (by C-G formula based on SCr of 0.75 mg/dL). Liver Function Tests: Recent Labs  Lab 08/29/18 0659 09/01/18 0745 09/02/18 0450  AST 21 17 15   ALT 34 34 31  ALKPHOS 68 75 76  BILITOT 0.7 0.6 0.5  PROT 5.4* 5.3* 5.1*  ALBUMIN 2.4* 2.2* 2.1*   No results for input(s): LIPASE, AMYLASE in the last 168 hours. No results for input(s): AMMONIA in the last 168 hours. Coagulation Profile: No results for input(s): INR, PROTIME in the last 168 hours. Cardiac Enzymes: Recent Labs  Lab 08/30/18 0935  CKTOTAL 29*  CKMB 1.4  TROPONINI <0.03   BNP (last 3 results) No results for input(s): PROBNP in the last 8760 hours. HbA1C: No results for input(s): HGBA1C in the last 72 hours. CBG: Recent Labs  Lab 09/01/18 0744 09/01/18 1153 09/01/18 1629 09/01/18 2101 09/02/18 3086  GLUCAP 144* 225* 122* 172* 161*   Lipid Profile: No results for input(s): CHOL, HDL, LDLCALC, TRIG, CHOLHDL, LDLDIRECT in the last 72 hours. Thyroid Function Tests: Recent Labs    08/30/18 1554  TSH 0.763   Anemia Panel: No results for input(s): VITAMINB12, FOLATE, FERRITIN, TIBC, IRON, RETICCTPCT in the last 72 hours. Sepsis Labs: No results for input(s): PROCALCITON, LATICACIDVEN in the last 168 hours.  No results found for this or any previous visit (from the past 240 hour(s)).        Radiology Studies: Dg Swallowing Func-speech Pathology  Result Date: 09/01/2018 Objective Swallowing Evaluation: Type of Study: MBS-Modified Barium Swallow Study  Patient Details Name: Jason Farrell. MRN: [id number] Date of Birth: 07/11/1941 Today's Date: 09/01/2018 Time: SLP Start Time (ACUTE ONLY): [phone] -SLP Stop Time (ACUTE ONLY): 0902 SLP Time Calculation (min) (ACUTE ONLY): 16 min Past Medical History: Past Medical History: Diagnosis Date . Coronary artery disease  . Diabetes mellitus without complication ([place])  . Dyslipidemia  . GERD (gastroesophageal reflux disease)  . Hyperlipidemia  . Hypertension  . MI (myocardial infarction) ([place])  . Osteoarthritis  Past Surgical History: Past Surgical History: Procedure Laterality Date . CARDIAC CATHETERIZATION  2006  stent placement . COLONOSCOPY   . CORONARY ANGIOPLASTY WITH STENT PLACEMENT  2006  stent x 2  . CORONARY ANGIOPLASTY WITH STENT PLACEMENT  1996  stent x 3  . FOOT SURGERY Right  . HALLUX VALGUS CHEILECTOMY Right 02/25/2015  Procedure: HALLUX VALGUS CHEILECTOMY;  Surgeon: [name], DPM;  Location: [facility] ORS;  Service: Podiatry;  Laterality: Right; HPI: Patient is a 78 year old male with history ofCAD, T2DM, HTN, OSA--compliant with CPAP who was evaluated in the ED01/06/20with3-4 week history of intermittent bouts of confusionwith lability,headaches, difficulty speaking and visual changes. 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 He was referred to [facility] for evaluation and underwent Craniotomy withresection of pineal region massand placement of EVDon 08/11/2017 by Dr. [name]. Post procedure treated with decadron and IV Keppra X 7 days. Drain removed with 1/30 and on 1/31 he developed decrease in LOC requiring transfer to ICU. 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 MRI brain  showed small amount of hemorrhage at resection site with focal hemorrhage/clot within right ventricle and unchanged appearance of increased T2/Flair signal. CTA head/neck done reveling narrow/diminshed flow in right V4 segment question due to dissection, thrombus or vasospasm. Patient also with resultant confusion with agitation and loss of bilateral vision. Neurology felt that patient'sMS changes were felt to bedue to delirium and did not feel seizures were cause -- d/c Keppra after 7 days. Repeat CT head showed no change in small left parietal SDH with moderate R>L ventriculomegaly and no significant interval change. Ophthalmology (Dr. 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   Plans were to admit patient to CIR on 02/07 but he developed increase in confusion and lethargy.CBC showed rise in WBC to 30 and LP/BC done for work up. CSF/BC negative.Repeat CT head showedimprovement in intraventricular blood and intraventricular air. Leucocytosis felt to be due to steroids.Seroquel added at nights to help with sleep wake disruption and Zyprexa being used prn agitation during the day. He developed SVT with HR up to 150's on 2/11 and lopressor added with resolution. On dysphagia 3 diet with question  of aspiration.   He continues to be confused, is oriented to self, year and situation.  He has visual deficits and responds to visual threat at times, continues to wax and wane with poor safety awarenss and impulsivity.  He was subsequently transferred from CIR due to SVT.  Patient with CT head showing Postoperative changes from interval posterior craniotomy for  Subjective: pt awake sitting in chair Assessment / Plan / Recommendation CHL IP CLINICAL IMPRESSIONS 09/01/2018 Clinical Impression Pt presents with functional oropharyngeal swallow ability without aspiration of any consistency tested despite taxed to consume  sequential boluses.  He did demonstrate minimal laryngeal penetration however.  Overall swallow was strong and timely with only minimal residuals *oropharyngeal* and cued dry swallow helpful to clear.  Recommend pt consume regular/thin diet.  Largest aspiration risk factor will be his respiratory and cognitive status - thus SlP will follow up to educate family/pt and assure tolerance.  Thanks for this consult.  SLP Visit Diagnosis -- Attention and concentration deficit following -- Frontal lobe and executive function deficit following -- Impact on safety and function --   CHL IP TREATMENT RECOMMENDATION 09/01/2018 Treatment Recommendations Therapy as outlined in treatment plan below   Prognosis 09/01/2018 Prognosis for Safe Diet Advancement Good Barriers to Reach Goals -- Barriers/Prognosis Comment -- CHL IP DIET RECOMMENDATION 09/01/2018 SLP Diet Recommendations Regular solids;Thin liquid Liquid Administration via Cup;Straw Medication Administration Whole meds with liquid Compensations Slow rate;Small sips/bites Postural Changes Remain semi-upright after after feeds/meals (Comment);Seated upright at 90 degrees   CHL IP OTHER RECOMMENDATIONS 09/01/2018 Recommended Consults -- Oral Care Recommendations Oral care BID Other Recommendations --   CHL IP FOLLOW UP RECOMMENDATIONS 08/31/2018 Follow up Recommendations Other (comment)   CHL IP FREQUENCY AND DURATION 09/01/2018 Speech Therapy Frequency (ACUTE ONLY) min 1 x/week Treatment Duration 1 week      CHL IP ORAL PHASE 09/01/2018 Oral Phase WFL Oral - Pudding Teaspoon -- Oral - Pudding Cup -- Oral - Honey Teaspoon -- Oral - Honey Cup -- Oral - Nectar Teaspoon WFL Oral - Nectar Cup WFL Oral - Nectar Straw -- Oral - Thin Teaspoon WFL Oral - Thin Cup WFL Oral - Thin Straw WFL Oral - Puree WFL Oral - Mech Soft WFL Oral - Regular -- Oral - Multi-Consistency -- Oral - Pill WFL Oral Phase - Comment --  CHL IP PHARYNGEAL PHASE 09/01/2018 Pharyngeal Phase Impaired Pharyngeal- Pudding  Teaspoon -- Pharyngeal -- Pharyngeal- Pudding Cup -- Pharyngeal -- Pharyngeal- Honey Teaspoon -- Pharyngeal -- Pharyngeal- Honey Cup -- Pharyngeal -- Pharyngeal- Nectar Teaspoon WFL Pharyngeal -- Pharyngeal- Nectar Cup WFL Pharyngeal -- Pharyngeal- Nectar Straw -- Pharyngeal -- Pharyngeal- Thin Teaspoon WFL;Penetration/Aspiration during swallow Pharyngeal -- Pharyngeal- Thin Cup [facility];Penetration/Aspiration during swallow Pharyngeal Material enters airway, remains ABOVE vocal cords then ejected out Pharyngeal- Thin Straw WFL;Penetration/Aspiration during swallow Pharyngeal Material enters airway, remains ABOVE vocal cords then ejected out Pharyngeal- Puree WFL Pharyngeal -- Pharyngeal- Mechanical Soft WFL Pharyngeal -- Pharyngeal- Regular -- Pharyngeal -- Pharyngeal- Multi-consistency -- Pharyngeal -- Pharyngeal- Pill WFL Pharyngeal -- Pharyngeal Comment minimal pharyngeal residuals present that pt did not sense but cued dry swallows helpful to decrease, trace laryngeal penetration of lqiuids - no aspiration noted despite sequential boluses provided  CHL IP CERVICAL ESOPHAGEAL PHASE 09/01/2018 Cervical Esophageal Phase WFL Pudding Teaspoon -- Pudding Cup -- Honey Teaspoon -- Honey Cup -- Nectar Teaspoon -- Nectar Cup -- Nectar Straw -- Thin Teaspoon -- Thin Cup -- Thin Straw -- Puree -- Mechanical Soft -- Regular -- Multi-consistency --  Pill -- Cervical Esophageal Comment -- Jason Farrell 09/01/2018, 2:10 PM  Luanna Salk, MS [facility] SLP Acute Rehab Services Pager [phone] Office [phone]                  Scheduled Meds: . adenosine (ADENOCARD) IV  12 mg Intravenous Once  . amLODipine  2.5 mg Oral Daily  . aspirin  81 mg Oral Daily  . atorvastatin  40 mg Oral Daily  . docusate sodium  100 mg Oral BID  . feeding supplement (GLUCERNA SHAKE)  237 mL Oral BID BM  . lisinopril  20 mg Oral Daily   And  . hydrochlorothiazide  12.5 mg Oral Daily  . insulin aspart  0-15 Units Subcutaneous TID WC  .  insulin aspart  0-5 Units Subcutaneous QHS  . metoprolol succinate  25 mg Oral BID WC  . metoprolol tartrate  5 mg Intravenous Q6H  . pantoprazole  40 mg Oral Daily  . potassium chloride  40 mEq Oral Once  . QUEtiapine  12.5 mg Oral Daily  . QUEtiapine  50 mg Oral QHS  . sodium chloride flush  3 mL Intravenous Q12H   Continuous Infusions: . sodium chloride 20 mL/hr at 09/02/18 0356  . amiodarone 30 mg/hr (09/02/18 0435)     LOS: 3 days   Time spent= 25 mins    Rickard Kennerly Arsenio Loader, MD [facility]  If 7PM-7AM, please contact night-coverage [email or web address] 09/02/2018, 10:59 AM

## 2018-09-02 NOTE — Progress Notes (Addendum)
Called to see patient  Patient Name: Hamid Brookens. Date of Encounter: 09/02/2018  Primary Cardiologist:  New   Subjective  Patient says breathing is OK   Laying relatively flat in bed   Pt with regular diet today   Has mittens on  Inpatient Medications    Scheduled Meds: . adenosine (ADENOCARD) IV  12 mg Intravenous Once  . amLODipine  2.5 mg Oral Daily  . aspirin  81 mg Oral Daily  . atorvastatin  40 mg Oral Daily  . docusate sodium  100 mg Oral BID  . feeding supplement (GLUCERNA SHAKE)  237 mL Oral BID BM  . lisinopril  20 mg Oral Daily   And  . hydrochlorothiazide  12.5 mg Oral Daily  . insulin aspart  0-15 Units Subcutaneous TID WC  . insulin aspart  0-5 Units Subcutaneous QHS  . metoprolol succinate  25 mg Oral BID WC  . metoprolol tartrate  5 mg Intravenous Q6H  . pantoprazole  40 mg Oral Daily  . QUEtiapine  12.5 mg Oral Daily  . QUEtiapine  50 mg Oral QHS  . sodium chloride flush  3 mL Intravenous Q12H   Continuous Infusions: . sodium chloride 20 mL/hr at 09/02/18 0356  . amiodarone 30 mg/hr (09/02/18 0435)  . potassium chloride      Vital Signs    Vitals:   09/01/18 2333 09/01/18 2345 09/02/18 0435 09/02/18 0800  BP: 117/63  137/70 137/68  Pulse: 76  77 75  Resp:   18 19  Temp:   98.7 F (37.1 C) 97.9 F (36.6 C)  TempSrc:   Oral Oral  SpO2: 100% 98% 100% 100%  Weight:   86.2 kg   Height:        Intake/Output Summary (Last 24 hours) at 09/02/2018 0827 Last data filed at 09/02/2018 0356 Gross per 24 hour  Intake 2177.61 ml  Output 1577 ml  Net 600.61 ml   Last 3 Weights 09/02/2018 09/01/2018 08/31/2018  Weight (lbs) 190 lb 0.6 oz 188 lb 11.4 oz 190 lb 14.7 oz  Weight (kg) 86.2 kg 85.6 kg 86.6 kg      Telemetry     SR   No SVT   Personally Reviewed  ECG      Physical Exam   GEN: No acute distress.   Neck: Neck is full   Cardiac: RRR   NO S3   Respiratory: Rhonchi bilaterally   GI: Soft  MS: Tr edema  Skin:  Warm and  dry Psych: agitated at times   Labs    Chemistry Recent Labs  Lab 08/29/18 0659  08/31/18 0546 09/01/18 0745 09/02/18 0450  NA 140   < > 140 140 138  K 4.0   < > 3.5 3.2* 3.5  CL 106   < > 107 106 103  CO2 23   < > 25 26 28   GLUCOSE 184*   < > 165* 167* 172*  BUN 8   < > 12 8 5*  CREATININE 0.69   < > 0.77 0.71 0.75  CALCIUM 8.6*   < > 8.4* 8.2* 8.4*  PROT 5.4*  --   --  5.3* 5.1*  ALBUMIN 2.4*  --   --  2.2* 2.1*  AST 21  --   --  17 15  ALT 34  --   --  34 31  ALKPHOS 68  --   --  75 76  BILITOT 0.7  --   --  0.6 0.5  GFRNONAA >60   < > >60 >60 >60  GFRAA >60   < > >60 >60 >60  ANIONGAP 11   < > 8 8 7    < > = values in this interval not displayed.     Hematology Recent Labs  Lab 08/29/18 0659 08/30/18 0935 08/31/18 0546  WBC 11.0* 10.5 9.3  RBC 4.06* 4.41 4.06*  HGB 12.0* 12.7* 11.3*  HCT 36.1* 39.6 36.3*  MCV 88.9 89.8 89.4  MCH 29.6 28.8 27.8  MCHC 33.2 32.1 31.1  RDW 14.3 14.6 14.6  PLT 298 343 291    Cardiac Enzymes Recent Labs  Lab 08/30/18 0935  TROPONINI <0.03   No results for input(s): TROPIPOC in the last 168 hours.   BNPNo results for input(s): BNP, PROBNP in the last 168 hours.   DDimer No results for input(s): DDIMER in the last 168 hours.   Radiology    Dg Swallowing Func-speech Pathology  Result Date: 09/01/2018 Objective Swallowing Evaluation: Type of Study: MBS-Modified Barium Swallow Study  Patient Details Name: Estil Vallee. MRN: [id number] Date of Birth: 1940/11/29 Today's Date: 09/01/2018 Time: SLP Start Time (ACUTE ONLY): [phone] -SLP Stop Time (ACUTE ONLY): 0902 SLP Time Calculation (min) (ACUTE ONLY): 16 min Past Medical History: Past Medical History: Diagnosis Date . Coronary artery disease  . Diabetes mellitus without complication ([place])  . Dyslipidemia  . GERD (gastroesophageal reflux disease)  . Hyperlipidemia  . Hypertension  . MI (myocardial infarction) ([place])  . Osteoarthritis  Past Surgical History: Past Surgical  History: Procedure Laterality Date . CARDIAC CATHETERIZATION  2006  stent placement . COLONOSCOPY   . CORONARY ANGIOPLASTY WITH STENT PLACEMENT  2006  stent x 2  . CORONARY ANGIOPLASTY WITH STENT PLACEMENT  1996  stent x 3  . FOOT SURGERY Right  . HALLUX VALGUS CHEILECTOMY Right 02/25/2015  Procedure: HALLUX VALGUS CHEILECTOMY;  Surgeon: [name], DPM;  Location: [facility] ORS;  Service: Podiatry;  Laterality: Right; HPI: Patient is a 78 year old male with history ofCAD, T2DM, HTN, OSA--compliant with CPAP who was evaluated in the ED01/06/20with3-4 week history of intermittent bouts of confusionwith lability,headaches, difficulty speaking and visual changes. 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 He was referred to [facility] for evaluation and underwent Craniotomy withresection of pineal region massand placement of EVDon 08/11/2017 by Dr. [name]. Post procedure treated with decadron and IV Keppra X 7 days. Drain removed with 1/30 and on 1/31 he developed decrease in LOC requiring transfer to ICU. 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 MRI brain showed small amount of hemorrhage at resection site with focal hemorrhage/clot within right ventricle and unchanged appearance of increased T2/Flair signal. CTA head/neck done reveling narrow/diminshed flow in right V4 segment question due to dissection, thrombus or vasospasm. Patient also with resultant confusion with agitation and loss of bilateral vision. Neurology felt that patient'sMS changes were felt to bedue to delirium and did not feel seizures were cause -- d/c Keppra after 7 days. Repeat CT head showed no change in small left parietal SDH with moderate R>L ventriculomegaly and no significant interval change. Ophthalmology (Dr. [name]) consulted and felt that visual loss cortical in nature due  to lack of ophthalmologic finding --likely due to PRES over PION.   Plans were to admit patient to CIR on 02/07 but he developed increase in confusion and lethargy.CBC showed  rise in WBC to 30 and LP/BC done for work up. CSF/BC negative.Repeat CT head showedimprovement in intraventricular blood and intraventricular air. Leucocytosis felt to be due to steroids.Seroquel added at nights to help with sleep wake disruption and Zyprexa being used prn agitation during the day. He developed SVT with HR up to 150's on 2/11 and lopressor added with resolution. On dysphagia 3 diet with question of aspiration.   He continues to be confused, is oriented to self, year and situation.  He has visual deficits and responds to visual threat at times, continues to wax and wane with poor safety awarenss and impulsivity.  He was subsequently transferred from CIR due to SVT.  Patient with CT head showing Postoperative changes from interval posterior craniotomy for  Subjective: pt awake sitting in chair Assessment / Plan / Recommendation CHL IP CLINICAL IMPRESSIONS 09/01/2018 Clinical Impression Pt presents with functional oropharyngeal swallow ability without aspiration of any consistency tested despite taxed to consume sequential boluses.  He did demonstrate minimal laryngeal penetration however.  Overall swallow was strong and timely with only minimal residuals *oropharyngeal* and cued dry swallow helpful to clear.  Recommend pt consume regular/thin diet.  Largest aspiration risk factor will be his respiratory and cognitive status - thus SlP will follow up to educate family/pt and assure tolerance.  Thanks for this consult.  SLP Visit Diagnosis -- Attention and concentration deficit following -- Frontal lobe and executive function deficit following -- Impact on safety and function --   CHL IP TREATMENT RECOMMENDATION 09/01/2018 Treatment Recommendations Therapy as outlined in treatment plan below   Prognosis 09/01/2018 Prognosis for  Safe Diet Advancement Good Barriers to Reach Goals -- Barriers/Prognosis Comment -- CHL IP DIET RECOMMENDATION 09/01/2018 SLP Diet Recommendations Regular solids;Thin liquid Liquid Administration via Cup;Straw Medication Administration Whole meds with liquid Compensations Slow rate;Small sips/bites Postural Changes Remain semi-upright after after feeds/meals (Comment);Seated upright at 90 degrees   CHL IP OTHER RECOMMENDATIONS 09/01/2018 Recommended Consults -- Oral Care Recommendations Oral care BID Other Recommendations --   CHL IP FOLLOW UP RECOMMENDATIONS 08/31/2018 Follow up Recommendations Other (comment)   CHL IP FREQUENCY AND DURATION 09/01/2018 Speech Therapy Frequency (ACUTE ONLY) min 1 x/week Treatment Duration 1 week      CHL IP ORAL PHASE 09/01/2018 Oral Phase WFL Oral - Pudding Teaspoon -- Oral - Pudding Cup -- Oral - Honey Teaspoon -- Oral - Honey Cup -- Oral - Nectar Teaspoon WFL Oral - Nectar Cup WFL Oral - Nectar Straw -- Oral - Thin Teaspoon WFL Oral - Thin Cup WFL Oral - Thin Straw WFL Oral - Puree WFL Oral - Mech Soft WFL Oral - Regular -- Oral - Multi-Consistency -- Oral - Pill WFL Oral Phase - Comment --  CHL IP PHARYNGEAL PHASE 09/01/2018 Pharyngeal Phase Impaired Pharyngeal- Pudding Teaspoon -- Pharyngeal -- Pharyngeal- Pudding Cup -- Pharyngeal -- Pharyngeal- Honey Teaspoon -- Pharyngeal -- Pharyngeal- Honey Cup -- Pharyngeal -- Pharyngeal- Nectar Teaspoon WFL Pharyngeal -- Pharyngeal- Nectar Cup WFL Pharyngeal -- Pharyngeal- Nectar Straw -- Pharyngeal -- Pharyngeal- Thin Teaspoon WFL;Penetration/Aspiration during swallow Pharyngeal -- Pharyngeal- Thin Cup [facility];Penetration/Aspiration during swallow Pharyngeal Material enters airway, remains ABOVE vocal cords then ejected out Pharyngeal- Thin Straw WFL;Penetration/Aspiration during swallow Pharyngeal Material enters airway, remains ABOVE vocal cords then ejected out Pharyngeal- Puree WFL Pharyngeal -- Pharyngeal- Mechanical Soft WFL Pharyngeal --  Pharyngeal- Regular -- Pharyngeal -- Pharyngeal- Multi-consistency -- Pharyngeal -- Pharyngeal- Pill WFL Pharyngeal -- Pharyngeal Comment minimal pharyngeal residuals present that pt did not sense but  cued dry swallows helpful to decrease, trace laryngeal penetration of lqiuids - no aspiration noted despite sequential boluses provided  CHL IP CERVICAL ESOPHAGEAL PHASE 09/01/2018 Cervical Esophageal Phase WFL Pudding Teaspoon -- Pudding Cup -- Honey Teaspoon -- Honey Cup -- Nectar Teaspoon -- Nectar Cup -- Nectar Straw -- Thin Teaspoon -- Thin Cup -- Thin Straw -- Puree -- Mechanical Soft -- Regular -- Multi-consistency -- Pill -- Cervical Esophageal Comment -- Macario Golds 09/01/2018, 2:10 PM  Luanna Salk, MS [facility] SLP Acute Rehab Services Pager [phone] Office [phone]              Cardiac Studies   Echo 08/30/2018  1. The left ventricle has a visually estimated ejection fraction of of 55%. The cavity size was normal. There is moderately increased left ventricular wall thickness. Left ventricular diastolic Doppler parameters are consistent with impaired relaxation  No evidence of left ventricular regional wall motion abnormalities.  2. The right ventricle has normal systolic function. The cavity was normal. There is no increase in right ventricular wall thickness.  3. Trivial pericardial effusion.  4. The mitral valve is normal in structure. No evidence of mitral valve stenosis. Mild regurgitation.  5. The tricuspid valve is normal in structure.  6. The aortic valve is tricuspid Aortic valve regurgitation is trivial by color flow Doppler. no stenosis of the aortic valve.  7. The aortic root and ascending aorta are normal in size and structure.  8. The inferior vena cava was normal in size with <50% respiratory variability.  9. No evidence of left ventricular regional wall motion abnormalities. 10. Right atrial pressure is estimated at 8 mmHg. 11. No complete TR doppler jet so unable to  estimate PA systolic pressure.   Patient Profile     78 y.o. male with CAD s/p prior PCI, DM, HTN, HLP, COPD who recently underwent meningioma resection with IVH and SDH seen for SVT.  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  Assessment & Plan    1. SVT  No recurrence   With neuro events will try to control medically for now  Continue amiodarone  Switch to PO    2  CAD   No symtpoms to sugg active ischemia Will give 1 dose IV lasix  Follow I/O  3  HTN  BP is fair  Just got first dose of metoprolol PO today    4 OSA     5  Neuro   Rcent surgery   For questions or updates, please contact [facility] Please consult [email or web address] for contact info under      Signed, Dorris Carnes, MD  09/02/2018, 8:27 AM

## 2018-09-02 NOTE — Progress Notes (Signed)
  Speech Language Pathology Treatment: Dysphagia  Patient Details Name: Jason Farrell. MRN: [id number] DOB: 04-08-1941 Today's Date: 09/02/2018 Time: [id number] SLP Time Calculation (min) (ACUTE ONLY): 10 min  Assessment / Plan / Recommendation Clinical Impression  Pt initially demonstrated coughing after sips of water via straw with impulsive rate of consumption noted. On MBS on previous date he tolerated even sequential boluses well, but there was no mention of coughing. SLP provided Mod verbal and tactile cues for pt to slow his pacing, which eliminated any further signs of possible airway compromise. Recommend continuing regular solids and thin liquids with supervision to assist with aspiration precautions.    HPI HPI: Pt is a 77 yo male, admitted from CIR due to SVT. PMH significant for CAD, HTN, HLD, OSA on CPAP, DM, h/o brain tumor s/p craniotomy with tumor resection ([place] 2020). Previous BSE on CIR recommended Dys 3 diet and thin liquids.      SLP Plan  Continue with current plan of care       Recommendations  Diet recommendations: Regular;Thin liquid Liquids provided via: Straw;Cup Medication Administration: Whole meds with liquid Supervision: Staff to assist with self feeding;Full supervision/cueing for compensatory strategies Compensations: Slow rate;Small sips/bites Postural Changes and/or Swallow Maneuvers: Seated upright 90 degrees                Oral Care Recommendations: Oral care BID Follow up Recommendations: Inpatient Rehab SLP Visit Diagnosis: Dysphagia, unspecified (R13.10) Plan: Continue with current plan of care       GO                Talbert Nan 09/02/2018, 4:38 PM  Germain Osgood Edgardo Petrenko, M.A. [place] Acute Environmental education officer [phone] Office [phone]

## 2018-09-02 NOTE — Care Management Important Message (Signed)
Important Message  Patient Details  Name: Jason Farrell. MRN: [id number] Date of Birth: 1940/10/26   Medicare Important Message Given:  Yes    Delorse Lek 09/02/2018, 12:07 PM

## 2018-09-02 NOTE — Discharge Summary (Signed)
Physician Discharge Summary  Patient ID: Jason Farrell. MRN: [id number] DOB/AGE: [birth year] 78 y.o.  Admit date: 08/28/2018 Discharge date: 08/30/2018  Discharge Diagnoses:  Principal Problem:   Debility Active Problems:   CAD in native artery   Mass of pineal region   Meningioma [facility])   Nontraumatic intraventricular intracerebral hemorrhage (HCC)   Acute blood loss anemia   Hypoalbuminemia due to protein-calorie malnutrition (HCC)   Hypokalemia   Leukocytosis   Encephalopathy   SVT (supraventricular tachycardia) ([place])   Discharged Condition: Unstable   Significant Diagnostic Studies: Ct Head W & Wo Contrast  Addendum Date: 08/30/2018   ADDENDUM REPORT: 08/30/2018 22:35 ADDENDUM: In addition to the initially described findings, note made of a 2.2 cm mass within the superficial aspect of the left parotid gland, incompletely visualized. Nonemergent outpatient ENT referral for further workup and evaluation suggested for further evaluation. Electronically Signed   By: Jeannine Boga M.D.   On: 08/30/2018 22:35   Result Date: 08/30/2018 CLINICAL DATA:  Follow-up postsurgical evaluation status post meningioma resection. EXAM: CT HEAD WITHOUT AND WITH CONTRAST TECHNIQUE: Contiguous axial images were obtained from the base of the skull through the vertex without and with intravenous contrast CONTRAST:  67mL OMNIPAQUE IOHEXOL 300 MG/ML  SOLN COMPARISON:  Prior preoperative CT and MRI from 07/21/2018 FINDINGS: Brain: Examination degraded by motion artifact. Postoperative changes from prior parieto-occipital craniotomy for meningioma resection seen. Dural thickening and enhancement subjacent to the craniotomy bone flap most consistent with postoperative changes. Trace residual extra-axial/subdural collection measuring up to 2 mm seen just superficial to the dura. No significant mass effect. Previously seen meningioma positioned at the cerebral aqueduct has been resected. Probable small  amount of residual blood products within the resection cavity. Hypodensity within the adjacent right greater than left splenium could reflect residual edema and/or Peri section ischemic change (series 3, image 20). The adjacent vein of Galen and internal cerebral veins deviated to the left, similar to previous. Suspected small amount of residual tumor adjacent to the vein of Galen, although difficult to discern by CT as any potential residual tumor and adjacent vein enhanced to similar attenuation. Small amount of degenerating blood products seen layering within the occipital horn of the right lateral ventricle. Trace residual pneumocephalus at the right frontal horn. Overall ventricular size perhaps slightly decreased from preoperative exam. No significant midline shift. Basilar cisterns remain patent. Underlying age-related cerebral atrophy with chronic microvascular ischemic disease again noted. Remote left basal ganglia lacunar infarct unchanged. Mineralization/calcification at the lateral right thalamus unchanged. No other acute large vessel territory infarct. Vascular: Calcified atherosclerosis at the skull base. Normal intravascular enhancement seen within the intracranial circulation following contrast administration. Skull: Post craniotomy changes noted posteriorly. Right frontal burr hole from previous CBD noted. Scalp soft tissues demonstrate no acute finding. Sinuses/Orbits: Globes and orbital soft tissues within normal limits. Paranasal sinuses are largely clear. No mastoid effusion. Other: None. IMPRESSION: 1. Postoperative changes from interval posterior craniotomy for meningioma resection. Probable small amount of residual blood products at the resection cavity. Parenchymal hypodensity within the adjacent splenium could reflect residual edema and/or peri resection ischemic changes. No obvious gross residual tumor, although possible small amount of residual tumor along the adjacent vein of Galen not  excluded by CT. 2. Sequelae of prior right frontal EVD with trace residual pneumocephalus and small amount of residual intraventricular hemorrhage. Overall ventricular size perhaps slightly decreased from preoperative exam. 3. No other new acute intracranial abnormality. Underlying chronic microvascular ischemic changes with remote left  basal ganglia lacunar infarct unchanged. Electronically Signed: By: Jeannine Boga M.D. On: 08/30/2018 22:18   Dg Chest Port 1 View  Result Date: 08/28/2018 CLINICAL DATA:  Cough. EXAM: PORTABLE CHEST 1 VIEW COMPARISON:  07/21/2018 FINDINGS: The heart is within normal limits in size and stable. There is tortuosity and calcification of the thoracic aorta. Chronic bronchitic lung changes but no definite infiltrates, effusions or edema. The bony structures are intact. Stable degenerative changes involving both shoulders. IMPRESSION: No acute cardiopulmonary findings. Electronically Signed   By: Marijo Sanes M.D.   On: 08/28/2018 19:42      Labs:  Basic Metabolic Panel: Recent Labs  Lab 08/29/18 0659 08/30/18 0935  NA 140 137  K 4.0 4.6  CL 106 106  CO2 23 21*  GLUCOSE 184* 334*  BUN 8 16  CREATININE 0.69 0.95  CALCIUM 8.6* 9.1  MG  --   --     CBC: Lab 08/29/18 0659 08/30/18 0935  WBC 11.0* 10.5  NEUTROABS 9.0*  --   HGB 12.0* 12.7*  HCT 36.1* 39.6  MCV 88.9 89.8  PLT 298 343     Brief HPI:   Jason Farrell, Jason Bonito. is a 78 year old male with history of CAD, HTN, HLD, OSA on CPAP, DM; 3 to 4-week history of intermittent bouts of confusion with lability, headaches, difficulty speaking and visual changes.  Work-up revealed large meningioma at level of cerebral aqueduct with vasogenic edema posterior medial temporal lobes and obstructive hydrocephalus.  He underwent craniotomy with resection of pineal region mass and placement of EVD on 08/11/18 by Dr. Beatriz Stallion at [facility].  Postop course significant for decrease in level of consciousness due to new  IVH with focal hemorrhage/clot within right ventricle.  CTA head/neck done revealing narrowed/diminished flow in right V4 segment question due to dissection thrombus or vasospasm.  He continued to have issues with confusion and agitation as well as loss of bilateral vision.    Neurology felt patient's mental status changes were due to delirium rather than seizures.  Ophthalmology was consulted for input and felt that visual loss was cortical in nature likely due to PRES for PION.  He did develop increasing confusion with lethargy and leukocytosis on 08/22/18 and full work-up done.  LP/blood cultures negative for infection.  Repeat CT showed improvement intraventricular blood intraventricular air.  Leukocytosis was felt to be due to steroids.  He did develop SVT with heart rates up to 150s on 08/26/18 and Lopressor was added with improvement in rate control.  Seroquel was added at bedtime to help with sleep wake disruption and Zyprexa was being used to help manage agitation.  He continued to be limited by confusion, visual deficits poor safety awareness and impulsivity.  He was making progress with therapy and CIR was recommended due to functional decline.   Hospital Course: Jason Farrell. was admitted to rehab 08/28/2018 for inpatient therapies to consist of PT, ST and OT at least three hours five days a week. Past admission physiatrist, therapy team and rehab RN have worked together to provide customized collaborative inpatient rehab.  At admission patient was noted to have issues with disorientation as well as poor awareness of his loss of vision as well as visual hallucinations. His diabetes was monitored with ac/hs CBG checks.  Blood pressures and heart rate were monitored on a twice daily basis.  CBC done past admission showed mild leukocytosis.  Chest x-ray done for evaluation was negative for acute disease.  Cranial incision was noted to  be healing well without any signs or symptoms of infection.   He was started on potassium supplements at admission due to hypokalemia noted on preadmission labs.    OT evaluation revealed patient requiring max assist with basic self-care tasks and mod assist with mobility.  He exhibited severe cognitive impairments affecting attention problem-solving recall as well as severe visual deficits with language of confusion.  On 2/15,  patient developed SVT with heart rates up to 160.  He was treated with IV Lopressor as well as IV fluids without improvement.  EKG done revealing RBBB and Cardiology was consulted for input and he received a dose of  adenosine with conversion to sinus tachycardia. He was discharged to telemetry for closer monitoring and further work-up.      Medications at discharge: 1.  Coated aspirin 81 mg p.o. daily 2.  Lovenox 40 mg subcu daily 3.  Metoprolol 25 mg p.o. qid.  4.  Seroquel 50 mg p.o. nightly 5.  Protonix 40 mg p.o. per day 6.  K-Dur 20 mEq p.o. twice daily 7.  Zyprexa 5 mg p.o. twice daily PRN  Disposition: Acute hospital      Signed: Bary Leriche 09/02/2018, 10:35 AM

## 2018-09-02 NOTE — Progress Notes (Signed)
Subjective: Mental status appears much improved  Exam: Vitals:   09/02/18 1232 09/02/18 1600  BP: (!) 150/79 139/68  Pulse: 90 84  Resp: 20 19  Temp: 98.6 F (37 C) 97.8 F (36.6 C)  SpO2: 100% 99%   Gen: In bed, NAD Resp: non-labored breathing, no acute distress Abd: soft, nt  Neuro: MS: Awake, knows it is February and 2020, but not where he is.  CN: He is able to identify finger movement in both hemifields.  He fixates and tracks, and shakes my hand by seeing it. Motor: 5/5 throughout Sensory: Intact light touch  Impression: 78 year old male with area of ischemia adjacent to the tumor bed.  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  I do not think he needs any significant stroke work-up given the etiology is perioperative.  I do think that PT, OT, ST would be beneficial.  Recommendations: 1) PT, OT, ST 2) continue conservative management of delirium with lights on during the day, lights off and minimize stimulation at night. 3) likely to need continued rehab.  Roland Rack, MD [facility] [phone]  If 7pm- 7am, please page neurology on call as listed in [place].

## 2018-09-03 LAB — GLUCOSE, CAPILLARY
Glucose-Capillary: 195 mg/dL — ABNORMAL HIGH (ref 70–99)
Glucose-Capillary: 243 mg/dL — ABNORMAL HIGH (ref 70–99)
Glucose-Capillary: 125 mg/dL — ABNORMAL HIGH (ref 70–99)
Glucose-Capillary: 144 mg/dL — ABNORMAL HIGH (ref 70–99)

## 2018-09-03 LAB — COMPREHENSIVE METABOLIC PANEL
ALT: 30 U/L (ref 0–44)
ANION GAP: 9 (ref 5–15)
AST: 14 U/L — ABNORMAL LOW (ref 15–41)
Albumin: 2.3 g/dL — ABNORMAL LOW (ref 3.5–5.0)
Alkaline Phosphatase: 83 U/L (ref 38–126)
BUN: <5 mg/dL — ABNORMAL LOW (ref 8–23)
CO2: 30 mmol/L (ref 22–32)
Calcium: 8.7 mg/dL — ABNORMAL LOW (ref 8.9–10.3)
Chloride: 101 mmol/L (ref 98–111)
Creatinine, Ser: 0.8 mg/dL (ref 0.61–1.24)
GFR calc non Af Amer: >60 mL/min (ref >60)
Glucose, Bld: 188 mg/dL — ABNORMAL HIGH (ref 70–99)
Potassium: 4.1 mmol/L (ref 3.5–5.1)
Sodium: 140 mmol/L (ref 135–145)
Total Bilirubin: 0.8 mg/dL (ref 0.3–1.2)
Total Protein: 5.9 g/dL — ABNORMAL LOW (ref 6.5–8.1)

## 2018-09-03 LAB — MAGNESIUM: Magnesium: 1.7 mg/dL (ref 1.7–2.4)

## 2018-09-03 MED ORDER — ADULT MULTIVITAMIN W/MINERALS CH
1.0000 | ORAL_TABLET | Freq: Every day | ORAL | Status: DC
  Administered 2018-09-03 – 2018-09-04 (×2): 1 via ORAL
  Filled 2018-09-03 (×2): qty 1

## 2018-09-03 MED ORDER — LORAZEPAM 2 MG/ML IJ SOLN
1.0000 mg | Freq: Once | INTRAMUSCULAR | Status: AC
  Administered 2018-09-04: 1 mg via INTRAVENOUS
  Filled 2018-09-03: qty 1

## 2018-09-03 NOTE — Progress Notes (Signed)
Subjective: He did not sleep at all last night, becoming agitated.  Exam: Vitals:   09/03/18 0510 09/03/18 1203  BP: 115/63 126/63  Pulse: 91 85  Resp: 18 18  Temp: (!) 97 F (36.1 C)   SpO2: 98% 98%   Gen: In bed, NAD Resp: non-labored breathing, no acute distress Abd: soft, nt  Neuro: MS: Awake, knows it is February and 2020, but not where he is.  CN: He is able to identify finger movement in both hemifields.  He fixates and tracks, and shakes my hand by seeing it. Motor: 5/5 throughout Sensory: Intact light touch  Impression: 78 year old male with area of ischemia adjacent to the tumor bed.  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  I do not think he needs any significant stroke work-up given the etiology is perioperative.  He appeared to sundown last night, becoming agitated and I suspect that his worsening today is secondary to lack of sleep last night.    Recommendations: 1) PT, OT, ST 2) continue conservative management of delirium with lights on during the day, lights off and minimize stimulation at night. 3) continue nightly Seroquel 4) he would likely need to continue rehab, please call with further questions or concerns.  Roland Rack, MD [facility] [phone]  If 7pm- 7am, please page neurology on call as listed in [place].

## 2018-09-03 NOTE — Progress Notes (Signed)
Called to see patient  Patient Name: Jason Farrell. Date of Encounter: 09/03/2018  Primary Cardiologist:  New   Subjective  Pt more awake this AM  Sitting in bed  No CP  No dizzines  No SOB  Inpatient Medications    Scheduled Meds: .  stroke: mapping our early stages of recovery book   Does not apply Once  . adenosine (ADENOCARD) IV  12 mg Intravenous Once  . amiodarone  200 mg Oral BID  . amLODipine  2.5 mg Oral Daily  . aspirin  81 mg Oral Daily  . atorvastatin  40 mg Oral Daily  . docusate sodium  100 mg Oral BID  . feeding supplement (GLUCERNA SHAKE)  237 mL Oral BID BM  . lisinopril  20 mg Oral Daily   And  . hydrochlorothiazide  12.5 mg Oral Daily  . insulin aspart  0-15 Units Subcutaneous TID WC  . insulin aspart  0-5 Units Subcutaneous QHS  . metoprolol succinate  25 mg Oral BID WC  . pantoprazole  40 mg Oral Daily  . QUEtiapine  12.5 mg Oral Daily  . QUEtiapine  50 mg Oral QHS  . sodium chloride flush  3 mL Intravenous Q12H   Continuous Infusions: . sodium chloride 20 mL/hr at 09/02/18 0356    Vital Signs    Vitals:   09/02/18 1232 09/02/18 1600 09/02/18 1928 09/03/18 0510  BP: (!) 150/79 139/68 133/84 115/63  Pulse: 90 84 89 91  Resp: 20 19 18 18   Temp: 98.6 F (37 C) 97.8 F (36.6 C) 98.4 F (36.9 C) (!) 97 F (36.1 C)  TempSrc: Oral Oral Oral Oral  SpO2: 100% 99% 98% 98%  Weight:    86 kg  Height:        Intake/Output Summary (Last 24 hours) at 09/03/2018 0909 Last data filed at 09/03/2018 0500 Gross per 24 hour  Intake 460 ml  Output 1075 ml  Net -615 ml   Last 3 Weights 09/03/2018 09/02/2018 09/01/2018  Weight (lbs) 189 lb 9.5 oz 190 lb 0.6 oz 188 lb 11.4 oz  Weight (kg) 86 kg 86.2 kg 85.6 kg      Telemetry     SR   No SVT   Personally Reviewed  ECG      Physical Exam   GEN: No acute distress.   Neck: Neck is full   Cardiac: RRR   NO S3   Respiratory: Rhonchi bilaterally   GI: Soft  MS: Tr edema  Skin:  Warm and  dry Psych: agitated at times   Labs    Chemistry Recent Labs  Lab 09/01/18 0745 09/02/18 0450 09/03/18 0329  NA 140 138 140  K 3.2* 3.5 4.1  CL 106 103 101  CO2 26 28 30   GLUCOSE 167* 172* 188*  BUN 8 5* <5*  CREATININE 0.71 0.75 0.80  CALCIUM 8.2* 8.4* 8.7*  PROT 5.3* 5.1* 5.9*  ALBUMIN 2.2* 2.1* 2.3*  AST 17 15 14*  ALT 34 31 30  ALKPHOS 75 76 83  BILITOT 0.6 0.5 0.8  GFRNONAA >60 >60 >60  GFRAA >60 >60 >60  ANIONGAP 8 7 9      Hematology Recent Labs  Lab 08/29/18 0659 08/30/18 0935 08/31/18 0546  WBC 11.0* 10.5 9.3  RBC 4.06* 4.41 4.06*  HGB 12.0* 12.7* 11.3*  HCT 36.1* 39.6 36.3*  MCV 88.9 89.8 89.4  MCH 29.6 28.8 27.8  MCHC 33.2 32.1 31.1  RDW 14.3 14.6 14.6  PLT 298 343 291    Cardiac Enzymes Recent Labs  Lab 08/30/18 0935  TROPONINI <0.03   No results for input(s): TROPIPOC in the last 168 hours.   BNPNo results for input(s): BNP, PROBNP in the last 168 hours.   DDimer No results for input(s): DDIMER in the last 168 hours.   Radiology    Mr Jeri Cos Wo Contrast  Result Date: 09/02/2018 CLINICAL DATA:  78 y/o  M; status post meningioma resection. EXAM: MRI HEAD WITHOUT AND WITH CONTRAST TECHNIQUE: Multiplanar, multiecho pulse sequences of the brain and surrounding structures were obtained without and with intravenous contrast. CONTRAST:  8.5 cc Gadavist. COMPARISON:  08/30/2018 CT head.  07/21/2018 MRI head. FINDINGS: Brain: Resection cavity superior to the cerebellum and within the splenium of corpus callosum contains T1 hyperintense blood products with susceptibility blooming and smooth thin linear enhancement at the margins compatible postoperative changes. The inferomedial cavity margin there is a nodular focus of enhancement measuring 14 x 11 x 10 mm with similar signal characteristics to the meningioma prior MRI compatible with residual tumor (series 11, image 22 and series 14, image 13). Residual tumor is associated with the vein of Galen as  well as the origins of internal cerebral veins. Within the splenium of corpus callosum and right forceps of corpus callosum there is reduced diffusion compatible with ischemia. Stable ventricle size. Trace volume of hemorrhage is present within the occipital horn of the right lateral ventricle. There is a thin extradural postop fluid collection subjacent to parieto-occipital craniotomy without associated mass effect. There is an additional thin 4 mm subdural collection over the left parietal lobe. Trace volume of hemorrhage along the tentorium/posterior falx. No mass effect or herniation. Small chronic lacunar infarct within the left basal ganglia with hemosiderin staining. Stable punctate nonspecific T2 FLAIR hyperintensities in subcortical and periventricular white matter are compatible with mild chronic microvascular ischemic changes. Stable mild volume loss of the brain. Vascular: Normal flow voids. Skull and upper cervical spine: Normal marrow signal. Sinuses/Orbits: Negative. Other: None. IMPRESSION: 1. Postoperative changes related to parieto-occipital craniotomy and resection of meningioma. Small volume of associated intracranial hemorrhage without mass effect. 2. 14 mm focus of residual nodular enhancement at the inferomedial cavity margin compatible with residual tumor. 3. Acute ischemia with splenium in right forceps of splenium of corpus callosum. 4. Stable lateral and third ventricle size. No new focus of mass effect. No herniation. Electronically Signed   By: Kristine Garbe M.D.   On: 09/02/2018 13:42    Cardiac Studies   Echo 08/30/2018  1. The left ventricle has a visually estimated ejection fraction of of 55%. The cavity size was normal. There is moderately increased left ventricular wall thickness. Left ventricular diastolic Doppler parameters are consistent with impaired relaxation  No evidence of left ventricular regional wall motion abnormalities.  2. The right ventricle has  normal systolic function. The cavity was normal. There is no increase in right ventricular wall thickness.  3. Trivial pericardial effusion.  4. The mitral valve is normal in structure. No evidence of mitral valve stenosis. Mild regurgitation.  5. The tricuspid valve is normal in structure.  6. The aortic valve is tricuspid Aortic valve regurgitation is trivial by color flow Doppler. no stenosis of the aortic valve.  7. The aortic root and ascending aorta are normal in size and structure.  8. The inferior vena cava was normal in size with <50% respiratory variability.  9. No evidence of left ventricular regional wall motion abnormalities.  10. Right atrial pressure is estimated at 8 mmHg. 11. No complete TR doppler jet so unable to estimate PA systolic pressure.   Patient Profile     78 y.o. male with CAD s/p prior PCI, DM, HTN, HLP, COPD who recently underwent meningioma resection with IVH and SDH seen for SVT.  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  Assessment & Plan    1. SVT  Remains in SR   ON PO amidarone    I would keep on 200 bid of po amiodarone for now    I will make sure that he has outpt follow up for reassessment   Plan to taper to 200 as outpt     2  CAD   No symtpoms to sugg active ischemia Pt got 1 dose of lasix yesterday  Volume is a little better    3  HTN  BP is OK   Cont current meds    4 OSA   Should have CPAP  5  Neuro   Mental status is improving     Will sign off for now   Again, I will arrange outpt appt for f/u   Keep on current dose of mes  Call for questions    For questions or updates, please contact [facility] Please consult [email or web address] for contact info under      Signed, Dorris Carnes, MD  09/03/2018, 9:09 AM

## 2018-09-03 NOTE — Evaluation (Signed)
Physical Therapy Evaluation Patient Details Name: Jason Farrell. MRN: [id number] DOB: Aug 20, 1940 Today's Date: 09/03/2018   History of Present Illness  Pt is a 78 y.o. male admitted from [facility] on 08/30/18 due to SVT. Of note, admission to CIR from [facility] on 08/28/18 s/p craniotomy with tumor resection (07/2018). Other PMH includes CAD, HTN, OSA on CPAP, DM.    Clinical Impression  Pt presents with an overall decrease in functional mobility secondary to above. Pt admitted from CIR; prior to initial admission, indep with ambulation and ADLs. Today, pt requiring min-maxA to maintain balance with upright activity; also limited by difficulty problem solving, poor attention, and apparent visual deficits. Family present and supportive. Pt would benefit from continued acute PT services to maximize functional mobility and independence prior to d/c with continued CIR-level therapies.     Follow Up Recommendations CIR;Supervision for mobility/OOB    Equipment Recommendations  (TBD)    Recommendations for Other Services Rehab consult     Precautions / Restrictions Precautions Precautions: Fall Precaution Comments: impaired vision Restrictions Weight Bearing Restrictions: No      Mobility  Bed Mobility Overal bed mobility: Needs Assistance Bed Mobility: Supine to Sit     Supine to sit: Mod assist;HOB elevated     General bed mobility comments: Pt with difficulty initiating movement to EOB despite step-by-step cues; modA to assist BLEs to EOB towards L-side, modA to assist trunk elevation from elevated bed height. Once supine, pt with difficulty sequencing movement to reposition in bed  Transfers Overall transfer level: Needs assistance Equipment used: 1 person hand held assist Transfers: Sit to/from Stand Sit to Stand: Mod assist;+2 safety/equipment         General transfer comment: ModA to assist trunk elevation, pt requiring mod-maxA and HHA to maintain  balance once standing  Ambulation/Gait Ambulation/Gait assistance: Mod assist;+2 physical assistance Gait Distance (Feet): 8 Feet   Gait Pattern/deviations: Step-to pattern;Decreased weight shift to left;Decreased dorsiflexion - right;Shuffle;Leaning posteriorly Gait velocity: Decreased Gait velocity interpretation: <1.31 ft/sec, indicative of household ambulator General Gait Details: Amb 4' forwards and backwards with bilateral HHA and modA+2 to maintain standing balance, R foot lagging behind requiring increased effort to step; increased fatigue and bilateral knee instability noted when pt walking backwards towards EOB.  Stairs            Wheelchair Mobility    Modified Rankin (Stroke Patients Only)       Balance Overall balance assessment: Needs assistance   Sitting balance-Leahy Scale: Fair       Standing balance-Leahy Scale: Poor Standing balance comment: Reliant on minA to maintain balance when standing without UE support; unable to accept challenge/minimal perturbations without losing balance                             Pertinent Vitals/Pain Pain Assessment: Faces Faces Pain Scale: Hurts little more Pain Location: Back with movement Pain Descriptors / Indicators: Grimacing;Discomfort Pain Intervention(s): Monitored during session;Repositioned    Home Living Family/patient expects to be discharged to:: Private residence Living Arrangements: Spouse/significant other Available Help at Discharge: Family;Available 24 hours/day Type of Home: House Home Access: Stairs to enter Entrance Stairs-Rails: Right;Left(cannot reach both) Entrance Stairs-Number of Steps: 3 Home Layout: Two level;Able to live on main level with bedroom/bathroom   Additional Comments: can set up bedroom downstairs    Prior Function Level of Independence: Independent         Comments: Indep with shufflign  gait. Retired Airline pilot. Hobbies include woodworking, welding,  watching Jeopardy/[employer]     Journalist, newspaper        Extremity/Trunk Assessment   Upper Extremity Assessment Upper Extremity Assessment: Generalized weakness    Lower Extremity Assessment Lower Extremity Assessment: Generalized weakness       Communication      Cognition Arousal/Alertness: Lethargic(becoming more alert with upright activity) Behavior During Therapy: Flat affect Overall Cognitive Status: Impaired/Different from baseline Area of Impairment: Orientation;Attention;Memory;Following commands;Safety/judgement;Awareness;Problem solving                 Orientation Level: Disoriented to;Place Current Attention Level: Sustained Memory: Decreased short-term memory Following Commands: Follows one step commands consistently;Follows multi-step commands inconsistently Safety/Judgement: Decreased awareness of deficits Awareness: Intellectual Problem Solving: Slow processing;Decreased initiation;Difficulty sequencing;Requires verbal cues        General Comments General comments (skin integrity, edema, etc.): Wife and daughter present. Apparent visual deficits (see OT notes for specifics); pt unable to find hand in front of him to shake; pt reports "I can see almost everything, but it's blurry"    Exercises     Assessment/Plan    PT Assessment Patient needs continued PT services  PT Problem List Decreased strength;Decreased activity tolerance;Decreased balance;Decreased mobility;Decreased cognition;Decreased knowledge of use of DME;Decreased safety awareness;Cardiopulmonary status limiting activity       PT Treatment Interventions DME instruction;Gait training;Functional mobility training;Therapeutic activities;Therapeutic exercise;Balance training;Patient/family education    PT Goals (Current goals can be found in the Care Plan section)  Acute Rehab PT Goals Patient Stated Goal: Return to CIR for continued rehab PT Goal Formulation: With  patient/family Time For Goal Achievement: 09/17/18 Potential to Achieve Goals: Good    Frequency Min 3X/week   Barriers to discharge        Co-evaluation   Reason for Co-Treatment: Necessary to address cognition/behavior during functional activity;For patient/therapist safety;To address functional/ADL transfers PT goals addressed during session: Mobility/safety with mobility;Balance         AM-PAC PT "6 Clicks" Mobility  Outcome Measure Help needed turning from your back to your side while in a flat bed without using bedrails?: A Little Help needed moving from lying on your back to sitting on the side of a flat bed without using bedrails?: A Lot Help needed moving to and from a bed to a chair (including a wheelchair)?: A Lot Help needed standing up from a chair using your arms (e.g., wheelchair or bedside chair)?: A Lot Help needed to walk in hospital room?: A Lot Help needed climbing 3-5 steps with a railing? : Total 6 Click Score: 12    End of Session Equipment Utilized During Treatment: Gait belt Activity Tolerance: Patient tolerated treatment well;Patient limited by fatigue Patient left: in bed;with call bell/phone within reach;with bed alarm set;with family/visitor present Nurse Communication: Mobility status PT Visit Diagnosis: Other abnormalities of gait and mobility (R26.89);Muscle weakness (generalized) (M62.81)    Time: 5573-2202 PT Time Calculation (min) (ACUTE ONLY): 27 min   Charges:   PT Evaluation $PT Eval Moderate Complexity: [place], PT, DPT Acute Rehabilitation Services  Pager [phone] Office [place] 09/03/2018, 11:54 AM

## 2018-09-03 NOTE — Evaluation (Signed)
Occupational Therapy Evaluation Patient Details Name: Jason Farrell. MRN: [id number] DOB: [birth year] Today's Date: 09/03/2018    History of Present Illness Pt is a 78 y.o. male admitted from [facility] on 08/30/18 due to SVT. Of note, admission to CIR from [facility] on 08/28/18 s/p craniotomy with tumor resection (07/2018). Other PMH includes CAD, HTN, OSA on CPAP, DM.   Clinical Impression   Pt admitted with the above diagnoses and presents with below problem list. Pt will benefit from continued acute OT to address the below listed deficits and maximize independence with basic ADLs prior to d/c to venue below. At baseline pt is independent with ADLs. Pt is currently max +2 A for LB ADLs and functional mobility/transfers. Pt presents with visual deficits although difficulty to formally assess due to impaired cognition. Pt's spouse and daughter present throughout session.      Follow Up Recommendations  CIR    Equipment Recommendations  Other (comment)(defer to next venue)    Recommendations for Other Services Rehab consult     Precautions / Restrictions Precautions Precautions: Fall Precaution Comments: impaired vision Restrictions Weight Bearing Restrictions: No      Mobility Bed Mobility Overal bed mobility: Needs Assistance Bed Mobility: Supine to Sit;Sit to Supine     Supine to sit: Mod assist;HOB elevated Sit to supine: Mod assist   General bed mobility comments: Pt with difficulty initiating movement to EOB despite step-by-step cues; modA to assist BLEs to EOB towards L-side, modA to assist trunk elevation from elevated bed height. Once supine, pt with difficulty sequencing movement to reposition in bed  Transfers Overall transfer level: Needs assistance Equipment used: 2 person hand held assist;1 person hand held assist Transfers: Sit to/from Stand Sit to Stand: Mod assist;+2 physical assistance         General transfer comment: ModA to assist  trunk elevation, pt requiring mod-maxA and HHA to maintain balance once standing    Balance Overall balance assessment: Needs assistance   Sitting balance-Leahy Scale: Fair       Standing balance-Leahy Scale: Poor Standing balance comment: Reliant on minA to maintain balance when standing without UE support; unable to accept challenge/minimal perturbations without losing balance                           ADL either performed or assessed with clinical judgement   ADL Overall ADL's : Needs assistance/impaired Eating/Feeding: Moderate assistance;Sitting   Grooming: Sitting;Maximal assistance   Upper Body Bathing: Maximal assistance;Sitting   Lower Body Bathing: Maximal assistance;+2 for physical assistance;Sit to/from stand   Upper Body Dressing : Maximal assistance;Sitting   Lower Body Dressing: Maximal assistance;+2 for physical assistance;Sit to/from stand   Toilet Transfer: Maximal assistance;BSC;Stand-pivot;+2 for physical assistance   Toileting- Clothing Manipulation and Hygiene: Maximal assistance;+2 for physical assistance;Sit to/from stand   Tub/ Shower Transfer: Maximal assistance;Stand-pivot;3 in 1;+2 for physical assistance   Functional mobility during ADLs: Maximal assistance;+2 for physical assistance(+2 HHA) General ADL Comments: Pt completed bed mobility, sat EOB a minute or two then completed walking forwards and back a few paces, and side stepped along EOB. Cognition and vision impacting ADLs.     Vision Baseline Vision/History: Wears glasses Wears Glasses: At all times Patient Visual Report: Blurring of vision Vision Assessment?: Yes Eye Alignment: Impaired (comment) Ocular Range of Motion: Impaired-to be further tested in functional context(unable to follow commands for formal vision assessment) Alignment/Gaze Preference: Chin down Tracking/Visual Pursuits: Right eye does not track  laterally;Left eye does not track laterally;Requires cues,  head turns, or add eye shifts to track;Impaired - to be further tested in functional context Visual Fields: Impaired-to be further tested in functional context Depth Perception: Undershoots Additional Comments: Peripheral vision loss- difficulty to formally assess 2/2 cognitive deficits and difficulty following commands     Perception Perception Perception Tested?: Yes Perception Deficits: Object recognition;Figure ground;Spatial orientation Comments: Sitting EOB incorrectly pointing to foot of bed (towards his right side) when asked to point to his pillow.    Praxis      Pertinent Vitals/Pain Pain Assessment: Faces Faces Pain Scale: Hurts little more Pain Location: Back with movement Pain Descriptors / Indicators: Grimacing;Discomfort Pain Intervention(s): Monitored during session;Repositioned     Hand Dominance     Extremity/Trunk Assessment Upper Extremity Assessment Upper Extremity Assessment: Generalized weakness   Lower Extremity Assessment Lower Extremity Assessment: Defer to PT evaluation;Generalized weakness       Communication     Cognition Arousal/Alertness: Lethargic( becoming more alert with upright activity) Behavior During Therapy: Flat affect Overall Cognitive Status: Impaired/Different from baseline Area of Impairment: Orientation;Attention;Memory;Following commands;Safety/judgement;Awareness;Problem solving                 Orientation Level: Disoriented to;Place Current Attention Level: Sustained Memory: Decreased short-term memory Following Commands: Follows one step commands consistently;Follows multi-step commands inconsistently Safety/Judgement: Decreased awareness of deficits Awareness: Intellectual Problem Solving: Slow processing;Decreased initiation;Difficulty sequencing;Requires verbal cues     General Comments  Wife and daughter present. Apparent visual deficits (see OT notes for specifics); pt unable to find hand in front of him to  shake; pt reports "I can see almost everything, but it's blurry"    Exercises     Shoulder Instructions      Home Living Family/patient expects to be discharged to:: Private residence Living Arrangements: Spouse/significant other Available Help at Discharge: Family;Available 24 hours/day Type of Home: House Home Access: Stairs to enter [employer] of Steps: 3 Entrance Stairs-Rails: Right;Left(cannot reach both) Home Layout: Two level;Able to live on main level with bedroom/bathroom Alternate Level Stairs-Number of Steps: 3 steps, landing and then 12 steps Alternate Level Stairs-Rails: Left Bathroom Shower/Tub: Occupational psychologist: Standard Bathroom Accessibility: Yes How Accessible: Accessible via walker     Additional Comments: can set up bedroom downstairs  Lives With: Spouse    Prior Functioning/Environment Level of Independence: Independent        Comments: Indep with shuffling gait. Retired Airline pilot. Hobbies include woodworking, welding, watching Je[employer]        OT Problem List: Decreased strength;Decreased activity tolerance;Impaired balance (sitting and/or standing);Decreased cognition;Decreased knowledge of use of DME or AE;Decreased knowledge of precautions;Decreased safety awareness;Pain      OT Treatment/Interventions: Self-care/ADL training;Therapeutic exercise;Neuromuscular education;DME and/or AE instruction;Therapeutic activities;Cognitive remediation/compensation;Visual/perceptual remediation/compensation;Patient/family education;Balance training    OT Goals(Current goals can be found in the care plan section) Acute Rehab OT Goals Patient Stated Goal: Return to CIR for continued rehab OT Goal Formulation: With patient/family Time For Goal Achievement: 09/17/18 Potential to Achieve Goals: Good ADL Goals Pt Will Perform Grooming: with modified independence;sitting Pt Will Perform Upper Body Bathing: with  modified independence;sitting Pt Will Perform Lower Body Bathing: with min assist;sit to/from stand Pt Will Perform Upper Body Dressing: with set-up;sitting Pt Will Perform Lower Body Dressing: with min assist;sit to/from stand Pt Will Transfer to Toilet: ambulating;with min assist Pt Will Perform Toileting - Clothing Manipulation and hygiene: with min assist;sit to/from stand  OT Frequency: Min 3X/week  Barriers to D/C:            Co-evaluation PT/OT/SLP Co-Evaluation/Treatment: Yes Reason for Co-Treatment: Necessary to address cognition/behavior during functional activity;For patient/therapist safety;To address functional/ADL transfers PT goals addressed during session: Mobility/safety with mobility;Balance OT goals addressed during session: ADL's and self-care;Strengthening/ROM      AM-PAC OT "6 Clicks" Daily Activity     Outcome Measure Help from another person eating meals?: A Little Help from another person taking care of personal grooming?: A Lot Help from another person toileting, which includes using toliet, bedpan, or urinal?: A Lot Help from another person bathing (including washing, rinsing, drying)?: A Lot Help from another person to put on and taking off regular upper body clothing?: A Lot Help from another person to put on and taking off regular lower body clothing?: A Lot 6 Click Score: 13   End of Session    Activity Tolerance: Patient limited by fatigue;Patient tolerated treatment well Patient left: in bed;with call bell/phone within reach;with bed alarm set;with family/visitor present  OT Visit Diagnosis: Unsteadiness on feet (R26.81);Other abnormalities of gait and mobility (R26.89);Muscle weakness (generalized) (M62.81);Low vision, both eyes (H54.2);Other symptoms and signs involving cognitive function;Pain                Time: 3244-0102 OT Time Calculation (min): 27 min Charges:  OT General Charges $OT Visit: 1 Visit OT Evaluation $OT Eval Moderate  Complexity: [place], OT Acute Rehabilitation Services Pager: [phone] Office: [phone]   Hortencia Pilar 09/03/2018, 1:02 PM

## 2018-09-03 NOTE — Evaluation (Signed)
Speech Language Pathology Evaluation Patient Details Name: Jason Farrell. MRN: [id number] DOB: [birth year] Today's Date: 09/03/2018 Time: 5784-6962 SLP Time Calculation (min) (ACUTE ONLY): 20 min  Problem List:  Patient Active Problem List   Diagnosis Date Noted  . Pressure injury of skin 09/02/2018  . PRES (posterior reversible encephalopathy syndrome) 08/30/2018  . Acute blood loss anemia   . Hypoalbuminemia due to protein-calorie malnutrition ([place])   . Hypokalemia   . Leukocytosis   . Encephalopathy   . SVT (supraventricular tachycardia) ([place])   . 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 Debility   . Meningioma ([place])   . 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 Senile purpura ([place]) 02/21/2018  . Type 2 diabetes mellitus with microalbuminuria, without long-term current use of insulin ([place]) 02/21/2018  . Controlled type 2 diabetes mellitus without complication, without long-term current use of insulin ([place]) 11/11/2017  . 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 Obstructive sleep apnea on CPAP 12/09/2015  . Acid reflux 05/06/2015  . Arthritis, degenerative 05/06/2015  . CAD in native artery 01/25/2015  . Diabetes mellitus type 2 in obese ([place]) 08/16/2014  . Hyperlipidemia 08/16/2014  . Essential hypertension 08/16/2014  . Morbid obesity ([place]) 08/16/2014  . Stopped smoking with greater than 30 pack year history 08/16/2014  . COLD (chronic obstructive lung disease) ([place]) 08/16/2014   Past Medical History:  Past Medical History:  Diagnosis Date  . Coronary artery disease   . Diabetes mellitus without complication ([place])   . Dyslipidemia   . GERD (gastroesophageal reflux disease)   . Hyperlipidemia   . Hypertension   . MI (myocardial infarction) ([place])   . Osteoarthritis    Past Surgical History:  Past Surgical History:  Procedure Laterality Date  . CARDIAC CATHETERIZATION  2006   stent placement  . COLONOSCOPY    . CORONARY ANGIOPLASTY WITH STENT  PLACEMENT  2006   stent x 2   . CORONARY ANGIOPLASTY WITH STENT PLACEMENT  1996   stent x 3   . FOOT SURGERY Right   . HALLUX VALGUS CHEILECTOMY Right 02/25/2015   Procedure: HALLUX VALGUS CHEILECTOMY;  Surgeon: [name], DPM;  Location: [facility] ORS;  Service: Podiatry;  Laterality: Right;   HPI:  Pt is a 78 yo male, admitted from CIR due to SVT. PMH significant for CAD, HTN, HLD, OSA on CPAP, DM, h/o brain tumor s/p craniotomy with tumor resection ([place] 2020). Previous BSE on CIR recommended Dys 3 diet and thin liquids.   Assessment / Plan / Recommendation Clinical Impression  Pt is more lethargic and confused today than he has been on previous days per family and RN report, and compared to this SLP's visit on previous date. He needs constant stimulation to focus attention and maintain alertness. When alert he will follow one-step commands well, but not two-step ones. He answered simple biographical questions correctly but had Min errors with more complex questions when given Max cues for arousal. Cues were provided to increase orientation to location (city and hospital), and although he knew his name he did not know his birthdate without assistance. PTA pt is cognitively very independent per family report. Recommend SLP f/u to maximize functional cognition, communication, and safety.    SLP Assessment  SLP Recommendation/Assessment: Patient needs continued Speech Lanaguage Pathology Services SLP Visit Diagnosis: Cognitive communication deficit (R41.841)    Follow Up Recommendations  Inpatient Rehab    Frequency and Duration min 2x/week  2 weeks      SLP Evaluation Cognition  Overall Cognitive Status: Impaired/Different from baseline Arousal/Alertness: Lethargic Orientation Level: Oriented to person;Oriented to situation;Disoriented to place Attention: Focused Focused Attention: Impaired Focused Attention Impairment: Verbal basic;Functional basic Awareness:  Impaired Awareness Impairment: Intellectual impairment Problem Solving: Impaired Problem Solving Impairment: Verbal basic Safety/Judgment: Impaired       Comprehension  Auditory Comprehension Overall Auditory Comprehension: Impaired Yes/No Questions: Impaired Basic Biographical Questions: 76-100% accurate Complex Questions: 50-74% accurate Commands: Impaired One Step Basic Commands: 75-100% accurate Two Step Basic Commands: 0-24% accurate Conversation: Simple Interfering Components: Attention;Other (comment)(alertness)    Expression Expression Primary Mode of Expression: Verbal Verbal Expression Overall Verbal Expression: (needs more assessment when more alert)   Oral / Motor  Motor Speech Overall Motor Speech: (needs more assessment when more alert)   GO                    Venita Sheffield Jauna Raczynski 09/03/2018, 10:41 AM  Germain Osgood Jacques Willingham, M.A. [place] Acute Environmental education officer [phone] Office [phone]

## 2018-09-03 NOTE — Consult Note (Signed)
   [facility] CM Inpatient Consult   09/03/2018  Brylin Stanislawski. [birth year] [id number]   Patient screened for high risk score and 2 hospitalizations in the past 6 months to check if potential [place] Management services are needed.  Patient with Medicare.  Went by to speak with patient but he was starting to work with therapy.  [facility] will follow for assessment and disposition needs.  Please place a [facility] Care Management consult or for questions contact:   Natividad Brood, RN BSN [facility] Liaison  [phone] business mobile phone Toll free office [phone]

## 2018-09-03 NOTE — Progress Notes (Signed)
Inpatient Diabetes Program Recommendations  AACE/ADA: New Consensus Statement on Inpatient Glycemic Control (2015)  Target Ranges:  Prepandial:   less than 140 mg/dL      Peak postprandial:   less than 180 mg/dL (1-2 hours)      Critically ill patients:  140 - 180 mg/dL   Results for JAC, ROMULUS (MRN [id number]) as of 09/03/2018 12:24  Ref. Range 09/02/2018 07:26 09/02/2018 12:30 09/02/2018 16:20 09/02/2018 21:25 09/03/2018 07:15 09/03/2018 12:01  Glucose-Capillary Latest Ref Range: 70 - 99 mg/dL 161 (H) 160 (H) 180 (H) 216 (H) 195 (H) 243 (H)    Review of Glycemic Control  Diabetes history: DM 2 Outpatient Diabetes medications: Glipizide 5 mg BID, Metformin 1500 mg Daily, Januvia 100 mg Daily Current orders for Inpatient glycemic control: Novolog 0-15 units tid, Novolog 0-5 units qhs  Inpatient Diabetes Program Recommendations:    Glucose in the 200 range. On multiple oral DM medications outpatient. Consider increasing Novolog correction scale to 0-20 units tid.  Thanks,  Tama Headings RN, MSN, BC-ADM Inpatient Diabetes Coordinator Team Pager [phone] (8a-5p)

## 2018-09-03 NOTE — Progress Notes (Signed)
Inpatient Rehabilitation Admissions Coordinator  I contacted pt's wife by phone for she has recently left for home. She prefers patient to be readmitted to CIR when pt is medically ready. I will discuss with Dr. Naaman Plummer and follow up tomorrow with the possibility of readmission to CIR .  Danne Baxter, RN, MSN Rehab Admissions Coordinator [phone] 09/03/2018 1:55 PM

## 2018-09-03 NOTE — Progress Notes (Signed)
PROGRESS NOTE    Jason Farrell.  KMM:[id number] DOB: 03/06/1941 DOA: 08/30/2018 PCP: Steele Sizer, MD   Brief Narrative:  78 year old with past medical history relevant for OSA on CPAP, hypertension, hyperlipidemia, type 2 diabetes, coronary artery disease status post PCI COPD who was initially admitted for meningioma resection status post intraventricular and subdural hemorrhage discharged to [facility] inpatient rehab readmitted on 08/30/2018 with recurrent SVT and altered mental status and found to have perioperative stroke.   Assessment & Plan:   Principal Problem:   SVT (supraventricular tachycardia) (HCC) Active Problems:   Essential hypertension   COLD (chronic obstructive lung disease) (HCC)   Obstructive sleep apnea on CPAP   Controlled type 2 diabetes mellitus without complication, without long-term current use of insulin (HCC)   Meningioma (HCC)   Nontraumatic intraventricular intracerebral hemorrhage (HCC)   PRES (posterior reversible encephalopathy syndrome)   Pressure injury of skin   #) Altered mental status due to perioperative stroke: Currently the most likely etiology is felt to be this perioperative stroke.  Reportedly the patient's mental status is gradually improving.  He currently does not have any other metabolic causes that could be noted including no infectious symptoms.  He is on dramatic amounts of medications for pain either. - Frequent reorientation -Neurology following, appreciate recommendations - EEG showed no evidence of epileptiform activity on 09/01/2018  #) Meningioma status post resection complicated by hemorrhage: This appears to be stable on recent imaging. -Plan to discharge back to [facility] inpatient rehab  #) SVT: Patient is required 2 doses of adenosine.  He additionally was loaded up with amiodarone. -Cardiology following, appreciate recommendations -Continue amiodarone 200 mg twice daily -Continue metoprolol succinate 25 mg twice  daily  #) Hypertension/hyperlipidemia: -Continue beta-blocker -Continue lisinopril 20 mg daily -Continue HCTZ 12.5 mg daily - Amlodipine 2.5 mg daily -Continue atorvastatin 40 mg daily  #) Coronary artery disease status post PCI: -Continue aspirin 81 mg daily -Per above continue beta-blocker, ACE inhibitor, statin  #) Type 2 diabetes: -Sliding scale insulin, AC at bedtime  Fluids: Tolerating p.o. Electrolytes: Monitor and supplement Nutrition: Heart healthy carb restricted diet   Prophylaxis: SCDs  Disposition: Pending improvement of mental status   Full code  Consultants:   Cardiology  Neurology  Procedures:  Echo 08/30/2018: 1. The left ventricle has a visually estimated ejection fraction of of 55%. The cavity size was normal. There is moderately increased left ventricular wall thickness. Left ventricular diastolic Doppler parameters are consistent with impaired relaxation  No evidence of left ventricular regional wall motion abnormalities.  2. The right ventricle has normal systolic function. The cavity was normal. There is no increase in right ventricular wall thickness.  3. Trivial pericardial effusion.  4. The mitral valve is normal in structure. No evidence of mitral valve stenosis. Mild regurgitation.  5. The tricuspid valve is normal in structure.  6. The aortic valve is tricuspid Aortic valve regurgitation is trivial by color flow Doppler. no stenosis of the aortic valve.  7. The aortic root and ascending aorta are normal in size and structure.  8. The inferior vena cava was normal in size with <50% respiratory variability.  9. No evidence of left ventricular regional wall motion abnormalities. 10. Right atrial pressure is estimated at 8 mmHg. 11. No complete TR doppler jet so unable to estimate PA systolic pressure.   EEG 09/01/2018:IMPRESSION: This is a normal awake and drowsy electroencephalogram.  No epileptiform activity is noted.       Antimicrobials:   None  Subjective: This morning the patient reports that he does not have any complaints.  He reports that he is at his home but then self corrects and realizes he is in the hospital.  He denies any chest pain, abdominal pain, nausea, vomiting, diarrhea.  Objective: Vitals:   09/02/18 1232 09/02/18 1600 09/02/18 1928 09/03/18 0510  BP: (!) 150/79 139/68 133/84 115/63  Pulse: 90 84 89 91  Resp: 20 19 18 18   Temp: 98.6 F (37 C) 97.8 F (36.6 C) 98.4 F (36.9 C) (!) 97 F (36.1 C)  TempSrc: Oral Oral Oral Oral  SpO2: 100% 99% 98% 98%  Weight:    86 kg  Height:        Intake/Output Summary (Last 24 hours) at 09/03/2018 5053 Last data filed at 09/03/2018 0500 Gross per 24 hour  Intake 460 ml  Output 1075 ml  Net -615 ml   Filed Weights   09/01/18 0449 09/02/18 0435 09/03/18 0510  Weight: 85.6 kg 86.2 kg 86 kg    Examination:  General exam: No acute distress Respiratory system: No increased work of breathing, diminished lung sounds at bases, no wheezes, crackles, rhonchi Cardiovascular system: Regular rate and rhythm, no murmurs Gastrointestinal system: Soft, nondistended, no rebound or guarding, plus bowel sounds Central nervous system: Alert but not oriented, occasionally follows commands, no notable weakness that is focal Extremities: 1+ lower extremity edema Skin: No rashes over visible skin Psychiatry: Unable to assess due to medical condition    Data Reviewed: I have personally reviewed following labs and imaging studies  CBC: Recent Labs  Lab 08/29/18 0659 08/30/18 0935 08/31/18 0546  WBC 11.0* 10.5 9.3  NEUTROABS 9.0*  --   --   HGB 12.0* 12.7* 11.3*  HCT 36.1* 39.6 36.3*  MCV 88.9 89.8 89.4  PLT 298 343 976   Basic Metabolic Panel: Recent Labs  Lab 08/30/18 0935 08/31/18 0546 09/01/18 0745 09/02/18 0450 09/03/18 0329  NA 137 140 140 138 140  K 4.6 3.5 3.2* 3.5 4.1  CL 106 107 106 103 101  CO2 21* 25 26 28 30    GLUCOSE 334* 165* 167* 172* 188*  BUN 16 12 8  5* <5*  CREATININE 0.95 0.77 0.71 0.75 0.80  CALCIUM 9.1 8.4* 8.2* 8.4* 8.7*  MG  --   --  1.7 1.9 1.7   GFR: Estimated Creatinine Clearance: 82 mL/min (by C-G formula based on SCr of 0.8 mg/dL). Liver Function Tests: Recent Labs  Lab 08/29/18 0659 09/01/18 0745 09/02/18 0450 09/03/18 0329  AST 21 17 15  14*  ALT 34 34 31 30  ALKPHOS 68 75 76 83  BILITOT 0.7 0.6 0.5 0.8  PROT 5.4* 5.3* 5.1* 5.9*  ALBUMIN 2.4* 2.2* 2.1* 2.3*   No results for input(s): LIPASE, AMYLASE in the last 168 hours. No results for input(s): AMMONIA in the last 168 hours. Coagulation Profile: No results for input(s): INR, PROTIME in the last 168 hours. Cardiac Enzymes: Recent Labs  Lab 08/30/18 0935  CKTOTAL 29*  CKMB 1.4  TROPONINI <0.03   BNP (last 3 results) No results for input(s): PROBNP in the last 8760 hours. HbA1C: No results for input(s): HGBA1C in the last 72 hours. CBG: Recent Labs  Lab 09/02/18 0726 09/02/18 1230 09/02/18 1620 09/02/18 2125 09/03/18 0715  GLUCAP 161* 160* 180* 216* 195*   Lipid Profile: No results for input(s): CHOL, HDL, LDLCALC, TRIG, CHOLHDL, LDLDIRECT in the last 72 hours. Thyroid Function Tests: No results for input(s): TSH, T4TOTAL, FREET4, T3FREE,  THYROIDAB in the last 72 hours. Anemia Panel: No results for input(s): VITAMINB12, FOLATE, FERRITIN, TIBC, IRON, RETICCTPCT in the last 72 hours. Sepsis Labs: No results for input(s): PROCALCITON, LATICACIDVEN in the last 168 hours.  No results found for this or any previous visit (from the past 240 hour(s)).       Radiology Studies: Mr Jeri Cos NW Contrast  Result Date: 09/02/2018 CLINICAL DATA:  78 y/o  M; status post meningioma resection. EXAM: MRI HEAD WITHOUT AND WITH CONTRAST TECHNIQUE: Multiplanar, multiecho pulse sequences of the brain and surrounding structures were obtained without and with intravenous contrast. CONTRAST:  8.5 cc Gadavist.  COMPARISON:  08/30/2018 CT head.  07/21/2018 MRI head. FINDINGS: Brain: Resection cavity superior to the cerebellum and within the splenium of corpus callosum contains T1 hyperintense blood products with susceptibility blooming and smooth thin linear enhancement at the margins compatible postoperative changes. The inferomedial cavity margin there is a nodular focus of enhancement measuring 14 x 11 x 10 mm with similar signal characteristics to the meningioma prior MRI compatible with residual tumor (series 11, image 22 and series 14, image 13). Residual tumor is associated with the vein of Galen as well as the origins of internal cerebral veins. Within the splenium of corpus callosum and right forceps of corpus callosum there is reduced diffusion compatible with ischemia. Stable ventricle size. Trace volume of hemorrhage is present within the occipital horn of the right lateral ventricle. There is a thin extradural postop fluid collection subjacent to parieto-occipital craniotomy without associated mass effect. There is an additional thin 4 mm subdural collection over the left parietal lobe. Trace volume of hemorrhage along the tentorium/posterior falx. No mass effect or herniation. Small chronic lacunar infarct within the left basal ganglia with hemosiderin staining. Stable punctate nonspecific T2 FLAIR hyperintensities in subcortical and periventricular white matter are compatible with mild chronic microvascular ischemic changes. Stable mild volume loss of the brain. Vascular: Normal flow voids. Skull and upper cervical spine: Normal marrow signal. Sinuses/Orbits: Negative. Other: None. IMPRESSION: 1. Postoperative changes related to parieto-occipital craniotomy and resection of meningioma. Small volume of associated intracranial hemorrhage without mass effect. 2. 14 mm focus of residual nodular enhancement at the inferomedial cavity margin compatible with residual tumor. 3. Acute ischemia with splenium in right  forceps of splenium of corpus callosum. 4. Stable lateral and third ventricle size. No new focus of mass effect. No herniation. Electronically Signed   By: Kristine Garbe M.D.   On: 09/02/2018 13:42        Scheduled Meds: .  stroke: mapping our early stages of recovery book   Does not apply Once  . adenosine (ADENOCARD) IV  12 mg Intravenous Once  . amiodarone  200 mg Oral BID  . amLODipine  2.5 mg Oral Daily  . aspirin  81 mg Oral Daily  . atorvastatin  40 mg Oral Daily  . docusate sodium  100 mg Oral BID  . feeding supplement (GLUCERNA SHAKE)  237 mL Oral BID BM  . lisinopril  20 mg Oral Daily   And  . hydrochlorothiazide  12.5 mg Oral Daily  . insulin aspart  0-15 Units Subcutaneous TID WC  . insulin aspart  0-5 Units Subcutaneous QHS  . metoprolol succinate  25 mg Oral BID WC  . pantoprazole  40 mg Oral Daily  . QUEtiapine  12.5 mg Oral Daily  . QUEtiapine  50 mg Oral QHS  . sodium chloride flush  3 mL Intravenous Q12H   Continuous  Infusions: . sodium chloride 20 mL/hr at 09/02/18 0356     LOS: 4 days    Time spent: [place], MD [facility]  If 7PM-7AM, please contact night-coverage [email or web address] Password [email or web address] 09/03/2018, 9:06 AM

## 2018-09-03 NOTE — Progress Notes (Signed)
Nutrition Follow-up  DOCUMENTATION CODES:   Obesity unspecified  INTERVENTION:   -MVI with minerals daily -Continue Glucerna Shake po BID, each supplement provides 220 kcal and 10 grams of protein  NUTRITION DIAGNOSIS:   Increased nutrient needs related to wound healing as evidenced by estimated needs.  Ongoing  GOAL:   Patient will meet greater than or equal to 90% of their needs  Progressing  MONITOR:   PO intake, Supplement acceptance, Labs, Weight trends  REASON FOR ASSESSMENT:   Malnutrition Screening Tool    ASSESSMENT:   78 yo male, admitted with supraventricular tachycardia. PMH significant for CAD, HTN, HLD, OSA on CPAP, DM, h/o brain tumor s/p craniotomy with tumor resection. Admitted to CIR on 2/13 with functional deficits.   Reviewed I/O's: -1 L x 24 hours and +857 ml since admission  Case discussed with RN, who reports that pt remains with good appetite, however, is more confused today in comparison today. Pt was able to feed himself meals yesterday, but now requires assistance. Neurology consult pending.   Spoke with pt, who was pleasantly confused at time of visit ("I'm about to go back to the office to sign important documents"). He reports good appetite and improved swallowing; he is eager to eat dinner. Encouraged continued good appetite for healing.   Per CIR admissions notes, possible plan to transfer back to CIR.   Labs reviewed: CBGS: [phone] (inpatient orders for glycemic control are 0-15 units insulin aspart TID with meals and 0-5 units insulin aspart q HS).   NUTRITION - FOCUSED PHYSICAL EXAM:    Most Recent Value  Orbital Region  No depletion  Upper Arm Region  No depletion  Thoracic and Lumbar Region  No depletion  Buccal Region  No depletion  Temple Region  No depletion  Clavicle Bone Region  No depletion  Clavicle and Acromion Bone Region  No depletion  Scapular Bone Region  No depletion  Dorsal Hand  No depletion  Patellar Region   No depletion  Anterior Thigh Region  No depletion  Posterior Calf Region  No depletion  Edema (RD Assessment)  Mild  Hair  Reviewed  Eyes  Reviewed  Mouth  Reviewed  Skin  Reviewed  Nails  Reviewed       Diet Order:   Diet Order            Diet heart healthy/carb modified Room service appropriate? Yes; Fluid consistency: Thin  Diet effective now              EDUCATION NEEDS:   Not appropriate for education at this time  Skin:  Skin Assessment: Skin Integrity Issues: Skin Integrity Issues:: Stage II Stage II: sacrum Incisions: posterior head Other: MASD groin; rash on back  Last BM:  09/01/18  Height:   Ht Readings from Last 1 Encounters:  08/30/18 5\' 6"  (1.676 m)    Weight:   Wt Readings from Last 1 Encounters:  09/03/18 86 kg    Ideal Body Weight:  64.5 kg  BMI:  Body mass index is 30.6 kg/m.  Estimated Nutritional Needs:   Kcal:  1950-2150  Protein:  100-115 grams  Fluid:  > 1.9 L    Allee Busk A. Jimmye Norman, RD, LDN, CDE Pager: [phone] After hours Pager: [phone]

## 2018-09-04 ENCOUNTER — Inpatient Hospital Stay (HOSPITAL_COMMUNITY)
Admission: RE | Admit: 2018-09-04 | Discharge: 2018-09-18 | DRG: 092 | Disposition: A | Discharge status: Home / Self Care | Payer: Medicare Other | Source: Intra-hospital | Attending: Physical Medicine & Rehabilitation | Admitting: Physical Medicine & Rehabilitation

## 2018-09-04 ENCOUNTER — Encounter (HOSPITAL_COMMUNITY): Payer: Self-pay

## 2018-09-04 ENCOUNTER — Inpatient Hospital Stay: Payer: Self-pay

## 2018-09-04 ENCOUNTER — Other Ambulatory Visit: Payer: Self-pay

## 2018-09-04 DIAGNOSIS — I471 Supraventricular tachycardia: Secondary | ICD-10-CM | POA: Diagnosis present

## 2018-09-04 DIAGNOSIS — Z955 Presence of coronary angioplasty implant and graft: Secondary | ICD-10-CM | POA: Diagnosis not present

## 2018-09-04 DIAGNOSIS — H47619 Cortical blindness, unspecified side of brain: Secondary | ICD-10-CM

## 2018-09-04 DIAGNOSIS — K219 Gastro-esophageal reflux disease without esophagitis: Secondary | ICD-10-CM | POA: Diagnosis present

## 2018-09-04 DIAGNOSIS — R0902 Hypoxemia: Secondary | ICD-10-CM | POA: Diagnosis present

## 2018-09-04 DIAGNOSIS — E876 Hypokalemia: Secondary | ICD-10-CM | POA: Diagnosis present

## 2018-09-04 DIAGNOSIS — R7309 Other abnormal glucose: Secondary | ICD-10-CM | POA: Diagnosis not present

## 2018-09-04 DIAGNOSIS — I69319 Unspecified symptoms and signs involving cognitive functions following cerebral infarction: Secondary | ICD-10-CM

## 2018-09-04 DIAGNOSIS — E669 Obesity, unspecified: Secondary | ICD-10-CM

## 2018-09-04 DIAGNOSIS — I252 Old myocardial infarction: Secondary | ICD-10-CM | POA: Diagnosis not present

## 2018-09-04 DIAGNOSIS — W19XXXA Unspecified fall, initial encounter: Secondary | ICD-10-CM | POA: Diagnosis not present

## 2018-09-04 DIAGNOSIS — F09 Unspecified mental disorder due to known physiological condition: Secondary | ICD-10-CM | POA: Diagnosis not present

## 2018-09-04 DIAGNOSIS — R0989 Other specified symptoms and signs involving the circulatory and respiratory systems: Secondary | ICD-10-CM | POA: Diagnosis not present

## 2018-09-04 DIAGNOSIS — E1129 Type 2 diabetes mellitus with other diabetic kidney complication: Secondary | ICD-10-CM

## 2018-09-04 DIAGNOSIS — Z8679 Personal history of other diseases of the circulatory system: Secondary | ICD-10-CM

## 2018-09-04 DIAGNOSIS — Z86011 Personal history of benign neoplasm of the brain: Secondary | ICD-10-CM | POA: Diagnosis not present

## 2018-09-04 DIAGNOSIS — I10 Essential (primary) hypertension: Secondary | ICD-10-CM | POA: Diagnosis present

## 2018-09-04 DIAGNOSIS — Z7982 Long term (current) use of aspirin: Secondary | ICD-10-CM

## 2018-09-04 DIAGNOSIS — R9431 Abnormal electrocardiogram [ECG] [EKG]: Secondary | ICD-10-CM | POA: Diagnosis not present

## 2018-09-04 DIAGNOSIS — E785 Hyperlipidemia, unspecified: Secondary | ICD-10-CM | POA: Diagnosis present

## 2018-09-04 DIAGNOSIS — I6783 Posterior reversible encephalopathy syndrome: Secondary | ICD-10-CM

## 2018-09-04 DIAGNOSIS — E1165 Type 2 diabetes mellitus with hyperglycemia: Secondary | ICD-10-CM | POA: Diagnosis present

## 2018-09-04 DIAGNOSIS — I615 Nontraumatic intracerebral hemorrhage, intraventricular: Secondary | ICD-10-CM

## 2018-09-04 DIAGNOSIS — R2689 Other abnormalities of gait and mobility: Secondary | ICD-10-CM | POA: Diagnosis present

## 2018-09-04 DIAGNOSIS — D62 Acute posthemorrhagic anemia: Secondary | ICD-10-CM | POA: Diagnosis present

## 2018-09-04 DIAGNOSIS — D329 Benign neoplasm of meninges, unspecified: Secondary | ICD-10-CM | POA: Diagnosis not present

## 2018-09-04 DIAGNOSIS — G479 Sleep disorder, unspecified: Secondary | ICD-10-CM

## 2018-09-04 DIAGNOSIS — R410 Disorientation, unspecified: Secondary | ICD-10-CM | POA: Diagnosis not present

## 2018-09-04 DIAGNOSIS — E46 Unspecified protein-calorie malnutrition: Secondary | ICD-10-CM | POA: Diagnosis not present

## 2018-09-04 DIAGNOSIS — E1169 Type 2 diabetes mellitus with other specified complication: Secondary | ICD-10-CM

## 2018-09-04 DIAGNOSIS — E119 Type 2 diabetes mellitus without complications: Secondary | ICD-10-CM | POA: Diagnosis not present

## 2018-09-04 DIAGNOSIS — R809 Proteinuria, unspecified: Secondary | ICD-10-CM

## 2018-09-04 DIAGNOSIS — R0602 Shortness of breath: Secondary | ICD-10-CM

## 2018-09-04 DIAGNOSIS — G4733 Obstructive sleep apnea (adult) (pediatric): Secondary | ICD-10-CM | POA: Diagnosis present

## 2018-09-04 DIAGNOSIS — Z87891 Personal history of nicotine dependence: Secondary | ICD-10-CM | POA: Diagnosis not present

## 2018-09-04 DIAGNOSIS — Z7984 Long term (current) use of oral hypoglycemic drugs: Secondary | ICD-10-CM

## 2018-09-04 DIAGNOSIS — I2510 Atherosclerotic heart disease of native coronary artery without angina pectoris: Secondary | ICD-10-CM | POA: Diagnosis present

## 2018-09-04 DIAGNOSIS — Z79899 Other long term (current) drug therapy: Secondary | ICD-10-CM | POA: Diagnosis not present

## 2018-09-04 DIAGNOSIS — F05 Delirium due to known physiological condition: Secondary | ICD-10-CM

## 2018-09-04 LAB — BASIC METABOLIC PANEL
Anion gap: 14 (ref 5–15)
BUN: 12 mg/dL (ref 8–23)
CO2: 24 mmol/L (ref 22–32)
Calcium: 8.6 mg/dL — ABNORMAL LOW (ref 8.9–10.3)
Chloride: 100 mmol/L (ref 98–111)
Creatinine, Ser: 0.89 mg/dL (ref 0.61–1.24)
GFR calc Af Amer: >60 mL/min (ref >60)
GFR calc non Af Amer: >60 mL/min (ref >60)
Glucose, Bld: 186 mg/dL — ABNORMAL HIGH (ref 70–99)
Potassium: 3.2 mmol/L — ABNORMAL LOW (ref 3.5–5.1)
Sodium: 138 mmol/L (ref 135–145)

## 2018-09-04 LAB — CBC
HCT: 37.8 % — ABNORMAL LOW (ref 39.0–52.0)
Hemoglobin: 12.3 g/dL — ABNORMAL LOW (ref 13.0–17.0)
MCH: 29.2 pg (ref 26.0–34.0)
MCHC: 32.5 g/dL (ref 30.0–36.0)
MCV: 89.8 fL (ref 80.0–100.0)
Platelets: 308 10*3/uL (ref 150–400)
RBC: 4.21 MIL/uL — ABNORMAL LOW (ref 4.22–5.81)
RDW: 14.9 % (ref 11.5–15.5)
WBC: 7.3 10*3/uL (ref 4.0–10.5)
nRBC: 0 % (ref 0.0–0.2)

## 2018-09-04 LAB — GLUCOSE, CAPILLARY
GLUCOSE-CAPILLARY: 167 mg/dL — AB (ref 70–99)
Glucose-Capillary: 244 mg/dL — ABNORMAL HIGH (ref 70–99)
Glucose-Capillary: 229 mg/dL — ABNORMAL HIGH (ref 70–99)
Glucose-Capillary: 165 mg/dL — ABNORMAL HIGH (ref 70–99)

## 2018-09-04 LAB — MAGNESIUM: Magnesium: 1.7 mg/dL (ref 1.7–2.4)

## 2018-09-04 MED ORDER — PROCHLORPERAZINE MALEATE 5 MG PO TABS: 5.0000 mg | ORAL_TABLET | Freq: Four times a day (QID) | ORAL | Status: DC | PRN

## 2018-09-04 MED ORDER — ENOXAPARIN SODIUM 40 MG/0.4ML ~~LOC~~ SOLN
40.0000 mg | SUBCUTANEOUS | Status: DC
  Administered 2018-09-04 – 2018-09-16 (×13): 40 mg via SUBCUTANEOUS
  Filled 2018-09-04 (×14): qty 0.4

## 2018-09-04 MED ORDER — PROCHLORPERAZINE EDISYLATE 10 MG/2ML IJ SOLN: 5.0000 mg | Freq: Four times a day (QID) | INTRAMUSCULAR | Status: DC | PRN

## 2018-09-04 MED ORDER — PROCHLORPERAZINE 25 MG RE SUPP: 12.5000 mg | Freq: Four times a day (QID) | RECTAL | Status: DC | PRN

## 2018-09-04 MED ORDER — GLUCERNA SHAKE PO LIQD
237.0000 mL | Freq: Three times a day (TID) | ORAL | Status: DC
  Administered 2018-09-05 (×2): 237 mL via ORAL

## 2018-09-04 MED ORDER — METOPROLOL SUCCINATE ER 25 MG PO TB24
25.0000 mg | ORAL_TABLET | Freq: Two times a day (BID) | ORAL | Status: DC
  Administered 2018-09-04 – 2018-09-18 (×27): 25 mg via ORAL
  Filled 2018-09-04 (×28): qty 1

## 2018-09-04 MED ORDER — GUAIFENESIN-DM 100-10 MG/5ML PO SYRP: 5.0000 mL | ORAL_SOLUTION | Freq: Four times a day (QID) | ORAL | Status: DC | PRN

## 2018-09-04 MED ORDER — ACETAMINOPHEN 325 MG PO TABS
325.0000 mg | ORAL_TABLET | ORAL | Status: DC | PRN
  Administered 2018-09-05 – 2018-09-14 (×8): 650 mg via ORAL
  Filled 2018-09-04 (×10): qty 2

## 2018-09-04 MED ORDER — QUETIAPINE FUMARATE 50 MG PO TABS
50.0000 mg | ORAL_TABLET | Freq: Every day | ORAL | Status: DC
  Administered 2018-09-04: 50 mg via ORAL
  Filled 2018-09-04: qty 1

## 2018-09-04 MED ORDER — POTASSIUM CHLORIDE CRYS ER 20 MEQ PO TBCR
40.0000 meq | EXTENDED_RELEASE_TABLET | Freq: Once | ORAL | Status: AC
  Administered 2018-09-04: 40 meq via ORAL
  Filled 2018-09-04: qty 2

## 2018-09-04 MED ORDER — QUETIAPINE FUMARATE 25 MG PO TABS
12.5000 mg | ORAL_TABLET | Freq: Every day | ORAL | Status: DC
  Administered 2018-09-05: 12.5 mg via ORAL
  Filled 2018-09-04: qty 1

## 2018-09-04 MED ORDER — LORAZEPAM 2 MG/ML IJ SOLN: 1.0000 mg | Freq: Every evening | INTRAMUSCULAR | Status: DC | PRN

## 2018-09-04 MED ORDER — POLYETHYLENE GLYCOL 3350 17 G PO PACK
17.0000 g | PACK | Freq: Every day | ORAL | Status: DC | PRN
  Filled 2018-09-04: qty 1

## 2018-09-04 MED ORDER — POTASSIUM CHLORIDE CRYS ER 20 MEQ PO TBCR
20.0000 meq | EXTENDED_RELEASE_TABLET | Freq: Two times a day (BID) | ORAL | Status: DC
  Administered 2018-09-04 – 2018-09-05 (×2): 20 meq via ORAL
  Filled 2018-09-04 (×2): qty 1

## 2018-09-04 MED ORDER — LISINOPRIL 20 MG PO TABS
20.0000 mg | ORAL_TABLET | Freq: Every day | ORAL | Status: DC
  Administered 2018-09-05 – 2018-09-18 (×14): 20 mg via ORAL
  Filled 2018-09-04 (×14): qty 1

## 2018-09-04 MED ORDER — DOCUSATE SODIUM 100 MG PO CAPS
100.0000 mg | ORAL_CAPSULE | Freq: Two times a day (BID) | ORAL | Status: DC
  Administered 2018-09-04 – 2018-09-18 (×28): 100 mg via ORAL
  Filled 2018-09-04 (×28): qty 1

## 2018-09-04 MED ORDER — METOPROLOL SUCCINATE ER 25 MG PO TB24: 25.0000 mg | ORAL_TABLET | Freq: Two times a day (BID) | ORAL | 0 refills | Status: DC

## 2018-09-04 MED ORDER — INSULIN ASPART 100 UNIT/ML ~~LOC~~ SOLN
0.0000 [IU] | Freq: Three times a day (TID) | SUBCUTANEOUS | Status: DC
  Administered 2018-09-04: 5 [IU] via SUBCUTANEOUS
  Administered 2018-09-05: 8 [IU] via SUBCUTANEOUS
  Administered 2018-09-05 (×2): 3 [IU] via SUBCUTANEOUS
  Administered 2018-09-06: 2 [IU] via SUBCUTANEOUS
  Administered 2018-09-06 (×2): 3 [IU] via SUBCUTANEOUS
  Administered 2018-09-07: 2 [IU] via SUBCUTANEOUS
  Administered 2018-09-07: 3 [IU] via SUBCUTANEOUS
  Administered 2018-09-07: 2 [IU] via SUBCUTANEOUS
  Administered 2018-09-08: 3 [IU] via SUBCUTANEOUS

## 2018-09-04 MED ORDER — INSULIN ASPART 100 UNIT/ML ~~LOC~~ SOLN
0.0000 [IU] | Freq: Every day | SUBCUTANEOUS | Status: DC
  Administered 2018-09-11: 2 [IU] via SUBCUTANEOUS
  Administered 2018-09-12: 3 [IU] via SUBCUTANEOUS
  Administered 2018-09-14: 2 [IU] via SUBCUTANEOUS

## 2018-09-04 MED ORDER — HYDROCHLOROTHIAZIDE 12.5 MG PO CAPS
12.5000 mg | ORAL_CAPSULE | Freq: Every day | ORAL | Status: DC
  Administered 2018-09-05 – 2018-09-18 (×14): 12.5 mg via ORAL
  Filled 2018-09-04 (×14): qty 1

## 2018-09-04 MED ORDER — TRAZODONE HCL 50 MG PO TABS
25.0000 mg | ORAL_TABLET | Freq: Every evening | ORAL | Status: DC | PRN
  Administered 2018-09-06 – 2018-09-11 (×6): 50 mg via ORAL
  Filled 2018-09-04 (×6): qty 1

## 2018-09-04 MED ORDER — AMIODARONE HCL 200 MG PO TABS: 200.0000 mg | ORAL_TABLET | Freq: Every day | ORAL | 1 refills | Status: DC

## 2018-09-04 MED ORDER — AMLODIPINE BESYLATE 2.5 MG PO TABS: 2.5000 mg | ORAL_TABLET | Freq: Every day | ORAL | 0 refills | Status: DC

## 2018-09-04 MED ORDER — ADULT MULTIVITAMIN W/MINERALS CH
1.0000 | ORAL_TABLET | Freq: Every day | ORAL | Status: DC
  Administered 2018-09-05 – 2018-09-18 (×14): 1 via ORAL
  Filled 2018-09-04 (×14): qty 1

## 2018-09-04 MED ORDER — AMIODARONE HCL 200 MG PO TABS
200.0000 mg | ORAL_TABLET | Freq: Every day | ORAL | Status: DC
  Administered 2018-09-05 – 2018-09-18 (×14): 200 mg via ORAL
  Filled 2018-09-04 (×14): qty 1

## 2018-09-04 MED ORDER — DIPHENHYDRAMINE HCL 12.5 MG/5ML PO ELIX: 12.5000 mg | ORAL_SOLUTION | Freq: Four times a day (QID) | ORAL | Status: DC | PRN

## 2018-09-04 MED ORDER — PANTOPRAZOLE SODIUM 40 MG PO TBEC
40.0000 mg | DELAYED_RELEASE_TABLET | Freq: Every day | ORAL | Status: DC
  Administered 2018-09-05 – 2018-09-18 (×14): 40 mg via ORAL
  Filled 2018-09-04 (×14): qty 1

## 2018-09-04 MED ORDER — AMLODIPINE BESYLATE 2.5 MG PO TABS
2.5000 mg | ORAL_TABLET | Freq: Every day | ORAL | Status: DC
  Administered 2018-09-05 – 2018-09-18 (×14): 2.5 mg via ORAL
  Filled 2018-09-04 (×14): qty 1

## 2018-09-04 MED ORDER — BISACODYL 10 MG RE SUPP
10.0000 mg | Freq: Every day | RECTAL | Status: DC | PRN
  Administered 2018-09-06: 10 mg via RECTAL
  Filled 2018-09-04: qty 1

## 2018-09-04 MED ORDER — ASPIRIN 81 MG PO CHEW
81.0000 mg | CHEWABLE_TABLET | Freq: Every day | ORAL | Status: DC
  Administered 2018-09-05 – 2018-09-18 (×14): 81 mg via ORAL
  Filled 2018-09-04 (×14): qty 1

## 2018-09-04 MED ORDER — MAGNESIUM OXIDE 400 (241.3 MG) MG PO TABS
200.0000 mg | ORAL_TABLET | Freq: Two times a day (BID) | ORAL | Status: DC
  Administered 2018-09-04 – 2018-09-05 (×2): 200 mg via ORAL
  Filled 2018-09-04 (×2): qty 1

## 2018-09-04 MED ORDER — ATORVASTATIN CALCIUM 40 MG PO TABS
40.0000 mg | ORAL_TABLET | Freq: Every day | ORAL | Status: DC
  Administered 2018-09-05 – 2018-09-18 (×14): 40 mg via ORAL
  Filled 2018-09-04 (×14): qty 1

## 2018-09-04 MED ORDER — ALUM & MAG HYDROXIDE-SIMETH 200-200-20 MG/5ML PO SUSP: 30.0000 mL | ORAL | Status: DC | PRN

## 2018-09-04 MED ORDER — FLEET ENEMA 7-19 GM/118ML RE ENEM: 1.0000 | ENEMA | Freq: Once | RECTAL | Status: DC | PRN

## 2018-09-04 MED ORDER — LORAZEPAM 1 MG PO TABS
1.0000 mg | ORAL_TABLET | Freq: Every evening | ORAL | Status: DC | PRN
  Administered 2018-09-08: 1 mg via ORAL
  Filled 2018-09-04: qty 1

## 2018-09-04 NOTE — Progress Notes (Signed)
   09/04/18 0600  Provider Notification  Provider Name/Title Dorene Ar  Date Provider Notified 09/04/18  Time Provider Notified [phone]  Notification Type Page  Notification Reason Other (Comment) (minimal urine output)

## 2018-09-04 NOTE — Progress Notes (Signed)
Inpatient Rehabilitation Admissions Coordinator  I met with patient at bedside for assessment. Also contacted Dr. Herbert Moors by phone and he is in agreement to d/c patient to inpt rehab today. I will alert RN as well as RN CM and make the arrangements to admit today.  Danne Baxter, RN, MSN Rehab Admissions Coordinator [phone] 09/04/2018 10:36 AM

## 2018-09-04 NOTE — Progress Notes (Signed)
[name], MD  Physician  Physical Medicine and Rehabilitation  PMR Pre-admission  Signed  Date of Service:  09/04/2018 9:23 AM       Related encounter: Admission (Current) from 08/30/2018 in [facility]      Signed         Show:Clear all _0 Manual_1 Template_2 Copied  Added by: _3 Cristina Gong, RN_4 Kirsteins, Luanna Salk, MD  _5 Hover for details  PMR Admission Coordinator Pre-Admission Assessment  Patient: Jason Farrell. is an 78 y.o., male MRN: [id number] DOB: [birth year] Height: _6  (167.6 cm) Weight: 86 kg  Insurance Information HMO:     PPO:      PCP:      IPA:      80/20:      OTHER: no HMO PRIMARY: Medicare a and b      Policy#: [id number]      Subscriber: pt Benefits:  Phone #: passport one online     Name: 08/21/2018 Eff. Date: 02/14/2008     Deduct: $1408      Out of Pocket Max: none      Life Max: none CIR: 100%      SNF: 20 full days Outpatient: 80%     Co-Pay: 20% Home Health: 100%      Co-Pay: none DME: 80%     Co-Pay: 20% Providers: pt choice  SECONDARY: [employer]      Policy#: [id number]      Subscriber: pt  Medicaid Application Date:       Case Manager:  Disability Application Date:       Case Worker:   Emergency Contact Information         Contact Information    Name Relation Home Work Mobile   [name],[name] Spouse   [phone]   buckler,jodie Other   [phone]      Current Medical History  Patient Admitting Diagnosis:  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 MRI brain revealed large meningioma level of cerebral aqueduct with vasogenic edema of posterior medial temporal lobes and  obstructive hydrocephalus. He was referred to [facility] for evaluation and underwent Craniotomy withresection of pineal region massand placement of EVDon 08/11/2018 by Dr. [name]. Post procedure treated with decadron and IV Keppra X 7 days. Drain removed with 1/30 and on 1/31 he developed decrease in LOC requiring transfer to ICU. 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 MRI brain showed small amount of hemorrhage at resection site with focal hemorrhage/clot within right ventricle and unchanged appearance of increased T2/Flair signal. CTA head/neck done reveling narrow/diminished flow in right V4 segment question due to dissection, thrombus or vasospasm. He with resultant confusion with agitation and loss of bilateral vision. Neurology felt that patient'sMS changes were felt to bedue to delirium and did not feel seizures were cause -- d/c Keppra after 7 days. Repeat CT head showed no change in small left parietal SDH with moderate R>L ventriculomegaly and no significant interval change. Ophthalmology (Dr. 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 To monitor over several weeks and to follow up with neuro ophthalmologist in the future. Left corneal abrasion treated with proparacaine and erythromycin ointment. ABLA being  monitored, hypocalcemia/hypokalemia treated with supplementation and leucocytosis felt to be reactive due to steroids. Plans were to admit patient to [facility] on 02/07 but he developed increase in confusion and lethargy.CBC showed rise in WBC to 30 and LP/BC done for work up. CSF/BC negative.Repeat CT head showedimprovement in intraventricular blood and intraventricular air. Leucocytosis felt to be due to steroids.Seroquel added at nights to help with sleep wake disruption and Zyprexa being used prn agitation during the day. He developed SVT with HR up  to 150's on 2/11 and lopressor added with resolution. On dysphagia 3 diet with question of aspiration --MBS not done due to cognitive deficits as well as body habitus. He continued to be confused,  oriented to self only, has visual deficits and responds to visual threat at times, continues to wax and wane with poor safety awareness and impulsivity.    Patient admitted to [facility] 08/28/2018. Developed increasing confusion, lethargy and SVT on 09/01/2018. Cardiology tried Lopressor without any improvement and adenosine had been given. Placed on Amiodarone drip and readmitted patient to acute hospital. No recurrent SVT on Tele.  Neurology consulted EEG showed no evidence of epileptiform activity on 2/17.Continue conservative management of delirium. Continue nightly Seroquel.  Cardiology consulted. Patient required 2 doses of adenosine and was loaded with amiodarone. Continue metoprolol. Patient more alert but remains confused. Patient ready for readmission today 09/04/2018.  Patient's medical record from [facility] has been reviewed by the rehabilitation admission coordinator and physician.   Past Medical History      Past Medical History:  Diagnosis Date  . Coronary artery disease   . Diabetes mellitus without complication ([place])   . Dyslipidemia   . GERD (gastroesophageal reflux disease)   . Hyperlipidemia   . Hypertension   . MI (myocardial infarction) ([place])   . Osteoarthritis     Family History   family history includes Alcohol abuse in his father; COPD in his sister; Cancer in his sister; Heart disease in his sister; Hyperlipidemia in his father and sister; Hypertension in his father and sister.  Prior Rehab/Hospitalizations Has the patient had major [facility] during [facility] days [facility] to [facility]? [facility] 09[facility]2018 when patient readmitted to acute hospital             Current Medications  Current Facility-Administered Medications:   .   stroke: mapping our early stages of recovery book, , Does not apply, Once, Roland Rack P, MD .  0.9 %  sodium chloride infusion, , Intravenous, PRN, Damita Lack, MD, Last Rate: 20 mL/hr at 09/02/18 0356 .  acetaminophen (TYLENOL) tablet 650 mg, 650 mg, Oral, Q6H PRN, 650 mg at 09/01/18 2132 **OR** acetaminophen (TYLENOL) suppository 650 mg, 650 mg, Rectal, Q6H PRN, Karmen Bongo, MD .  adenosine (ADENOCARD) 6 MG/2ML injection 12 mg, 12 mg, Intravenous, Once, Josue Hector, MD .  amiodarone (PACERONE) tablet 200 mg, 200 mg, Oral, BID, Fay Records, MD, 200 mg at 09/03/18 2215 .  amLODipine (NORVASC) tablet 2.5 mg, 2.5 mg, Oral, Daily, Fay Records, MD, 2.5 mg at 09/03/18 0951 .  aspirin chewable tablet 81 mg, 81 mg, Oral, Daily, Karmen Bongo, MD, 81 mg at 09/03/18 0951 .  atorvastatin (LIPITOR) tablet 40 mg, 40 mg, Oral, Daily, Karmen Bongo, MD, 40 mg at 09/03/18 0951 .  docusate sodium (COLACE) capsule 100 mg, 100 mg, Oral, BID, Karmen Bongo, MD, 100 mg at 09/03/18 2215 .  feeding supplement (GLUCERNA SHAKE) (GLUCERNA SHAKE) liquid  237 mL, 237 mL, Oral, BID BM, Amin, Ankit Chirag, MD, 237 mL at 09/03/18 1704 .  lisinopril (PRINIVIL,ZESTRIL) tablet 20 mg, 20 mg, Oral, Daily, 20 mg at 09/03/18 0951 **AND** hydrochlorothiazide (MICROZIDE) capsule 12.5 mg, 12.5 mg, Oral, Daily, Karmen Bongo, MD, 12.5 mg at 09/03/18 0951 .  insulin aspart (novoLOG) injection 0-15 Units, 0-15 Units, Subcutaneous, TID WC, Amin, Ankit Chirag, MD, 3 Units at 09/04/18 0846 .  insulin aspart (novoLOG) injection 0-5 Units, 0-5 Units, Subcutaneous, QHS, Karmen Bongo, MD .  metoprolol succinate (TOPROL-XL) 24 hr tablet 25 mg, 25 mg, Oral, BID WC, Amin, Ankit Chirag, MD, 25 mg at 09/04/18 0846 .  multivitamin with minerals tablet 1 tablet, 1 tablet, Oral, Daily, Purohit, Shrey C, MD, 1 tablet at 09/03/18 1709 .  OLANZapine zydis (ZYPREXA) disintegrating tablet 5 mg, 5 mg, Oral, Q6H PRN,  Karmen Bongo, MD, 5 mg at 09/02/18 2045 .  ondansetron (ZOFRAN) tablet 4 mg, 4 mg, Oral, Q6H PRN **OR** ondansetron (ZOFRAN) injection 4 mg, 4 mg, Intravenous, Q6H PRN, Karmen Bongo, MD .  pantoprazole (PROTONIX) EC tablet 40 mg, 40 mg, Oral, Daily, Karmen Bongo, MD, 40 mg at 09/03/18 0951 .  QUEtiapine (SEROQUEL) tablet 12.5 mg, 12.5 mg, Oral, Daily, Karmen Bongo, MD, 12.5 mg at 09/03/18 0951 .  QUEtiapine (SEROQUEL) tablet 50 mg, 50 mg, Oral, QHS, Karmen Bongo, MD, 50 mg at 09/03/18 2215 .  sodium chloride flush (NS) 0.9 % injection 3 mL, 3 mL, Intravenous, Q12H, Karmen Bongo, MD, 3 mL at 09/03/18 2216  Patients Current Diet:      Diet Order                  Diet heart healthy/carb modified Room service appropriate? Yes; Fluid consistency: Thin  Diet effective now               Precautions / Restrictions Precautions Precautions: Fall Precautions/Special Needs: Behavior Precaution Comments: impaired vision Restrictions Weight Bearing Restrictions: No   Has the patient had 2 or more falls or a fall with injury in the past year?No  Prior Activity Level Community (5-7x/wk): was independent; active and driving. Family noting declining memory since summer 2019  Prior Functional Level Do you want Prior Function Level of Independence: Independent Comments: Indep with shuffling gait. Retired Airline pilot. Hobbies include woodworking, welding, watching Jeopardy/History Channel from other? Self Care: Did the patient need help bathing, dressing, using the toilet or eating?  Independent  Indoor Mobility: Did the patient need assistance with walking from room to room (with or without device)? Independent  Stairs: Did the patient need assistance with internal or external stairs (with or without device)? Independent  Functional Cognition: Did the patient need help planning regular tasks such as shopping or remembering to take medications?  Independent  Home Assistive Devices / Equipment Home Assistive Devices/Equipment: CBG Meter, Eyeglasses  Prior Device Use: Indicate devices/aids used by the patient prior to current illness, exacerbation or injury? None of the above  Prior Functional Level Vocation: Water engineer) Comments: Indep with shuffling gait. Retired Airline pilot. Hobbies include woodworking, welding, watching Jeopardy/History Channel   Prior Functional Level Current Functional Level  Bed Mobility  Independent  supine to sit Mod assist, difficulty initiating movement to EOB despite step by step cues; mod assist BLEs to EOB towards left side.  Transfers   Independent  mod to max assist and HHA to maintain balance once standing  Mobility - Walk/Wheelchair  Independent but shuffling gait   Mobility - Ambulation/Gait  shuffling  gait Mod assist, +2 physical assistance 8 feet with bilateral HHA. Right foot lagging behind requiring increased effort to step. Fatigue and bilateral knee instability   Upper Body Dressing  independent Maximal assistance, Sitting  Lower Body Dressing  Independent Maximal assistance, +2 for physical assistance, Sit to/from stand  Grooming  Independent Sitting, Maximal assistance  Eating/Drinking  Independent Moderate assistance, Sitting  Toilet Transfer  Independent Maximal assistance, BSC, Stand-pivot, +2 for physical assistance  Bladder Continence  continent   external catheter  Bowel Management   continent   incontinent LBM smear 2/18  Stair Climbing  independent   not attempted  Communication  intact   alert; confused  Memory  increasing confusion since summer 2019 Decreased short-term memory  Cooking/Meal Prep   independent      Housework   independent   Money Management   wife assisted   Driving   independent     Special needs/care consideration BiPAP/CPAP yes pta CPM n/a Continuous Drip IV n/a Dialysis n/a Life Vest n/a Oxygen n/a Special  Bed fall precautions due to decreased safety awareness and vision loss Trach Size n/a Wound Vac n/a Skin left arm ecchymosis; groin and scrotum with moisture associated skin damage; rash to lower back; surgical incision to posterior head; Stage II to buttocks                       Bowel mgmt: incontinent LBM 2/18 smear Bladder mgmt: external catheter Diabetic mgmt yes  Previous Home Environment Living Arrangements: Spouse/significant other  Lives With: Spouse Available Help at Discharge: Family, Available 24 hours/day Type of Home: House Home Layout: Two level, Able to live on main level with bedroom/bathroom Alternate Level Stairs-Rails: Left Alternate Level Stairs-Number of Steps: 3 steps, landing and then 12 steps Home Access: Stairs to enter Entrance Stairs-Rails: Right, Left Entrance Stairs-Number of Steps: 3 Bathroom Shower/Tub: Multimedia programmer: Standard Bathroom Accessibility: Yes How Accessible: Accessible via walker Humphreys: No Additional Comments: can set up bedroom downstairs  Discharge Living Setting Plans for Discharge Living Setting: Patient's home, Lives with (comment) Type of Home at Discharge: House Discharge Home Layout: Two level, Able to live on main level with bedroom/bathroom, Full bath on main level Alternate Level Stairs-Rails: Left Alternate Level Stairs-Number of Steps: 3 steps, landing, then 12 steps Discharge Home Access: Stairs to enter Entrance Stairs-Rails: Right, Left, Can reach both Entrance Stairs-Number of Steps: 3 Discharge Bathroom Shower/Tub: Tub/shower unit, Curtain Discharge Bathroom Toilet: Standard Discharge Bathroom Accessibility: Yes How Accessible: Accessible via walker Does the patient have any problems obtaining your medications?: No  Social/Family/Support Systems Patient Roles: Spouse, Parent Contact Information: wife, Manuela Schwartz cell [phone] Anticipated Caregiver: wife Anticipated Caregiver's  Contact Information: see above Ability/Limitations of Caregiver: no limitations Caregiver Availability: 24/7 Discharge Plan Discussed with Primary Caregiver: Yes Is Caregiver In Agreement with Plan?: Yes Does Caregiver/Family have Issues with Lodging/Transportation while Pt is in Rehab?: No  Goals/Additional Needs Patient/Family Goal for Rehab: supervision to min asisst with PT, OT, and SLP Expected length of stay: ELOS 10 to 14 days Special Service Needs: new blindness postoperatively Pt/Family Agrees to Admission and willing to participate: Yes Program Orientation Provided & Reviewed with Pt/Caregiver Including Roles  & Responsibilities: Yes  Patient Condition: I have reviewed medical records from [facility], spoken with patient and  Spouse. I met with patient at the bedside  for inpatient rehabilitation assessment. Patient previously at [facility] 08/28/2018 until 09/01/2018 when acute medical  issues required readmission to Acute hospital. Patient's case reviewed with Medical team . Please refer to History and Physical for updated Medical issues. Patient will benefit from ongoing PT, OT, and SLP, can actively participate in 3 hours of therapy a day 5 days of the week, and can make measurable gains during the admission.  Patient will also benefit from the coordinated team approach during an Inpatient Acute Rehabilitation admission.  The patient will receive intensive therapy as well as Rehabilitation physician, nursing, social worker, and care management interventions.  Due to bowel management, bladder management, safety, skin/wound care, disease management, medical administration, pain management, patient education the patient requires 24 hour a day rehabilitation nursing.  The patient is currently mod to max with mobility and basic ADLs.  Discharge setting and therapy post discharge at  home with home health is anticipated.  Patient/Family  has agreed to participate in the Acute Inpatient  Rehabilitation Program and will Readmit today to [facility].  Preadmission Screen Completed By:  Cleatrice Burke RN MSN, 09/04/2018 9:24 AM ______________________________________________________________________   Discussed status with Dr. Letta Pate  on  09/04/2018  at  1304 and received telephone approval for admission today.  Admission Coordinator:  Cleatrice Burke RN MSN, time  0109 Date  09/04/2018   Assessment/Plan: Diagnosis:Functional deficits related to meningioma,  1. Does the need for close, 24 hr/day  Medical supervision in concert with the patient's rehab needs make it unreasonable for this patient to be served in a less intensive setting? Yes 2. Co-Morbidities requiring supervision/potential complications: SVT, Dysphagia, CAD, HTN 3. Due to bladder management, bowel management, safety, skin/wound care, disease management, medication administration, pain management and patient education, does the patient require 24 hr/day rehab nursing? Yes 4. Does the patient require coordinated care of a physician, rehab nurse, PT (1-2 hrs/day, 5 days/week), OT (1-2 hrs/day, 5 days/week) and SLP (.5-1 hrs/day, 5 days/week) to address physical and functional deficits in the context of the above medical diagnosis(es)? Yes Addressing deficits in the following areas: balance, endurance, locomotion, strength, transferring, bowel/bladder control, bathing, dressing, feeding, grooming, toileting, cognition and psychosocial support 5. Can the patient actively participate in an intensive therapy program of at least 3 hrs of therapy 5 days a week? Yes 6. The potential for patient to make measurable gains while on inpatient rehab is good 7. Anticipated functional outcomes upon discharge from inpatients are: supervision PT, supervision OT, supervision SLP 8. Estimated rehab length of stay to reach the above functional goals is: 10-14d 9. Anticipated D/C setting: Home 10. Anticipated post D/C  treatments: [place] therapy 11. Overall Rehab/Functional Prognosis: good  [name] M.D. [facility] FAAPM&R (Sports Med, Neuromuscular Med) Diplomate Am Board of Electrodiagnostic Med  Cleatrice Burke RN MSN Admissions Coordinator 09/04/2018        Revision History

## 2018-09-04 NOTE — Progress Notes (Signed)
PROGRESS NOTE    Jason Farrell.  GUR:[id number] DOB: 1941-06-06 DOA: 08/30/2018 PCP: Steele Sizer, MD   Brief Narrative:  78 year old with past medical history relevant for OSA on CPAP, hypertension, hyperlipidemia, type 2 diabetes, coronary artery disease status post PCI COPD who was initially admitted for meningioma resection status post intraventricular and subdural hemorrhage discharged to [facility] inpatient rehab readmitted on 08/30/2018 with recurrent SVT and altered mental status and found to have perioperative stroke.   Assessment & Plan:   Principal Problem:   SVT (supraventricular tachycardia) (HCC) Active Problems:   Essential hypertension   COLD (chronic obstructive lung disease) (HCC)   Obstructive sleep apnea on CPAP   Controlled type 2 diabetes mellitus without complication, without long-term current use of insulin (HCC)   Meningioma (HCC)   Nontraumatic intraventricular intracerebral hemorrhage (HCC)   PRES (posterior reversible encephalopathy syndrome)   Pressure injury of skin   #) Altered mental status due to perioperative stroke: Improving somewhat but continues to be not oriented.  He is more alert - Frequent reorientation -Neurology following, appreciate recommendations - EEG showed no evidence of epileptiform activity on 09/01/2018  #) Meningioma status post resection complicated by hemorrhage: This appears to be stable on recent imaging. -Plan to discharge back to [facility] inpatient rehab  #) SVT: Patient is required 2 doses of adenosine.  He additionally was loaded up with amiodarone. -Cardiology following, appreciate recommendations -Continue amiodarone 200 mg twice daily -Continue metoprolol succinate 25 mg twice daily  #) Hypertension/hyperlipidemia: -Continue beta-blocker -Continue lisinopril 20 mg daily -Continue HCTZ 12.5 mg daily - Amlodipine 2.5 mg daily -Continue atorvastatin 40 mg daily  #) Coronary artery disease status post  PCI: -Continue aspirin 81 mg daily -Per above continue beta-blocker, ACE inhibitor, statin  #) Type 2 diabetes: -Sliding scale insulin, AC at bedtime  Fluids: Tolerating p.o. Electrolytes: Monitor and supplement Nutrition: Heart healthy carb restricted diet   Prophylaxis: SCDs  Disposition: Pending improvement of mental status   Full code  Consultants:   Cardiology  Neurology  Procedures:  Echo 08/30/2018: 1. The left ventricle has a visually estimated ejection fraction of of 55%. The cavity size was normal. There is moderately increased left ventricular wall thickness. Left ventricular diastolic Doppler parameters are consistent with impaired relaxation  No evidence of left ventricular regional wall motion abnormalities.  2. The right ventricle has normal systolic function. The cavity was normal. There is no increase in right ventricular wall thickness.  3. Trivial pericardial effusion.  4. The mitral valve is normal in structure. No evidence of mitral valve stenosis. Mild regurgitation.  5. The tricuspid valve is normal in structure.  6. The aortic valve is tricuspid Aortic valve regurgitation is trivial by color flow Doppler. no stenosis of the aortic valve.  7. The aortic root and ascending aorta are normal in size and structure.  8. The inferior vena cava was normal in size with <50% respiratory variability.  9. No evidence of left ventricular regional wall motion abnormalities. 10. Right atrial pressure is estimated at 8 mmHg. 11. No complete TR doppler jet so unable to estimate PA systolic pressure.   EEG 09/01/2018:IMPRESSION: This is a normal awake and drowsy electroencephalogram.  No epileptiform activity is noted.      Antimicrobials:   None   Subjective: This morning the patient does not have any complaints.  He is sleepy but more responsive to commands.  He is not oriented to place or to time or to situation. Objective: Vitals:  09/04/18 0509 09/04/18  0830 09/04/18 0846 09/04/18 0919  BP: (!) 131/56 120/67 120/67   Pulse: 94 (!) 101 (!) 101   Resp: 18 18    Temp: 97.6 F (36.4 C)     TempSrc:      SpO2: 98% 98%    Weight: 86 kg   86 kg  Height:    5\' 6"  (1.676 m)    Intake/Output Summary (Last 24 hours) at 09/04/2018 0931 Last data filed at 09/04/2018 0900 Gross per 24 hour  Intake 560 ml  Output 1075 ml  Net -515 ml   Filed Weights   09/03/18 0510 09/04/18 0509 09/04/18 0919  Weight: 86 kg 86 kg 86 kg    Examination:  General exam: No acute distress Respiratory system: No increased work of breathing, diminished lung sounds at bases, no wheezes, crackles, rhonchi Cardiovascular system: Regular rate and rhythm, no murmurs Gastrointestinal system: Soft, nondistended, no rebound or guarding, plus bowel sounds Central nervous system: Alert but not oriented, occasionally follows commands, no notable weakness that is focal Extremities: 1+ lower extremity edema Skin: No rashes over visible skin Psychiatry: Unable to assess due to medical condition    Data Reviewed: I have personally reviewed following labs and imaging studies  CBC: Recent Labs  Lab 08/29/18 0659 08/30/18 0935 08/31/18 0546 09/04/18 0424  WBC 11.0* 10.5 9.3 7.3  NEUTROABS 9.0*  --   --   --   HGB 12.0* 12.7* 11.3* 12.3*  HCT 36.1* 39.6 36.3* 37.8*  MCV 88.9 89.8 89.4 89.8  PLT 298 343 291 950   Basic Metabolic Panel: Recent Labs  Lab 08/31/18 0546 09/01/18 0745 09/02/18 0450 09/03/18 0329 09/04/18 0424  NA 140 140 138 140 138  K 3.5 3.2* 3.5 4.1 3.2*  CL 107 106 103 101 100  CO2 25 26 28 30 24   GLUCOSE 165* 167* 172* 188* 186*  BUN 12 8 5* <5* 12  CREATININE 0.77 0.71 0.75 0.80 0.89  CALCIUM 8.4* 8.2* 8.4* 8.7* 8.6*  MG  --  1.7 1.9 1.7 1.7   GFR: Estimated Creatinine Clearance: 73.7 mL/min (by C-G formula based on SCr of 0.89 mg/dL). Liver Function Tests: Recent Labs  Lab 08/29/18 0659 09/01/18 0745 09/02/18 0450 09/03/18 0329   AST 21 17 15  14*  ALT 34 34 31 30  ALKPHOS 68 75 76 83  BILITOT 0.7 0.6 0.5 0.8  PROT 5.4* 5.3* 5.1* 5.9*  ALBUMIN 2.4* 2.2* 2.1* 2.3*   No results for input(s): LIPASE, AMYLASE in the last 168 hours. No results for input(s): AMMONIA in the last 168 hours. Coagulation Profile: No results for input(s): INR, PROTIME in the last 168 hours. Cardiac Enzymes: Recent Labs  Lab 08/30/18 0935  CKTOTAL 29*  CKMB 1.4  TROPONINI <0.03   BNP (last 3 results) No results for input(s): PROBNP in the last 8760 hours. HbA1C: No results for input(s): HGBA1C in the last 72 hours. CBG: Recent Labs  Lab 09/03/18 0715 09/03/18 1201 09/03/18 1636 09/03/18 2208 09/04/18 0740  GLUCAP 195* 243* 125* 144* 167*   Lipid Profile: No results for input(s): CHOL, HDL, LDLCALC, TRIG, CHOLHDL, LDLDIRECT in the last 72 hours. Thyroid Function Tests: No results for input(s): TSH, T4TOTAL, FREET4, T3FREE, THYROIDAB in the last 72 hours. Anemia Panel: No results for input(s): VITAMINB12, FOLATE, FERRITIN, TIBC, IRON, RETICCTPCT in the last 72 hours. Sepsis Labs: No results for input(s): PROCALCITON, LATICACIDVEN in the last 168 hours.  No results found for this or any  previous visit (from the past 240 hour(s)).       Radiology Studies: Mr Jeri Cos MP Contrast  Result Date: 09/02/2018 CLINICAL DATA:  78 y/o  M; status post meningioma resection. EXAM: MRI HEAD WITHOUT AND WITH CONTRAST TECHNIQUE: Multiplanar, multiecho pulse sequences of the brain and surrounding structures were obtained without and with intravenous contrast. CONTRAST:  8.5 cc Gadavist. COMPARISON:  08/30/2018 CT head.  07/21/2018 MRI head. FINDINGS: Brain: Resection cavity superior to the cerebellum and within the splenium of corpus callosum contains T1 hyperintense blood products with susceptibility blooming and smooth thin linear enhancement at the margins compatible postoperative changes. The inferomedial cavity margin there is a  nodular focus of enhancement measuring 14 x 11 x 10 mm with similar signal characteristics to the meningioma prior MRI compatible with residual tumor (series 11, image 22 and series 14, image 13). Residual tumor is associated with the vein of Galen as well as the origins of internal cerebral veins. Within the splenium of corpus callosum and right forceps of corpus callosum there is reduced diffusion compatible with ischemia. Stable ventricle size. Trace volume of hemorrhage is present within the occipital horn of the right lateral ventricle. There is a thin extradural postop fluid collection subjacent to parieto-occipital craniotomy without associated mass effect. There is an additional thin 4 mm subdural collection over the left parietal lobe. Trace volume of hemorrhage along the tentorium/posterior falx. No mass effect or herniation. Small chronic lacunar infarct within the left basal ganglia with hemosiderin staining. Stable punctate nonspecific T2 FLAIR hyperintensities in subcortical and periventricular white matter are compatible with mild chronic microvascular ischemic changes. Stable mild volume loss of the brain. Vascular: Normal flow voids. Skull and upper cervical spine: Normal marrow signal. Sinuses/Orbits: Negative. Other: None. IMPRESSION: 1. Postoperative changes related to parieto-occipital craniotomy and resection of meningioma. Small volume of associated intracranial hemorrhage without mass effect. 2. 14 mm focus of residual nodular enhancement at the inferomedial cavity margin compatible with residual tumor. 3. Acute ischemia with splenium in right forceps of splenium of corpus callosum. 4. Stable lateral and third ventricle size. No new focus of mass effect. No herniation. Electronically Signed   By: Kristine Garbe M.D.   On: 09/02/2018 13:42        Scheduled Meds: .  stroke: mapping our early stages of recovery book   Does not apply Once  . adenosine (ADENOCARD) IV  12 mg  Intravenous Once  . amiodarone  200 mg Oral BID  . amLODipine  2.5 mg Oral Daily  . aspirin  81 mg Oral Daily  . atorvastatin  40 mg Oral Daily  . docusate sodium  100 mg Oral BID  . feeding supplement (GLUCERNA SHAKE)  237 mL Oral BID BM  . lisinopril  20 mg Oral Daily   And  . hydrochlorothiazide  12.5 mg Oral Daily  . insulin aspart  0-15 Units Subcutaneous TID WC  . insulin aspart  0-5 Units Subcutaneous QHS  . metoprolol succinate  25 mg Oral BID WC  . multivitamin with minerals  1 tablet Oral Daily  . pantoprazole  40 mg Oral Daily  . QUEtiapine  12.5 mg Oral Daily  . QUEtiapine  50 mg Oral QHS  . sodium chloride flush  3 mL Intravenous Q12H   Continuous Infusions: . sodium chloride 20 mL/hr at 09/02/18 0356     LOS: 5 days    Time spent: [place], MD [facility]  If 7PM-7AM,  please contact night-coverage [email or web address] Password [facility] 09/04/2018, 9:31 AM

## 2018-09-04 NOTE — H&P (Signed)
Physical Medicine and Rehabilitation Admission H&P     CC: Functional deficits due to meningioma resection complicated by SDH/IVH     HPI: Jason Farrell. 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  He underwent craniotomy with resection of pineal region mass and placement of EVD on 08/11/2018 by Drs. MRI at [facility].  Postop course significant for decrease in level of consciousness due to IVH with focal hemorrhage and clot in right ventricle.  CTA head/neck done revealing narrowed/diminished flow in right V4 segment question due to dissection, thrombus or vasospasm.  Neurology consulted for input and felt that delirium was responsible for patient's fluctuating mental status with confusion and agitation.  He is also had loss of bilateral vision and ophthalmology felt that visual loss was cortical in nature likely due to PR ES over PROM.  He developed increasing lethargy with leukocytosis on 02/07 and work-up was negative for infection.  He did have episode of SVT on 2/11 and beta-blocker with resolution.  He continues to require Seroquel to help with sleep-wake disruption as well as Zyprexa to manage agitation.   He was admitted to CIR on 08/28/2018 for intensive rehab to consist of PT, OT and ST at least 3 hours 5 days a week.  Sleep hygiene was improving and he was tolerating the therapy.  He did have recurrent SVT on 2/17 and was started on IV amiodarone and BB titrated upwards.  2D echo done revealing EF 55% with moderate increase in LV wall and trivial AVR. Follow up CT head done due to ongoing issues with confusion and agitation.  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  Dr. Leonel Ramsay felt that patient likely with perioperative stroke with subsequent multifactorial delirium and to treat with supportive care.    MBS done revealing functional swallow with minimal penetration and to continue regular texture/thins. EEG done for work up and was normal study without epileptiform activity.  Heart rate currently controlled.  recommended decreasing amiodarone to daily and follow-up with Dr. Rockey Situ on outpatient basis.  Therapy reevaluation completed revealing patient with apraxia, cognitive deficits as well as balance deficits affecting ADLs as well as mobility.  He was cleared to resume CIR program    Patient comfortable lying in bed, no respiratory distress Review of Systems  Unable to perform ROS: Mental acuity            Past Medical History:  Diagnosis Date  . Coronary artery disease    . Diabetes mellitus without complication ([place])    . Dyslipidemia    . GERD (gastroesophageal reflux disease)    . Hyperlipidemia    . Hypertension    . MI (myocardial infarction) ([place])    . Osteoarthritis             Past Surgical History:  Procedure Laterality Date  . CARDIAC CATHETERIZATION   2006    stent placement  . COLONOSCOPY      . CORONARY ANGIOPLASTY WITH STENT PLACEMENT   2006    stent x 2   . CORONARY ANGIOPLASTY WITH STENT PLACEMENT   1996    stent x 3   . FOOT SURGERY Right    .  HALLUX VALGUS CHEILECTOMY Right 02/25/2015    Procedure: HALLUX VALGUS CHEILECTOMY;  Surgeon: [name], DPM;  Location: [facility] ORS;  Service: Podiatry;  Laterality: Right;           Family History  Problem Relation Age of Onset  . Hyperlipidemia Father    . Hypertension Father    . Alcohol abuse Father    . Cancer Sister          lung  . Hypertension Sister    . Heart disease Sister    . Hyperlipidemia Sister    . COPD Sister        Social History:  Married. Retired IT trainer. Per  reports that he  quit smoking about 24 years ago. His smoking use included cigarettes. He started smoking about 59 years ago. He has a 52.50 pack-year smoking history. He has never used smokeless tobacco. He reports current alcohol use of about 14.0 standard drinks of alcohol per week. He reports that he does not use drugs.      Allergies: No Known Allergies            Medications Prior to Admission  Medication Sig Dispense Refill  . amLODipine (NORVASC) 10 MG tablet Take 1 tablet (10 mg total) by mouth daily. 90 tablet 1  . aspirin 81 MG tablet Take 81 mg by mouth daily.      Marland Kitchen atorvastatin (LIPITOR) 40 MG tablet Take 1 tablet (40 mg total) by mouth daily. 90 tablet 1  . Coenzyme Q10 (CO Q10) 200 MG CAPS Take 200 mg by mouth daily.       Marland Kitchen docusate sodium (COLACE) 100 MG capsule Take 100 mg by mouth 2 (two) times daily.      Marland Kitchen enoxaparin (LOVENOX) 40 MG/0.4ML injection Inject 40 mg into the skin daily.      Marland Kitchen glipiZIDE (GLUCOTROL) 5 MG tablet Take 1 tablet (5 mg total) by mouth 2 (two) times daily before a meal. 180 tablet 0  . lisinopril-hydrochlorothiazide (ZESTORETIC) 20-12.5 MG tablet Take 1 tablet by mouth 2 (two) times daily. 180 tablet 1  . metFORMIN (GLUCOPHAGE-XR) 750 MG 24 hr tablet Take 2 tablets (1,500 mg total) by mouth daily with breakfast. 180 tablet 1  . metoprolol succinate (TOPROL-XL) 50 MG 24 hr tablet Take 1 tablet (50 mg total) by mouth daily. Take with or immediately following a meal. (Patient taking differently: Take 50 mg by mouth at bedtime. Take with or immediately following a meal.) 90 tablet 0  . Multiple Vitamins-Minerals (CENTRUM SILVER PO) Take 1 tablet by mouth daily.       Marland Kitchen OLANZapine zydis (ZYPREXA) 5 MG disintegrating tablet Take 5 mg by mouth every 12 (twelve) hours as needed (agitation).      . Omega-3 Fatty Acids (FISH OIL) 1200 MG CAPS Take 1,200 mg by mouth daily.      Marland Kitchen omeprazole (PRILOSEC) 40 MG capsule Take 1 capsule (40 mg total) by mouth daily. 90 capsule 1  .  QUEtiapine (SEROQUEL) 25 MG tablet Take 12.5 mg by mouth daily.      . QUEtiapine (SEROQUEL) 50 MG tablet Take 50 mg by mouth at bedtime.      . sitaGLIPtin (JANUVIA) 100 MG tablet Take 1 tablet (100 mg total) by mouth daily. 90 tablet 1  . Vitamin D, Cholecalciferol, 1000 units TABS Take 1,000 Units by mouth daily.       Marland Kitchen glucose blood test strip Use as instructed 100 each 12  . Influenza  vac split quadrivalent PF (FLUZONE HIGH-DOSE) 0.5 ML injection Inject 0.5 mLs into the muscle once.           Drug Regimen Review Drug regimen was reviewed and the following issues were identified and addressed was on glipizide and metformin, may need to restart depending on CBGs   Home: [place] expects to be discharged to:: Private residence Living Arrangements: Spouse/significant other Available Help at Discharge: Family, Available 24 hours/day Type of Home: House Home Access: Stairs to enter [employer] of Steps: 3 Entrance Stairs-Rails: Right, Left(cannot reach both) Home Layout: Two level, Able to live on main level with bedroom/bathroom Alternate Level Stairs-Number of Steps: 3 steps, landing and then 12 steps Alternate Level Stairs-Rails: Left Bathroom Shower/Tub: Multimedia programmer: Standard Bathroom Accessibility: Yes Additional Comments: can set up bedroom downstairs  Lives With: Spouse   Functional History: Prior Function Level of Independence: Independent Comments: Indep with shuffling gait. Retired Airline pilot. Hobbies include woodworking, welding, watching Jeopardy/[employer] Channel   Functional Status:  Mobility: Bed Mobility Overal bed mobility: Needs Assistance Bed Mobility: Supine to Sit, Sit to Supine Supine to sit: Mod assist, HOB elevated Sit to supine: Mod assist General bed mobility comments: Pt with difficulty initiating movement to EOB despite step-by-step cues; modA to assist BLEs to EOB towards L-side, modA to assist trunk  elevation from elevated bed height. Once supine, pt with difficulty sequencing movement to reposition in bed Transfers Overall transfer level: Needs assistance Equipment used: 2 person hand held assist, 1 person hand held assist Transfers: Sit to/from Stand Sit to Stand: Mod assist, +2 physical assistance General transfer comment: ModA to assist trunk elevation, pt requiring mod-maxA and HHA to maintain balance once standing Ambulation/Gait Ambulation/Gait assistance: Mod assist, +2 physical assistance Gait Distance (Feet): 8 Feet Gait Pattern/deviations: Step-to pattern, Decreased weight shift to left, Decreased dorsiflexion - right, Shuffle, Leaning posteriorly General Gait Details: Amb 4' forwards and backwards with bilateral HHA and modA+2 to maintain standing balance, R foot lagging behind requiring increased effort to step; increased fatigue and bilateral knee instability noted when pt walking backwards towards EOB. Gait velocity: Decreased Gait velocity interpretation: <1.31 ft/sec, indicative of household ambulator   ADL: ADL Overall ADL's : Needs assistance/impaired Eating/Feeding: Moderate assistance, Sitting Grooming: Sitting, Maximal assistance Upper Body Bathing: Maximal assistance, Sitting Lower Body Bathing: Maximal assistance, +2 for physical assistance, Sit to/from stand Upper Body Dressing : Maximal assistance, Sitting Lower Body Dressing: Maximal assistance, +2 for physical assistance, Sit to/from stand Toilet Transfer: Maximal assistance, BSC, Stand-pivot, +2 for physical assistance Toileting- Clothing Manipulation and Hygiene: Maximal assistance, +2 for physical assistance, Sit to/from stand Tub/ Shower Transfer: Maximal assistance, Stand-pivot, 3 in 1, +2 for physical assistance Functional mobility during ADLs: Maximal assistance, +2 for physical assistance(+2 HHA) General ADL Comments: Pt completed bed mobility, sat EOB a minute or two then completed walking  forwards and back a few paces, and side stepped along EOB. Cognition and vision impacting ADLs.   Cognition: Cognition Overall Cognitive Status: Impaired/Different from baseline Arousal/Alertness: Lethargic Orientation Level: Oriented to person, Oriented to situation, Disoriented to place Attention: Focused Focused Attention: Impaired Focused Attention Impairment: Verbal basic, Functional basic Awareness: Impaired Awareness Impairment: Intellectual impairment Problem Solving: Impaired Problem Solving Impairment: Verbal basic Safety/Judgment: Impaired Cognition Arousal/Alertness: Lethargic( becoming more alert with upright activity) Behavior During Therapy: Flat affect Overall Cognitive Status: Impaired/Different from baseline Area of Impairment: Orientation, Attention, Memory, Following commands, Safety/judgement, Awareness, Problem solving Orientation Level: Disoriented to, Place Current Attention  Level: Sustained Memory: Decreased short-term memory Following Commands: Follows one step commands consistently, Follows multi-step commands inconsistently Safety/Judgement: Decreased awareness of deficits Awareness: Intellectual Problem Solving: Slow processing, Decreased initiation, Difficulty sequencing, Requires verbal cues     Blood pressure 126/63, pulse 85, temperature (!) 97 F (36.1 C), temperature source Oral, resp. rate 18, height 5\' 6"  (1.676 m), weight 86 kg, SpO2 98 %. Physical Exam  Nursing note and vitals reviewed. Constitutional: He appears well-developed and well-nourished.   Neurological:  Oriented to self. Place hospital but unable to state name. Looks straight ahead and does not attempt to make eye contact. He is able to follow occasional simple motor commands with tactile cues.  He was able to see out of left lateral visual field today.     General: No acute distress Mood and affect are appropriate Heart: Regular rate and rhythm no rubs murmurs or extra  sounds Lungs: Clear to auscultation, breathing unlabored, no rales or wheezes Abdomen: Positive bowel sounds, soft nontender to palpation, nondistended Extremities: No clubbing, cyanosis, or edema Skin: No evidence of breakdown, no evidence of rash Neurologic: Cranial nerves II through XII intact, motor strength is 4/5 in bilateral deltoid, bicep, tricep, grip, hip flexor, knee extensors, ankle dorsiflexor and plantar flexor Sensory exam not assessed secondary to very poor concentration and attention Cerebellar exam unable to perform secondary to visual deficits Musculoskeletal: Full range of motion in all 4 extremities. No joint swelling   Lab Results Last 48 Hours  Results for orders placed or performed during the hospital encounter of 08/30/18 (from the past 48 hour(s))  Glucose, capillary     Status: Abnormal    Collection Time: 09/01/18  4:29 PM  Result Value Ref Range    Glucose-Capillary 122 (H) 70 - 99 mg/dL  Glucose, capillary     Status: Abnormal    Collection Time: 09/01/18  9:01 PM  Result Value Ref Range    Glucose-Capillary 172 (H) 70 - 99 mg/dL  Comprehensive metabolic panel     Status: Abnormal    Collection Time: 09/02/18  4:50 AM  Result Value Ref Range    Sodium 138 135 - 145 mmol/L    Potassium 3.5 3.5 - 5.1 mmol/L    Chloride 103 98 - 111 mmol/L    CO2 28 22 - 32 mmol/L    Glucose, Bld 172 (H) 70 - 99 mg/dL    BUN 5 (L) 8 - 23 mg/dL    Creatinine, Ser 0.75 0.61 - 1.24 mg/dL    Calcium 8.4 (L) 8.9 - 10.3 mg/dL    Total Protein 5.1 (L) 6.5 - 8.1 g/dL    Albumin 2.1 (L) 3.5 - 5.0 g/dL    AST 15 15 - 41 U/L    ALT 31 0 - 44 U/L    Alkaline Phosphatase 76 38 - 126 U/L    Total Bilirubin 0.5 0.3 - 1.2 mg/dL    GFR calc non Af Amer >60 >60 mL/min    GFR calc Af Amer >60 >60 mL/min    Anion gap 7 5 - 15      Comment: Performed at [facility], [address], [place], [place] [address]  Magnesium     Status: None    Collection Time: 09/02/18  4:50 AM   Result Value Ref Range    Magnesium 1.9 1.7 - 2.4 mg/dL      Comment: Performed at [facility] [address], [place],  [address]  Glucose, capillary     Status: Abnormal    Collection Time: 09/02/18  7:26 AM  Result Value Ref Range    Glucose-Capillary 161 (H) 70 - 99 mg/dL  Glucose, capillary     Status: Abnormal    Collection Time: 09/02/18 12:30 PM  Result Value Ref Range    Glucose-Capillary 160 (H) 70 - 99 mg/dL  Glucose, capillary     Status: Abnormal    Collection Time: 09/02/18  4:20 PM  Result Value Ref Range    Glucose-Capillary 180 (H) 70 - 99 mg/dL  Glucose, capillary     Status: Abnormal    Collection Time: 09/02/18  9:25 PM  Result Value Ref Range    Glucose-Capillary 216 (H) 70 - 99 mg/dL  Comprehensive metabolic panel     Status: Abnormal    Collection Time: 09/03/18  3:29 AM  Result Value Ref Range    Sodium 140 135 - 145 mmol/L    Potassium 4.1 3.5 - 5.1 mmol/L    Chloride 101 98 - 111 mmol/L    CO2 30 22 - 32 mmol/L    Glucose, Bld 188 (H) 70 - 99 mg/dL    BUN <5 (L) 8 - 23 mg/dL    Creatinine, Ser 0.80 0.61 - 1.24 mg/dL    Calcium 8.7 (L) 8.9 - 10.3 mg/dL    Total Protein 5.9 (L) 6.5 - 8.1 g/dL    Albumin 2.3 (L) 3.5 - 5.0 g/dL    AST 14 (L) 15 - 41 U/L    ALT 30 0 - 44 U/L    Alkaline Phosphatase 83 38 - 126 U/L    Total Bilirubin 0.8 0.3 - 1.2 mg/dL    GFR calc non Af Amer >60 >60 mL/min    GFR calc Af Amer >60 >60 mL/min    Anion gap 9 5 - 15      Comment: Performed at [facility], [address] [place], [place] [address]  Magnesium     Status: None    Collection Time: 09/03/18  3:29 AM  Result Value Ref Range    Magnesium 1.7 1.7 - 2.4 mg/dL      Comment: Performed at [facility] [address] [place], [place] [address]  Glucose, capillary     Status: Abnormal    Collection Time: 09/03/18  7:15 AM  Result Value Ref Range    Glucose-Capillary 195 (H) 70 - 99 mg/dL    Comment 1 Notify RN      Comment 2  Document in Chart    Glucose, capillary     Status: Abnormal    Collection Time: 09/03/18 12:01 PM  Result Value Ref Range    Glucose-Capillary 243 (H) 70 - 99 mg/dL    Comment 1 Notify RN      Comment 2 Document in Chart         Imaging Results (Last 48 hours)  Mr Jeri Cos Wo Contrast   Result Date: 09/02/2018 CLINICAL DATA:  78 y/o  M; status post meningioma resection. EXAM: MRI HEAD WITHOUT AND WITH CONTRAST TECHNIQUE: Multiplanar, multiecho pulse sequences of the brain and surrounding structures were obtained without and with intravenous contrast. CONTRAST:  8.5 cc Gadavist. COMPARISON:  08/30/2018 CT head.  07/21/2018 MRI head. FINDINGS: Brain: Resection cavity superior to the cerebellum and within the splenium of corpus callosum contains T1 hyperintense blood products with susceptibility blooming and smooth thin linear enhancement at the margins compatible postoperative changes.  The inferomedial cavity margin there is a nodular focus of enhancement measuring 14 x 11 x 10 mm with similar signal characteristics to the meningioma prior MRI compatible with residual tumor (series 11, image 22 and series 14, image 13). Residual tumor is associated with the vein of Galen as well as the origins of internal cerebral veins. Within the splenium of corpus callosum and right forceps of corpus callosum there is reduced diffusion compatible with ischemia. Stable ventricle size. Trace volume of hemorrhage is present within the occipital horn of the right lateral ventricle. There is a thin extradural postop fluid collection subjacent to parieto-occipital craniotomy without associated mass effect. There is an additional thin 4 mm subdural collection over the left parietal lobe. Trace volume of hemorrhage along the tentorium/posterior falx. No mass effect or herniation. Small chronic lacunar infarct within the left basal ganglia with hemosiderin staining. Stable punctate nonspecific T2 FLAIR hyperintensities in  subcortical and periventricular white matter are compatible with mild chronic microvascular ischemic changes. Stable mild volume loss of the brain. Vascular: Normal flow voids. Skull and upper cervical spine: Normal marrow signal. Sinuses/Orbits: Negative. Other: None. IMPRESSION: 1. Postoperative changes related to parieto-occipital craniotomy and resection of meningioma. Small volume of associated intracranial hemorrhage without mass effect. 2. 14 mm focus of residual nodular enhancement at the inferomedial cavity margin compatible with residual tumor. 3. Acute ischemia with splenium in right forceps of splenium of corpus callosum. 4. Stable lateral and third ventricle size. No new focus of mass effect. No herniation. Electronically Signed   By: Kristine Garbe M.D.   On: 09/02/2018 13:42             Medical Problem List and Plan: 1.  decline in self care and mobility secondary to meningioma with IVH PT, OT, SLP re evals in am 2.  DVT Prophylaxis/Anticoagulation: Pharmaceutical: Lovenox 3. Pain Management: tylenol prn. 4. Mood: LCSW to follow for evaluation and support.  5. Neuropsych: This patient is not capable of making decisions on his own behalf. 6. Skin/Wound Care: Routine pressure relief measures.  7. Fluids/Electrolytes-hypokalemia/Nutrition: Monitor I/O. Check lytes/MG levels in am--K+ down to 3.2 today.KCL 55meq BID supplement Continue prostat.  8. Recurrent SVT: Resolved. Continue amiodarone and metoprolol. Continue to monitor HR bid. Add Kdur for supplement. Add magnesium for supplement.   9. HTN: ON Lisinopril, Microzide, Metoprolol and Norvasc. Monitor BP bid microzide may be contributing to low K 10. Delirium/Hallucinations: Continue seroquel bid and titrate as indicated. Continue  ativan prn at nights and monitor sleep wake cycle.  Enclosure bed for now 11. T2DM: Monitor BS ac/hs. Continue SSI for now.  12. OSA/Hypoxia: Needed oxygen with activity today. Resume  CPAP--in room. Wean oxygen as able. Will check CXR to rule out fluid overload.    Post Admission Physician Evaluation: 1. Functional deficits secondary  to meningioma with IVH . 2. Patient admitted to receive collaborative, interdisciplinary care between the physiatrist, rehab nursing staff, and therapy team. 3. Patient's level of medical complexity and substantial therapy needs in context of that medical necessity cannot be provided at a lesser intensity of care. 4. Patient has experienced substantial functional loss from his/her baseline. Judging by the patient's diagnosis, physical exam, and functional history, the patient has potential for functional progress which will result in measurable gains while on inpatient rehab.  These gains will be of substantial and practical use upon discharge in facilitating mobility and self-care at the household level. 5. Physiatrist will provide 24 hour management of medical needs as  well as oversight of the therapy plan/treatment and provide guidance as appropriate regarding the interaction of the two. 6. 24 hour rehab nursing will assist in the management of  bladder management, bowel management, safety, skin/wound care, disease management, medication administration, pain management and patient education  and help integrate therapy concepts, techniques,education, etc. 7. PT will assess and treat for:pre gait, gait training, endurance , safety, equipment, neuromuscular re education  .  Goals are: supervision. 8. OT will assess and treat for ADLs, Cognitive perceptual skills, Neuromuscular re education, safety, endurance, equipment  .  Goals are: supervision.  9. SLP will assess and treat for memory, attention, concentration, thought organization,  .  Goals are: minimal assist. 10. Case Management and Social Worker will assess and treat for psychological issues and discharge planning. 11. Team conference will be held weekly to assess progress toward goals and to  determine barriers to discharge. 12.  Patient will receive at least 3 hours of therapy per day at least 5 days per week. 13. ELOS and Prognosis: 11-15d fair  "I have personally performed a face to face diagnostic evaluation of this patient.  Additionally, I have reviewed and concur with the physician assistant's documentation above."  [name] M.D. [facility] FAAPM&R (Sports Med, Neuromuscular Med) Diplomate Am Board of [place], PA-C

## 2018-09-04 NOTE — Discharge Summary (Signed)
Physician Discharge Summary  Jason Farrell. WGY:[id number] DOB: [birth year] DOA: 08/30/2018  PCP: Steele Sizer, MD  Admit date: 08/30/2018 Discharge date: 09/04/2018  Admitted From: CIR Disposition: CIR  Recommendations for Outpatient Follow-up:  1. Follow up with PCP in 1-2 weeks 2. Please obtain BMP/CBC in one week   Home Health: N/A Equipment/Devices: N/A  Discharge Condition: stable CODE STATUS: FULL Diet recommendation: Heart Healthy / Carb Modified  Brief/Interim Summary:  #) AMS d/t peri-operative CVA: Pt was admitted with concern for AMS. MRI brain showed peri-operative CVA. Neurology was c/s and felt that there was no need to preform w/u as this CVA happened peri-operatively. Pt did have an EEG that did not show seizures. He was continued on atypical antipsychotics PRN and quietapine at night. Pt was at baseline per CIR staff.  #) Meningioma s/p resection c/b hemorrhage: This surgery was preformed at [facility] and Pt was admitted from there to CIR. Repeat imaging showed residual tumor but no significant acute intracranial process other than peri-operative CVA.  #) SVT: Pt developed SVT on the floor requiring 2 doses of adenosine. Pt was loaded with amiodarone and his home metoprolol succinate dose was changed to BID. He was d/c on amiodarone with o/p Cardiology f/u.  #) HTN/HLD: Pt was continued on BB, lisinopril, HCTZ. Amlodipine dose was decreased to 2.5mg  QD. Statin was continued.  #) CAD s/p PCI: Pt was continued on aspirin, ACEi, BB and statin.  #) T2DM: Home oral hypoglycemics were held and SSI, ACHS was used here.   Discharge Diagnoses:  Principal Problem:   SVT (supraventricular tachycardia) (HCC) Active Problems:   Essential hypertension   COLD (chronic obstructive lung disease) (HCC)   Obstructive sleep apnea on CPAP   Controlled type 2 diabetes mellitus without complication, without long-term current use of insulin (HCC)   Meningioma (HCC)    Nontraumatic intraventricular intracerebral hemorrhage (HCC)   PRES (posterior reversible encephalopathy syndrome)   Pressure injury of skin    Discharge Instructions  Discharge Instructions    Ambulatory referral to Cardiology   Complete by:  As directed    2 weeks with Dr. Lanier Prude   Call MD for:  difficulty breathing, headache or visual disturbances   Complete by:  As directed    Call MD for:  hives   Complete by:  As directed    Call MD for:  persistant dizziness or light-headedness   Complete by:  As directed    Call MD for:  persistant nausea and vomiting   Complete by:  As directed    Call MD for:  redness, tenderness, or signs of infection (pain, swelling, redness, odor or green/yellow discharge around incision site)   Complete by:  As directed    Call MD for:  severe uncontrolled pain   Complete by:  As directed    Call MD for:  temperature >100.4   Complete by:  As directed    Diet - low sodium heart healthy   Complete by:  As directed    Discharge instructions   Complete by:  As directed    Please f/u with your Cardiologist in 2 weeks.   Increase activity slowly   Complete by:  As directed      Allergies as of 09/04/2018   No Known Allergies     Medication List    TAKE these medications   amiodarone 200 MG tablet Commonly known as:  PACERONE Take 1 tablet (200 mg total) by mouth daily for 30 days.  amLODipine 2.5 MG tablet Commonly known as:  NORVASC Take 1 tablet (2.5 mg total) by mouth daily for 30 days. Start taking on:  September 05, 2018 What changed:    medication strength  how much to take   aspirin 81 MG tablet Take 81 mg by mouth daily.   atorvastatin 40 MG tablet Commonly known as:  LIPITOR Take 1 tablet (40 mg total) by mouth daily.   CENTRUM SILVER PO Take 1 tablet by mouth daily.   Co Q10 200 MG Caps Take 200 mg by mouth daily.   docusate sodium 100 MG capsule Commonly known as:  COLACE Take 100 mg by mouth 2 (two) times  daily.   enoxaparin 40 MG/0.4ML injection Commonly known as:  LOVENOX Inject 40 mg into the skin daily.   Fish Oil 1200 MG Caps Take 1,200 mg by mouth daily.   FLUZONE HIGH-DOSE 0.5 ML injection Generic drug:  Influenza vac split quadrivalent PF Inject 0.5 mLs into the muscle once.   glipiZIDE 5 MG tablet Commonly known as:  GLUCOTROL Take 1 tablet (5 mg total) by mouth 2 (two) times daily before a meal.   glucose blood test strip Use as instructed   lisinopril-hydrochlorothiazide 20-12.5 MG tablet Commonly known as:  ZESTORETIC Take 1 tablet by mouth 2 (two) times daily.   metFORMIN 750 MG 24 hr tablet Commonly known as:  GLUCOPHAGE-XR Take 2 tablets (1,500 mg total) by mouth daily with breakfast.   metoprolol succinate 25 MG 24 hr tablet Commonly known as:  TOPROL-XL Take 1 tablet (25 mg total) by mouth 2 (two) times daily with a meal for 30 days. What changed:    medication strength  how much to take  when to take this  additional instructions   OLANZapine zydis 5 MG disintegrating tablet Commonly known as:  ZYPREXA Take 5 mg by mouth every 12 (twelve) hours as needed (agitation).   omeprazole 40 MG capsule Commonly known as:  PRILOSEC Take 1 capsule (40 mg total) by mouth daily.   QUEtiapine 25 MG tablet Commonly known as:  SEROQUEL Take 12.5 mg by mouth daily.   QUEtiapine 50 MG tablet Commonly known as:  SEROQUEL Take 50 mg by mouth at bedtime.   sitaGLIPtin 100 MG tablet Commonly known as:  JANUVIA Take 1 tablet (100 mg total) by mouth daily.   Vitamin D (Cholecalciferol) 25 MCG (1000 UT) Tabs Take 1,000 Units by mouth daily.       No Known Allergies  Consultations:  Cardiology  Neurology   Procedures/Studies: Ct Head W & Wo Contrast  Addendum Date: 08/30/2018   ADDENDUM REPORT: 08/30/2018 22:35 ADDENDUM: In addition to the initially described findings, note made of a 2.2 cm mass within the superficial aspect of the left parotid  gland, incompletely visualized. Nonemergent outpatient ENT referral for further workup and evaluation suggested for further evaluation. Electronically Signed   By: Jeannine Boga M.D.   On: 08/30/2018 22:35   Result Date: 08/30/2018 CLINICAL DATA:  Follow-up postsurgical evaluation status post meningioma resection. EXAM: CT HEAD WITHOUT AND WITH CONTRAST TECHNIQUE: Contiguous axial images were obtained from the base of the skull through the vertex without and with intravenous contrast CONTRAST:  50mL OMNIPAQUE IOHEXOL 300 MG/ML  SOLN COMPARISON:  Prior preoperative CT and MRI from 07/21/2018 FINDINGS: Brain: Examination degraded by motion artifact. Postoperative changes from prior parieto-occipital craniotomy for meningioma resection seen. Dural thickening and enhancement subjacent to the craniotomy bone flap most consistent with postoperative changes. Trace residual  extra-axial/subdural collection measuring up to 2 mm seen just superficial to the dura. No significant mass effect. Previously seen meningioma positioned at the cerebral aqueduct has been resected. Probable small amount of residual blood products within the resection cavity. Hypodensity within the adjacent right greater than left splenium could reflect residual edema and/or Peri section ischemic change (series 3, image 20). The adjacent vein of Galen and internal cerebral veins deviated to the left, similar to previous. Suspected small amount of residual tumor adjacent to the vein of Galen, although difficult to discern by CT as any potential residual tumor and adjacent vein enhanced to similar attenuation. Small amount of degenerating blood products seen layering within the occipital horn of the right lateral ventricle. Trace residual pneumocephalus at the right frontal horn. Overall ventricular size perhaps slightly decreased from preoperative exam. No significant midline shift. Basilar cisterns remain patent. Underlying age-related cerebral  atrophy with chronic microvascular ischemic disease again noted. Remote left basal ganglia lacunar infarct unchanged. Mineralization/calcification at the lateral right thalamus unchanged. No other acute large vessel territory infarct. Vascular: Calcified atherosclerosis at the skull base. Normal intravascular enhancement seen within the intracranial circulation following contrast administration. Skull: Post craniotomy changes noted posteriorly. Right frontal burr hole from previous CBD noted. Scalp soft tissues demonstrate no acute finding. Sinuses/Orbits: Globes and orbital soft tissues within normal limits. Paranasal sinuses are largely clear. No mastoid effusion. Other: None. IMPRESSION: 1. Postoperative changes from interval posterior craniotomy for meningioma resection. Probable small amount of residual blood products at the resection cavity. Parenchymal hypodensity within the adjacent splenium could reflect residual edema and/or peri resection ischemic changes. No obvious gross residual tumor, although possible small amount of residual tumor along the adjacent vein of Galen not excluded by CT. 2. Sequelae of prior right frontal EVD with trace residual pneumocephalus and small amount of residual intraventricular hemorrhage. Overall ventricular size perhaps slightly decreased from preoperative exam. 3. No other new acute intracranial abnormality. Underlying chronic microvascular ischemic changes with remote left basal ganglia lacunar infarct unchanged. Electronically Signed: By: Jeannine Boga M.D. On: 08/30/2018 22:18   Mr Jeri Cos JO Contrast  Result Date: 09/02/2018 CLINICAL DATA:  78 y/o  M; status post meningioma resection. EXAM: MRI HEAD WITHOUT AND WITH CONTRAST TECHNIQUE: Multiplanar, multiecho pulse sequences of the brain and surrounding structures were obtained without and with intravenous contrast. CONTRAST:  8.5 cc Gadavist. COMPARISON:  08/30/2018 CT head.  07/21/2018 MRI head. FINDINGS:  Brain: Resection cavity superior to the cerebellum and within the splenium of corpus callosum contains T1 hyperintense blood products with susceptibility blooming and smooth thin linear enhancement at the margins compatible postoperative changes. The inferomedial cavity margin there is a nodular focus of enhancement measuring 14 x 11 x 10 mm with similar signal characteristics to the meningioma prior MRI compatible with residual tumor (series 11, image 22 and series 14, image 13). Residual tumor is associated with the vein of Galen as well as the origins of internal cerebral veins. Within the splenium of corpus callosum and right forceps of corpus callosum there is reduced diffusion compatible with ischemia. Stable ventricle size. Trace volume of hemorrhage is present within the occipital horn of the right lateral ventricle. There is a thin extradural postop fluid collection subjacent to parieto-occipital craniotomy without associated mass effect. There is an additional thin 4 mm subdural collection over the left parietal lobe. Trace volume of hemorrhage along the tentorium/posterior falx. No mass effect or herniation. Small chronic lacunar infarct within the left basal ganglia with  hemosiderin staining. Stable punctate nonspecific T2 FLAIR hyperintensities in subcortical and periventricular white matter are compatible with mild chronic microvascular ischemic changes. Stable mild volume loss of the brain. Vascular: Normal flow voids. Skull and upper cervical spine: Normal marrow signal. Sinuses/Orbits: Negative. Other: None. IMPRESSION: 1. Postoperative changes related to parieto-occipital craniotomy and resection of meningioma. Small volume of associated intracranial hemorrhage without mass effect. 2. 14 mm focus of residual nodular enhancement at the inferomedial cavity margin compatible with residual tumor. 3. Acute ischemia with splenium in right forceps of splenium of corpus callosum. 4. Stable lateral and  third ventricle size. No new focus of mass effect. No herniation. Electronically Signed   By: Kristine Garbe M.D.   On: 09/02/2018 13:42   Dg Chest Port 1 View  Result Date: 08/28/2018 CLINICAL DATA:  Cough. EXAM: PORTABLE CHEST 1 VIEW COMPARISON:  07/21/2018 FINDINGS: The heart is within normal limits in size and stable. There is tortuosity and calcification of the thoracic aorta. Chronic bronchitic lung changes but no definite infiltrates, effusions or edema. The bony structures are intact. Stable degenerative changes involving both shoulders. IMPRESSION: No acute cardiopulmonary findings. Electronically Signed   By: Marijo Sanes M.D.   On: 08/28/2018 19:42   Dg Swallowing Func-speech Pathology  Result Date: 09/01/2018 Objective Swallowing Evaluation: Type of Study: MBS-Modified Barium Swallow Study  Patient Details Name: Jason Farrell. MRN: [id number] Date of Birth: May 13, 1941 Today's Date: 09/01/2018 Time: SLP Start Time (ACUTE ONLY): [id number] -SLP Stop Time (ACUTE ONLY): 0902 SLP Time Calculation (min) (ACUTE ONLY): 16 min Past Medical History: Past Medical History: Diagnosis Date . Coronary artery disease  . Diabetes mellitus without complication ([place])  . Dyslipidemia  . GERD (gastroesophageal reflux disease)  . Hyperlipidemia  . Hypertension  . MI (myocardial infarction) ([place])  . Osteoarthritis  Past Surgical History: Past Surgical History: Procedure Laterality Date . CARDIAC CATHETERIZATION  2006  stent placement . COLONOSCOPY   . CORONARY ANGIOPLASTY WITH STENT PLACEMENT  2006  stent x 2  . CORONARY ANGIOPLASTY WITH STENT PLACEMENT  1996  stent x 3  . FOOT SURGERY Right  . HALLUX VALGUS CHEILECTOMY Right 02/25/2015  Procedure: HALLUX VALGUS CHEILECTOMY;  Surgeon: [name], DPM;  Location: [facility] ORS;  Service: Podiatry;  Laterality: Right; HPI: Patient is a 78 year old male with history ofCAD, T2DM, HTN, OSA--compliant with CPAP who was evaluated in the ED01/06/20with3-4 week  history of intermittent bouts of confusionwith lability,headaches, difficulty speaking and visual changes. 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 He was referred to [facility] for evaluation and underwent Craniotomy withresection of pineal region massand placement of EVDon 08/11/2017 by Dr. [name]. Post procedure treated with decadron and IV Keppra X 7 days. Drain removed with 1/30 and on 1/31 he developed decrease in LOC requiring transfer to ICU. 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 MRI brain showed small amount of hemorrhage at resection site with focal hemorrhage/clot within right ventricle and unchanged appearance of increased T2/Flair signal. CTA head/neck done reveling narrow/diminshed flow in right V4 segment question due to dissection, thrombus or vasospasm. Patient also with resultant confusion with agitation and loss of bilateral vision. Neurology felt that patient'sMS changes were felt to bedue to delirium and did not feel seizures were cause -- d/c Keppra after 7 days. Repeat CT head showed no change in small left parietal SDH with moderate R>L ventriculomegaly  and no significant interval change. Ophthalmology (Dr. 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   Plans were to admit patient to CIR on 02/07 but he developed increase in confusion and lethargy.CBC showed rise in WBC to 30 and LP/BC done for work up. CSF/BC negative.Repeat CT head showedimprovement in intraventricular blood and intraventricular air. Leucocytosis felt to be due to steroids.Seroquel added at nights to help with sleep wake disruption and Zyprexa being used prn agitation during the day. He developed SVT with HR up to 150's on 2/11 and lopressor added with resolution.  On dysphagia 3 diet with question of aspiration.   He continues to be confused, is oriented to self, year and situation.  He has visual deficits and responds to visual threat at times, continues to wax and wane with poor safety awarenss and impulsivity.  He was subsequently transferred from CIR due to SVT.  Patient with CT head showing Postoperative changes from interval posterior craniotomy for  Subjective: pt awake sitting in chair Assessment / Plan / Recommendation CHL IP CLINICAL IMPRESSIONS 09/01/2018 Clinical Impression Pt presents with functional oropharyngeal swallow ability without aspiration of any consistency tested despite taxed to consume sequential boluses.  He did demonstrate minimal laryngeal penetration however.  Overall swallow was strong and timely with only minimal residuals *oropharyngeal* and cued dry swallow helpful to clear.  Recommend pt consume regular/thin diet.  Largest aspiration risk factor will be his respiratory and cognitive status - thus SlP will follow up to educate family/pt and assure tolerance.  Thanks for this consult.  SLP Visit Diagnosis -- Attention and concentration deficit following -- Frontal lobe and executive function deficit following -- Impact on safety and function --   CHL IP TREATMENT RECOMMENDATION 09/01/2018 Treatment Recommendations Therapy as outlined in treatment plan below   Prognosis 09/01/2018 Prognosis for Safe Diet Advancement Good Barriers to Reach Goals -- Barriers/Prognosis Comment -- CHL IP DIET RECOMMENDATION 09/01/2018 SLP Diet Recommendations Regular solids;Thin liquid Liquid Administration via Cup;Straw Medication Administration Whole meds with liquid Compensations Slow rate;Small sips/bites Postural Changes Remain semi-upright after after feeds/meals (Comment);Seated upright at 90 degrees   CHL IP OTHER RECOMMENDATIONS 09/01/2018 Recommended Consults -- Oral Care Recommendations Oral care BID Other Recommendations --   CHL IP FOLLOW UP  RECOMMENDATIONS 08/31/2018 Follow up Recommendations Other (comment)   CHL IP FREQUENCY AND DURATION 09/01/2018 Speech Therapy Frequency (ACUTE ONLY) min 1 x/week Treatment Duration 1 week      CHL IP ORAL PHASE 09/01/2018 Oral Phase WFL Oral - Pudding Teaspoon -- Oral - Pudding Cup -- Oral - Honey Teaspoon -- Oral - Honey Cup -- Oral - Nectar Teaspoon WFL Oral - Nectar Cup WFL Oral - Nectar Straw -- Oral - Thin Teaspoon WFL Oral - Thin Cup WFL Oral - Thin Straw WFL Oral - Puree WFL Oral - Mech Soft WFL Oral - Regular -- Oral - Multi-Consistency -- Oral - Pill WFL Oral Phase - Comment --  CHL IP PHARYNGEAL PHASE 09/01/2018 Pharyngeal Phase Impaired Pharyngeal- Pudding Teaspoon -- Pharyngeal -- Pharyngeal- Pudding Cup -- Pharyngeal -- Pharyngeal- Honey Teaspoon -- Pharyngeal -- Pharyngeal- Honey Cup -- Pharyngeal -- Pharyngeal- Nectar Teaspoon WFL Pharyngeal -- Pharyngeal- Nectar Cup WFL Pharyngeal -- Pharyngeal- Nectar Straw -- Pharyngeal -- Pharyngeal- Thin Teaspoon WFL;Penetration/Aspiration during swallow Pharyngeal -- Pharyngeal- Thin Cup [facility];Penetration/Aspiration during swallow Pharyngeal Material enters airway, remains ABOVE vocal cords then ejected out Pharyngeal- Thin Straw WFL;Penetration/Aspiration during  swallow Pharyngeal Material enters airway, remains ABOVE vocal cords then ejected out Pharyngeal- Puree WFL Pharyngeal -- Pharyngeal- Mechanical Soft WFL Pharyngeal -- Pharyngeal- Regular -- Pharyngeal -- Pharyngeal- Multi-consistency -- Pharyngeal -- Pharyngeal- Pill WFL Pharyngeal -- Pharyngeal Comment minimal pharyngeal residuals present that pt did not sense but cued dry swallows helpful to decrease, trace laryngeal penetration of lqiuids - no aspiration noted despite sequential boluses provided  CHL IP CERVICAL ESOPHAGEAL PHASE 09/01/2018 Cervical Esophageal Phase WFL Pudding Teaspoon -- Pudding Cup -- Honey Teaspoon -- Honey Cup -- Nectar Teaspoon -- Nectar Cup -- Nectar Straw -- Thin Teaspoon -- Thin  Cup -- Thin Straw -- Puree -- Mechanical Soft -- Regular -- Multi-consistency -- Pill -- Cervical Esophageal Comment -- Macario Golds 09/01/2018, 2:10 PM  Luanna Salk, MS [facility] SLP Acute Rehab Services Pager [phone] Office [phone]              Echo 08/30/2018:1. The left ventricle has a visually estimated ejection fraction of of 55%. The cavity size was normal. There is moderately increased left ventricular wall thickness. Left ventricular diastolic Doppler parameters are consistent with impaired relaxation  No evidence of left ventricular regional wall motion abnormalities. 2. The right ventricle has normal systolic function. The cavity was normal. There is no increase in right ventricular wall thickness. 3. Trivial pericardial effusion. 4. The mitral valve is normal in structure. No evidence of mitral valve stenosis. Mild regurgitation. 5. The tricuspid valve is normal in structure. 6. The aortic valve is tricuspid Aortic valve regurgitation is trivial by color flow Doppler. no stenosis of the aortic valve. 7. The aortic root and ascending aorta are normal in size and structure. 8. The inferior vena cava was normal in size with <50% respiratory variability. 9. No evidence of left ventricular regional wall motion abnormalities. 10. Right atrial pressure is estimated at 8 mmHg. 11. No complete TR doppler jet so unable to estimate PA systolic pressure.   EEG 09/01/2018:IMPRESSION: This is a normal awake and drowsy electroencephalogram.No epileptiform activity is noted.   Subjective:   Discharge Exam: Vitals:   09/04/18 0846 09/04/18 1317  BP: 120/67 122/71  Pulse: (!) 101 88  Resp:    Temp:    SpO2:  100%   Vitals:   09/04/18 0830 09/04/18 0846 09/04/18 0919 09/04/18 1317  BP: 120/67 120/67  122/71  Pulse: (!) 101 (!) 101  88  Resp: 18     Temp:      TempSrc:      SpO2: 98%   100%  Weight:   86 kg   Height:   5\' 6"  (1.676 m)    General exam: No  acute distress Respiratory system: No increased work of breathing, diminished lung sounds at bases, no wheezes, crackles, rhonchi Cardiovascular system: Regular rate and rhythm, no murmurs Gastrointestinal system: Soft, nondistended, no rebound or guarding, plus bowel sounds Central nervous system: Alert but not oriented, occasionally follows commands, no notable weakness that is focal Extremities: 1+ lower extremity edema Skin: No rashes over visible skin Psychiatry: Unable to assess due to medical condition      The results of significant diagnostics from this hospitalization (including imaging, microbiology, ancillary and laboratory) are listed below for reference.     Microbiology: No results found for this or any previous visit (from the past 240 hour(s)).   Labs: BNP (last 3 results) No results for input(s): BNP in the last 8760 hours. Basic Metabolic Panel: Recent Labs  Lab 08/31/18 0546 09/01/18 0745  09/02/18 0450 09/03/18 0329 09/04/18 0424  NA 140 140 138 140 138  K 3.5 3.2* 3.5 4.1 3.2*  CL 107 106 103 101 100  CO2 25 26 28 30 24   GLUCOSE 165* 167* 172* 188* 186*  BUN 12 8 5* <5* 12  CREATININE 0.77 0.71 0.75 0.80 0.89  CALCIUM 8.4* 8.2* 8.4* 8.7* 8.6*  MG  --  1.7 1.9 1.7 1.7   Liver Function Tests: Recent Labs  Lab 08/29/18 0659 09/01/18 0745 09/02/18 0450 09/03/18 0329  AST 21 17 15  14*  ALT 34 34 31 30  ALKPHOS 68 75 76 83  BILITOT 0.7 0.6 0.5 0.8  PROT 5.4* 5.3* 5.1* 5.9*  ALBUMIN 2.4* 2.2* 2.1* 2.3*   No results for input(s): LIPASE, AMYLASE in the last 168 hours. No results for input(s): AMMONIA in the last 168 hours. CBC: Recent Labs  Lab 08/29/18 0659 08/30/18 0935 08/31/18 0546 09/04/18 0424  WBC 11.0* 10.5 9.3 7.3  NEUTROABS 9.0*  --   --   --   HGB 12.0* 12.7* 11.3* 12.3*  HCT 36.1* 39.6 36.3* 37.8*  MCV 88.9 89.8 89.4 89.8  PLT 298 343 291 308   Cardiac Enzymes: Recent Labs  Lab 08/30/18 0935  CKTOTAL 29*  CKMB 1.4   TROPONINI <0.03   BNP: Invalid input(s): POCBNP CBG: Recent Labs  Lab 09/03/18 1201 09/03/18 1636 09/03/18 2208 09/04/18 0740 09/04/18 1134  GLUCAP 243* 125* 144* 167* 244*   D-Dimer No results for input(s): DDIMER in the last 72 hours. Hgb A1c No results for input(s): HGBA1C in the last 72 hours. Lipid Profile No results for input(s): CHOL, HDL, LDLCALC, TRIG, CHOLHDL, LDLDIRECT in the last 72 hours. Thyroid function studies No results for input(s): TSH, T4TOTAL, T3FREE, THYROIDAB in the last 72 hours.  Invalid input(s): FREET3 Anemia work up No results for input(s): VITAMINB12, FOLATE, FERRITIN, TIBC, IRON, RETICCTPCT in the last 72 hours. Urinalysis    Component Value Date/Time   COLORURINE YELLOW (A) 07/21/2018 1526   APPEARANCEUR CLEAR (A) 07/21/2018 1526   LABSPEC 1.019 07/21/2018 1526   PHURINE 5.0 07/21/2018 1526   GLUCOSEU NEGATIVE 07/21/2018 1526   HGBUR NEGATIVE 07/21/2018 1526   BILIRUBINUR NEGATIVE 07/21/2018 1526   BILIRUBINUR negative 07/21/2018 1314   KETONESUR NEGATIVE 07/21/2018 1526   PROTEINUR NEGATIVE 07/21/2018 1526   UROBILINOGEN 0.2 07/21/2018 1314   NITRITE NEGATIVE 07/21/2018 [place] 07/21/2018 1526   Sepsis Labs Invalid input(s): PROCALCITONIN,  WBC,  LACTICIDVEN Microbiology No results found for this or any previous visit (from the past 240 hour(s)).   Time coordinating discharge:35  SIGNED:   Cristy Folks, MD  [facility] 09/04/2018, 3:14 PM   If 7PM-7AM, please contact night-coverage [email or web address] Password [email or web address]

## 2018-09-04 NOTE — Progress Notes (Signed)
Physical Therapy Treatment Patient Details Name: Jason Farrell. MRN: [id number] DOB: 1940/08/10 Today's Date: 09/04/2018    History of Present Illness Pt is a 78 y.o. male admitted from [place] on 08/30/18 due to SVT. 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 Other PMH includes CAD, HTN, OSA on CPAP, DM.   PT Comments    Pt progressing with mobility. Pt with very unsteady gait pattern, requiring HHA and modA to maintain balance. Pt with poor postural reactions/balance strategies, multiple bouts of balance loss with minimal challenge requiring modA to prevent LOB; visual deficits persist, although seem improved since yesterday. Pt unable to follow multistep directions, also with decreased attention and problem solving. Pt remains motivated to participate. Plan for return to CIR today.    Follow Up Recommendations  CIR;Supervision for mobility/OOB     Equipment Recommendations  (TBD)    Recommendations for Other Services       Precautions / Restrictions Precautions Precautions: Fall Precaution Comments: diplopia, use taped glasses Restrictions Weight Bearing Restrictions: No    Mobility  Bed Mobility Overal bed mobility: Needs Assistance Bed Mobility: Supine to Sit     Supine to sit: Min assist     General bed mobility comments: MinA to assist trunk elevation and scoot hips to EOB  Transfers Overall transfer level: Needs assistance Equipment used: 2 person hand held assist;Rolling walker (2 wheeled) Transfers: Sit to/from Stand Sit to Stand: Min assist;Min guard         General transfer comment: MinA to stand from bed requiring bilat HHA to maintain standing balance. Performed 5x sit<>stand with minA, progressing to close min guard; LOB with minimal challenge requiring assist to correct  Ambulation/Gait Ambulation/Gait assistance: Mod assist Gait Distance (Feet): 60 Feet Assistive device: 2  person hand held assist;Rolling walker (2 wheeled) Gait Pattern/deviations: Step-to pattern;Decreased weight shift to left;Decreased dorsiflexion - right;Shuffle;Leaning posteriorly Gait velocity: Decreased Gait velocity interpretation: <1.31 ft/sec, indicative of household ambulator General Gait Details: Very unsteady gait, requiring bilat HHA and modA to maintain balance; short, shuffling steps, pt turning inappropriately requiring frequent cues to direction. Additional amb with RW and min-modA, pt running into objects, difficulty multi-tasking and requiring frequent cues for safety; 3x LOB requiring assist to correct   Stairs             Wheelchair Mobility    Modified Rankin (Stroke Patients Only)       Balance Overall balance assessment: Needs assistance   Sitting balance-Leahy Scale: Fair       Standing balance-Leahy Scale: Poor Standing balance comment: MinA to maintain standing balance; LOB with minimal challenge requiring assist to correct                            Cognition Arousal/Alertness: Awake/alert Behavior During Therapy: Flat affect Overall Cognitive Status: Impaired/Different from baseline Area of Impairment: Orientation;Attention;Memory;Following commands;Safety/judgement;Awareness;Problem solving                 Orientation Level: Disoriented to;Place;Situation Current Attention Level: Sustained Memory: Decreased short-term memory Following Commands: Follows one step commands consistently;Follows multi-step commands inconsistently Safety/Judgement: Decreased awareness of safety;Decreased awareness of deficits Awareness: Intellectual Problem Solving: Slow processing;Decreased initiation;Difficulty sequencing;Requires verbal cues        Exercises      General Comments        Pertinent Vitals/Pain Pain Assessment: Faces Faces Pain Scale: Hurts even more Pain Location: back  Pain Descriptors / Indicators:  Grimacing;Discomfort Pain Intervention(s): Limited activity within patient's tolerance;Repositioned    Home Living                      Prior Function            PT Goals (current goals can now be found in the care plan section) Acute Rehab PT Goals Patient Stated Goal: Return to CIR for continued rehab PT Goal Formulation: With patient/family Time For Goal Achievement: 09/17/18 Potential to Achieve Goals: Good Progress towards PT goals: Progressing toward goals    Frequency    Min 3X/week      PT Plan Current plan remains appropriate    Co-evaluation PT/OT/SLP Co-Evaluation/Treatment: Yes Reason for Co-Treatment: Necessary to address cognition/behavior during functional activity;For patient/therapist safety;To address functional/ADL transfers PT goals addressed during session: Mobility/safety with mobility;Balance OT goals addressed during session: ADL's and self-care      AM-PAC PT "6 Clicks" Mobility   Outcome Measure    Help needed moving from lying on your back to sitting on the side of a flat bed without using bedrails?: A Lot Help needed moving to and from a bed to a chair (including a wheelchair)?: A Little Help needed standing up from a chair using your arms (e.g., wheelchair or bedside chair)?: A Lot Help needed to walk in hospital room?: A Lot Help needed climbing 3-5 steps with a railing? : Total 6 Click Score: 10    End of Session Equipment Utilized During Treatment: Gait belt Activity Tolerance: Patient tolerated treatment well Patient left: in chair;with call bell/phone within reach;with chair alarm set Nurse Communication: Mobility status PT Visit Diagnosis: Other abnormalities of gait and mobility (R26.89);Muscle weakness (generalized) (M62.81)     Time: 2637-8588 PT Time Calculation (min) (ACUTE ONLY): 29 min  Charges:  $Gait Training: 8-22 mins                    Mabeline Caras, PT, DPT Acute Rehabilitation Services  Pager  [phone] Office [place] 09/04/2018, 3:57 PM

## 2018-09-04 NOTE — Discharge Instructions (Signed)
Supraventricular Tachycardia, Adult  Supraventricular tachycardia (SVT) is a kind of abnormal heartbeat. It makes your heart beat very fast and then beat at a normal speed.  A normal resting heartbeat is 60-100 times a minute. This condition can make your heart beat more than 150 times a minute. Times of having a fast heartbeat (episodes) can be scary, but they are usually not dangerous. However, in some cases, they can lead to heart failure if:  · They happen several times per day.  · Last for longer than a few seconds.  What are the causes?  Usually, a normal heartbeat starts when an area called the sinoatrial node releases an electrical signal. In SVT, other areas of the heart send out electrical signals that interfere with the signal from the sinoatrial node.  What increases the risk?  · Being 12?76 years old.  · Being a woman.  The following factors may make you more likely to develop this condition:  · Stress.  · Tiredness.  · Smoking.  · Stimulant drugs, such as cocaine and methamphetamine  · Alcohol.  · Caffeine.  · Pregnancy.  · Anxiety.  What are the signs or symptoms?  · A pounding heart.  ? A feeling that your heart is skipping beats (palpitations).  ? Weakness.  ? Trouble getting enough air.  ? Pain or tightness in your chest.  ? Feeling like you are going to pass out.  ? Feeling worried or nervous.  ? Dizziness.  ? Sweating.  ? Feeling like you might throw up.  ? Passing out (fainting).  ? Tiredness.  Sometimes, there are no symptoms.  How is this treated?  · Vagal nerve stimulation. Ways to do this:  ? Holding your breath and pushing, as though you are pooping.  ? Massaging an area on one side of your neck. Do not try this yourself. Only a doctor should do this. If done the wrong way, it can lead to a stroke.  ? Bending forward with your head between your legs.  ? Coughing while bending forward with your head between your legs.  ? Closing your eyes and massaging your eyeballs. Ask a doctor how to do  this.  · Medicines that prevent attacks.  · Medicine to stop an attack given through an IV at the hospital.  · A small electric shock (cardioversion) that stops an attack.  · Radiofrequency ablation. In this procedure, a small, thin tube (catheter) is used to send radiofrequency energy to the area that is causing the rapid heartbeats.  Follow these instructions at home:  Stress    · Avoid things that make you feel stressed.  · To deal with stress, try:  ? Doing yoga, meditation, or being out in nature.  ? Listening to relaxing music.  ? Doing deep breathing.  ? Taking steps to be healthy, such as getting lots of sleep, exercising, and eating a balanced diet.  ? Talking with a mental health doctor.  Sleep  · Try to get at least 7 hours of sleep each night.  Tobacco and nicotine    · Do not use any products that contain nicotine or tobacco, such as cigarettes, e-cigarettes, and chewing tobacco. If you need help quitting, ask your doctor.  Alcohol  · If alcohol gives you a fast heartbeat, do not drink alcohol.  · Even in alcohol does not seem to give you a fast heartbeat, limit alcohol use,.  ? For nonpregnant women, this means no more than 1   drink a day.  ? For men, this means no more than 2 drinks a day.  § "One drink" means one of these:  § 12 oz of beer (355 mL).  § 5 oz of wine (148 mL)  § 1½ oz of hard liquor (44 mL).  Caffeine  · If caffeine gives you a fast heartbeat, do not eat, drink, or use anything with caffeine in it.  · Even if caffeine does not seem to give you a fast heartbeat, limit how much caffeine you eat, drink, or use.  Stimulant drugs  · Do not use drugs such as cocaine or methamphetamine. If you need help quitting, ask your doctor.  General instructions  · Stay at a healthy weight.  · Exercise regularly. Ask your doctor about good activities for you. Try one of these:  ? 150 minutes a week of gentle exercise, like walking or yoga.  ? 75 minutes a week of exercise that is very active, like  running or swimming.  · Try a combination of gentle exercise and very active exercise.  · Do home treatments to slow down your heartbeat.  · Take over-the-counter and prescription medicines only as told by your doctor.  Contact a doctor if:  · You have a fast heartbeat more often.  · Times of having a fast heartbeat last longer than before.  · Home treatments to slow down your heartbeat do not help.  · You have new symptoms.  Get help right away if:  · You have chest pain.  · Your symptoms get worse.  · You have trouble breathing.  · Your heart beats very fast for more than 20 minutes.  · You pass out.  These symptoms may be an emergency. Do not wait to see if the symptoms will go away. Get medical help right away. Call your local emergency services (911 in the [place] Do not drive yourself to the hospital.  Summary  · SVT is a type of abnormal heart beat.  · During an episode, our heart rate may be higher than 150 beats per minute  · Treatment depends on how often the condition happens and your symptoms.  This information is not intended to replace advice given to you by your health care provider. Make sure you discuss any questions you have with your health care provider.  Document Released: 07/02/2005 Document Revised: 03/08/2016 Document Reviewed: 03/08/2016  Elsevier Interactive Patient Education © 2019 [employer]

## 2018-09-04 NOTE — Progress Notes (Signed)
Occupational Therapy Treatment Patient Details Name: Jason Farrell. MRN: [id number] DOB: [birth year] Today's Date: 09/04/2018    History of present illness Pt is a 78 y.o. male admitted from [place] on 08/30/18 due to SVT. 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 Other PMH includes CAD, HTN, OSA on CPAP, DM.   OT comments  Pt + for diplopia. R eye dominant, taped nasal portion of L lens of glasses with reported resolving of symptoms. Pt completed seated grooming with set up. Continues to be highly unsteady with ambulation and require min to max assist for ADL.   Follow Up Recommendations  CIR    Equipment Recommendations       Recommendations for Other Services      Precautions / Restrictions Precautions Precautions: Fall Precaution Comments: diplopia, use taped glasses       Mobility Bed Mobility               General bed mobility comments: pt seated at EOB upon arrival  Transfers Overall transfer level: Needs assistance Equipment used: Rolling walker (2 wheeled);2 person hand held assist Transfers: Sit to/from Stand Sit to Stand: Min assist;Min guard;+2 safety/equipment         General transfer comment: min assist from bed, min guard from recliner    Balance Overall balance assessment: Needs assistance   Sitting balance-Leahy Scale: Fair       Standing balance-Leahy Scale: Poor                             ADL either performed or assessed with clinical judgement   ADL Overall ADL's : Needs assistance/impaired     Grooming: Brushing hair;Sitting;Set up           Upper Body Dressing : Maximal assistance;Sitting                   Functional mobility during ADLs: +2 for physical assistance;Moderate assistance;Minimal assistance General ADL Comments: Pt reporting diplopia, taped pt's glasses with resolving of symptoms.     Vision   Depth Perception:  Overshoots;Undershoots   Perception     Praxis      Cognition Arousal/Alertness: Awake/alert Behavior During Therapy: Flat affect Overall Cognitive Status: Impaired/Different from baseline Area of Impairment: Orientation;Attention;Memory;Following commands;Safety/judgement;Awareness;Problem solving                 Orientation Level: Disoriented to;Place Current Attention Level: Sustained Memory: Decreased short-term memory Following Commands: Follows one step commands consistently;Follows multi-step commands inconsistently Safety/Judgement: Decreased awareness of safety;Decreased awareness of deficits Awareness: Intellectual Problem Solving: Slow processing;Decreased initiation;Difficulty sequencing;Requires verbal cues          Exercises     Shoulder Instructions       General Comments      Pertinent Vitals/ Pain       Pain Assessment: Faces Faces Pain Scale: Hurts even more Pain Location: back Pain Descriptors / Indicators: Grimacing;Discomfort Pain Intervention(s): Repositioned  Home Living                                          Prior Functioning/Environment              Frequency  Min 3X/week        Progress Toward Goals  OT Goals(current goals can now be  found in the care plan section)  Progress towards OT goals: Progressing toward goals  Acute Rehab OT Goals Patient Stated Goal: Return to CIR for continued rehab OT Goal Formulation: With patient/family Time For Goal Achievement: 09/17/18 Potential to Achieve Goals: Good  Plan Discharge plan remains appropriate    Co-evaluation    PT/OT/SLP Co-Evaluation/Treatment: Yes Reason for Co-Treatment: For patient/therapist safety;Necessary to address cognition/behavior during functional activity   OT goals addressed during session: ADL's and self-care      AM-PAC OT "6 Clicks" Daily Activity     Outcome Measure   Help from another person eating meals?: A  Little Help from another person taking care of personal grooming?: A Little Help from another person toileting, which includes using toliet, bedpan, or urinal?: A Lot Help from another person bathing (including washing, rinsing, drying)?: A Lot Help from another person to put on and taking off regular upper body clothing?: A Lot Help from another person to put on and taking off regular lower body clothing?: A Lot 6 Click Score: 14    End of Session Equipment Utilized During Treatment: Rolling walker;Gait belt  OT Visit Diagnosis: Unsteadiness on feet (R26.81);Other abnormalities of gait and mobility (R26.89);Muscle weakness (generalized) (M62.81);Low vision, both eyes (H54.2);Other symptoms and signs involving cognitive function;Pain   Activity Tolerance Other (comment)(pt with Sp02 down to 80% on RA with ambulation)   Patient Left in chair;with call bell/phone within reach;with chair alarm set   Nurse Communication          Time: 6378-5885 OT Time Calculation (min): 31 min  Charges: OT General Charges $OT Visit: 1 Visit OT Treatments $Self Care/Home Management : 8-22 mins  Nestor Lewandowsky, OTR/L Acute Rehabilitation Services Pager: [phone] Office: [phone]  Malka So 09/04/2018, 3:48 PM

## 2018-09-04 NOTE — Progress Notes (Signed)
Review of tele no recurrent SVT Would switch to 200 mg amiodarone daily    Will make sure that he has f/u in clinic  In 2 wks    Will make sure he has f/u in [place]  (T [place])  Dorris Carnes MD

## 2018-09-04 NOTE — Consult Note (Signed)
   [facility] Inpatient Consult   09/04/2018  Jason Farrell. 1941-04-17 [id number]  [facility] Liaison Follow up:  Patient screened for potential [facility] Care Management services due to unplanned readmission risk score of 25% and 2 hospitalizations within past 6 months.  Per chart review, disposition is for CIR. No [facility] identifiable needs at this time.  Netta Cedars, MSN, [facility] Liaison Nurse Mobile Phone [phone]  Toll free office [phone]

## 2018-09-04 NOTE — PMR Pre-admission (Signed)
PMR Admission Coordinator Pre-Admission Assessment  Patient: Jason Farrell. is an 78 y.o., male MRN: [id number] DOB: 09/16/1940 Height: _0  (167.6 cm) Weight: 86 kg  Insurance Information HMO:     PPO:      PCP:      IPA:      80/20:      OTHER: no HMO PRIMARY: Medicare a and b      Policy#: [id number]      Subscriber: pt Benefits:  Phone #: passport one online     Name: 08/21/2018 Eff. Date: 02/14/2008     Deduct: $1408      Out of Pocket Max: none      Life Max: none CIR: 100%      SNF: 20 full days Outpatient: 80%     Co-Pay: 20% Home Health: 100%      Co-Pay: none DME: 80%     Co-Pay: 20% Providers: pt choice  SECONDARY: [employer]      Policy#: [id number]      Subscriber: pt  Medicaid Application Date:       Case Manager:  Disability Application Date:       Case Worker:   Emergency Contact Information Contact Information    Name Relation Home Work Mobile   Denicola,[name] Spouse   [phone]   buckler,jodie Other   [phone]      Current Medical History  Patient Admitting Diagnosis:  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  MRI brain revealed large meningioma level of cerebral aqueduct with vasogenic edema of posterior medial temporal lobes and obstructive hydrocephalus.  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 [name]. Post procedure treated with decadron and IV Keppra X 7 days.  Drain removed with 1/30 and on 1/31 he developed decrease in LOC requiring transfer to ICU. 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  MRI brain showed small amount of  hemorrhage at resection site with focal hemorrhage/clot within right ventricle and unchanged appearance of increased T2/Flair signal.  CTA head/neck done reveling narrow/diminished flow in right V4 segment question due to dissection, thrombus or vasospasm. He with resultant confusion with agitation and loss of bilateral vision. Neurology felt that patient's MS changes were felt to be due to delirium and did not feel seizures were cause -- d/c Keppra after 7 days.   Repeat CT head showed no change in small left parietal SDH with moderate R>L ventriculomegaly and no significant interval change.  Ophthalmology  (Dr. 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 To monitor over several weeks and to follow up with neuro ophthalmologist in the future.  Left corneal abrasion treated with proparacaine and erythromycin ointment. ABLA being monitored, hypocalcemia/hypokalemia treated with supplementation and leucocytosis felt to be reactive due to steroids. Plans were to admit patient to [facility] on 02/07 but he developed increase in confusion and lethargy. CBC showed rise in WBC to 30 and LP/BC done for work up. CSF/BC negative. Repeat CT head showed improvement in intraventricular blood and intraventricular air. Leucocytosis  felt to be due to steroids.  Seroquel added at nights to help with sleep wake disruption and Zyprexa being used prn agitation during the day. He developed SVT with HR up to 150's on 2/11 and lopressor added with resolution.  On dysphagia 3 diet with question of aspiration --MBS not done due to cognitive deficits as well as body habitus. He continued to be confused,  oriented to self only, has visual deficits and responds to visual threat at times, continues to wax and wane with poor safety awareness and impulsivity.    Patient admitted to [facility] CIR 08/28/2018. Developed increasing confusion, lethargy  and SVT on 09/01/2018. Cardiology tried Lopressor without any improvement and adenosine had been given. Placed on Amiodarone drip and readmitted patient to acute hospital. No recurrent SVT on Tele.  Neurology consulted EEG showed no evidence of epileptiform activity on 2/17.Continue conservative management of delirium. Continue nightly Seroquel.  Cardiology consulted. Patient required 2 doses of adenosine and was loaded with amiodarone. Continue metoprolol. Patient more alert but remains confused. Patient ready for readmission today 09/04/2018.  Patient's medical record from [facility] has been reviewed by the rehabilitation admission coordinator and physician.   Past Medical History  Past Medical History:  Diagnosis Date  . Coronary artery disease   . Diabetes mellitus without complication ([place])   . Dyslipidemia   . GERD (gastroesophageal reflux disease)   . Hyperlipidemia   . Hypertension   . MI (myocardial infarction) ([place])   . Osteoarthritis     Family History   family history includes Alcohol abuse in his father; COPD in his sister; Cancer in his sister; Heart disease in his sister; Hyperlipidemia in his father and sister; Hypertension in his father and sister.  Prior Rehab/Hospitalizations Has the patient had major surgery during 100 days prior to admission? [facility] until 09/01/2018 when patient readmitted to acute hospital    Current Medications  Current Facility-Administered Medications:  .   stroke: mapping our early stages of recovery book, , Does not apply, Once, Roland Rack P, MD .  0.9 %  sodium chloride infusion, , Intravenous, PRN, Damita Lack, MD, Last Rate: 20 mL/hr at 09/02/18 0356 .  acetaminophen (TYLENOL) tablet 650 mg, 650 mg, Oral, Q6H PRN, 650 mg at 09/01/18 2132 **OR** acetaminophen (TYLENOL) suppository 650 mg, 650 mg, Rectal, Q6H PRN, Karmen Bongo, MD .  adenosine (ADENOCARD) 6 MG/2ML injection 12 mg, 12 mg,  Intravenous, Once, Josue Hector, MD .  amiodarone (PACERONE) tablet 200 mg, 200 mg, Oral, BID, Fay Records, MD, 200 mg at 09/03/18 2215 .  amLODipine (NORVASC) tablet 2.5 mg, 2.5 mg, Oral, Daily, Fay Records, MD, 2.5 mg at 09/03/18 0951 .  aspirin chewable tablet 81 mg, 81 mg, Oral, Daily, Karmen Bongo, MD, 81 mg at 09/03/18 0951 .  atorvastatin (LIPITOR) tablet 40 mg, 40 mg, Oral, Daily, Karmen Bongo, MD, 40 mg at 09/03/18 0951 .  docusate sodium (COLACE) capsule 100 mg, 100 mg, Oral, BID, Karmen Bongo, MD, 100 mg at 09/03/18 2215 .  feeding supplement (GLUCERNA SHAKE) (GLUCERNA SHAKE) liquid 237 mL, 237 mL, Oral, BID BM, Amin, Ankit Chirag, MD, 237 mL at 09/03/18 1704 .  lisinopril (PRINIVIL,ZESTRIL) tablet 20 mg, 20 mg, Oral, Daily, 20 mg at 09/03/18 0951 **AND** hydrochlorothiazide (MICROZIDE) capsule 12.5 mg, 12.5 mg, Oral, Daily, Karmen Bongo, MD, 12.5 mg at 09/03/18 0951 .  insulin aspart (novoLOG) injection 0-15 Units, 0-15 Units, Subcutaneous, TID WC, Amin, Jeanella Flattery, MD,  3 Units at 09/04/18 0846 .  insulin aspart (novoLOG) injection 0-5 Units, 0-5 Units, Subcutaneous, QHS, Karmen Bongo, MD .  metoprolol succinate (TOPROL-XL) 24 hr tablet 25 mg, 25 mg, Oral, BID WC, Amin, Ankit Chirag, MD, 25 mg at 09/04/18 0846 .  multivitamin with minerals tablet 1 tablet, 1 tablet, Oral, Daily, Purohit, Shrey C, MD, 1 tablet at 09/03/18 1709 .  OLANZapine zydis (ZYPREXA) disintegrating tablet 5 mg, 5 mg, Oral, Q6H PRN, Karmen Bongo, MD, 5 mg at 09/02/18 2045 .  ondansetron (ZOFRAN) tablet 4 mg, 4 mg, Oral, Q6H PRN **OR** ondansetron (ZOFRAN) injection 4 mg, 4 mg, Intravenous, Q6H PRN, Karmen Bongo, MD .  pantoprazole (PROTONIX) EC tablet 40 mg, 40 mg, Oral, Daily, Karmen Bongo, MD, 40 mg at 09/03/18 0951 .  QUEtiapine (SEROQUEL) tablet 12.5 mg, 12.5 mg, Oral, Daily, Karmen Bongo, MD, 12.5 mg at 09/03/18 0951 .  QUEtiapine (SEROQUEL) tablet 50 mg, 50 mg, Oral, QHS,  Karmen Bongo, MD, 50 mg at 09/03/18 2215 .  sodium chloride flush (NS) 0.9 % injection 3 mL, 3 mL, Intravenous, Q12H, Karmen Bongo, MD, 3 mL at 09/03/18 2216  Patients Current Diet:   Diet Order            Diet heart healthy/carb modified Room service appropriate? Yes; Fluid consistency: Thin  Diet effective now              Precautions / Restrictions Precautions Precautions: Fall Precautions/Special Needs: Behavior Precaution Comments: impaired vision Restrictions Weight Bearing Restrictions: No   Has the patient had 2 or more falls or a fall with injury in the past year?No  Prior Activity Level Community (5-7x/wk): was independent; active and driving. Family noting declining memory since summer 2019  Prior Functional Level Do you want Prior Function Level of Independence: Independent Comments: Indep with shuffling gait. Retired Airline pilot. Hobbies include woodworking, welding, watching Jeopardy/History Channel from other? Self Care: Did the patient need help bathing, dressing, using the toilet or eating?  Independent  Indoor Mobility: Did the patient need assistance with walking from room to room (with or without device)? Independent  Stairs: Did the patient need assistance with internal or external stairs (with or without device)? Independent  Functional Cognition: Did the patient need help planning regular tasks such as shopping or remembering to take medications? Independent  Home Assistive Devices / Equipment Home Assistive Devices/Equipment: CBG Meter, Eyeglasses  Prior Device Use: Indicate devices/aids used by the patient prior to current illness, exacerbation or injury? None of the above  Prior Functional Level Vocation: Water engineer) Comments: Indep with shuffling gait. Retired Airline pilot. Hobbies include woodworking, welding, watching Jeopardy/History Channel   Prior Functional Level Current Functional Level  Bed Mobility  Independent   supine to sit Mod assist, difficulty initiating movement to EOB despite step by step cues; mod assist BLEs to EOB towards left side.  Transfers   Independent  mod to max assist and HHA to maintain balance once standing  Mobility - Walk/Wheelchair  Independent but shuffling gait    Mobility - Ambulation/Gait  shuffling gait Mod assist, +2 physical assistance 8 feet with bilateral HHA. Right foot lagging behind requiring increased effort to step. Fatigue and bilateral knee instability   Upper Body Dressing  independent Maximal assistance, Sitting  Lower Body Dressing  Independent Maximal assistance, +2 for physical assistance, Sit to/from stand  Grooming  Independent Sitting, Maximal assistance  Eating/Drinking  Independent Moderate assistance, Sitting  Toilet Transfer  Independent Maximal assistance, BSC, Stand-pivot, +2 for physical  assistance  Bladder Continence  continent   external catheter  Bowel Management   continent   incontinent LBM smear 2/18  Stair Climbing  independent   not attempted  Communication  intact   alert; confused  Memory  increasing confusion since summer 2019 Decreased short-term memory  Cooking/Meal Prep   independent      Housework   independent   Money Management   wife assisted   Driving   independent     Special needs/care consideration BiPAP/CPAP yes pta CPM n/a Continuous Drip IV n/a Dialysis n/a Life Vest n/a Oxygen n/a Special Bed fall precautions due to decreased safety awareness and vision loss Trach Size n/a Wound Vac n/a Skin left arm ecchymosis; groin and scrotum with moisture associated skin damage; rash to lower back; surgical incision to posterior head; Stage II to buttocks                       Bowel mgmt: incontinent LBM 2/18 smear Bladder mgmt: external catheter Diabetic mgmt yes  Previous Home Environment Living Arrangements: Spouse/significant other  Lives With: Spouse Available Help at Discharge: Family, Available 24  hours/day Type of Home: House Home Layout: Two level, Able to live on main level with bedroom/bathroom Alternate Level Stairs-Rails: Left Alternate Level Stairs-Number of Steps: 3 steps, landing and then 12 steps Home Access: Stairs to enter Entrance Stairs-Rails: Right, Left Entrance Stairs-Number of Steps: 3 Bathroom Shower/Tub: Multimedia programmer: Standard Bathroom Accessibility: Yes How Accessible: Accessible via walker [place]: No Additional Comments: can set up bedroom downstairs  Discharge Living Setting Plans for Discharge Living Setting: Patient's home, Lives with (comment) Type of Home at Discharge: House Discharge Home Layout: Two level, Able to live on main level with bedroom/bathroom, Full bath on main level Alternate Level Stairs-Rails: Left Alternate Level Stairs-Number of Steps: 3 steps, landing, then 12 steps Discharge Home Access: Stairs to enter Entrance Stairs-Rails: Right, Left, Can reach both Entrance Stairs-Number of Steps: 3 Discharge Bathroom Shower/Tub: Tub/shower unit, Curtain Discharge Bathroom Toilet: Standard Discharge Bathroom Accessibility: Yes How Accessible: Accessible via walker Does the patient have any problems obtaining your medications?: No  Social/Family/Support Systems Patient Roles: Spouse, Parent Contact Information: wife, Manuela Schwartz cell [phone] Anticipated Caregiver: wife Anticipated Caregiver's Contact Information: see above Ability/Limitations of Caregiver: no limitations Caregiver Availability: 24/7 Discharge Plan Discussed with Primary Caregiver: Yes Is Caregiver In Agreement with Plan?: Yes Does Caregiver/Family have Issues with Lodging/Transportation while Pt is in Rehab?: No  Goals/Additional Needs Patient/Family Goal for Rehab: supervision to min asisst with PT, OT, and SLP Expected length of stay: ELOS 10 to 14 days Special Service Needs: new blindness postoperatively Pt/Family Agrees to  Admission and willing to participate: Yes Program Orientation Provided & Reviewed with Pt/Caregiver Including Roles  & Responsibilities: Yes  Patient Condition: I have reviewed medical records from [facility], spoken with patient and  Spouse. I met with patient at the bedside  for inpatient rehabilitation assessment. Patient previously at [facility] 08/28/2018 until 09/01/2018 when acute medical issues required readmission to Acute hospital. Patient's case reviewed with Medical team . Please refer to History and Physical for updated Medical issues. Patient will benefit from ongoing PT, OT, and SLP, can actively participate in 3 hours of therapy a day 5 days of the week, and can make measurable gains during the admission.  Patient will also benefit from the coordinated team approach during an Inpatient Acute Rehabilitation admission.  The patient will receive  intensive therapy as well as Rehabilitation physician, nursing, social worker, and care management interventions.  Due to bowel management, bladder management, safety, skin/wound care, disease management, medical administration, pain management, patient education the patient requires 24 hour a day rehabilitation nursing.  The patient is currently mod to max with mobility and basic ADLs.  Discharge setting and therapy post discharge at  home with home health is anticipated.  Patient/Family  has agreed to participate in the Acute Inpatient Rehabilitation Program and will Readmit today to [facility].  Preadmission Screen Completed By:  Cleatrice Burke RN MSN, 09/04/2018 9:24 AM ______________________________________________________________________   Discussed status with Dr. Letta Pate  on  09/04/2018  at  1304 and received telephone approval for admission today.  Admission Coordinator:  Cleatrice Burke RN MSN, time  [id number] Date  09/04/2018   Assessment/Plan: Diagnosis:Functional deficits related to meningioma,  1. Does the need for close,  24 hr/day  Medical supervision in concert with the patient's rehab needs make it unreasonable for this patient to be served in a less intensive setting? Yes 2. Co-Morbidities requiring supervision/potential complications: SVT, Dysphagia, CAD, HTN 3. Due to bladder management, bowel management, safety, skin/wound care, disease management, medication administration, pain management and patient education, does the patient require 24 hr/day rehab nursing? Yes 4. Does the patient require coordinated care of a physician, rehab nurse, PT (1-2 hrs/day, 5 days/week), OT (1-2 hrs/day, 5 days/week) and SLP (.5-1 hrs/day, 5 days/week) to address physical and functional deficits in the context of the above medical diagnosis(es)? Yes Addressing deficits in the following areas: balance, endurance, locomotion, strength, transferring, bowel/bladder control, bathing, dressing, feeding, grooming, toileting, cognition and psychosocial support 5. Can the patient actively participate in an intensive therapy program of at least 3 hrs of therapy 5 days a week? Yes 6. The potential for patient to make measurable gains while on inpatient rehab is good 7. Anticipated functional outcomes upon discharge from inpatients are: supervision PT, supervision OT, supervision SLP 8. Estimated rehab length of stay to reach the above functional goals is: 10-14d 9. Anticipated D/C setting: Home 10. Anticipated post D/C treatments: [place] therapy 11. Overall Rehab/Functional Prognosis: good  [name] M.D. [facility] FAAPM&R (Sports Med, Neuromuscular Med) Diplomate Am Board of Electrodiagnostic Med  Cleatrice Burke RN MSN Admissions Coordinator 09/04/2018

## 2018-09-04 NOTE — Evaluation (Signed)
Occupational Therapy Assessment and Plan  Patient Details  Name: Jason Farrell. MRN: [id number] Date of Birth: 09/24/1940  OT Diagnosis: apraxia, ataxia, cognitive deficits and muscle weakness (generalized) Rehab Potential: Rehab Potential (ACUTE ONLY): Good ELOS: 21-24 days   Today's Date: 09/05/2018 OT Individual Time: 1015-1110 OT Individual Time Calculation (min): 55 min     Problem List:  Patient Active Problem List   Diagnosis Date Noted  . Pressure injury of skin 09/02/2018  . PRES (posterior reversible encephalopathy syndrome) 08/30/2018  . Acute blood loss anemia   . Hypoalbuminemia due to protein-calorie malnutrition ([place])   . Hypokalemia   . Leukocytosis   . Encephalopathy   . SVT (supraventricular tachycardia) ([place])   . 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 Debility   . Meningioma ([place])   . 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 Senile purpura ([place]) 02/21/2018  . Type 2 diabetes mellitus with microalbuminuria, without long-term current use of insulin ([place]) 02/21/2018  . Controlled type 2 diabetes mellitus without complication, without long-term current use of insulin ([place]) 11/11/2017  . 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 Obstructive sleep apnea on CPAP 12/09/2015  . Acid reflux 05/06/2015  . Arthritis, degenerative 05/06/2015  . CAD in native artery 01/25/2015  . Diabetes mellitus type 2 in obese ([place]) 08/16/2014  . Hyperlipidemia 08/16/2014  . Essential hypertension 08/16/2014  . Morbid obesity ([place]) 08/16/2014  . Stopped smoking with greater than 30 pack year history 08/16/2014  . COLD (chronic obstructive lung disease) ([place]) 08/16/2014    Past Medical History:  Past Medical History:  Diagnosis Date  . Coronary artery disease   . Diabetes mellitus without complication ([place])   . Dyslipidemia   . GERD (gastroesophageal reflux disease)   . Hyperlipidemia   . Hypertension   . MI (myocardial infarction) ([place])   .  Osteoarthritis    Past Surgical History:  Past Surgical History:  Procedure Laterality Date  . CARDIAC CATHETERIZATION  2006   stent placement  . COLONOSCOPY    . CORONARY ANGIOPLASTY WITH STENT PLACEMENT  2006   stent x 2   . CORONARY ANGIOPLASTY WITH STENT PLACEMENT  1996   stent x 3   . FOOT SURGERY Right   . HALLUX VALGUS CHEILECTOMY Right 02/25/2015   Procedure: HALLUX VALGUS CHEILECTOMY;  Surgeon: [name], DPM;  Location: [facility] ORS;  Service: Podiatry;  Laterality: Right;    Assessment & Plan Clinical Impression: Patient is a 78 y.o. year old male with recent admission to the hospital 08/11/2018 for evaluation after being seen in the ED 1/26 for headaches and symptoms of difficulty with speech, sleep disturbance and behavioral changes since onset of summer. Significant worsening over past 3 to 4 weeks. Patient wandering, emotionally labile and crying frequently. 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 Craniotomy with resection of pineal region mass and placement of EVD on 76/27/2019 by  Dr. [name]. Post procedure treated with decadron and IV Keppra X 7 days.  Drain removed with 1/30 and on 1/31 he developed decrease in LOC requiring transfer to ICU. 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  MRI brain showed small amount of hemorrhage at resection site with focal hemorrhage/clot within right ventricle and unchanged appearance of increased T2/Flair signal.  CTA head/neck done reveling narrow/diminshed flow in right V4 segment question due to dissection, thrombus or vasospasm.    He with resultant confusion with agitation and loss of bilateral vision. Neurology felt that patient's  MS changes were felt to be due to delirium and did not feel seizures were cause -- d/c Keppra after 7 days.   Repeat CT head showed no change in small left parietal SDH with moderate R>L ventriculomegaly and no significant interval change.  Ophthalmology  (Dr. [name])  consulted and felt that visual loss cortical in nature due to lack of ophthalmologic finding --likely due to PRES over PION.  Developed increasing confusion, lethargy and SVT on 09/01/2018. Cardiology tried Lopressor without any improvement and adenosine had been given. Placed on Amiodarone drip and readmitted patient to acute hospital. No recurrent SVT on Tele.   Neurology consulted EEG showed no evidence of epileptiform activity on 2/17.Continue conservative management of delirium. Continue nightly Seroquel.  Cardiology consulted. Patient required 2 doses of adenosine and was loaded with amiodarone. Continue metoprolol. Patient more alert but remains confused. Patient ready for readmission today 09/04/2018.   Patient transferred to CIR on 09/04/2018 .    Patient currently requires total with basic self-care skills secondary to muscle weakness, impaired timing and sequencing, unbalanced muscle activation, motor apraxia, ataxia, decreased coordination and decreased motor planning, decreased visual perceptual skills, decreased midline orientation, decreased motor planning and ideational apraxia, decreased initiation, decreased attention, decreased awareness, decreased problem solving, decreased safety awareness, decreased memory and delayed processing and decreased sitting balance, decreased standing balance, decreased postural control and decreased balance strategies.  Prior to hospitalization, patient could complete BADL with independent .  Patient will benefit from skilled intervention to decrease level of assist with basic self-care skills and increase independence with basic self-care skills prior to discharge home with care partner.  Anticipate patient will require 24 hour supervision and minimal physical assistance and follow up home health.  OT - End of Session Endurance Deficit: Yes Endurance Deficit Description: 2/2 generalized deconditioning OT Assessment Rehab Potential (ACUTE ONLY): Good OT  Patient demonstrates impairments in the following area(s): Behavior;Balance;Cognition;Endurance;Motor;Nutrition;Perception;Safety;Vision;Sensory OT Basic ADL's Functional Problem(s): Eating;Grooming;Bathing;Dressing;Toileting OT Transfers Functional Problem(s): Toilet;Tub/Shower OT Additional Impairment(s): None OT Plan OT Intensity: Minimum of 1-2 x/day, 45 to 90 minutes OT Frequency: 5 out of 7 days OT Duration/Estimated Length of Stay: 21-24 days OT Treatment/Interventions: Balance/vestibular training;Cognitive remediation/compensation;Community reintegration;Discharge planning;DME/adaptive equipment instruction;Functional mobility training;Neuromuscular re-education;Patient/family education;Psychosocial support;Self Care/advanced ADL retraining;Therapeutic Activities;Therapeutic Exercise;UE/LE Strength taining/ROM;UE/LE Coordination activities;Visual/perceptual remediation/compensation;Wheelchair propulsion/positioning OT Self Feeding Anticipated Outcome(s): Min A OT Basic Self-Care Anticipated Outcome(s): Min A OT Toileting Anticipated Outcome(s): Min A OT Bathroom Transfers Anticipated Outcome(s): Min A OT Recommendation Patient destination: Home Follow Up Recommendations: Home health OT Equipment Recommended: To be determined  Skilled Therapeutic Intervention Pt greeted seated in wc asleep at nurses station. Pt needed max multimodal cues to wake. Pt brought to room for BADL tasks. Pt lethargic throughout session and needed constant cues to maintain alertness. Pt was scooted almost to the edge of his chair and needed max multimodal cues and max A to scoot back in chair for safety. Pt would wake up and follow 1 command inconsistently. OT provided hand over hand to initiate bathing task at the sink, then pt able to follow through for short time, but not thoroughly. Was able to engage pt in sit<>stand with mod/max A and max verbal cues to initiate. Once standing, OT engaged pt in hand washing  task. Pt attempting to "roll up his sleeves" even though he did not have sleeves, then appropriately brought hands towards water at midline. Max A for standing balance without UE support. Total A for LB ADLs 2/2 decreased alertness and difficulty  following commands. OT unable to wake pt after second sit<>stand. Nursing and PA entered room to check vitals and CBG which were all WNL. Total A +2 squat-pivot to return to [place] bed. Pt left with nursing present and needs met.   OT Evaluation Precautions/Restrictions  Precautions Precautions: Fall Restrictions Weight Bearing Restrictions: No Pain No pain noted Home Living/Prior Functioning Home Living Family/patient expects to be discharged to:: Private residence Living Arrangements: Spouse/significant other Available Help at Discharge: Family, Available 24 hours/day Type of Home: House Home Access: Stairs to enter [employer] of Steps: 3 Entrance Stairs-Rails: Right, Left Home Layout: Two level, Able to live on main level with bedroom/bathroom Alternate Level Stairs-Number of Steps: 3 steps, landing and then 12 steps Alternate Level Stairs-Rails: Left Bathroom Shower/Tub: Multimedia programmer: Standard Bathroom Accessibility: Yes Additional Comments: can set up bedroom downstairs  Lives With: Spouse Prior Function Level of Independence: Independent with basic ADLs, Independent with homemaking with ambulation  Able to Take Stairs?: Yes Driving: Yes Vocation: Retired Leisure: Hobbies-yes (Comment) Comments:  Retired Airline pilot. Hobbies include woodworking, welding, watching Jeopardy/History Channel ADL ADL Eating: Dependent Grooming: Dependent Upper Body Bathing: Maximal cueing, Maximal assistance Where Assessed-Upper Body Bathing: Wheelchair, Sitting at sink Lower Body Bathing: Dependent Where Assessed-Lower Body Bathing: Sitting at sink, Wheelchair Upper Body Dressing: Maximal assistance, Maximal  cueing Where Assessed-Upper Body Dressing: Wheelchair Lower Body Dressing: Dependent, Maximal cueing Where Assessed-Lower Body Dressing: Wheelchair Toileting: Dependent Toilet Transfer: Unable to assess Vision Baseline Vision/History: Wears glasses Wears Glasses: At all times Vision Assessment?: Vision impaired- to be further tested in functional context Depth Perception: Overshoots;Undershoots Perception  Perception: Impaired Inattention/Neglect: Impaired-to be further tested in functional context Praxis Praxis: Impaired Praxis Impairment Details: Ideation;Initiation;Ideomotor;Motor planning;Perseveration Cognition Overall Cognitive Status: Impaired/Different from baseline Arousal/Alertness: Awake/alert Orientation Level: Person Year: Other (Comment)(Unable to answer) Month: (Unable to answer) Day of Week: (unable to answer) Memory: Impaired Memory Impairment: Decreased recall of new information;Decreased short term memory Immediate Memory Recall: Bed;Blue;Sock(unable to answer or repeat words) Memory Recall: (none) Focused Attention: Appears intact Awareness: Impaired Problem Solving: Impaired Safety/Judgment: Impaired Sensation Sensation Light Touch: Appears Intact Additional Comments: reports intact to light touch Coordination Gross Motor Movements are Fluid and Coordinated: No Fine Motor Movements are Fluid and Coordinated: No Coordination and Movement Description: ataxia, decreased smoothness and accuracy of fine motor and gross motor movements Motor  Motor Motor: Ataxia;Motor apraxia;Motor perseverations Motor - Skilled Clinical Observations: ataxic with fine movements and as broad based gait, perseverates with visual hallucinations of tieing or handling pills Motor - Discharge Observations: generalized deconditioning Mobility  Bed Mobility Bed Mobility: Rolling Right;Rolling Left;Right Sidelying to Sit;Left Sidelying to Sit;Sit to Sidelying Left Rolling Right:  Moderate Assistance - Patient 50-74% Rolling Left: Minimal Assistance - Patient > 75% Right Sidelying to Sit: Maximal Assistance - Patient 25-49%(from enclosure bed) Left Sidelying to Sit: Minimal Assistance - Patient >75%(from mat in gym) Sit to Sidelying Left: Moderate Assistance - Patient 50-74%(for feet onto bed with apraxia, increased time) Transfers Sit to Stand: Minimal Assistance - Patient > 75%;Moderate Assistance - Patient 50-74% Stand to Sit: Minimal Assistance - Patient > 75%  Trunk/Postural Assessment  Thoracic Assessment Thoracic Assessment: Exceptions to WFL(kyphosis with rounded shoulders) Lumbar Assessment Lumbar Assessment: Exceptions to WFL(PPT) Postural Control Postural Control: Deficits on evaluation Righting Reactions: impaired/delayed Protective Responses: impaired/delayed  Balance Balance Balance Assessed: Yes Static Sitting Balance Static Sitting - Balance Support: Feet supported Static Sitting - Level of Assistance: 5: Stand by assistance Dynamic Sitting Balance  Dynamic Sitting - Balance Support: Feet supported Dynamic Sitting - Level of Assistance: 3: Mod assist Static Standing Balance Static Standing - Balance Support: During functional activity Static Standing - Level of Assistance: 3: Mod assist Dynamic Standing Balance Dynamic Standing - Balance Support: During functional activity;No upper extremity supported Dynamic Standing - Level of Assistance: 3: Mod assist;1: +2 Total assist Dynamic Standing - Comments: gait without device Extremity/Trunk Assessment RUE Assessment General Strength Comments: difficulty formally testing 2/2 cognitive deficits- grossly WFL LUE Assessment General Strength Comments: difficulty formally testing 2/2 cognitive deficits- grossly WFL    Refer to Care Plan for Long Term Goals  Recommendations for other services: None   At this time, pt not appropriate  Discharge Criteria: Patient will be discharged from OT if  patient refuses treatment 3 consecutive times without medical reason, if treatment goals not met, if there is a change in medical status, if patient makes no progress towards goals or if patient is discharged from hospital.  The above assessment, treatment plan, treatment alternatives and goals were discussed and mutually agreed upon: No family available/patient unable  Valma Cava 09/05/2018, 12:45 PM

## 2018-09-05 ENCOUNTER — Inpatient Hospital Stay (HOSPITAL_COMMUNITY): Payer: Managed Care, Other (non HMO) | Admitting: Occupational Therapy

## 2018-09-05 ENCOUNTER — Inpatient Hospital Stay (HOSPITAL_COMMUNITY): Payer: Managed Care, Other (non HMO) | Admitting: Speech Pathology

## 2018-09-05 ENCOUNTER — Inpatient Hospital Stay (HOSPITAL_COMMUNITY): Payer: Managed Care, Other (non HMO)

## 2018-09-05 DIAGNOSIS — E669 Obesity, unspecified: Secondary | ICD-10-CM

## 2018-09-05 DIAGNOSIS — I471 Supraventricular tachycardia: Secondary | ICD-10-CM

## 2018-09-05 DIAGNOSIS — E876 Hypokalemia: Secondary | ICD-10-CM

## 2018-09-05 DIAGNOSIS — E1169 Type 2 diabetes mellitus with other specified complication: Secondary | ICD-10-CM

## 2018-09-05 LAB — MAGNESIUM: Magnesium: 1.6 mg/dL — ABNORMAL LOW (ref 1.7–2.4)

## 2018-09-05 LAB — CBC WITH DIFFERENTIAL/PLATELET
Abs Immature Granulocytes: 0.06 10*3/uL (ref 0.00–0.07)
Basophils Absolute: 0.1 10*3/uL (ref 0.0–0.1)
Basophils Relative: 1 %
Eosinophils Absolute: 0.1 10*3/uL (ref 0.0–0.5)
Eosinophils Relative: 2 %
HCT: 37.2 % — ABNORMAL LOW (ref 39.0–52.0)
Hemoglobin: 11.9 g/dL — ABNORMAL LOW (ref 13.0–17.0)
IMMATURE GRANULOCYTES: 1 %
Lymphocytes Relative: 11 %
Lymphs Abs: 0.8 10*3/uL (ref 0.7–4.0)
MCH: 28.8 pg (ref 26.0–34.0)
MCHC: 32 g/dL (ref 30.0–36.0)
MCV: 90.1 fL (ref 80.0–100.0)
MONOS PCT: 10 %
Monocytes Absolute: 0.7 10*3/uL (ref 0.1–1.0)
NEUTROS PCT: 75 %
Neutro Abs: 5.2 10*3/uL (ref 1.7–7.7)
Platelets: 322 10*3/uL (ref 150–400)
RBC: 4.13 MIL/uL — ABNORMAL LOW (ref 4.22–5.81)
RDW: 14.7 % (ref 11.5–15.5)
WBC: 7 10*3/uL (ref 4.0–10.5)
nRBC: 0 % (ref 0.0–0.2)

## 2018-09-05 LAB — GLUCOSE, CAPILLARY
Glucose-Capillary: 174 mg/dL — ABNORMAL HIGH (ref 70–99)
Glucose-Capillary: 175 mg/dL — ABNORMAL HIGH (ref 70–99)
Glucose-Capillary: 256 mg/dL — ABNORMAL HIGH (ref 70–99)
Glucose-Capillary: 67 mg/dL — ABNORMAL LOW (ref 70–99)
Glucose-Capillary: 101 mg/dL — ABNORMAL HIGH (ref 70–99)

## 2018-09-05 LAB — COMPREHENSIVE METABOLIC PANEL
ALT: 19 U/L (ref 0–44)
AST: 12 U/L — ABNORMAL LOW (ref 15–41)
Albumin: 2.3 g/dL — ABNORMAL LOW (ref 3.5–5.0)
Alkaline Phosphatase: 66 U/L (ref 38–126)
Anion gap: 12 (ref 5–15)
BILIRUBIN TOTAL: 0.8 mg/dL (ref 0.3–1.2)
BUN: 16 mg/dL (ref 8–23)
CO2: 23 mmol/L (ref 22–32)
Calcium: 8.7 mg/dL — ABNORMAL LOW (ref 8.9–10.3)
Chloride: 101 mmol/L (ref 98–111)
Creatinine, Ser: 0.92 mg/dL (ref 0.61–1.24)
GFR calc Af Amer: >60 mL/min (ref >60)
Glucose, Bld: 182 mg/dL — ABNORMAL HIGH (ref 70–99)
Potassium: 3.3 mmol/L — ABNORMAL LOW (ref 3.5–5.1)
Sodium: 136 mmol/L (ref 135–145)
Total Protein: 5.7 g/dL — ABNORMAL LOW (ref 6.5–8.1)

## 2018-09-05 MED ORDER — ENSURE MAX PROTEIN PO LIQD
11.0000 [oz_av] | Freq: Every day | ORAL | Status: DC
  Filled 2018-09-05: qty 330

## 2018-09-05 MED ORDER — QUETIAPINE FUMARATE 50 MG PO TABS
50.0000 mg | ORAL_TABLET | Freq: Every day | ORAL | Status: DC
  Administered 2018-09-05 – 2018-09-11 (×7): 50 mg via ORAL
  Filled 2018-09-05 (×7): qty 1

## 2018-09-05 MED ORDER — QUETIAPINE FUMARATE 25 MG PO TABS
12.5000 mg | ORAL_TABLET | Freq: Two times a day (BID) | ORAL | Status: DC | PRN
  Administered 2018-09-08 – 2018-09-17 (×2): 12.5 mg via ORAL
  Filled 2018-09-05 (×2): qty 1

## 2018-09-05 MED ORDER — MAGNESIUM OXIDE 400 (241.3 MG) MG PO TABS
400.0000 mg | ORAL_TABLET | Freq: Two times a day (BID) | ORAL | Status: DC
  Administered 2018-09-05 – 2018-09-18 (×26): 400 mg via ORAL
  Filled 2018-09-05 (×26): qty 1

## 2018-09-05 MED ORDER — POTASSIUM CHLORIDE CRYS ER 20 MEQ PO TBCR
40.0000 meq | EXTENDED_RELEASE_TABLET | Freq: Two times a day (BID) | ORAL | Status: DC
  Administered 2018-09-05 – 2018-09-10 (×10): 40 meq via ORAL
  Filled 2018-09-05 (×10): qty 2

## 2018-09-05 MED ORDER — UNJURY CHICKEN SOUP POWDER
2.0000 [oz_av] | Freq: Four times a day (QID) | ORAL | Status: DC
  Filled 2018-09-05 (×2): qty 27

## 2018-09-05 MED ORDER — ENSURE MAX PROTEIN PO LIQD
11.0000 [oz_av] | Freq: Three times a day (TID) | ORAL | Status: DC
  Administered 2018-09-06 – 2018-09-10 (×12): 11 [oz_av] via ORAL
  Filled 2018-09-05 (×14): qty 330

## 2018-09-05 MED ORDER — GLIPIZIDE 5 MG PO TABS
2.5000 mg | ORAL_TABLET | Freq: Two times a day (BID) | ORAL | Status: DC
  Administered 2018-09-05 – 2018-09-08 (×6): 2.5 mg via ORAL
  Filled 2018-09-05 (×6): qty 1

## 2018-09-05 NOTE — IPOC Note (Signed)
Overall Plan of Care (IPOC) Patient Details Name: Jason Farrell. MRN: [id number] DOB: 1941/05/22  Admitting Diagnosis: <principal problem not specified>  Hospital Problems: Active Problems:   Meningioma [facility])     Functional Problem List: Nursing Skin Integrity, Bowel, Bladder, Endurance, Medication Management, Perception  PT Balance, Endurance, Perception, Motor, Sensory, Safety  OT Behavior, Balance, Cognition, Endurance, Motor, Nutrition, Perception, Safety, Vision, Sensory  SLP Cognition, Safety, Perception  TR         Basic ADL's: OT Eating, Grooming, Bathing, Dressing, Toileting     Advanced  ADL's: OT       Transfers: PT Bed Mobility, Furniture, Bed to Chair, Musician, Futures trader, Metallurgist: PT Ambulation, Stairs     Additional Impairments: OT None  SLP Social Cognition   Social Interaction, Problem Solving, Attention, Awareness, Memory  TR      Anticipated Outcomes Item Anticipated Outcome  Self Feeding Min A  Swallowing      Basic self-care  Min A  Toileting  Min A   Bathroom Transfers Min A  Bowel/Bladder  manage bowel and bladder with min assist  Transfers  supervision >CGA with LRAD  Locomotion  CGA with LRAD  Communication     Cognition  Min-Mod A   Pain     Safety/Judgment  manage safety with min assist   Therapy Plan: PT Intensity: Minimum of 1-2 x/day ,45 to 90 minutes PT Frequency: 5 out of 7 days PT Duration Estimated Length of Stay: 14-18 days OT Intensity: Minimum of 1-2 x/day, 45 to 90 minutes OT Frequency: 5 out of 7 days OT Duration/Estimated Length of Stay: 21-24 days SLP Intensity: Minumum of 1-2 x/day, 30 to 90 minutes SLP Frequency: 3 to 5 out of 7 days SLP Duration/Estimated Length of Stay: 21-24 days     Team Interventions: Nursing Interventions Patient/Family Education, Bowel Management, Bladder Management, Disease Management/Prevention, Medication Management, Cognitive  Remediation/Compensation, Skin Care/Wound Management  PT interventions Ambulation/gait training, Balance/vestibular training, Cognitive remediation/compensation, DME/adaptive equipment instruction, Functional mobility training, Patient/family education, UE/LE Strength taining/ROM, UE/LE Coordination activities, Therapeutic Activities, Therapeutic Exercise, Stair training, Visual/perceptual remediation/compensation, Wheelchair propulsion/positioning  OT Interventions Training and development officer, Cognitive remediation/compensation, Academic librarian, Discharge planning, DME/adaptive equipment instruction, Functional mobility training, Neuromuscular re-education, Patient/family education, Psychosocial support, Self Care/advanced ADL retraining, Therapeutic Activities, Therapeutic Exercise, UE/LE Strength taining/ROM, UE/LE Coordination activities, Visual/perceptual remediation/compensation, Wheelchair propulsion/positioning  SLP Interventions Cognitive remediation/compensation, Environmental controls, Internal/external aids, Therapeutic Activities, Patient/family education, Functional tasks, Cueing hierarchy  TR Interventions    SW/CM Interventions     Barriers to Discharge MD  Medical stability and Behavior  Nursing      PT Home environment access/layout, Inaccessible home environment steps to enter multilevel home  OT      SLP (new visual deficits )    SW       Team Discharge Planning: Destination: PT-Home ,OT- Home , SLP-Home Projected Follow-up: PT-Home health PT, 24 hour supervision/assistance, OT-  Home health OT, SLP-24 hour supervision/assistance, Home Health SLP Projected Equipment Needs: PT-To be determined, OT- To be determined, SLP-None recommended by SLP Equipment Details: PT- , OT-  Patient/family involved in discharge planning: PT- Patient,  OT-Patient unable/family or caregiver not available, SLP-Patient, Family member/caregiver  MD ELOS: 16-20 days Medical Rehab  Prognosis:  Excellent Assessment: The patient has been admitted for CIR therapies with the diagnosis of meningioma/IVH. The team will be addressing functional mobility, strength, stamina, balance, safety, adaptive techniques and equipment, self-care, bowel and bladder mgt,  patient and caregiver education, NMR, behavior, cognition, pain control, community reentry. Goals have been set at min assist for mobility and self-care and min assist to mod assist for cognition.    Meredith Staggers, MD, FAAPMR      See Team Conference Notes for weekly updates to the plan of care

## 2018-09-05 NOTE — Progress Notes (Signed)
[facility] PHYSICAL MEDICINE & REHABILITATION PROGRESS NOTE   Subjective/Complaints: Pt in enclosure bed. Appears comfortable. Says he thinks he has surgery today  ROS: Limited due to cognitive/behavioral    Objective:   No results found. Recent Labs    09/04/18 0424 09/05/18 0545  WBC 7.3 7.0  HGB 12.3* 11.9*  HCT 37.8* 37.2*  PLT 308 322   Recent Labs    09/04/18 0424 09/05/18 0545  NA 138 136  K 3.2* 3.3*  CL 100 101  CO2 24 23  GLUCOSE 186* 182*  BUN 12 16  CREATININE 0.89 0.92  CALCIUM 8.6* 8.7*    Intake/Output Summary (Last 24 hours) at 09/05/2018 0801 Last data filed at 09/05/2018 0500 Gross per 24 hour  Intake 240 ml  Output 343 ml  Net -103 ml     Physical Exam: Vital Signs Blood pressure (!) 142/68, pulse 100, temperature 97.7 F (36.5 C), temperature source Oral, resp. rate 18, height 5\' 6"  (1.676 m), weight 86 kg, SpO2 95 %. Constitutional: No distress . Vital signs reviewed. HEENT: EOMI, oral membranes moist Neck: supple Cardiovascular: RRR without murmur. No JVD    Respiratory: CTA Bilaterally without wheezes or rales. Normal effort    GI: BS +, non-tender, non-distended  Extremities: No clubbing, cyanosis, or edema Skin: No evidence of breakdown, no evidence of rash Neurologic: alert. Oriented to self and hospital.  Restless and distracted. motor strength is grossly 4/5 in bilateral deltoid, bicep, tricep, grip, hip flexor, knee extensors, ankle dorsiflexor and plantar flexor Sensory exam not assessed secondary to very poor concentration and attention Musculoskeletal: Full range of motion in all 4 extremities. No joint swelling Psych: confused, non-agitate   Assessment/Plan: 1. 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  Physiatrist is providing close team supervision and 24 hour management of active medical problems listed  below.  Physiatrist and rehab team continue to assess barriers to discharge/monitor patient progress toward functional and medical goals  Care Tool:  Bathing              Bathing assist       Upper Body Dressing/Undressing Upper body dressing   What is the patient wearing?: Pull over shirt    Upper body assist Assist Level: Maximal Assistance - Patient 25 - 49%    Lower Body Dressing/Undressing Lower body dressing            Lower body assist       Toileting Toileting    Toileting assist       Transfers Chair/bed transfer  Transfers assist           Locomotion Ambulation   Ambulation assist              Walk 10 feet activity   Assist           Walk 50 feet activity   Assist           Walk 150 feet activity   Assist           Walk 10 feet on uneven surface  activity   Assist           Wheelchair     Assist               Wheelchair 50 feet with 2 turns activity    Assist            Wheelchair 150 feet  activity     Assist          Medical Problem List and Plan: 1. decline in self care and mobility secondary to meningioma with IVH  -beginning therapies today 2. DVT Prophylaxis/Anticoagulation: Pharmaceutical:Lovenox 3. Pain Management:tylenol prn. 4. Mood:LCSW to follow for evaluation and support. 5. Neuropsych: This patientis notcapable of making decisions on hisown behalf. 6. Skin/Wound Care:Routine pressure relief measures. 7. Fluids/Electrolytes/Nutrition:Monitor I/O.    -hypokalemia: 3.3, continue supplementation  -hypomagnesia: 1.6, supplement  -prostat for low albumin  -encourage PO 8. Recurrent SVT: Resolved. Continueamiodarone and metoprolol.Continue to monitor HR bid.    -watch electrolytes 9. HTN: ON Lisinopril, Microzide, Metoprolol and Norvasc. Monitor BP bid   -reasonable control at present 10. Delirium/Hallucinations: Continue seroquel bid and  titrate as indicated---should improve in appropriate/controlled environment.   -Continue ativan prn (limited) at nights and monitor sleep wake cycle.   Enclosure bed required for safety 11. T2DM: poor control  -pt on glucotrol 5mg  bid ac at home. Resume at 2.5mg  bid ac today 12. OSA/Hypoxia: Needed oxygen with activity 2/20. Resume CPAP as tolerated. May not be feasible right now given ongoing confusion.   Wean oxygen as able.    -observe for now  -consider cxr if further cough/hypoxia    LOS: 1 days A FACE TO [place] 09/05/2018, 8:01 AM

## 2018-09-05 NOTE — Progress Notes (Signed)
Per nursing, patient was given "Data Collection Information Summary for Patients in Inpatient Rehabilitation Facilities with attached [place] Records" upon admission, which was included in the patient education notebook.    Patient information reviewed and entered into [facility] System by Ileana Ladd, [place] coordinator. Information including medical coding, function ability, and quality indicators will be reviewed and updated through discharge.

## 2018-09-05 NOTE — Evaluation (Addendum)
Physical Therapy Assessment and Plan  Patient Details  Name: Jason Farrell. MRN: [id number] Date of Birth: [birth year]  PT Diagnosis: Abnormality of gait, Ataxia and Impaired cognition Rehab Potential: Good ELOS: 14-18 days   Today's Date: 09/05/2018 PT Individual Time: 4128-7867 PT Individual Time Calculation (min): 75 min    Problem List:  Patient Active Problem List   Diagnosis Date Noted  . Pressure injury of skin 09/02/2018  . PRES (posterior reversible encephalopathy syndrome) 08/30/2018  . Acute blood loss anemia   . Hypoalbuminemia due to protein-calorie malnutrition ([place])   . Hypokalemia   . Leukocytosis   . Encephalopathy   . SVT (supraventricular tachycardia) ([place])   . 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 Debility   . Meningioma ([place])   . 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 Senile purpura ([place]) 02/21/2018  . Type 2 diabetes mellitus with microalbuminuria, without long-term current use of insulin ([place]) 02/21/2018  . Controlled type 2 diabetes mellitus without complication, without long-term current use of insulin ([place]) 11/11/2017  . 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 Obstructive sleep apnea on CPAP 12/09/2015  . Acid reflux 05/06/2015  . Arthritis, degenerative 05/06/2015  . CAD in native artery 01/25/2015  . Diabetes mellitus type 2 in obese ([place]) 08/16/2014  . Hyperlipidemia 08/16/2014  . Essential hypertension 08/16/2014  . Morbid obesity ([place]) 08/16/2014  . Stopped smoking with greater than 30 pack year history 08/16/2014  . COLD (chronic obstructive lung disease) ([place]) 08/16/2014    Past Medical History:  Past Medical History:  Diagnosis Date  . Coronary artery disease   . Diabetes mellitus without complication ([place])   . Dyslipidemia   . GERD (gastroesophageal reflux disease)   . Hyperlipidemia   . Hypertension   . MI (myocardial infarction) ([place])   . Osteoarthritis    Past Surgical History:  Past Surgical  History:  Procedure Laterality Date  . CARDIAC CATHETERIZATION  2006   stent placement  . COLONOSCOPY    . CORONARY ANGIOPLASTY WITH STENT PLACEMENT  2006   stent x 2   . CORONARY ANGIOPLASTY WITH STENT PLACEMENT  1996   stent x 3   . FOOT SURGERY Right   . HALLUX VALGUS CHEILECTOMY Right 02/25/2015   Procedure: HALLUX VALGUS CHEILECTOMY;  Surgeon: [name], DPM;  Location: [facility] ORS;  Service: Podiatry;  Laterality: Right;    Assessment & Plan Clinical Impression:Patient is a 78 y.o. year old male with recent admission to the hospital 08/11/2018 for evaluation after being seen in the ED 1/26 for headaches and symptoms of difficulty with speech, sleep disturbance and behavioral changes since onset of summer. Significant worsening over past 3 to 4 weeks. Patient wandering, emotionally labile and crying frequently. 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 Craniotomy withresection of pineal region massand placement of EVDon 08/11/2017 by Dr. [name]. Post procedure treated with decadron and IV Keppra X 7 days. Drain removed with 1/30 and on 1/31 he developed decrease in LOC requiring transfer to ICU. 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 MRI brain showed small amount of hemorrhage at resection site with focal hemorrhage/clot within right ventricle and unchanged appearance of increased T2/Flair signal. CTA head/neck done reveling narrow/diminshed flow in right V4 segment question due to dissection, thrombus or vasospasm.   He with resultant confusion with agitation and loss of bilateral vision. Neurology felt that patient'sMS changes were felt to bedue to delirium and did not feel seizures were cause --  d/c Keppra after 7 days. Repeat CT head showed no change in small left parietal SDH with moderate R>L ventriculomegaly and no significant interval change. Ophthalmology (Dr. [name]) consulted and felt that visual loss cortical in nature due  to lack of ophthalmologic finding --likely due to PRES over PION.  Developed increasing confusion, lethargy and SVT on 09/01/2018. Cardiology tried Lopressor without any improvement and adenosine had been given. Placed on Amiodarone drip and readmitted patient to acute hospital.No recurrent SVT on Tele.  Neurology consulted EEG showed no evidence of epileptiform activity on 2/17.Continue conservative management of delirium. Continue nightly Seroquel. Cardiology consulted. Patient required 2 doses of adenosine and was loaded with amiodarone. Continue metoprolol. Patient more alert but remains confused. Patient ready for readmission today 09/04/2018.  Patient currently requires mod with mobility secondary to motor apraxia and ataxia, decreased visual acuity and diplopia, decreased motor planning and decreased attention, decreased awareness, decreased safety awareness and decreased memory.  Prior to hospitalization, patient was independent  with mobility and lived with Spouse in a House home.  Home access is 3Stairs to enter.  Patient will benefit from skilled PT intervention to maximize safe functional mobility, minimize fall risk and decrease caregiver burden for planned discharge home with 24 hour assist.  Anticipate patient will benefit from follow up [facility] at discharge.  PT - End of Session Activity Tolerance: Decreased this session;Tolerates 30+ min activity with multiple rests Endurance Deficit: Yes Endurance Deficit Description: 2/2 generalized deconditioning PT Assessment Rehab Potential (ACUTE/IP ONLY): Good PT Barriers to Discharge: Home environment access/layout;Inaccessible home environment PT Barriers to Discharge Comments: steps to enter multilevel home PT Patient demonstrates impairments in the following area(s): Balance;Endurance;Perception;Motor;Sensory;Safety PT Transfers Functional Problem(s): Bed Mobility;Furniture;Bed to Chair;Car;Floor PT Locomotion Functional Problem(s):  Ambulation;Stairs PT Plan PT Intensity: Minimum of 1-2 x/day ,45 to 90 minutes PT Frequency: 5 out of 7 days PT Duration Estimated Length of Stay: 14-18 days PT Treatment/Interventions: Ambulation/gait training;Balance/vestibular training;Cognitive remediation/compensation;DME/adaptive equipment instruction;Functional mobility training;Patient/family education;UE/LE Strength taining/ROM;UE/LE Coordination activities;Therapeutic Activities;Therapeutic Exercise;Stair training;Visual/perceptual remediation/compensation;Wheelchair propulsion/positioning PT Transfers Anticipated Outcome(s): supervision >CGA with LRAD PT Locomotion Anticipated Outcome(s): CGA with LRAD PT Recommendation Recommendations for Other Services: Speech consult;Neuropsych consult Follow Up Recommendations: Home health PT;24 hour supervision/assistance Patient destination: Home Equipment Recommended: To be determined  Skilled Therapeutic Intervention Patient in supine in enclosure bed.  NT's in room assisting with breakfast meal.  Patient refused further food and agreeable to participate in PT evaluation.  Patient rolled in bed to R with mod A and increased time cues for technique due to pt c/o back pain.  Assisted side to sit with max A for legs, trunk and to achieve balance at EOB after assisted to scoot forward.  Patient S for static balance once at EOB.  Assisted to stand and pivot to w/c with mod A and max cues for technique pt with increased time due to difficulty with motor planning.  In w/c, pt propelled 30' with max cues, increased time and min A for direction as pt with impaired vision.  Patient assisted in w/c to gym.  Sit to stand min to mod A for balance without device.  Noted brief falling down so assisted to don pants (max A).  Gait x 60' mod A+1-2 with RN helping some due to pt with decreased balance and broad based gait along with flexed knees and hips reported felt as if they would give out.  Gait with RW and min  to mod A for balance and safety with walker  and for direction around obstacles in the gym x another 60'.  PT c/o HA so Wheeled to room to obtain pt's glasses with R nasal partial occlusion to aide in diplopia pt reported slightly better. RN made aware of pt request for medication for HA.  Performed car transfer max A increased time, pt unable to pivot hips in car or bring feet in or out without assist due to increased time and difficulty planning.  Patient negotiated 8 3" steps with min to mod A for balance, safety due to decreased vision.  Assisted to room in w/c and left with alarm belt and RN in room who will take to nursing station prior to next session.   PT Evaluation Precautions/Restrictions Precautions Precautions: Fall Restrictions Weight Bearing Restrictions: No Pain Pain Assessment Pain Scale: 0-10 Pain Score: 4  Pain Location: Head Pain Orientation: Posterior;Mid Pain Descriptors / Indicators: Headache Patients Stated Pain Goal: 2 Pain Intervention(s): Medication (See eMAR) Home Living/Prior Functioning Home Living Available Help at Discharge: Family;Available 24 hours/day Type of Home: House Home Access: Stairs to enter [employer] of Steps: 3 Entrance Stairs-Rails: Right;Left Home Layout: Two level;Able to live on main level with bedroom/bathroom Alternate Level Stairs-Number of Steps: 3 steps, landing and then 12 steps Alternate Level Stairs-Rails: Left Bathroom Shower/Tub: Multimedia programmer: Standard Bathroom Accessibility: Yes Additional Comments: can set up bedroom downstairs  Lives With: Spouse Prior Function Level of Independence: Independent with basic ADLs;Independent with homemaking with ambulation  Able to Take Stairs?: Yes Driving: Yes Vocation: Retired Leisure: Hobbies-yes (Comment) Comments:  Retired Airline pilot. Hobbies include woodworking, welding, watching Jeopardy/History Channel Vision/Perception  Perception Perception:  Impaired Inattention/Neglect: Impaired-to be further tested in functional context Praxis Praxis: Impaired Praxis Impairment Details: Ideation;Initiation;Ideomotor;Motor planning;Perseveration  Cognition Overall Cognitive Status: Impaired/Different from baseline Arousal/Alertness: Awake/alert Orientation Level: Oriented to person;Oriented to situation;Disoriented to place;Disoriented to time Focused Attention: Appears intact Memory: Impaired Memory Impairment: Decreased recall of new information;Decreased short term memory Awareness: Impaired Problem Solving: Impaired Safety/Judgment: Impaired Sensation Sensation Light Touch: Appears Intact Additional Comments: reports intact to light touch Coordination Gross Motor Movements are Fluid and Coordinated: No Fine Motor Movements are Fluid and Coordinated: No Coordination and Movement Description: ataxia, decreased smoothness and accuracy of fine motor and gross motor movements Motor  Motor Motor: Ataxia;Motor apraxia;Motor perseverations Motor - Skilled Clinical Observations: ataxic with fine movements and as broad based gait, perseverates with visual hallucinations of tieing or handling pills Motor - Discharge Observations: generalized deconditioning  Mobility Bed Mobility Bed Mobility: Rolling Right;Rolling Left;Right Sidelying to Sit;Left Sidelying to Sit;Sit to Sidelying Left Rolling Right: Moderate Assistance - Patient 50-74% Rolling Left: Minimal Assistance - Patient > 75% Right Sidelying to Sit: Maximal Assistance - Patient 25-49%(from enclosure bed) Left Sidelying to Sit: Minimal Assistance - Patient >75%(from mat in gym) Sit to Sidelying Left: Moderate Assistance - Patient 50-74%(for feet onto bed with apraxia, increased time) Transfers Transfers: Sit to Stand;Stand to Sit;Stand Pivot Transfers Sit to Stand: Minimal Assistance - Patient > 75%;Moderate Assistance - Patient 50-74% Stand to Sit: Minimal Assistance - Patient >  75% Stand Pivot Transfers: Minimal Assistance - Patient > 75%;Moderate Assistance - Patient 50 - 74% Stand Pivot Transfer Details (indicate cue type and reason): used RW for one transfer with min A, without walker mod support needed Transfer (Assistive device): Rolling walker Locomotion  Gait Ambulation: Yes Gait Assistance: Minimal Assistance - Patient > 75%;Moderate Assistance - Patient 50-74%;2 Helpers Gait Distance (Feet): 50 Feet Assistive device: None;Rolling walker Gait Assistance  Details: with walker and min A without walker mod A to 2 helpers Gait Gait Pattern: Trunk flexed;Shuffle;Step-through pattern;Decreased stride length;Right flexed knee in stance Stairs / Additional Locomotion Stairs Assistance: Moderate Assistance - Patient 50 - 74% Stair Management Technique: Two rails Number of Stairs: 8 Height of Stairs: 3 Wheelchair Mobility Wheelchair Mobility: Yes Wheelchair Assistance: Minimal assistance - Patient >75% Wheelchair Propulsion: Both upper extremities Wheelchair Parts Management: Needs assistance Distance: 40'  Trunk/Postural Assessment  Thoracic Assessment Thoracic Assessment: Exceptions to WFL(kyphosis with rounded shoulders) Lumbar Assessment Lumbar Assessment: Exceptions to WFL(PPT) Postural Control Postural Control: Deficits on evaluation Righting Reactions: impaired/delayed Protective Responses: impaired/delayed  Balance Balance Balance Assessed: Yes Static Sitting Balance Static Sitting - Balance Support: Feet supported Static Sitting - Level of Assistance: 5: Stand by assistance Dynamic Sitting Balance Dynamic Sitting - Balance Support: Feet supported Dynamic Sitting - Level of Assistance: 3: Mod assist Static Standing Balance Static Standing - Balance Support: During functional activity Static Standing - Level of Assistance: 3: Mod assist Dynamic Standing Balance Dynamic Standing - Balance Support: During functional activity;No upper  extremity supported Dynamic Standing - Level of Assistance: 3: Mod assist;1: +2 Total assist Dynamic Standing - Comments: gait without device Extremity Assessment      RLE Assessment Active Range of Motion (AROM) Comments: WFL General Strength Comments: at least 4/5 hip flexion, knee extension and ankle DF LLE Assessment Active Range of Motion (AROM) Comments: WFL General Strength Comments: at least 4/5 hip flexion, knee extension and ankle DF    Refer to Care Plan for Long Term Goals  Recommendations for other services: Neuropsych  Discharge Criteria: Patient will be discharged from PT if patient refuses treatment 3 consecutive times without medical reason, if treatment goals not met, if there is a change in medical status, if patient makes no progress towards goals or if patient is discharged from hospital.  The above assessment, treatment plan, treatment alternatives and goals were discussed and mutually agreed upon: by patient  Reginia Naas 09/05/2018, 12:43 PM

## 2018-09-05 NOTE — Progress Notes (Signed)
Called to room by pt. Pt drowsy and hard to awaken. Responsive to name calling however sleeping sitting in chair. VSS. Assisted back to bed +2 assist. Pt able to arouse and stand with assistance to transfer from chair back to bed. I+O cath 2/2 urinary retention and no void since 0500. Tol procedure well. Once back in bed, more arouseable but sleepy. Jason Farrell

## 2018-09-05 NOTE — Progress Notes (Signed)
Initial Nutrition Assessment  DOCUMENTATION CODES:   Obesity unspecified  INTERVENTION:  Discontinue Glucerna Ensure Max BID, each supplement provides 150 kcal and 30 grams protein Unjury (per RN request; if available) each supplement provides 100 kcal and 15 grams protein Magic cup TID with meals, each supplement provides 290 kcal and 9 grams of protein    NUTRITION DIAGNOSIS:   Increased nutrient needs related to wound healing as evidenced by estimated needs.   GOAL:   Patient will meet greater than or equal to 90% of their needs   MONITOR:   PO intake, Weight trends, Supplement acceptance, I & O's  REASON FOR ASSESSMENT:   Malnutrition Screening Tool    ASSESSMENT:  78 year old male with PMH of CAD, HTN, OSA-CPAP, T2DM, GERD, MI, AMS, difficulty speaking s/p recent craniotomy w/ EVD placement for meningioma    Patient being placed in bed by 2 RN after falling asleep in wheelchair s/p PT. Patient sleeping soundly, RD spoke with RN who reports patient has poor PO; eating bites at mealtime. He fell asleep prior to drinking ONS this morning. RN confirms patient has stage II pressure injury to his mid sacrum and MASD. RD suggested changing ONS to Ensure Max and adding pro-stat. RN requested Unjury over [employer]. RD informed RN of formulary change and will provide if still available.   1/26-2/13  @ [facility]  2/13 CIR  Medications reviewed and include: Colace,  Glipizide, SSI, MVI, Protonix, Potassium chloride 46mEq tablet, Mag-Ox  Labs: K 3.3 (L) replacing Glucose 182 (H) Mg 1.6 (L) replacing  NUTRITION - FOCUSED PHYSICAL EXAM: Unable to complete at this time Previous NFPE on 2/19 - no deficits noted  Diet Order:  33% of 2 recorded meals Diet Order            Diet heart healthy/carb modified Room service appropriate? Yes; Fluid consistency: Thin  Diet effective now              EDUCATION NEEDS:   Not appropriate for education at this time  Skin:  Skin  Assessment: Skin Integrity Issues:(incision (closed) head) Skin Integrity Issues:: Stage II Stage II: sacrum Other: MASD groin  Last BM:  2/18  Height:   Ht Readings from Last 1 Encounters:  09/04/18 5\' 6"  (1.676 m)    Weight:   Wt Readings from Last 1 Encounters:  09/04/18 86 kg   09/04/18 86 kg  09/04/18 86 kg  08/28/18 91.3 kg  07/21/18 99.8 kg  07/21/18 100.6 kg  07/04/18 101.8 kg     Ideal Body Weight:  64.5 kg  BMI:  Body mass index is 30.6 kg/m.  Estimated Nutritional Needs:   Kcal:  1930-2160  Protein:  111-120 grams  Fluid:  1.9 L/day    Lajuan Lines, RD, LDN  After Hours/Weekend Pager: [phone]

## 2018-09-05 NOTE — Progress Notes (Signed)
Pt slept for 15 min. Much more alert and back to baseline from this morning. Apologized for being so sleepy and not participating in therapy but he was "worn out". Able to take in supplement and answers questions promptly. Margarito Liner

## 2018-09-05 NOTE — Significant Event (Signed)
Hypoglycemic Event  CBG: 67  Treatment: 4 oz juice/soda  Symptoms: None  Follow-up CBG: Time:2238 CBG Result:101  Possible Reasons for Event: unknown  Comments/MD notified:Dr. Orland Mustard Raymond Gurney

## 2018-09-05 NOTE — Evaluation (Signed)
Speech Language Pathology Assessment and Plan  Patient Details  Name: Jason Farrell. MRN: [id number] Date of Birth: 06/05/1941  SLP Diagnosis: Cognitive Impairments  Rehab Potential: Good ELOS: 21-24 days     Today's Date: 09/05/2018 SLP Individual Time: 1300-1400 SLP Individual Time Calculation (min): 60 min   Problem List:  Patient Active Problem List   Diagnosis Date Noted  . Pressure injury of skin 09/02/2018  . PRES (posterior reversible encephalopathy syndrome) 08/30/2018  . Acute blood loss anemia   . Hypoalbuminemia due to protein-calorie malnutrition ([place])   . Hypokalemia   . Leukocytosis   . Encephalopathy   . SVT (supraventricular tachycardia) ([place])   . 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 Debility   . Meningioma ([place])   . 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 Senile purpura ([place]) 02/21/2018  . Type 2 diabetes mellitus with microalbuminuria, without long-term current use of insulin ([place]) 02/21/2018  . Controlled type 2 diabetes mellitus without complication, without long-term current use of insulin ([place]) 11/11/2017  . 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 Obstructive sleep apnea on CPAP 12/09/2015  . Acid reflux 05/06/2015  . Arthritis, degenerative 05/06/2015  . CAD in native artery 01/25/2015  . Diabetes mellitus type 2 in obese ([place]) 08/16/2014  . Hyperlipidemia 08/16/2014  . Essential hypertension 08/16/2014  . Morbid obesity ([place]) 08/16/2014  . Stopped smoking with greater than 30 pack year history 08/16/2014  . COLD (chronic obstructive lung disease) ([place]) 08/16/2014   Past Medical History:  Past Medical History:  Diagnosis Date  . Coronary artery disease   . Diabetes mellitus without complication ([place])   . Dyslipidemia   . GERD (gastroesophageal reflux disease)   . Hyperlipidemia   . Hypertension   . MI (myocardial infarction) ([place])   . Osteoarthritis    Past Surgical History:  Past Surgical History:   Procedure Laterality Date  . CARDIAC CATHETERIZATION  2006   stent placement  . COLONOSCOPY    . CORONARY ANGIOPLASTY WITH STENT PLACEMENT  2006   stent x 2   . CORONARY ANGIOPLASTY WITH STENT PLACEMENT  1996   stent x 3   . FOOT SURGERY Right   . HALLUX VALGUS CHEILECTOMY Right 02/25/2015   Procedure: HALLUX VALGUS CHEILECTOMY;  Surgeon: [name], DPM;  Location: [facility] ORS;  Service: Podiatry;  Laterality: Right;    Assessment / Plan / Recommendation Clinical Impression Patient is a 78 year old male with history of CAD, HTN, OSA- CPAP, history of intermittent confusion, difficulty speaking and visual changes since 06/2018 with work up revealing large meningioma at level of cerebral aqueduct with vasogenic edema posterior medial temporal lobes and obstructive hydrocephalus.He underwent craniotomy with resection of pineal region mass and placement of EVD on 08/11/2018 by Drs. MRI at [facility]. Postop course significant for decrease in level of consciousness due to IVH with focal hemorrhage and clot in right ventricle. CTA head/neck done revealing narrowed/diminished flow in right V4 segment question due to dissection, thrombus or vasospasm. Neurology consulted for input and felt that delirium was responsible for patient's fluctuating mental status with confusion and agitation. He is also had loss of bilateral vision and ophthalmology felt that visual loss was cortical in nature likely due to PR ES over PROM. He developed increasing lethargy with leukocytosis on 02/07 and work-up was negative for infection. He did have episode of SVT on 2/11 and beta-blocker with resolution. He continues to require Seroquel to help with sleep-wake disruption as well as Zyprexa to  manage agitation. He was admitted to CIR on 08/28/2018 for intensive rehab to consist of PT,OT and ST at least 3 hours 5 days a week. Sleep hygiene was improving and he was tolerating the therapy. He did have recurrent SVT on 2/17  and was started on IV amiodarone and BB titrated upwards. 2D echo done revealing EF 55% with moderate increase in LV wall and trivial AVR. Follow up CT head done due to ongoing issues with confusion and agitation. 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 Leonel Ramsay felt that patient likely with perioperative stroke with subsequent multifactorial delirium and to treat with supportive care.MBS done revealing functional swallow with minimal penetration and to continue regular texture/thins. EEG done for work up and was normal study without epileptiform activity. Heart rate currently controlled.recommended decreasing amiodarone to daily and follow-up with Dr. Rockey Situ on outpatient basis.Therapy reevaluation completed revealing patient with apraxia, cognitive deficits as well as balance deficits affecting ADLs as well as mobility. He was cleared to resume CIR program and admitted 09/04/18.  Patient demonstrates severe cognitive impairments characterized by impaired orientation, focused attention, functional problem solving, recall of functional information and  intellectual awareness of deficits which impacts his safety with functional and familiar tasks. Function is also impacted by severe visual deficits, restlessness and lethargy. Patient appears less confused compared to initial evaluation, however, continued language of confusion noted.  Patient consumed a snack of regular textures and demonstrated efficient mastication and completed oral clearance without overt s/s fo aspiration. Patient also consumed thin liquids via cup and straw without overt s/s of aspiration. Recommend patient continue current diet with full supervision due to cognitive and visual deficits.  Patient would benefit from skilled SLP intervention in order to maximize his cognitive functioning  and overall functional independence prior to dishcarge.    Skilled Therapeutic Interventions          Administered a cognitive-linguistic evaluation and BSE, please see above for details. Educated patient's wife and daughter in regards to his current cognitive deficits and goals of skilled SLP intervention. Both verbalized understanding and agreement.    SLP Assessment  Patient will need skilled Speech Lanaguage Pathology Services during CIR admission    Recommendations  SLP Diet Recommendations: Age appropriate regular solids;Thin Liquid Administration via: Cup;Straw Medication Administration: Whole meds with liquid Supervision: Staff to assist with self feeding;Full supervision/cueing for compensatory strategies Compensations: Slow rate;Small sips/bites;Minimize environmental distractions Postural Changes and/or Swallow Maneuvers: Seated upright 90 degrees Oral Care Recommendations: Oral care BID Recommendations for Other Services: Neuropsych consult Patient destination: Home Follow up Recommendations: 24 hour supervision/assistance;Home Health SLP Equipment Recommended: None recommended by SLP    SLP Frequency 3 to 5 out of 7 days   SLP Duration  SLP Intensity  SLP Treatment/Interventions 21-24 days   Minumum of 1-2 x/day, 30 to 90 minutes  Cognitive remediation/compensation;Environmental controls;Internal/external aids;Therapeutic Activities;Patient/family education;Functional tasks;Cueing hierarchy    Pain No/Denies Pain    Short Term Goals: Week 1: SLP Short Term Goal 1 (Week 1): Patient will demonstrate sustaind attention to tasks for ~5 minutes with Mod A verbal cus for redirection.  SLP Short Term Goal 2 (Week 1): Patient will orient to place, month and situation with Max A multimodal cues.  SLP Short Term Goal 3 (Week 1): Patient will verbalize 2 physical/cognitive changes since hospitalization with Max A multomodal cues.  SLP Short Term Goal 4 (Week 1):  Patient will  demonstrate functional problem solving during very basic ADL tasks with Max A multimodal cues.   Refer to Care Plan for Long Term Goals  Recommendations for other services: Neuropsych  Discharge Criteria: Patient will be discharged from SLP if patient refuses treatment 3 consecutive times without medical reason, if treatment goals not met, if there is a change in medical status, if patient makes no progress towards goals or if patient is discharged from hospital.  The above assessment, treatment plan, treatment alternatives and goals were discussed and mutually agreed upon: by patient and by family  Persephone Schriever 09/05/2018, 4:04 PM

## 2018-09-05 NOTE — Progress Notes (Signed)
RT entered pt room to place pt on CPAP dream station for the night and found pts CPAP tubing and mask torn into several pieces. Unable to keep pt from destroying another mask and tubing. RT will continue to monitor.

## 2018-09-05 NOTE — Progress Notes (Signed)
Pt ripped off CPAP and broke the tubing. Cooperative and calm with staff.

## 2018-09-06 ENCOUNTER — Inpatient Hospital Stay (HOSPITAL_COMMUNITY): Payer: Managed Care, Other (non HMO) | Admitting: Physical Therapy

## 2018-09-06 ENCOUNTER — Inpatient Hospital Stay (HOSPITAL_COMMUNITY): Payer: Managed Care, Other (non HMO) | Admitting: Occupational Therapy

## 2018-09-06 ENCOUNTER — Inpatient Hospital Stay (HOSPITAL_COMMUNITY): Payer: Managed Care, Other (non HMO) | Admitting: Speech Pathology

## 2018-09-06 LAB — BASIC METABOLIC PANEL WITH GFR
Anion gap: 12 (ref 5–15)
BUN: 16 mg/dL (ref 8–23)
CO2: 29 mmol/L (ref 22–32)
Calcium: 8.9 mg/dL (ref 8.9–10.3)
Chloride: 102 mmol/L (ref 98–111)
Creatinine, Ser: 0.89 mg/dL (ref 0.61–1.24)
GFR calc Af Amer: >60 mL/min (ref >60)
GFR calc non Af Amer: >60 mL/min (ref >60)
Glucose, Bld: 167 mg/dL — ABNORMAL HIGH (ref 70–99)
Potassium: 4 mmol/L (ref 3.5–5.1)
Sodium: 143 mmol/L (ref 135–145)

## 2018-09-06 LAB — GLUCOSE, CAPILLARY
GLUCOSE-CAPILLARY: 170 mg/dL — AB (ref 70–99)
Glucose-Capillary: 201 mg/dL — ABNORMAL HIGH (ref 70–99)
Glucose-Capillary: 145 mg/dL — ABNORMAL HIGH (ref 70–99)
Glucose-Capillary: 109 mg/dL — ABNORMAL HIGH (ref 70–99)

## 2018-09-06 LAB — MAGNESIUM: Magnesium: 1.7 mg/dL (ref 1.7–2.4)

## 2018-09-06 NOTE — Progress Notes (Signed)
Occupational Therapy Session Note  Patient Details  Name: Jason Farrell. MRN: [id number] Date of Birth: September 17, 1940  Today's Date: 09/06/2018 OT Individual Time: 4132-4401 OT Individual Time Calculation (min): 69 min    Short Term Goals: Week 1:  OT Short Term Goal 1 (Week 1): Pt will locate 1 grooming item on the sink with mod questioning cues OT Short Term Goal 2 (Week 1): Pt will complete toilet transfer with mod A of 1 OT Short Term Goal 3 (Week 1): Pt will complete 1 step of LB dressing activity  with mod instructional cues OT Short Term Goal 4 (Week 1): Pt will complete 1 step of toothbrushing activity with mod instructional cues  Skilled Therapeutic Interventions/Progress Updates: main highlights of the patient's participation in skilled OT session are as follows:  Patient asleep upon offer for OT -bed to w/c transfer=Min A and extra time to process directional moves -W/c to/fr 3:1 toilet transfer and via grab bar (3:1 over bathroom toilet)= min A and extra time for patient to visually locate grab bar and process instruction - toileting, (patient wearing hospital gown and brief)= Min A to maintain balance and wipe buttocks  - Oral care.   Patient with difficulty locating toothbrush, paste and hot and cold water levers (appeared to not seen them clearly and intitially to overshoot or underreach).   He stated he could see fine.  With extra time, he was able to intiate the task and eventually locate brush and properly apply paste and turn on & off the water levers.       -Bathing and dressing in w/c at sink with overall extra time and moderate assistance, especially for topographical orientation to shirt and tactile cues for hemidressing techniques.  He donned his pants with extra time and moderate verbal cues for hemi dressing technique and cues to don correct leg into correct pant leg.  At the end of the session, he was left seated in his w/c with alarm belt  And call bell in  place.  Continue OT Plan of Care.   Therapy Documentation Precautions:  Precautions Precautions: Fall Restrictions Weight Bearing Restrictions: No  Pain: Pain Assessment Pain Score: 0-No pain Therapy/Group: Individual Therapy  Alfredia Ferguson [facility] 09/06/2018, 4:13 PM

## 2018-09-06 NOTE — Progress Notes (Signed)
Physical Therapy Session Note  Patient Details  Name: Jason Mclaurin Jr. MRN: [id number] Date of Birth: 06/20/1941  Today's Date: 09/06/2018 PT Individual Time: 1615-1645 PT Individual Time Calculation (min): 30 min   Short Term Goals: Week 1:  PT Short Term Goal 1 (Week 1): PAtient will consistently demonstrate bed mobility with mod A PT Short Term Goal 2 (Week 1): Patient to ambulate 100' min A PT Short Term Goal 3 (Week 1): Patient will negotiate 4 6" steps with rails min A  Skilled Therapeutic Interventions/Progress Updates:   Pt received supine in bed and agreeable to PT. Supine>sit transfer with max assist assist to arouse from sleep. Stand pivot transfer with mod assist to initiate task. Pt transported to day room in WC. Stand pivot transfer to Nutep with min assist from PT and assist for proper UE placement to arm rest of nustep. nustep reciprocal movement and endurance training x 8 minutes with continuous cues for increased amplitude and size of movement to improved knee extension. Gait training with RW x 100ft and min assist from PT for AD management and min cues for step length. Patient returned to room and left sitting in WC with call bell in reach, NT present, and all needs met.          Therapy Documentation Precautions:  Precautions Precautions: Fall Restrictions Weight Bearing Restrictions: No Vital Signs: Therapy Vitals Temp: 98.8 F (37.1 C) Temp Source: Oral Pulse Rate: (!) 102 Resp: 18 BP: (!) 155/75 Patient Position (if appropriate): Lying Oxygen Therapy SpO2: 100 % O2 Device: CPAP Pain: denies   Therapy/Group: Individual Therapy   E  09/06/2018, 5:26 PM  

## 2018-09-06 NOTE — Progress Notes (Signed)
Physical Therapy Session Note  Patient Details  Name: Jason Farrell. MRN: [id number] Date of Birth: June 14, 1941  Today's Date: 09/06/2018 PT Individual Time: 1100-1155 PT Individual Time Calculation (min): 55 min   Short Term Goals: Week 1:  PT Short Term Goal 1 (Week 1): PAtient will consistently demonstrate bed mobility with mod A PT Short Term Goal 2 (Week 1): Patient to ambulate 100' min A PT Short Term Goal 3 (Week 1): Patient will negotiate 4 6" steps with rails min A  Skilled Therapeutic Interventions/Progress Updates:  Pt was seen sitting up in w/c at nurses station. Pt transported to rehab gym. Pt performed multiple sit to stand transfers with c/g and verbal cues. Pt performed step taps and alternating step taps 3 sets x 10 reps each for NMR. Pt ambulated 80 feet with rolling walker and min to mod A with verbal cues. Pt ambulated 80 feet without assistive device and mod A with verbal cues. Pt returned to room at end of treatment and left sitting up in w/c with chair alarm on and nurse tech at bedside.   Therapy Documentation Precautions:  Precautions Precautions: Fall Restrictions Weight Bearing Restrictions: No General:    Pain: Pt c/o mild headache.   Therapy/Group: Individual Therapy  Dub Amis 09/06/2018, 12:34 PM

## 2018-09-06 NOTE — Progress Notes (Signed)
Jason Farrell. is a 78 y.o. male who is admitted for CIR with functional deficit secondary to meningioma/IVH  Past Medical History:  Diagnosis Date  . Coronary artery disease   . Diabetes mellitus without complication ([place])   . Dyslipidemia   . GERD (gastroesophageal reflux disease)   . Hyperlipidemia   . Hypertension   . MI (myocardial infarction) ([place])   . Osteoarthritis      Subjective: No new complaints. No new problems.  Alert.  Remains in enclosure bed  Objective: Vital signs in last 24 hours: Temp:  [98 F (36.7 C)] 98 F (36.7 C) (02/22 0412) Pulse Rate:  [89-106] 89 (02/22 0412) Resp:  [14-19] 19 (02/22 0412) BP: (98-141)/(63-90) 135/90 (02/22 0412) SpO2:  [96 %-100 %] 96 % (02/22 0412) Weight change:  Last BM Date: 09/02/18  Intake/Output from previous day: 02/21 0701 - 02/22 0700 In: 600 [P.O.:600] Out: 550 [Urine:550] Last cbgs: CBG (last 3)  Recent Labs    09/05/18 2202 09/05/18 2238 09/06/18 0618  GLUCAP 67* 101* 170*   Patient Vitals for the past 24 hrs:  BP Temp Temp src Pulse Resp SpO2  09/06/18 0412 135/90 98 F (36.7 C) Oral 89 19 96 %  09/05/18 1925 138/81 98 F (36.7 C) Oral (!) 103 19 100 %  09/05/18 1352 98/63 - - (!) 102 14 100 %  09/05/18 1059 (!) 141/74 - - (!) 106 - 97 %   Lab Results  Component Value Date   HGBA1C 8.7 (A) 07/04/2018    Physical Exam General: No apparent distress   HEENT: not dry Lungs: Normal effort. Lungs clear to auscultation, no crackles or wheezes. Cardiovascular: Regular rate and rhythm, no edema Abdomen: S/NT/ND; BS(+) Musculoskeletal:  unchanged Neurological: No new neurological deficits.  Moves all extremities.  Poor concentration and attention Wounds: N/A    Skin: clear   Mental state: Alert easily distracted and slightly restless    Lab Results: BMET    Component Value Date/Time   NA 143 09/06/2018 0542   NA 141 01/25/2015 0853   K 4.0 09/06/2018 0542   CL 102 09/06/2018 0542    CO2 29 09/06/2018 0542   GLUCOSE 167 (H) 09/06/2018 0542   BUN 16 09/06/2018 0542   BUN 16 01/25/2015 0853   CREATININE 0.89 09/06/2018 0542   CREATININE 1.04 10/22/2017 1141   CALCIUM 8.9 09/06/2018 0542   GFRNONAA >60 09/06/2018 0542   GFRNONAA 70 10/22/2017 1141   GFRAA >60 09/06/2018 0542   GFRAA 82 10/22/2017 1141   CBC    Component Value Date/Time   WBC 7.0 09/05/2018 0545   RBC 4.13 (L) 09/05/2018 0545   HGB 11.9 (L) 09/05/2018 0545   HCT 37.2 (L) 09/05/2018 0545   PLT 322 09/05/2018 0545   MCV 90.1 09/05/2018 0545   MCH 28.8 09/05/2018 0545   MCHC 32.0 09/05/2018 0545   RDW 14.7 09/05/2018 0545   LYMPHSABS 0.8 09/05/2018 0545   MONOABS 0.7 09/05/2018 0545   EOSABS 0.1 09/05/2018 0545   BASOSABS 0.1 09/05/2018 0545    Medications: I have reviewed the patient's current medications.  Assessment/Plan:  Functional deficits in mobility and self-care secondary to meningioma with IVH DVT prophylaxis.  Continue Lovenox History of recurrent SVT.  Presently stable.  Continue amiodarone and metoprolol.  Continue to monitor History of delirium continue Seroquel and monitor Type 2 diabetes mellitus..  Improved glycemic control OSA.  Resume CPAP as tolerated     Length of stay, days: 2  Marletta Lor , MD 09/06/2018, 9:32 AM

## 2018-09-06 NOTE — Progress Notes (Signed)
Pt noted with periods of restlessness at the beginning of the shift, and appears to be experiencing visual hallucinations at times. Remains in enclosure bed for safety. Pt confused, displays no safety awareness, and has difficulty following direction of staff at times. No distress noted. Pt compliant with CPAP for most of the night.

## 2018-09-06 NOTE — Progress Notes (Signed)
Patient is on NIV at this time ?

## 2018-09-06 NOTE — Plan of Care (Signed)
  Problem: Consults Goal: RH BRAIN INJURY PATIENT EDUCATION Description Description: See Patient Education module for eduction specifics Outcome: Progressing   Problem: RH BOWEL ELIMINATION Goal: RH STG MANAGE BOWEL WITH ASSISTANCE Description STG Manage Bowel with mod I Assistance.  Outcome: Progressing   Problem: RH SKIN INTEGRITY Goal: RH STG SKIN FREE OF INFECTION/BREAKDOWN Description Manage skin with min assist   Outcome: Progressing Goal: RH STG MAINTAIN SKIN INTEGRITY WITH ASSISTANCE Description STG Maintain Skin Integrity With min Assistance.  Outcome: Progressing   Problem: RH SAFETY Goal: RH STG ADHERE TO SAFETY PRECAUTIONS W/ASSISTANCE/DEVICE Description STG Adhere to Safety Precautions With cues/reminders Assistance/Device.  Outcome: Progressing

## 2018-09-06 NOTE — Progress Notes (Signed)
Speech Language Pathology Daily Session Note  Patient Details  Name: Jason Farrell. MRN: [id number] Date of Birth: 1940-11-26  Today's Date: 09/06/2018 SLP Individual Time: 1000-1045 SLP Individual Time Calculation (min): 45 min  Short Term Goals: Week 1: SLP Short Term Goal 1 (Week 1): Patient will demonstrate sustaind attention to tasks for ~5 minutes with Mod A verbal cus for redirection.  SLP Short Term Goal 2 (Week 1): Patient will orient to place, month and situation with Max A multimodal cues.  SLP Short Term Goal 3 (Week 1): Patient will verbalize 2 physical/cognitive changes since hospitalization with Max A multomodal cues.  SLP Short Term Goal 4 (Week 1): Patient will demonstrate functional problem solving during very basic ADL tasks with Max A multimodal cues.   Skilled Therapeutic Interventions: Skilled treatment session focused on cognitive goals. Upon arrival, patient was awake in enclosure bed and agreeable to participate. SLP facilitated session by providing extra time and Max A verbal and tactile cues for patient to sit EOB. Total A needed for patient to donn shoes. Patient transferred to the wheelchair and requested to use the bathroom. Extra time and Max A verbal cues needed for sequencing with task. Patient was continent of urine and bowel. SLP also facilitated session by providing Mod A verbal cues for orientation to place, time and situation. Patient left upright in wheelchair at RN station with alarm on. Continue with current plan of care.      Pain Pain Assessment Pain Score: 0-No pain  Therapy/Group: Individual Therapy  Vasiliki Smaldone 09/06/2018, 3:27 PM

## 2018-09-07 ENCOUNTER — Inpatient Hospital Stay (HOSPITAL_COMMUNITY): Payer: Managed Care, Other (non HMO) | Admitting: Speech Pathology

## 2018-09-07 LAB — GLUCOSE, CAPILLARY
Glucose-Capillary: 141 mg/dL — ABNORMAL HIGH (ref 70–99)
Glucose-Capillary: 142 mg/dL — ABNORMAL HIGH (ref 70–99)
Glucose-Capillary: 158 mg/dL — ABNORMAL HIGH (ref 70–99)
Glucose-Capillary: 152 mg/dL — ABNORMAL HIGH (ref 70–99)

## 2018-09-07 LAB — MAGNESIUM: Magnesium: 1.7 mg/dL (ref 1.7–2.4)

## 2018-09-07 NOTE — Progress Notes (Signed)
Jason Farrell. is a 78 y.o. male who was admitted for CIR with debility and functional deficits secondary to meningioma and IVH  Past Medical History:  Diagnosis Date  . Coronary artery disease   . Diabetes mellitus without complication ([place])   . Dyslipidemia   . GERD (gastroesophageal reflux disease)   . Hyperlipidemia   . Hypertension   . MI (myocardial infarction) ([place])   . Osteoarthritis      Subjective: No new complaints. No new problems.  Doing well without concerns.  Remains in an enclosure bed.  Objective: Vital signs in last 24 hours: Temp:  [98.4 F (36.9 C)-98.8 F (37.1 C)] 98.5 F (36.9 C) (02/23 0431) Pulse Rate:  [97-109] 98 (02/23 0816) Resp:  [16-18] 16 (02/23 0816) BP: (145-155)/(75-90) 150/90 (02/23 0816) SpO2:  [97 %-100 %] 97 % (02/23 0816) Weight change:  Last BM Date: 09/06/18  Intake/Output from previous day: 02/22 0701 - 02/23 0700 In: 720 [P.O.:720] Out: 335 [Urine:335] Last cbgs: CBG (last 3)  Recent Labs    09/06/18 1649 09/06/18 2121 09/07/18 0619  GLUCAP 145* 109* 141*   Patient Vitals for the past 24 hrs:  BP Temp Temp src Pulse Resp SpO2  09/07/18 0816 (!) 150/90 - - 98 16 97 %  09/07/18 0431 (!) 155/90 98.5 F (36.9 C) - (!) 109 16 97 %  09/06/18 2034 (!) 145/76 98.4 F (36.9 C) - 97 16 98 %  09/06/18 1408 (!) 155/75 98.8 F (37.1 C) Oral (!) 102 18 100 %     Physical Exam General: No apparent distress alert obese HEENT: CPAP mask in place Lungs: Normal effort. Lungs clear to auscultation, no crackles or wheezes. Cardiovascular: Regular rate and rhythm, no edema Abdomen: S/NT/ND; BS(+) Musculoskeletal:  unchanged Neurological: No new neurological deficits Wounds: N/A    Skin: clear   Mental state: Alert, oriented, cooperative; slightly distracted and inattentive    Lab Results: BMET    Component Value Date/Time   NA 143 09/06/2018 0542   NA 141 01/25/2015 0853   K 4.0 09/06/2018 0542   CL 102  09/06/2018 0542   CO2 29 09/06/2018 0542   GLUCOSE 167 (H) 09/06/2018 0542   BUN 16 09/06/2018 0542   BUN 16 01/25/2015 0853   CREATININE 0.89 09/06/2018 0542   CREATININE 1.04 10/22/2017 1141   CALCIUM 8.9 09/06/2018 0542   GFRNONAA >60 09/06/2018 0542   GFRNONAA 70 10/22/2017 1141   GFRAA >60 09/06/2018 0542   GFRAA 82 10/22/2017 1141   CBC    Component Value Date/Time   WBC 7.0 09/05/2018 0545   RBC 4.13 (L) 09/05/2018 0545   HGB 11.9 (L) 09/05/2018 0545   HCT 37.2 (L) 09/05/2018 0545   PLT 322 09/05/2018 0545   MCV 90.1 09/05/2018 0545   MCH 28.8 09/05/2018 0545   MCHC 32.0 09/05/2018 0545   RDW 14.7 09/05/2018 0545   LYMPHSABS 0.8 09/05/2018 0545   MONOABS 0.7 09/05/2018 0545   EOSABS 0.1 09/05/2018 0545   BASOSABS 0.1 09/05/2018 0545     Medications: I have reviewed the patient's current medications.  Assessment/Plan:  Functional deficits secondary to meningioma with IVH.  Continue CIR History recurrent SVT.  Continue amiodarone and metoprolol DVT prophylaxis continue Lovenox History of delirium.  Stable Type 2 diabetes.  Reasonable glycemic control OSA.  Continue nocturnal CPAP    Length of stay, days: 3  Marletta Lor , MD 09/07/2018, 9:00 AM

## 2018-09-07 NOTE — Progress Notes (Signed)
Per RN she placed patient on CPAP. Requested this RT follow up.  Patient is currently resting on CPAP.

## 2018-09-08 ENCOUNTER — Inpatient Hospital Stay (HOSPITAL_COMMUNITY): Payer: Managed Care, Other (non HMO) | Admitting: Physical Therapy

## 2018-09-08 ENCOUNTER — Inpatient Hospital Stay (HOSPITAL_COMMUNITY): Payer: Managed Care, Other (non HMO) | Admitting: Speech Pathology

## 2018-09-08 ENCOUNTER — Inpatient Hospital Stay (HOSPITAL_COMMUNITY): Payer: Managed Care, Other (non HMO) | Admitting: Occupational Therapy

## 2018-09-08 DIAGNOSIS — R0989 Other specified symptoms and signs involving the circulatory and respiratory systems: Secondary | ICD-10-CM

## 2018-09-08 DIAGNOSIS — G4733 Obstructive sleep apnea (adult) (pediatric): Secondary | ICD-10-CM

## 2018-09-08 DIAGNOSIS — E46 Unspecified protein-calorie malnutrition: Secondary | ICD-10-CM

## 2018-09-08 DIAGNOSIS — E119 Type 2 diabetes mellitus without complications: Secondary | ICD-10-CM

## 2018-09-08 DIAGNOSIS — I10 Essential (primary) hypertension: Secondary | ICD-10-CM

## 2018-09-08 LAB — GLUCOSE, CAPILLARY
Glucose-Capillary: 174 mg/dL — ABNORMAL HIGH (ref 70–99)
Glucose-Capillary: 262 mg/dL — ABNORMAL HIGH (ref 70–99)
Glucose-Capillary: 143 mg/dL — ABNORMAL HIGH (ref 70–99)
Glucose-Capillary: 164 mg/dL — ABNORMAL HIGH (ref 70–99)

## 2018-09-08 LAB — MAGNESIUM: Magnesium: 1.7 mg/dL (ref 1.7–2.4)

## 2018-09-08 MED ORDER — INSULIN ASPART 100 UNIT/ML ~~LOC~~ SOLN
0.0000 [IU] | Freq: Three times a day (TID) | SUBCUTANEOUS | Status: DC
  Administered 2018-09-08: 8 [IU] via SUBCUTANEOUS
  Administered 2018-09-08: 2 [IU] via SUBCUTANEOUS
  Administered 2018-09-09 – 2018-09-12 (×10): 3 [IU] via SUBCUTANEOUS
  Administered 2018-09-12: 5 [IU] via SUBCUTANEOUS
  Administered 2018-09-12: 2 [IU] via SUBCUTANEOUS
  Administered 2018-09-13 (×2): 3 [IU] via SUBCUTANEOUS
  Administered 2018-09-14: 2 [IU] via SUBCUTANEOUS
  Administered 2018-09-14: 3 [IU] via SUBCUTANEOUS
  Administered 2018-09-14: 2 [IU] via SUBCUTANEOUS
  Administered 2018-09-15: 3 [IU] via SUBCUTANEOUS
  Administered 2018-09-15: 2 [IU] via SUBCUTANEOUS
  Administered 2018-09-15: 3 [IU] via SUBCUTANEOUS
  Administered 2018-09-16 – 2018-09-17 (×3): 2 [IU] via SUBCUTANEOUS
  Administered 2018-09-17 (×2): 3 [IU] via SUBCUTANEOUS
  Administered 2018-09-18: 2 [IU] via SUBCUTANEOUS

## 2018-09-08 MED ORDER — GLIPIZIDE 5 MG PO TABS
5.0000 mg | ORAL_TABLET | Freq: Two times a day (BID) | ORAL | Status: DC
  Administered 2018-09-08 – 2018-09-11 (×6): 5 mg via ORAL
  Filled 2018-09-08 (×6): qty 1

## 2018-09-08 NOTE — Progress Notes (Signed)
Physical Therapy Session Note  Patient Details  Name: Jason Farrell. MRN: [id number] Date of Birth: 11-27-1940  Today's Date: 09/08/2018 PT Individual Time: 1594-5859 PT Individual Time Calculation (min): 69 min   Short Term Goals: Week 1:  PT Short Term Goal 1 (Week 1): PAtient will consistently demonstrate bed mobility with mod A PT Short Term Goal 2 (Week 1): Patient to ambulate 100' min A PT Short Term Goal 3 (Week 1): Patient will negotiate 4 6" steps with rails min A  Skilled Therapeutic Interventions/Progress Updates:    pt performs sit <> stand with min/mod A with RW, cues for technique.  Gait 150', 75' with RW and CGA.  Pt performs standing tap ups and step ups with RW with CGA for strengthening and endurance.  Tap ups without AD with min/mod A for balance training. Pt c/o Lt knee "grinding" with wt bearing. PT applies ace wrap with pt says "helps a little".  Pt performs standing ball toss and ball kick with mod A to reduce posterior lean.  Seated ab strengthening with mini crunches with mod A due to posterior pelvic tilt and hamstring tightness as well as core weakness. Nustep x 8 minutes level 4 for UE/LE strength and endurance with 1 seated rest break.  Pt left in room with needs at hand, alarm set, family present.  Therapy Documentation Precautions:  Precautions Precautions: Fall Restrictions Weight Bearing Restrictions: No Pain: Pain Assessment Pain Scale: 0-10 Pain Score: 0-No pain    Therapy/Group: Individual Therapy  Osiris Odriscoll 09/08/2018, 11:08 AM

## 2018-09-08 NOTE — Progress Notes (Signed)
[facility] PHYSICAL MEDICINE & REHABILITATION PROGRESS NOTE   Subjective/Complaints: Patient seen laying in enclosure bed this morning.  No reported issues overnight over the weekend.  ROS: Limited due to cognition   Objective:   No results found. No results for input(s): WBC, HGB, HCT, PLT in the last 72 hours. Recent Labs    09/06/18 0542  NA 143  K 4.0  CL 102  CO2 29  GLUCOSE 167*  BUN 16  CREATININE 0.89  CALCIUM 8.9    Intake/Output Summary (Last 24 hours) at 09/08/2018 1008 Last data filed at 09/08/2018 0900 Gross per 24 hour  Intake 238 ml  Output 522 ml  Net -284 ml     Physical Exam: Vital Signs Blood pressure (!) 158/78, pulse 91, temperature 97.7 F (36.5 C), temperature source Oral, resp. rate 18, height 5\' 6"  (1.676 m), weight 86 kg, SpO2 99 %. Constitutional: No distress . Vital signs reviewed. HENT: Normocephalic.  Atraumatic. Eyes: EOMI. No discharge. Cardiovascular: RRR. No JVD. Respiratory: CTA Bilaterally. Normal effort. GI: BS +. Non-distended. Musc: No edema or tenderness in extremities. Neurologic: Alert and oriented  Global aphasia Motor: Limited due to ability to follow commands, however spontaneously moving all extremities Psych: Restless.   Assessment/Plan: 1. 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  Physiatrist is providing close team supervision and 24 hour management of active medical problems listed below.  Physiatrist and rehab team continue to assess barriers to discharge/monitor patient progress toward functional and medical goals  Care Tool:  Bathing    Body parts bathed by patient: Chest, Abdomen   Body parts bathed by helper: Right arm, Left arm, Front perineal area, Buttocks, Right upper leg, Left upper leg, Right lower leg, Left lower leg, Face     Bathing assist Assist Level: Total Assistance - Patient  < 25%     Upper Body Dressing/Undressing Upper body dressing   What is the patient wearing?: Pull over shirt    Upper body assist Assist Level: Maximal Assistance - Patient 25 - 49%    Lower Body Dressing/Undressing Lower body dressing      What is the patient wearing?: Pants, Incontinence brief     Lower body assist Assist for lower body dressing: Total Assistance - Patient < 25%     Toileting Toileting Toileting Activity did not occur (Clothing management and hygiene only): N/A (no void or bm)  Toileting assist Assist for toileting: Moderate Assistance - Patient 50 - 74%     Transfers Chair/bed transfer  Transfers assist     Chair/bed transfer assist level: Minimal Assistance - Patient > 75%     Locomotion Ambulation   Ambulation assist      Assist level: Minimal Assistance - Patient > 75% Assistive device: Walker-rolling Max distance: 159ft   Walk 10 feet activity   Assist     Assist level: Minimal Assistance - Patient > 75% Assistive device: Walker-rolling   Walk 50 feet activity   Assist    Assist level: Minimal Assistance - Patient > 75% Assistive device: Walker-rolling    Walk 150 feet activity   Assist Walk 150 feet activity did not occur: Safety/medical concerns         Walk 10 feet on uneven surface  activity   Assist Walk 10 feet on uneven surfaces activity did not occur: Safety/medical concerns         Wheelchair  Assist Will patient use wheelchair at discharge?: Yes Type of Wheelchair: Manual    Wheelchair assist level: Minimal Assistance - Patient > 75% Max wheelchair distance: 68'    Wheelchair 50 feet with 2 turns activity    Assist    Wheelchair 50 feet with 2 turns activity did not occur: Safety/medical concerns       Wheelchair 150 feet activity     Assist Wheelchair 150 feet activity did not occur: Safety/medical concerns        Medical Problem List and Plan: 1.  Decline in  self care and mobility secondary to meningioma s/p partial resection with IVH  Continue CIR  Notes reviewed, images reviewed- partial resection of tumor, labs reviewed 2. DVT Prophylaxis/Anticoagulation: Pharmaceutical:Lovenox 3. Pain Management:tylenol prn. 4. Mood:LCSW to follow for evaluation and support. 5. Neuropsych: This patientis notcapable of making decisions on hisown behalf. 6. Skin/Wound Care:Routine pressure relief measures. 7. Fluids/Electrolytes/Nutrition:Monitor I/O.    hypokalemia: 4.0 on 2/22, continue supplementation  hypomagnesia: 1.7 on 2/24, continue supplementation  prostat for low albumin  encourage PO 8. Recurrent SVT: Resolved. Continueamiodarone and metoprolol.Continue to monitor HR bid.   9. HTN: ON Lisinopril, Microzide, Metoprolol and Norvasc. Monitor BP bid   Labile on 2/24, monitor for trend 10. Delirium/Hallucinations: Continue seroquel bid and titrate as indicated---should improve in appropriate/controlled environment.   Ativan DC'd  Continue enclosure bed required for safety 11. T2DM:   Pt on glucotrol 5mg  bid ac at home.   Glucotrol 2.5mg  bid ac increased to 5 twice daily on 2/24 12. OSA/Hypoxia: Needed oxygen with activity 2/20. Resume CPAP    Wean oxygen as able.     LOS: 4 days A FACE TO FACE EVALUATION WAS PERFORMED  Ankit Lorie Phenix 09/08/2018, 10:08 AM

## 2018-09-08 NOTE — Progress Notes (Signed)
Occupational Therapy Session Note  Patient Details  Name: Jason Farrell. MRN: [id number] Date of Birth: Aug 29, 1940  Today's Date: 09/08/2018 OT Individual Time: 0830-0940 OT Individual Time Calculation (min): 70 min    Short Term Goals: Week 1:  OT Short Term Goal 1 (Week 1): Pt will locate 1 grooming item on the sink with mod questioning cues OT Short Term Goal 2 (Week 1): Pt will complete toilet transfer with mod A of 1 OT Short Term Goal 3 (Week 1): Pt will complete 1 step of LB dressing activity  with mod instructional cues OT Short Term Goal 4 (Week 1): Pt will complete 1 step of toothbrushing activity with mod instructional cues  Skilled Therapeutic Interventions/Progress Updates:    Pt greeted sitting in wc at the sink brushing his teeth with nurse tech present and agreeable to OT treatment session. Pt alert throughout session and following simple commands without difficulty. Pt able to sequence toothbrushing task without cues from OT. Progressed to bathing at the sink with min verbal cues for thoroughness. Sit<>stand at the sink with min A in preparation to wash peri-area. Once standing, pt reports " I need to pee" so OT provided pt with urinal, although he had already began to urinate. Min A to place urinal and finish voiding in standing. Pt needed min A to maintain balance in standing while reaching to wash buttocks and peri-area. Verbal cues to return to sitting to thread pants with min A. Pt set-up with breakfast tray and worked on visual scanning and B hand coordination to cut pancake and locate items on tray. Pt's family entered room and OT provided pt with cushion for wc. Pt then ambulated 150 ft in hallway with RW and verbal cues to avoid objects on R side- min A overall. Pt returned to room and left seated in wc with safety alarm belt on, family present, and nursing administering medications.   Therapy Documentation Precautions:  Precautions Precautions:  Fall Restrictions Weight Bearing Restrictions: No Pain:  none/denies pain  Therapy/Group: Individual Therapy  Valma Cava 09/08/2018, 9:59 AM

## 2018-09-08 NOTE — Progress Notes (Signed)
Speech Language Pathology Daily Session Note  Patient Details  Name: Jason Farrell. MRN: [id number] Date of Birth: Jan 26, 1941  Today's Date: 09/08/2018 SLP Individual Time: 1415-1500 SLP Individual Time Calculation (min): 45 min  Short Term Goals: Week 1: SLP Short Term Goal 1 (Week 1): Patient will demonstrate sustaind attention to tasks for ~5 minutes with Mod A verbal cus for redirection.  SLP Short Term Goal 2 (Week 1): Patient will orient to place, month and situation with Max A multimodal cues.  SLP Short Term Goal 3 (Week 1): Patient will verbalize 2 physical/cognitive changes since hospitalization with Max A multomodal cues.  SLP Short Term Goal 4 (Week 1): Patient will demonstrate functional problem solving during very basic ADL tasks with Max A multimodal cues.   Skilled Therapeutic Interventions:  Skilled treatment session focused on cognition goals. SLP received pt from nursing with therapy provided in dayroom. Lunch tray had been saved for supervision by SLP (unknown why it was held). SLP facilitated session by providing Max A faded to Mod A to locate items throughout both field of vision. Once location of items such as cup was established, pt then required Mod A cues to locate. With Min A cues, pt was able to recall orientation information. Pt was returned to enclosure bed, left enclosed with all needs within reach. Continue per current plan of care.      Pain Pain Assessment Pain Scale: 0-10 Pain Score: 0-No pain  Therapy/Group: Individual Therapy  Ezio Wieck 09/08/2018, 3:08 PM

## 2018-09-08 NOTE — Progress Notes (Signed)
See below initial assessment report from 08/29/18.  NO changes needed.  Social Work  Social Work Assessment and Plan  Patient Details  Name: Jason Farrell. MRN: [id number] Date of Birth: [birth year]  Today's Date: 08/29/2018  Problem List:      Patient Active Problem List   Diagnosis Date Noted  . PRES (posterior reversible encephalopathy syndrome) 08/30/2018  . Acute blood loss anemia   . Hypoalbuminemia due to protein-calorie malnutrition ([place])   . Hypokalemia   . Leukocytosis   . Encephalopathy   . SVT (supraventricular tachycardia) ([place])   . 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 Debility   . Meningioma ([place])   . 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)   . Senile purpura ([place]) 02/21/2018  . Type 2 diabetes mellitus with microalbuminuria, without long-term current use of insulin ([place]) 02/21/2018  . Controlled type 2 diabetes mellitus without complication, without long-term current use of insulin ([place]) 11/11/2017  . 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 Obstructive sleep apnea on CPAP 12/09/2015  . Acid reflux 05/06/2015  . Arthritis, degenerative 05/06/2015  . CAD in native artery 01/25/2015  . Diabetes mellitus type 2 in obese ([place]) 08/16/2014  . Hyperlipidemia 08/16/2014  . Essential hypertension 08/16/2014  . Morbid obesity ([place]) 08/16/2014  . Stopped smoking with greater than 30 pack year history 08/16/2014  . COLD (chronic obstructive lung disease) ([place]) 08/16/2014   Past Medical History:      Past Medical History:  Diagnosis Date  . Coronary artery disease   . Diabetes mellitus without complication ([place])   . Dyslipidemia   . GERD (gastroesophageal reflux disease)   . Hyperlipidemia   . Hypertension   . MI (myocardial infarction) ([place])   . Osteoarthritis    Past Surgical History:       Past Surgical History:  Procedure Laterality Date  . CARDIAC CATHETERIZATION  2006   stent placement  . COLONOSCOPY     . CORONARY ANGIOPLASTY WITH STENT PLACEMENT  2006   stent x 2   . CORONARY ANGIOPLASTY WITH STENT PLACEMENT  1996   stent x 3   . FOOT SURGERY Right   . HALLUX VALGUS CHEILECTOMY Right 02/25/2015   Procedure: HALLUX VALGUS CHEILECTOMY;  Surgeon: [name], DPM;  Location: [facility] ORS;  Service: Podiatry;  Laterality: Right;   Social History:  reports that he quit smoking about 24 years ago. His smoking use included cigarettes. He started smoking about 59 years ago. He has a 52.50 pack-year smoking history. He has never used smokeless tobacco. He reports current alcohol use of about 14.0 standard drinks of alcohol per week. He reports that he does not use drugs.  Family / Support Systems Marital Status: Married Spouse/Significant Other: (P) wife, Jaxyn Rout @ (C) [phone] Children: (P) daughter, Elie Confer and son, Jenny Reichmann living in [place];  2 sons living in [place] Anticipated Caregiver: (P) wife Ability/Limitations of Caregiver: (P) no limitations Caregiver Availability: (P) 24/7 Family Dynamics: (P) Wife and daughter assisting with completion of assessment and very attentive and encouraging to pt.  Appear devoted to providing any support he may need. They are clearly a calming voice for him.  Social History Preferred language: English Religion: Unknown Cultural Background: (P) NA Read: (P) Yes Write: (P) Yes Employment Status: (P) Retired Public relations account executive Issues: (P) None Guardian/Conservator: (P) None - per MD, pt is not capable of making decisions on his own behalf - defer to spouse.   Abuse/Neglect Abuse/Neglect Assessment Can Be Completed: (P) Unable  to assess, patient is non-responsive or altered mental status Physical Abuse: Denies Verbal Abuse: Denies Sexual Abuse: Denies Exploitation of patient/patient's resources: Denies Self-Neglect: Denies  Emotional Status Pt's affect, behavior and adjustment status: Pt in enclosure bed and  with significant confusion.  Pt does not appear to be in any emotional distress.  Will monitor throughout stay as cognition (hopefully) clears. Recent Psychosocial Issues: None Psychiatric History: None Substance Abuse History: None  Patient / Family Perceptions, Expectations & Goals Pt/Family understanding of illness & functional limitations: Pt's wife and daughter with very good understanding of the medical course pt has been through and of his current cognitive and functional deficits/ need for CIR. Premorbid pt/family roles/activities: Pt was independent overall until this past summer with mobility began to decline. Anticipated changes in roles/activities/participation: Wife fully prepared to continue as primary caregiver. Pt/family expectations/goals: "I just hope this confusion improves!" per wife  [employer]: None Premorbid Home Care/DME Agencies: None Transportation available at discharge: yes Resource referrals recommended: Neuropsychology, Support group (specify)  Discharge Planning Living Arrangements: Spouse/significant other Support Systems: Spouse/significant other, Children Type of Residence: Private residence Insurance Resources: [employer], Multimedia programmer (specify)([employer]) Museum/gallery curator Resources: Radio broadcast assistant Screen Referred: No Living Expenses: Medical laboratory scientific officer Management: Spouse, Patient Does the patient have any problems obtaining your medications?: No Home Management: Mostly pt's wife Patient/Family Preliminary Plans: Pt to d/c home with wife able to provide 24/7 support. Expected length of stay: ELOS 10 to 14 days  Clinical Impression Significantly cognitively impaired gentleman transferring to CIR from [facility] following tumor resection.  Currently in enclosure bed due to cognition/ safety concerns with wife and daughter at bedside and very involved and supportive.  Wife does feel that his cognition is slightly improved.  Will  continue to follow for support and d/c planning needs.  Will involve neuropsychology when appropriate.  Khristian Seals 08/29/2018, 3:10 PM

## 2018-09-08 NOTE — Care Management (Signed)
[facility] [facility] Individual Statement of Services  Patient Name:  Jason Farrell.  Date:  09/08/2018  Welcome to the [facility].  Our goal is to provide you with an individualized program based on your diagnosis and situation, designed to meet your specific needs.  With this comprehensive rehabilitation program, you will be expected to participate in at least 3 hours of rehabilitation therapies Monday-Friday, with modified therapy programming on the weekends.  Your rehabilitation program will include the following services:  Physical Therapy (PT), Occupational Therapy (OT), Speech Therapy (ST), 24 hour per day rehabilitation nursing, Therapeutic Recreaction (TR), Neuropsychology, Case Management (Social Worker), Rehabilitation Medicine, Nutrition Services and Pharmacy Services  Weekly team conferences will be held on Tuesdays to discuss your progress.  Your Social Worker will talk with you frequently to get your input and to update you on team discussions.  Team conferences with you and your family in attendance may also be held.  Expected length of stay: 18-21 days   Overall anticipated outcome: minimal assistance  Depending on your progress and recovery, your program may change. Your Social Worker will coordinate services and will keep you informed of any changes. Your Social Worker's name and contact numbers are listed  below.  The following services may also be recommended but are not provided by the [facility] will be made to provide these services after discharge if needed.  Arrangements include referral to agencies that provide these services.  Your insurance has been verified to be:  Medicare; [place] Your primary doctor is:  [employer]  Pertinent information will be shared with your doctor and your insurance  company.  Social Worker:  [place], [place] or ([phone]   Information discussed with and copy given to patient by: Lennart Pall, 09/08/2018, 3:30 PM

## 2018-09-09 ENCOUNTER — Inpatient Hospital Stay (HOSPITAL_COMMUNITY): Payer: Managed Care, Other (non HMO) | Admitting: Speech Pathology

## 2018-09-09 ENCOUNTER — Inpatient Hospital Stay (HOSPITAL_COMMUNITY): Payer: Managed Care, Other (non HMO) | Admitting: Physical Therapy

## 2018-09-09 ENCOUNTER — Inpatient Hospital Stay (HOSPITAL_COMMUNITY): Payer: Managed Care, Other (non HMO) | Admitting: Occupational Therapy

## 2018-09-09 DIAGNOSIS — R7309 Other abnormal glucose: Secondary | ICD-10-CM

## 2018-09-09 LAB — GLUCOSE, CAPILLARY
Glucose-Capillary: 190 mg/dL — ABNORMAL HIGH (ref 70–99)
Glucose-Capillary: 194 mg/dL — ABNORMAL HIGH (ref 70–99)
Glucose-Capillary: 200 mg/dL — ABNORMAL HIGH (ref 70–99)
Glucose-Capillary: 173 mg/dL — ABNORMAL HIGH (ref 70–99)

## 2018-09-09 LAB — MAGNESIUM: Magnesium: 1.7 mg/dL (ref 1.7–2.4)

## 2018-09-09 NOTE — Progress Notes (Addendum)
Physical Therapy Session Note  Patient Details  Name: Jason Farrell. MRN: [id number] Date of Birth: 11-30-1940  Today's Date: 09/09/2018 PT Individual Time: 1300-1358 PT Individual Time Calculation (min): 58 min   Short Term Goals: Week 1:  PT Short Term Goal 1 (Week 1): PAtient will consistently demonstrate bed mobility with mod A PT Short Term Goal 2 (Week 1): Patient to ambulate 100' min A PT Short Term Goal 3 (Week 1): Patient will negotiate 4 6" steps with rails min A  Skilled Therapeutic Interventions/Progress Updates:    pt rec'd sitting on toilet with nursing present. Pt min A for sit <> stand from toilet, min A for clothing negotiation, total A for hygiene.  Pt performs gait and standing balance with CGA with RW throughout unit on carpet and tile floors up to 200'. Much improved endurance since last session.  Stair negotiation 4 stairs with bilat handrails with min A, reciprocal pattern.  4 stairs with 1 handrail with min A to ascend, mod A to descend.  Pt performs gait multiple reps up to 100' without AD with min A, improved posture noted.  Furniture transfer training in ADL apartment with pt able to perform sit <> Stand from couch and recliner with min A, supervision for bed mobility. Standing tap ups and step ups with min A for bilat LE strengthening.  Standing squat and reach task with giant connect 4 game with min/mod A for low squat.  Pt left in room with alarm set, needs at hand.  Therapy Documentation Precautions:  Precautions Precautions: Fall Restrictions Weight Bearing Restrictions: No Pain:  no c/o pain this session   Therapy/Group: Individual Therapy  DONAWERTH,KAREN 09/09/2018, 2:13 PM

## 2018-09-09 NOTE — Plan of Care (Signed)
  Problem: Consults Goal: RH BRAIN INJURY PATIENT EDUCATION Description Description: See Patient Education module for eduction specifics Outcome: Progressing   Problem: RH BOWEL ELIMINATION Goal: RH STG MANAGE BOWEL WITH ASSISTANCE Description STG Manage Bowel with mod I Assistance.  Outcome: Progressing Goal: RH STG MANAGE BOWEL W/MEDICATION W/ASSISTANCE Description STG Manage Bowel with Medication with mod I Assistance.  Outcome: Progressing   Problem: RH BLADDER ELIMINATION Goal: RH STG MANAGE BLADDER WITH ASSISTANCE Description STG Manage Bladder With min Assistance  Outcome: Progressing   Problem: RH SKIN INTEGRITY Goal: RH STG SKIN FREE OF INFECTION/BREAKDOWN Description Manage skin with min assist   Outcome: Progressing Goal: RH STG MAINTAIN SKIN INTEGRITY WITH ASSISTANCE Description STG Maintain Skin Integrity With min Assistance.  Outcome: Progressing

## 2018-09-09 NOTE — Progress Notes (Signed)
Occupational Therapy Session Note  Patient Details  Name: Jason Farrell. MRN: [id number] Date of Birth: 26-Feb-1941  Today's Date: 09/09/2018 OT Individual Time: 1502-1530 OT Individual Time Calculation (min): 28 min    Short Term Goals: Week 1:  OT Short Term Goal 1 (Week 1): Pt will locate 1 grooming item on the sink with mod questioning cues OT Short Term Goal 2 (Week 1): Pt will complete toilet transfer with mod A of 1 OT Short Term Goal 3 (Week 1): Pt will complete 1 step of LB dressing activity  with mod instructional cues OT Short Term Goal 4 (Week 1): Pt will complete 1 step of toothbrushing activity with mod instructional cues  Skilled Therapeutic Interventions/Progress Updates:    Treatment session with focus on attention to task, Rt attention, and memory.  Pt received upright in w/c at RN station for supervision.  Attempted to engage pt in Blink activity with focus on visual attention and memory.  Pt unable to differentiate colors therefore modified task to focus on visual scanning and attention to task.  Engaged in memory with matching cards with focus on recall of placement of cards with pt unable to sustain attention enough to fully participate in task.  Therapist again modified to removing some stimulus and turning card over and allowing pt to alternate scanning to locate matching card.  Pt returned to room and completed stand pivot transfer to enclosure bed with min assist.  Pt left in enclosure bed with call bell in reach.  Therapy Documentation Precautions:  Precautions Precautions: Fall Restrictions Weight Bearing Restrictions: No General:   Vital Signs: Therapy Vitals Temp: 97.9 F (36.6 C) Temp Source: Oral Pulse Rate: 100 Resp: 20 BP: 102/61 Patient Position (if appropriate): Sitting Oxygen Therapy SpO2: 96 % O2 Device: Room Air Pain:  Pt with no c/o pain   Therapy/Group: Individual Therapy  Simonne Come 09/09/2018, 4:00 PM

## 2018-09-09 NOTE — Progress Notes (Signed)
Occupational Therapy Session Note  Patient Details  Name: Jason Farrell. MRN: [id number] Date of Birth: 1941-04-25  Today's Date: 09/09/2018 OT Individual Time: 1000-1030 OT Individual Time Calculation (min): 30 min   Short Term Goals: Week 1:  OT Short Term Goal 1 (Week 1): Pt will locate 1 grooming item on the sink with mod questioning cues OT Short Term Goal 2 (Week 1): Pt will complete toilet transfer with mod A of 1 OT Short Term Goal 3 (Week 1): Pt will complete 1 step of LB dressing activity  with mod instructional cues OT Short Term Goal 4 (Week 1): Pt will complete 1 step of toothbrushing activity with mod instructional cues  Skilled Therapeutic Interventions/Progress Updates:    Pt greeted sitting in wc and agreeable to OT treatment session. Wife and daughter present. Pt more alert and oriented this morning. Pt requests to shave. Worked on standing balance and endurance while participating in shaving task at midline. Pt able to look in the mirror today and locate missed spots shaving with only min verbal cues. Pt reported need to go to the bathroom. Pt ambulated into bathroom w/ RW and min A + verbal cues for proximity to RW. Pt voided bowel and bladder. Worked on toileting strategies with pt needing OT assist for thoroughness. Pt returned to wc and left seated with family present and alarm belt on.   Therapy Documentation Precautions:  Precautions Precautions: Fall Restrictions Weight Bearing Restrictions: No Pain:   none/denies pain  Therapy/Group: Individual Therapy  Valma Cava 09/09/2018, 1:00 PM

## 2018-09-09 NOTE — Progress Notes (Signed)
Speech Language Pathology Daily Session Note  Patient Details  Name: Jason Farrell. MRN: [id number] Date of Birth: 27-Jan-1941  Today's Date: 09/09/2018 SLP Individual Time: 0900-0955 SLP Individual Time Calculation (min): 55 min  Short Term Goals: Week 1: SLP Short Term Goal 1 (Week 1): Patient will demonstrate sustaind attention to tasks for ~5 minutes with Mod A verbal cus for redirection.  SLP Short Term Goal 2 (Week 1): Patient will orient to place, month and situation with Max A multimodal cues.  SLP Short Term Goal 3 (Week 1): Patient will verbalize 2 physical/cognitive changes since hospitalization with Max A multomodal cues.  SLP Short Term Goal 4 (Week 1): Patient will demonstrate functional problem solving during very basic ADL tasks with Max A multimodal cues.   Skilled Therapeutic Interventions: Skilled treatment session focused on on cognitive goals. SLP facilitated session by providing extra time and overall Mod A verbal cues for visual scanning, sustained attention and functional problem solving during a calendar making task. Patient required total A for orientation to city but was able to utilize an external aid with Min A verbal cues. Overall, patient with improved visual acuity today during functional tasks. Patient left upright in wheelchair with family present and alarm on. Continue with current plan of care.      Pain No/Denies Pain   Therapy/Group: Individual Therapy  Lavaughn Haberle 09/09/2018, 4:25 PM

## 2018-09-09 NOTE — Progress Notes (Signed)
Patient wanted to wear CPAP but suddenly changed his mind and declined. No distress noted at this time.

## 2018-09-09 NOTE — Progress Notes (Signed)
[facility] PHYSICAL MEDICINE & REHABILITATION PROGRESS NOTE   Subjective/Complaints: Patient seen laying in bed this morning.  States he slept well overnight, confirmed with sleep chart.  He engages more this morning.  ROS: Limited due to cognition   Objective:   No results found. No results for input(s): WBC, HGB, HCT, PLT in the last 72 hours. No results for input(s): NA, K, CL, CO2, GLUCOSE, BUN, CREATININE, CALCIUM in the last 72 hours.  Intake/Output Summary (Last 24 hours) at 09/09/2018 0924 Last data filed at 09/09/2018 0700 Gross per 24 hour  Intake 480 ml  Output 450 ml  Net 30 ml     Physical Exam: Vital Signs Blood pressure (!) 166/94, pulse 94, temperature 97.7 F (36.5 C), temperature source Oral, resp. rate 18, height 5\' 6"  (1.676 m), weight 86 kg, SpO2 100 %. Constitutional: No distress . Vital signs reviewed. HENT: Normocephalic.  Atraumatic. Eyes: EOMI. No discharge. Cardiovascular: RRR.  No JVD. Respiratory: CTA bilaterally.  Normal effort. GI: BS +. Non-distended. Musc: No edema or tenderness in extremities. Neurologic: Alert and oriented, improving Motor: Limited due to ability to follow commands, however >/ 4/5 throughout Psych: Restless.   Assessment/Plan: 1. 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  Physiatrist is providing close team supervision and 24 hour management of active medical problems listed below.  Physiatrist and rehab team continue to assess barriers to discharge/monitor patient progress toward functional and medical goals  Care Tool:  Bathing    Body parts bathed by patient: Chest, Abdomen   Body parts bathed by helper: Right arm, Left arm, Front perineal area, Buttocks, Right upper leg, Left upper leg, Right lower leg, Left lower leg, Face     Bathing assist Assist Level: Total Assistance - Patient < 25%     Upper  Body Dressing/Undressing Upper body dressing   What is the patient wearing?: Pull over shirt    Upper body assist Assist Level: Maximal Assistance - Patient 25 - 49%    Lower Body Dressing/Undressing Lower body dressing      What is the patient wearing?: Pants, Incontinence brief     Lower body assist Assist for lower body dressing: Total Assistance - Patient < 25%     Toileting Toileting Toileting Activity did not occur (Clothing management and hygiene only): N/A (no void or bm)  Toileting assist Assist for toileting: Moderate Assistance - Patient 50 - 74%     Transfers Chair/bed transfer  Transfers assist     Chair/bed transfer assist level: Minimal Assistance - Patient > 75%     Locomotion Ambulation   Ambulation assist      Assist level: Minimal Assistance - Patient > 75% Assistive device: Walker-rolling Max distance: 167ft   Walk 10 feet activity   Assist     Assist level: Minimal Assistance - Patient > 75% Assistive device: Walker-rolling   Walk 50 feet activity   Assist    Assist level: Minimal Assistance - Patient > 75% Assistive device: Walker-rolling    Walk 150 feet activity   Assist Walk 150 feet activity did not occur: Safety/medical concerns         Walk 10 feet on uneven surface  activity   Assist Walk 10 feet on uneven surfaces activity did not occur: Safety/medical concerns         Wheelchair     Assist Will patient use wheelchair at discharge?: Yes Type of  Wheelchair: Agricultural engineer assist level: Minimal Assistance - Patient > 75% Max wheelchair distance: 30'    Wheelchair 50 feet with 2 turns activity    Assist    Wheelchair 50 feet with 2 turns activity did not occur: Safety/medical concerns       Wheelchair 150 feet activity     Assist Wheelchair 150 feet activity did not occur: Safety/medical concerns        Medical Problem List and Plan: 1.  Decline in self care and  mobility secondary to meningioma s/p partial resection with IVH  Continue CIR 2. DVT Prophylaxis/Anticoagulation: Pharmaceutical:Lovenox 3. Pain Management:tylenol prn. 4. Mood:LCSW to follow for evaluation and support. 5. Neuropsych: This patientis notcapable of making decisions on hisown behalf.  Continue enclosure bed for safety 6. Skin/Wound Care:Routine pressure relief measures. 7. Fluids/Electrolytes/Nutrition:Monitor I/O.    hypokalemia: 4.0 on 2/22, continue supplementation  hypomagnesia: 1.7 on 2/24, continue supplementation  prostat for low albumin  encourage PO 8. Recurrent SVT: Resolved. Continueamiodarone and metoprolol.Continue to monitor HR bid.   9. HTN: ON Lisinopril, Microzide, Metoprolol and Norvasc. Monitor BP bid   Labile on 2/25, monitor for trend 10. Delirium/Hallucinations: Continue seroquel bid and titrate as indicated---should improve in appropriate/controlled environment.   Ativan DC'd  Continue enclosure bed required for safety 11. T2DM:   Pt on glucotrol 5mg  bid ac at home.   Glucotrol 2.5mg  bid ac increased to 5 twice daily on 2/24  Labile on 2/25 12. OSA/Hypoxia: Needed oxygen with activity 2/20. Resume CPAP    Weaned supplemental oxygen   LOS: 5 days A FACE TO FACE EVALUATION WAS PERFORMED  [name] 09/09/2018, 9:24 AM

## 2018-09-09 NOTE — Plan of Care (Signed)
  Problem: Consults Goal: RH BRAIN INJURY PATIENT EDUCATION Description Description: See Patient Education module for eduction specifics Outcome: Progressing   Problem: RH BOWEL ELIMINATION Goal: RH STG MANAGE BOWEL WITH ASSISTANCE Description STG Manage Bowel with mod I Assistance.  Outcome: Progressing Goal: RH STG MANAGE BOWEL W/MEDICATION W/ASSISTANCE Description STG Manage Bowel with Medication with mod I Assistance.  Outcome: Progressing   Problem: RH BLADDER ELIMINATION Goal: RH STG MANAGE BLADDER WITH ASSISTANCE Description STG Manage Bladder With min Assistance  Outcome: Progressing   Problem: RH SKIN INTEGRITY Goal: RH STG SKIN FREE OF INFECTION/BREAKDOWN Description Manage skin with min assist   Outcome: Progressing Goal: RH STG MAINTAIN SKIN INTEGRITY WITH ASSISTANCE Description STG Maintain Skin Integrity With min Assistance.  Outcome: Progressing   Problem: RH SAFETY Goal: RH STG ADHERE TO SAFETY PRECAUTIONS W/ASSISTANCE/DEVICE Description STG Adhere to Safety Precautions With cues/reminders Assistance/Device.  Outcome: Progressing   Problem: RH COGNITION-NURSING Goal: RH STG USES MEMORY AIDS/STRATEGIES W/ASSIST TO PROBLEM SOLVE Description STG Uses Memory Aids/Strategies With cues/reminders/reorientation 2/2 visual deficits Assistance to Problem Solve.  Outcome: Progressing   Problem: RH KNOWLEDGE DEFICIT BRAIN INJURY Goal: RH STG INCREASE KNOWLEDGE OF SELF CARE AFTER BRAIN INJURY Description Pts wife will be able to state understanding of information provided and manage care for pt p discharge independently using handouts/educational resources  Outcome: Progressing

## 2018-09-09 NOTE — Progress Notes (Signed)
Physical Therapy Session Note  Patient Details  Name: Jason Farrell. MRN: [id number] Date of Birth: 1940-10-06  Today's Date: 09/09/2018 PT Individual Time: 0800-0830 PT Individual Time Calculation (min): 30 min   Short Term Goals: Week 1:  PT Short Term Goal 1 (Week 1): PAtient will consistently demonstrate bed mobility with mod A PT Short Term Goal 2 (Week 1): Patient to ambulate 100' min A PT Short Term Goal 3 (Week 1): Patient will negotiate 4 6" steps with rails min A  Skilled Therapeutic Interventions/Progress Updates:   Pt received in care of NT, using urinal. Pt agreeable to therapy and no c/o pain. Sit<>stand w/ CGA to void, donned pants at EOB w/ min assist and shoes w/ total assist. Stand pivot transfer to w/c w/ min assist. Sat up in w/c to eat breakfast w/ mod verbal, tactile, and visual cues to attend to task, to visually scan entire tray, and occasional hand-over-hand assist to attach bite of food to fork 2/2 visual impairments. Ended session in w/c and in care of RN at nurses station, all needs met.   Therapy Documentation Precautions:  Precautions Precautions: Fall Restrictions Weight Bearing Restrictions: No Vital Signs: Therapy Vitals Temp: 97.9 F (36.6 C) Temp Source: Oral Pulse Rate: 100 Resp: 20 BP: 102/61 Patient Position (if appropriate): Sitting Oxygen Therapy SpO2: 96 % O2 Device: Room Air  Therapy/Group: Individual Therapy  Kaci Dillie Clent Demark 09/09/2018, 5:12 PM

## 2018-09-10 ENCOUNTER — Inpatient Hospital Stay (HOSPITAL_COMMUNITY): Payer: Managed Care, Other (non HMO)

## 2018-09-10 ENCOUNTER — Inpatient Hospital Stay (HOSPITAL_COMMUNITY): Payer: Managed Care, Other (non HMO) | Admitting: Speech Pathology

## 2018-09-10 DIAGNOSIS — D62 Acute posthemorrhagic anemia: Secondary | ICD-10-CM

## 2018-09-10 LAB — GLUCOSE, CAPILLARY
Glucose-Capillary: 167 mg/dL — ABNORMAL HIGH (ref 70–99)
Glucose-Capillary: 177 mg/dL — ABNORMAL HIGH (ref 70–99)
Glucose-Capillary: 175 mg/dL — ABNORMAL HIGH (ref 70–99)
Glucose-Capillary: 174 mg/dL — ABNORMAL HIGH (ref 70–99)

## 2018-09-10 LAB — BASIC METABOLIC PANEL
Anion gap: 10 (ref 5–15)
BUN: 15 mg/dL (ref 8–23)
CO2: 25 mmol/L (ref 22–32)
Calcium: 9.2 mg/dL (ref 8.9–10.3)
Chloride: 104 mmol/L (ref 98–111)
Creatinine, Ser: 0.81 mg/dL (ref 0.61–1.24)
GFR calc Af Amer: >60 mL/min (ref >60)
GFR calc non Af Amer: >60 mL/min (ref >60)
Glucose, Bld: 195 mg/dL — ABNORMAL HIGH (ref 70–99)
Potassium: 4.5 mmol/L (ref 3.5–5.1)
Sodium: 139 mmol/L (ref 135–145)

## 2018-09-10 LAB — CBC
HCT: 37.4 % — ABNORMAL LOW (ref 39.0–52.0)
Hemoglobin: 11.6 g/dL — ABNORMAL LOW (ref 13.0–17.0)
MCH: 27.9 pg (ref 26.0–34.0)
MCHC: 31 g/dL (ref 30.0–36.0)
MCV: 89.9 fL (ref 80.0–100.0)
Platelets: 608 10*3/uL — ABNORMAL HIGH (ref 150–400)
RBC: 4.16 MIL/uL — ABNORMAL LOW (ref 4.22–5.81)
RDW: 14.8 % (ref 11.5–15.5)
WBC: 6.3 10*3/uL (ref 4.0–10.5)
nRBC: 0 % (ref 0.0–0.2)

## 2018-09-10 MED ORDER — POTASSIUM CHLORIDE CRYS ER 20 MEQ PO TBCR
20.0000 meq | EXTENDED_RELEASE_TABLET | Freq: Every day | ORAL | Status: DC
  Administered 2018-09-11 – 2018-09-18 (×8): 20 meq via ORAL
  Filled 2018-09-10 (×8): qty 1

## 2018-09-10 MED ORDER — ENSURE MAX PROTEIN PO LIQD
11.0000 [oz_av] | Freq: Two times a day (BID) | ORAL | Status: DC
  Administered 2018-09-10 – 2018-09-18 (×15): 11 [oz_av] via ORAL
  Filled 2018-09-10 (×17): qty 330

## 2018-09-10 NOTE — Progress Notes (Signed)
Nutrition Follow-up  DOCUMENTATION CODES:   Obesity unspecified  INTERVENTION:   - Recommend obtaining new weight  - Continue Ensure Max po BID, each supplement provides 150 kcal and 30 grams of protein.   - Continue Magic cup TID with meals, each supplement provides 290 kcal and 9 grams of protein  - Continue MVI with minerals daily  NUTRITION DIAGNOSIS:   Increased nutrient needs related to wound healing as evidenced by estimated needs.  Ongoing, being addressed with oral nutrition supplements  GOAL:   Patient will meet greater than or equal to 90% of their needs  Progressing  MONITOR:   PO intake, Weight trends, Supplement acceptance, I & O's  REASON FOR ASSESSMENT:   Malnutrition Screening Tool    ASSESSMENT:   78 year old male with PMH of CAD, HTN, OSA-CPAP, T2DM, GERD, MI, AMS, difficulty speaking s/p recent craniotomy w/ EVD placement for meningioma.  Spoke with pt, pt's wife, and pt's daughter at bedside. Pt in enclosure bed at time of visit so unable to perform NFPE. Per RD note on 09/13/18, pt with no fat or muscle depletions.  Pt reports that he did "well" at breakfast this morning. Noted meal completion recorded as 80%. Pt and wife both state that pt has been eating more over the last week.  Pt confirms that he is drinking 2 Ensure Max oral nutrition supplements daily and likes them. Pt with questions regarding supplement intake after d/c. RD encouraged pt to consume 1-2 oral nutrition supplements (Ensure Max, Glucerna, Premier Protein) daily for about 1 month after d/c or until appetite and PO intake return back to baseline.  Per MAR, pt has been taking MVI with minerals daily and accepting Ensure Max oral nutrition supplement >90% of the times offered. Will decrease order from TID to BID given improved PO intake.  No new weights since admission. Per weight history in chart, pt with 15.8 kg weight loss since 07/04/18. This is a 15.5% weight loss in less  than 3 months which is significant for timeframe. Recommend obtaining new weight.  Meal Completion: 50-100% x 8 meals  Medications reviewed and include: Colace, Glucotrol, SSI, magnesium oxide, MVI with minerals daily, Protonix, K-dur 40 mEq BID, Ensure Max TID  Labs reviewed. CBG's: 167, 173, 200, 194, x 24 hours  NUTRITION - FOCUSED PHYSICAL EXAM:  Unable to complete at this time as pt is in enclosure bed. Per RD note on 09/13/18, no muscle or fat depletions. Will re-attempt at follow-up.  Diet Order:   Diet Order            Diet heart healthy/carb modified Room service appropriate? Yes; Fluid consistency: Thin  Diet effective now              EDUCATION NEEDS:   Not appropriate for education at this time  Skin:  Skin Assessment: Skin Integrity Issues: Stage II: sacrum Other: MASD groin, closed incision to head  Last BM:  2/25 large type 4  Height:   Ht Readings from Last 1 Encounters:  09/04/18 5\' 6"  (1.676 m)    Weight:   Wt Readings from Last 1 Encounters:  09/04/18 86 kg    Ideal Body Weight:  64.5 kg  BMI:  Body mass index is 30.6 kg/m.  Estimated Nutritional Needs:   Kcal:  1930-2160  Protein:  111-120 grams  Fluid:  1.9 L/day    Gaynell Face, MS, RD, LDN Inpatient Clinical Dietitian Pager: [phone] Weekend/After Hours: [phone]

## 2018-09-10 NOTE — Plan of Care (Signed)
  Problem: Consults Goal: RH BRAIN INJURY PATIENT EDUCATION Description Description: See Patient Education module for eduction specifics Outcome: Progressing   Problem: RH BOWEL ELIMINATION Goal: RH STG MANAGE BOWEL WITH ASSISTANCE Description STG Manage Bowel with mod I Assistance.  Outcome: Progressing Goal: RH STG MANAGE BOWEL W/MEDICATION W/ASSISTANCE Description STG Manage Bowel with Medication with mod I Assistance.  Outcome: Progressing   Problem: RH SKIN INTEGRITY Goal: RH STG SKIN FREE OF INFECTION/BREAKDOWN Description Manage skin with min assist   Outcome: Progressing Goal: RH STG MAINTAIN SKIN INTEGRITY WITH ASSISTANCE Description STG Maintain Skin Integrity With min Assistance.  Outcome: Progressing   Problem: RH SAFETY Goal: RH STG ADHERE TO SAFETY PRECAUTIONS W/ASSISTANCE/DEVICE Description STG Adhere to Safety Precautions With cues/reminders Assistance/Device.  Outcome: Progressing

## 2018-09-10 NOTE — Progress Notes (Signed)
Speech Language Pathology Daily Session Note  Patient Details  Name: Jason Farrell. MRN: [id number] Date of Birth: 01/14/1941  Today's Date: 09/10/2018 SLP Individual Time: 0086-7619 SLP Individual Time Calculation (min): 45 min  Short Term Goals: Week 1: SLP Short Term Goal 1 (Week 1): Patient will demonstrate sustaind attention to tasks for ~5 minutes with Mod A verbal cus for redirection.  SLP Short Term Goal 2 (Week 1): Patient will orient to place, month and situation with Max A multimodal cues.  SLP Short Term Goal 3 (Week 1): Patient will verbalize 2 physical/cognitive changes since hospitalization with Max A multomodal cues.  SLP Short Term Goal 4 (Week 1): Patient will demonstrate functional problem solving during very basic ADL tasks with Max A multimodal cues.   Skilled Therapeutic Interventions: Skilled treatment session focused on cognitive goals. SLP facilitated session by providing Min A verbal cues for problem solving while donning his clothes. SLP also facilitated session by re-administering the MoCA-BLIND. Patient scored 15/22 points with continued deficits in short-term memory. However, patient's score is much improved from 6/22 when it was initially administered on 2/21. Patient's family present and educated in regards to results with all questions answered at this time. Patient left upright in the wheelchair with family present. Continue with current plan of care.      Pain Pain Assessment Pain Scale: 0-10 Pain Score: 0-No pain  Therapy/Group: Individual Therapy  Jason Farrell 09/10/2018, 3:49 PM

## 2018-09-10 NOTE — Progress Notes (Signed)
Pt not wearing CPAP. 

## 2018-09-10 NOTE — Progress Notes (Signed)
Occupational Therapy Session Note  Patient Details  Name: Jason Farrell. MRN: [id number] Date of Birth: [birth year]  Today's Date: 09/10/2018 OT Individual Time: 0700-0755 OT Individual Time Calculation (min): 55 min    Short Term Goals: Week 1:  OT Short Term Goal 1 (Week 1): Pt will locate 1 grooming item on the sink with mod questioning cues OT Short Term Goal 2 (Week 1): Pt will complete toilet transfer with mod A of 1 OT Short Term Goal 3 (Week 1): Pt will complete 1 step of LB dressing activity  with mod instructional cues OT Short Term Goal 4 (Week 1): Pt will complete 1 step of toothbrushing activity with mod instructional cues  Skilled Therapeutic Interventions/Progress Updates:    1:1. Pt received in enclosure bed reporting 3/10 HA requesting tylenol. RN delivers during session. Pt agreeable to shower this date. Pt completes stand pivot transfers with Min A for steadying and VC for safe reach back. tp showers with intermittant touching A for sitting balance d/t posterior bias. Pt completes standing peri care with min A and requires A to wash B feet. Pt requires significantly increased time at sink to work on visual scanning. Pt consistantly undershoots thoruhgout session requring VC for attention to items grabbed attempting to comb hair with tooth brush. Pt completes hair and oral care with MOD VC. Exited session with pt requesting to return to bed. Exited session with pt seated in w/c with call light in reach and all needs met  Therapy Documentation Precautions:  Precautions Precautions: Fall Restrictions Weight Bearing Restrictions: No General:   Vital Signs: Therapy Vitals Temp: 98.2 F (36.8 C) Temp Source: Oral Pulse Rate: 94 Resp: 18 BP: (!) 155/87 Patient Position (if appropriate): Lying Oxygen Therapy SpO2: 100 % O2 Device: Room Air Pain: Pain Assessment Pain Scale: 0-10 Pain Score: 3  Pain Location: Back Pain Orientation: Mid;Lower Pain  Descriptors / Indicators: Aching Patients Stated Pain Goal: 2 Pain Intervention(s): Medication (See eMAR) ADL: ADL Eating: Dependent Grooming: Dependent Upper Body Bathing: Maximal cueing, Maximal assistance Where Assessed-Upper Body Bathing: Wheelchair, Sitting at sink Lower Body Bathing: Dependent Where Assessed-Lower Body Bathing: Sitting at sink, Wheelchair Upper Body Dressing: Maximal assistance, Maximal cueing Where Assessed-Upper Body Dressing: Wheelchair Lower Body Dressing: Dependent, Maximal cueing Where Assessed-Lower Body Dressing: Wheelchair Toileting: Dependent Toilet Transfer: Unable to assess Vision   Perception    Praxis   Exercises:   Other Treatments:     Therapy/Group: Individual Therapy  Tonny Branch 09/10/2018, 7:57 AM

## 2018-09-10 NOTE — Progress Notes (Signed)
Speech Language Pathology Daily Session Note  Patient Details  Name: Jason Farrell. MRN: [id number] Date of Birth: 12-22-1940  Today's Date: 09/10/2018 SLP Individual Time: 1050-1120 SLP Individual Time Calculation (min): 30 min  Short Term Goals: Week 1: SLP Short Term Goal 1 (Week 1): Patient will demonstrate sustaind attention to tasks for ~5 minutes with Mod A verbal cus for redirection.  SLP Short Term Goal 2 (Week 1): Patient will orient to place, month and situation with Max A multimodal cues.  SLP Short Term Goal 3 (Week 1): Patient will verbalize 2 physical/cognitive changes since hospitalization with Max A multomodal cues.  SLP Short Term Goal 4 (Week 1): Patient will demonstrate functional problem solving during very basic ADL tasks with Max A multimodal cues.   Skilled Therapeutic Interventions:  Skilled treatment session focused on cognition goals. SLP received pt upright in wheelchair with his wife and daughter present.  SLP facilitated session by providing sorting task with coins that were placed on white paper for increased visual contrast. Pt with improved selective attention to task, improved working memory during task but required Max A faded to Mod A to visually sort task. With tactile assistance, pt able to name each coin correctly with ~75% accuracy. Pt was supervision for recall of all orientation information. Education provided to wife regarding how to facilitate correct responses. Wife very responsive and able to return demonstrate knowledge. SLP returned pt to room, left him upright in wheelchair with his family present. Continue per current plan of care.      Pain Pain Assessment Pain Scale: 0-10 Pain Score: 0-No pain  Therapy/Group: Individual Therapy  Enzio Buchler 09/10/2018, 2:23 PM

## 2018-09-10 NOTE — Progress Notes (Signed)
Pt resting in brief intervals throughout the night. Confused and disorganized and restless at times. Pt given trazodone prn for sleep, effective, however short acting. Incontinent of urine several times this shift. Enclosure bed remains intact, patient assessed every two hours and as needed. Refused CPAP at hs. No distress noted throughout the night.

## 2018-09-10 NOTE — Progress Notes (Signed)
Physical Therapy Session Note  Patient Details  Name: [name]. MRN: [id number] Date of Birth: [birth year]  Today's Date: 09/10/2018 PT Individual Time: 6440-3474 PT Individual Time Calculation (min): 70 min   Short Term Goals: Week 1:  PT Short Term Goal 1 (Week 1): PAtient will consistently demonstrate bed mobility with mod A PT Short Term Goal 2 (Week 1): Patient to ambulate 100' min A PT Short Term Goal 3 (Week 1): Patient will negotiate 4 6" steps with rails min A  Skilled Therapeutic Interventions/Progress Updates:    Pt agreeable to therapy session. Min to mod assist to facilitate coming to EOB with extra time needed for processing and initiation. Donned pants from bed level with assist to start threading on each side as pt unsuccessful after several attempts due to visual perceptual deficits and request to use bathroom so PT assisted with threading of pants and donning of shoes for time management. In standing, pt able to pull up pants with min assist for balance and for sit <> stands. Gait in and out of bathroom with min assist and mod verbal cues for direction and avoidance of obstacles as well as positioning of RW for safety. Pt unsuccessful with toileting. Requires cues to locate toilet paper and requires extra time to scan to L when prompted with verbal and tactile cues. Pt oriented to reason for being in th hospital and able to recall location. Functional gait training on unit x 200' with overall min assist and mod to max verbal cues for attention to obstacles and slow speed to avoid obstacles in his path. Once in therapy gym, pt request to return to bed and states he thought we were walking to his bed, not to therapy. Despite encouragement, pt continues to decline due to fatigue. Max cues for pathfinding back to room with RW and cues for safety as described above (min assist). Agreeable to engage in cognitive task in room. Pt request to remove pants and shoes prior to  agreeing to continue in session. Required assist with shoes but able to remove pants with min assist. Utilized pipe tree to make simple design (goal post) which patient refused to sit EOB, so completed in supine/reclined position. Pt demonstrating difficulty with task (with PT giving pt only 2 options of which piece to use and max cues for orientation to puzzle) requiring max progressing to mod cues with task further simplified to improve success with problem solving, visual perceptual orientation, and overall attention. Pt able to sustain attention to entire task x 20' min.   Therapy Documentation Precautions:  Precautions Precautions: Fall Restrictions Weight Bearing Restrictions: No  Pain: Pain Assessment Pain Scale: 0-10 Pain Score: 0-No pain   Therapy/Group: Individual Therapy  Canary Brim Ivory Broad, PT, DPT, CBIS  09/10/2018, 3:37 PM

## 2018-09-10 NOTE — Progress Notes (Signed)
[place] PHYSICAL MEDICINE & REHABILITATION PROGRESS NOTE   Subjective/Complaints: Patient seen working with therapy this morning.  He slept fairly overnight per sleep chart.  Orientation improving with cueing.  ROS: Limited due to cognition   Objective:   No results found. Recent Labs    09/10/18 0532  WBC 6.3  HGB 11.6*  HCT 37.4*  PLT 608*   Recent Labs    09/10/18 0532  NA 139  K 4.5  CL 104  CO2 25  GLUCOSE 195*  BUN 15  CREATININE 0.81  CALCIUM 9.2    Intake/Output Summary (Last 24 hours) at 09/10/2018 1045 Last data filed at 09/09/2018 2000 Gross per 24 hour  Intake 480 ml  Output -  Net 480 ml     Physical Exam: Vital Signs Blood pressure (!) 155/87, pulse 94, temperature 98.2 F (36.8 C), temperature source Oral, resp. rate 18, height 5\' 6"  (1.676 m), weight 86 kg, SpO2 100 %. Constitutional: No distress . Vital signs reviewed. HENT: Normocephalic.  Atraumatic. Eyes: EOMI. No discharge. Cardiovascular: RRR.  No JVD. Respiratory: CTA bilaterally.  Normal effort. GI: BS +. Non-distended. Musc: No edema or tenderness in extremities. Neurologic: Alert and oriented, improved with cueing Motor: Grossly >/4/5 throughout Psych: Flat.   Assessment/Plan: 1. 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  Physiatrist is providing close team supervision and 24 hour management of active medical problems listed below.  Physiatrist and rehab team continue to assess barriers to discharge/monitor patient progress toward functional and medical goals  Care Tool:  Bathing    Body parts bathed by patient: Chest, Abdomen   Body parts bathed by helper: Right arm, Left arm, Front perineal area, Buttocks, Right upper leg, Left upper leg, Right lower leg, Left lower leg, Face     Bathing assist Assist Level: Total Assistance - Patient < 25%     Upper Body  Dressing/Undressing Upper body dressing   What is the patient wearing?: Pull over shirt    Upper body assist Assist Level: Maximal Assistance - Patient 25 - 49%    Lower Body Dressing/Undressing Lower body dressing      What is the patient wearing?: Pants, Incontinence brief     Lower body assist Assist for lower body dressing: Total Assistance - Patient < 25%     Toileting Toileting Toileting Activity did not occur (Clothing management and hygiene only): N/A (no void or bm)  Toileting assist Assist for toileting: Moderate Assistance - Patient 50 - 74%     Transfers Chair/bed transfer  Transfers assist     Chair/bed transfer assist level: Contact Guard/Touching assist     Locomotion Ambulation   Ambulation assist      Assist level: Minimal Assistance - Patient > 75% Assistive device: Walker-rolling Max distance: 150   Walk 10 feet activity   Assist     Assist level: Minimal Assistance - Patient > 75% Assistive device: Walker-rolling   Walk 50 feet activity   Assist    Assist level: Minimal Assistance - Patient > 75% Assistive device: Walker-rolling    Walk 150 feet activity   Assist Walk 150 feet activity did not occur: Safety/medical concerns  Assist level: Minimal Assistance - Patient > 75%      Walk 10 feet on uneven surface  activity   Assist Walk 10 feet on uneven surfaces activity did not occur: Safety/medical concerns  Wheelchair     Assist Will patient use wheelchair at discharge?: Yes Type of Wheelchair: Manual    Wheelchair assist level: Minimal Assistance - Patient > 75% Max wheelchair distance: 30'    Wheelchair 50 feet with 2 turns activity    Assist    Wheelchair 50 feet with 2 turns activity did not occur: Safety/medical concerns       Wheelchair 150 feet activity     Assist Wheelchair 150 feet activity did not occur: Safety/medical concerns        Medical Problem List and  Plan: 1.  Decline in self care and mobility secondary to meningioma s/p partial resection with IVH  Continue CIR 2. DVT Prophylaxis/Anticoagulation: Pharmaceutical:Lovenox 3. Pain Management:tylenol prn. 4. Mood:LCSW to follow for evaluation and support. 5. Neuropsych: This patientis notcapable of making decisions on hisown behalf.  Continue enclosure bed for safety 6. Skin/Wound Care:Routine pressure relief measures. 7. Fluids/Electrolytes/Nutrition:Monitor I/O.    hypokalemia: 4.5 on 2/26, supplementation decreased on 2/26  hypomagnesia: 1.7 on 2/24, continue supplementation  prostat for low albumin  encourage PO 8. Recurrent SVT: Resolved. Continueamiodarone and metoprolol.Continue to monitor HR bid.   9. HTN: ON Lisinopril, Microzide, Metoprolol and Norvasc. Monitor BP bid   Labile on 2/26, monitor for trend 10. Delirium/Hallucinations: Continue seroquel bid and titrate as indicated---should improve in appropriate/controlled environment.   Ativan DC'd  Continue enclosure bed required for safety 11. T2DM:   Pt on glucotrol 5mg  bid ac at home.   Glucotrol 2.5mg  bid ac increased to 5 twice daily on 2/24  Elevated on 2/26, will consider further increase tomorrow if necessary 12. OSA/Hypoxia: Needed oxygen with activity 2/20. Resume CPAP    Weaned supplemental oxygen 13.  Acute blood loss anemia  Hemoglobin 11.6 on 2/26  Continue to monitor  LOS: 6 days A FACE TO FACE EVALUATION WAS PERFORMED  Sequoyah Counterman Lorie Phenix 09/10/2018, 10:45 AM

## 2018-09-11 ENCOUNTER — Inpatient Hospital Stay (HOSPITAL_COMMUNITY): Payer: Managed Care, Other (non HMO) | Admitting: Physical Therapy

## 2018-09-11 ENCOUNTER — Inpatient Hospital Stay (HOSPITAL_COMMUNITY): Payer: Managed Care, Other (non HMO) | Admitting: Speech Pathology

## 2018-09-11 ENCOUNTER — Inpatient Hospital Stay (HOSPITAL_COMMUNITY): Payer: Managed Care, Other (non HMO) | Admitting: Occupational Therapy

## 2018-09-11 DIAGNOSIS — F05 Delirium due to known physiological condition: Secondary | ICD-10-CM

## 2018-09-11 DIAGNOSIS — R410 Disorientation, unspecified: Secondary | ICD-10-CM

## 2018-09-11 LAB — GLUCOSE, CAPILLARY
Glucose-Capillary: 176 mg/dL — ABNORMAL HIGH (ref 70–99)
Glucose-Capillary: 173 mg/dL — ABNORMAL HIGH (ref 70–99)
Glucose-Capillary: 168 mg/dL — ABNORMAL HIGH (ref 70–99)
Glucose-Capillary: 227 mg/dL — ABNORMAL HIGH (ref 70–99)
Glucose-Capillary: 209 mg/dL — ABNORMAL HIGH (ref 70–99)

## 2018-09-11 MED ORDER — GLIPIZIDE 5 MG PO TABS
7.5000 mg | ORAL_TABLET | Freq: Two times a day (BID) | ORAL | Status: DC
  Administered 2018-09-11 – 2018-09-15 (×8): 7.5 mg via ORAL
  Filled 2018-09-11 (×8): qty 2

## 2018-09-11 NOTE — Plan of Care (Signed)
  Problem: Consults Goal: RH BRAIN INJURY PATIENT EDUCATION Description Description: See Patient Education module for eduction specifics Outcome: Progressing   Problem: RH BOWEL ELIMINATION Goal: RH STG MANAGE BOWEL WITH ASSISTANCE Description STG Manage Bowel with mod I Assistance.  Outcome: Progressing Goal: RH STG MANAGE BOWEL W/MEDICATION W/ASSISTANCE Description STG Manage Bowel with Medication with mod I Assistance.  Outcome: Progressing   Problem: RH BLADDER ELIMINATION Goal: RH STG MANAGE BLADDER WITH ASSISTANCE Description STG Manage Bladder With min Assistance  Outcome: Progressing   Problem: RH SKIN INTEGRITY Goal: RH STG SKIN FREE OF INFECTION/BREAKDOWN Description Manage skin with min assist   Outcome: Progressing Goal: RH STG MAINTAIN SKIN INTEGRITY WITH ASSISTANCE Description STG Maintain Skin Integrity With min Assistance.  Outcome: Progressing   Problem: RH SAFETY Goal: RH STG ADHERE TO SAFETY PRECAUTIONS W/ASSISTANCE/DEVICE Description STG Adhere to Safety Precautions With cues/reminders Assistance/Device.  Outcome: Progressing   Problem: RH COGNITION-NURSING Goal: RH STG USES MEMORY AIDS/STRATEGIES W/ASSIST TO PROBLEM SOLVE Description STG Uses Memory Aids/Strategies With cues/reminders/reorientation 2/2 visual deficits Assistance to Problem Solve.  Outcome: Progressing   Problem: RH KNOWLEDGE DEFICIT BRAIN INJURY Goal: RH STG INCREASE KNOWLEDGE OF SELF CARE AFTER BRAIN INJURY Description Pts wife will be able to state understanding of information provided and manage care for pt p discharge independently using handouts/educational resources  Outcome: Progressing

## 2018-09-11 NOTE — Progress Notes (Signed)
Pt crawled out of low bed with hands and knees on floor. Pt had bed alarm on and floor mats in place. No injuries. Pt assisted to wheelchair and brought to the nurse's station. Enclosure bed DC'd today. Continue plan of care.   Jason Farrell [name]

## 2018-09-11 NOTE — Progress Notes (Signed)
Social Work Patient ID: Jason Farrell., male   DOB: 1941-04-17, 78 y.o.   MRN: [id number]   Met with pt and wife following team conference on Tuesday and pt notably clearer with cognition.  Both made aware of 3/7 targeted d/c date and supervision to min assist goals.  Wife very hopeful that we will continue to see improvement in cognition and vision.  Remains very involved and encouraging to pt.  Jason Vandenberghe, LCSW

## 2018-09-11 NOTE — Progress Notes (Signed)
Speech Language Pathology Weekly Progress and Session Note  Patient Details  Name: Jason Farrell. MRN: [id number] Date of Birth: 05/13/1941  Beginning of progress report period: September 04, 2018 End of progress report period: September 11, 2018  Today's Date: 09/11/2018 SLP Individual Time: 1000-1100 SLP Individual Time Calculation (min): 60 min  Short Term Goals: Week 1: SLP Short Term Goal 1 (Week 1): Patient will demonstrate sustaind attention to tasks for ~5 minutes with Mod A verbal cus for redirection.  SLP Short Term Goal 1 - Progress (Week 1): Met SLP Short Term Goal 2 (Week 1): Patient will orient to place, month and situation with Max A multimodal cues.  SLP Short Term Goal 2 - Progress (Week 1): Met SLP Short Term Goal 3 (Week 1): Patient will verbalize 2 physical/cognitive changes since hospitalization with Max A multomodal cues.  SLP Short Term Goal 3 - Progress (Week 1): Met SLP Short Term Goal 4 (Week 1): Patient will demonstrate functional problem solving during very basic ADL tasks with Max A multimodal cues.  SLP Short Term Goal 4 - Progress (Week 1): Met    New Short Term Goals: Week 2: SLP Short Term Goal 1 (Week 2): Patient will demonstrate selective attention to tasks for ~30 minutes in a minimally distracting enviornment with Min A verbal cues for redirection. SLP Short Term Goal 2 (Week 2): Patient will orient to place, month and situation with Min A verbal and visual cues.  SLP Short Term Goal 3 (Week 2): Patient will demonstrate functional problem solving during functional tasks with Min A verbal cues.  SLP Short Term Goal 4 (Week 2): Patient will recall new, daily information with Min A verbal and visual cues.  SLP Short Term Goal 5 (Week 2): Patient will self-monitor and correct errors during functional tasks with Min A verbal cues.   Weekly Progress Updates: Patient has made excellent gains and has met 4 of 4 STGs this reporting period.  Currently,  patient requires overall Mod A multimodal cues to complete functional and familiar tasks safely in regards to orientation, problem solving, recall, awareness and attention. Suspect gains in function is largely due to increased visual acuity. Patient and family education ongoing. Patient would benefit from continued skilled SLP intervention to maximize his cognitive functioning and overall functional independence prior to discharge.      Intensity: Minumum of 1-2 x/day, 30 to 90 minutes Frequency: 3 to 5 out of 7 days Duration/Length of Stay: 09/20/18 Treatment/Interventions: Cognitive remediation/compensation;Environmental controls;Internal/external aids;Therapeutic Activities;Patient/family education;Functional tasks;Cueing hierarchy   Daily Session  Skilled Therapeutic Interventions: Skilled treatment session focused on cognitive goals. Upon arrival, patient was awake while upright in the wheelchair but appeared lethargic. SLP facilitated session by providing extra time and Mod A verbal cues for patient to read at the phrase level in regards to visual scanning/acuity. However, patient required total A for problem solving and awareness to perform other visual-perceptual tasks like the clock-drawing task and writing his name and address on 3 lines. Patient requested to use the bathroom and voided on the floor due to inability to follow directions to locate toilet, suspect due to fatigue. Patient placed in enclosure bed at end of session. Continue with current plan of care.      Pain No/Denies Pain   Therapy/Group: Individual Therapy  Jason Farrell 09/11/2018, 6:07 AM

## 2018-09-11 NOTE — Plan of Care (Signed)
  Problem: RH Balance Goal: LTG Patient will maintain dynamic standing with ADLs (OT) Description LTG:  Patient will maintain dynamic standing balance with assist during activities of daily living (OT)  Flowsheets (Taken 09/11/2018 1849) LTG: Pt will maintain dynamic standing balance during ADLs with: Supervision/Verbal cueing Note:  Goal upgraded 2/27 ESD   Problem: Sit to Stand Goal: LTG:  Patient will perform sit to stand in prep for activites of daily living with assistance level (OT) Description LTG:  Patient will perform sit to stand in prep for activites of daily living with assistance level (OT) Flowsheets (Taken 09/11/2018 1849) LTG: PT will perform sit to stand in prep for activites of daily living with assistance level: Supervision/Verbal cueing Note:  Goal upgraded 2/27 ESD   Problem: RH Eating Goal: LTG Patient will perform eating w/assist, cues/equip (OT) Description LTG: Patient will perform eating with assist, with/without cues using equipment (OT) Flowsheets (Taken 09/11/2018 1849) LTG: Pt will perform eating with assistance level of: Supervision/Verbal cueing Note:  Goal upgraded 2/27 ESD   Problem: RH Grooming Goal: LTG Patient will perform grooming w/assist,cues/equip (OT) Description LTG: Patient will perform grooming with assist, with/without cues using equipment (OT) Flowsheets (Taken 09/11/2018 1849) LTG: Pt will perform grooming with assistance level of: Supervision/Verbal cueing Note:  Goal upgraded 2/27 ESD   Problem: RH Bathing Goal: LTG Patient will bathe all body parts with assist levels (OT) Description LTG: Patient will bathe all body parts with assist levels (OT) Flowsheets (Taken 09/11/2018 1849) LTG: Pt will perform bathing with assistance level/cueing: Set up assist ; Supervision/Verbal cueing Note:  Goal upgraded 2/27 ESD   Problem: RH Dressing Goal: LTG Patient will perform upper body dressing (OT) Description LTG Patient will perform  upper body dressing with assist, with/without cues (OT). Flowsheets (Taken 09/11/2018 1849) LTG: Pt will perform upper body dressing with assistance level of: Supervision/Verbal cueing Note:  Goal upgraded 2/27 ESD Goal: LTG Patient will perform lower body dressing w/assist (OT) Description LTG: Patient will perform lower body dressing with assist, with/without cues in positioning using equipment (OT) Flowsheets (Taken 09/11/2018 1849) LTG: Pt will perform lower body dressing with assistance level of: Supervision/Verbal cueing Note:  Goal upgraded 2/27 ESD   Problem: RH Toilet Transfers Goal: LTG Patient will perform toilet transfers w/assist (OT) Description LTG: Patient will perform toilet transfers with assist, with/without cues using equipment (OT) Flowsheets (Taken 09/11/2018 1849) LTG: Pt will perform toilet transfers with assistance level of: Supervision/Verbal cueing Note:  Goal upgraded 2/27 ESD   Problem: RH Tub/Shower Transfers Goal: LTG Patient will perform tub/shower transfers w/assist (OT) Description LTG: Patient will perform tub/shower transfers with assist, with/without cues using equipment (OT) Flowsheets (Taken 09/11/2018 1849) LTG: Pt will perform tub/shower stall transfers with assistance level of: Supervision/Verbal cueing Note:  Goal upgraded 2/27 ESD

## 2018-09-11 NOTE — Progress Notes (Signed)
Family is concerned that pt is not wearing CPAP at night. Will let oncoming staff know to ensure pt wears CPAP.   Jason Farrell W Cristofher Livecchi

## 2018-09-11 NOTE — Progress Notes (Signed)
[place] PHYSICAL MEDICINE & REHABILITATION PROGRESS NOTE   Subjective/Complaints: Patient seen laying in bed this morning.  He states he slept well overnight.  He slept fairly well per sleep chart.  ROS: Limited due to cognition   Objective:   No results found. Recent Labs    09/10/18 0532  WBC 6.3  HGB 11.6*  HCT 37.4*  PLT 608*   Recent Labs    09/10/18 0532  NA 139  K 4.5  CL 104  CO2 25  GLUCOSE 195*  BUN 15  CREATININE 0.81  CALCIUM 9.2    Intake/Output Summary (Last 24 hours) at 09/11/2018 1038 Last data filed at 09/11/2018 0437 Gross per 24 hour  Intake 360 ml  Output 300 ml  Net 60 ml     Physical Exam: Vital Signs Blood pressure (!) 157/68, pulse 90, temperature 98.5 F (36.9 C), resp. rate 17, height 5\' 6"  (1.676 m), weight 86 kg, SpO2 93 %. Constitutional: No distress . Vital signs reviewed. HENT: Normocephalic.  Atraumatic. Eyes: EOMI. No discharge. Cardiovascular: RRR.  No JVD. Respiratory: CTA bilaterally.  Normal effort. GI: BS +. Non-distended. Musc: No edema or tenderness in extremities. Neurologic: Alert and oriented x2 with increased time Motor: Grossly >/4/5 throughout, unchanged Psych: Flat.   Assessment/Plan: 1. 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  Physiatrist is providing close team supervision and 24 hour management of active medical problems listed below.  Physiatrist and rehab team continue to assess barriers to discharge/monitor patient progress toward functional and medical goals  Care Tool:  Bathing    Body parts bathed by patient: Chest, Abdomen   Body parts bathed by helper: Right arm, Left arm, Front perineal area, Buttocks, Right upper leg, Left upper leg, Right lower leg, Left lower leg, Face     Bathing assist Assist Level: Total Assistance - Patient < 25%     Upper Body  Dressing/Undressing Upper body dressing   What is the patient wearing?: Pull over shirt    Upper body assist Assist Level: Maximal Assistance - Patient 25 - 49%    Lower Body Dressing/Undressing Lower body dressing      What is the patient wearing?: Pants, Incontinence brief     Lower body assist Assist for lower body dressing: Total Assistance - Patient < 25%     Toileting Toileting Toileting Activity did not occur (Clothing management and hygiene only): N/A (no void or bm)  Toileting assist Assist for toileting: Moderate Assistance - Patient 50 - 74%     Transfers Chair/bed transfer  Transfers assist     Chair/bed transfer assist level: Contact Guard/Touching assist     Locomotion Ambulation   Ambulation assist      Assist level: Minimal Assistance - Patient > 75% Assistive device: Walker-rolling Max distance: 150   Walk 10 feet activity   Assist     Assist level: Minimal Assistance - Patient > 75% Assistive device: Walker-rolling   Walk 50 feet activity   Assist    Assist level: Minimal Assistance - Patient > 75% Assistive device: Walker-rolling    Walk 150 feet activity   Assist Walk 150 feet activity did not occur: Safety/medical concerns  Assist level: Minimal Assistance - Patient > 75%      Walk 10 feet on uneven surface  activity   Assist Walk 10 feet on uneven surfaces activity did not occur: Safety/medical concerns  Wheelchair     Assist Will patient use wheelchair at discharge?: Yes Type of Wheelchair: Manual    Wheelchair assist level: Minimal Assistance - Patient > 75% Max wheelchair distance: 30'    Wheelchair 50 feet with 2 turns activity    Assist    Wheelchair 50 feet with 2 turns activity did not occur: Safety/medical concerns       Wheelchair 150 feet activity     Assist Wheelchair 150 feet activity did not occur: Safety/medical concerns        Medical Problem List and  Plan: 1.  Decline in self care and mobility secondary to meningioma s/p partial resection with IVH  Continue CIR 2. DVT Prophylaxis/Anticoagulation: Pharmaceutical:Lovenox 3. Pain Management:tylenol prn. 4. Mood:LCSW to follow for evaluation and support. 5. Neuropsych: This patientis notcapable of making decisions on hisown behalf.  Continue enclosure bed for safety 6. Skin/Wound Care:Routine pressure relief measures. 7. Fluids/Electrolytes/Nutrition:Monitor I/O.    hypokalemia: 4.5 on 2/26, supplementation decreased on 2/26  hypomagnesia: 1.7 on 2/24, continue supplementation  prostat for low albumin  encourage PO 8. Recurrent SVT: Resolved. Continueamiodarone and metoprolol.Continue to monitor HR bid.   9. HTN: ON Lisinopril, Microzide, Metoprolol and Norvasc. Monitor BP bid   Labile on 2/27, monitor for trend 10. Delirium/Hallucinations: Continue seroquel bid and titrate as indicated---should improve in appropriate/controlled environment.   Ativan DC'd  Continue enclosure bed required for safety 11. T2DM:   Pt on glucotrol 5mg  bid ac at home.   Glucotrol 2.5mg  bid ac increased to 5 twice daily on 2/24, increased to 7.5 twice daily on 2/27 12. OSA/Hypoxia: Needed oxygen with activity 2/20. Resume CPAP    Weaned supplemental oxygen 13.  Acute blood loss anemia  Hemoglobin 11.6 on 2/26  Continue to monitor  LOS: 7 days A FACE TO FACE EVALUATION WAS PERFORMED  Lincy Belles Lorie Phenix 09/11/2018, 10:38 AM

## 2018-09-11 NOTE — Plan of Care (Signed)
  Problem: RH SKIN INTEGRITY Goal: RH STG SKIN FREE OF INFECTION/BREAKDOWN Description Manage skin with min assist   Outcome: Progressing Goal: RH STG MAINTAIN SKIN INTEGRITY WITH ASSISTANCE Description STG Maintain Skin Integrity With min Assistance.  Outcome: Progressing   Problem: RH SAFETY Goal: RH STG ADHERE TO SAFETY PRECAUTIONS W/ASSISTANCE/DEVICE Description STG Adhere to Safety Precautions With cues/reminders Assistance/Device.  Outcome: Progressing

## 2018-09-11 NOTE — Progress Notes (Signed)
Pt has his own home cpap unit. Pt doesn't need help with his cpap.

## 2018-09-11 NOTE — IPOC Note (Signed)
Unexpected transfer to acute therefore IPOC not completed for that portion of stay

## 2018-09-11 NOTE — Progress Notes (Signed)
Occupational Therapy Session Note  Patient Details  Name: Jason Farrell. MRN: [id number] Date of Birth: 04-May-1941  Today's Date: 09/11/2018 OT Individual Time: 1300-1415 OT Individual Time Calculation (min): 75 min    Short Term Goals: Week 1:  OT Short Term Goal 1 (Week 1): Pt will locate 1 grooming item on the sink with mod questioning cues OT Short Term Goal 2 (Week 1): Pt will complete toilet transfer with mod A of 1 OT Short Term Goal 3 (Week 1): Pt will complete 1 step of LB dressing activity  with mod instructional cues OT Short Term Goal 4 (Week 1): Pt will complete 1 step of toothbrushing activity with mod instructional cues  Skilled Therapeutic Interventions/Progress Updates:    Pt greeted sitting in wc with family present and finishing lunch. Pt's family left and OT finished self-feeding with pt. Pt needed verbal cues to scan and locate food items on his plate, often undershooting to scoop food. Pt declined bathing/dressing after finishing lunch, but did report need for bathroom. Pt ambulated into bathroom w/ RW and min A. Pt initiated clothing management prior to sitting with OT providing CGA for balance. Pt stated he was finished and OT provided total A for peri-care. Pt noted to still be having a BM so OT had pt return to sitting and provided cues to attend to task to complete BM. Pt urinated slightly on clothing and needed to change pants. Pt needed min A to thread pant legs. [employer] activity focused on attention and visual scanning. OT provided guided A to locate 75% of buttons 2/2 over and undershooting. Improved ability to head turn and scan in all 4 quadrants with practice. 3 color sorting activity focused on visual scanning on table. Pt is color blind, but was able to tell the difference between blue, red, and white with min verbal cues. Pt states he can't see green. Pt handoff to PT for next therapy session.   Therapy Documentation Precautions:   Precautions Precautions: Fall Restrictions Weight Bearing Restrictions: No  Pain:  none/denies pain  Therapy/Group: Individual Therapy  Valma Cava 09/11/2018, 1:18 PM

## 2018-09-11 NOTE — Patient Care Conference (Signed)
Inpatient RehabilitationTeam Conference and Plan of Care Update Date: 09/09/2018   Time: 10:45 AM    Patient Name: Jason Farrell.      Medical Record Number: [id number]  Date of Birth: 1941-06-03 Sex: Male         Room/Bed: 4W04C/4W04C-01 Payor Info: Payor: MEDICARE / Plan: MEDICARE PART A AND B / Product Type: *No Product type* /    Admitting Diagnosis: seizures  Admit Date/Time:  09/04/2018  4:13 PM Admission Comments: No comment available   Primary Diagnosis:  <principal problem not specified> Principal Problem: <principal problem not specified>  Patient Active Problem List   Diagnosis Date Noted  . Acute delirium   . Labile blood glucose   . OSA (obstructive sleep apnea)   . Diabetes mellitus type 2 in nonobese (HCC)   . Labile blood pressure   . Hypomagnesemia   . Pressure injury of skin 09/02/2018  . PRES (posterior reversible encephalopathy syndrome) 08/30/2018  . Acute blood loss anemia   . Hypoalbuminemia due to protein-calorie malnutrition ([place])   . Hypokalemia   . Leukocytosis   . Encephalopathy   . SVT (supraventricular tachycardia) ([place])   . 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 Debility   . Meningioma ([place])   . 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 Senile purpura ([place]) 02/21/2018  . Type 2 diabetes mellitus with microalbuminuria, without long-term current use of insulin ([place]) 02/21/2018  . Controlled type 2 diabetes mellitus without complication, without long-term current use of insulin ([place]) 11/11/2017  . 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 Obstructive sleep apnea on CPAP 12/09/2015  . Acid reflux 05/06/2015  . Arthritis, degenerative 05/06/2015  . CAD in native artery 01/25/2015  . Diabetes mellitus type 2 in obese ([place]) 08/16/2014  . Hyperlipidemia 08/16/2014  . Essential hypertension 08/16/2014  . Morbid obesity ([place]) 08/16/2014  . Stopped smoking with greater than 30 pack year history 08/16/2014  . COLD (chronic  obstructive lung disease) ([place]) 08/16/2014    Expected Discharge Date: Expected Discharge Date: 09/20/18  Team Members Present: Physician leading conference: Dr. Delice Lesch Social Worker Present: Lennart Pall, LCSW PT Present: Roderic Ovens, PT OT Present: Cherylynn Ridges, OT SLP Present: Weston Anna, SLP     Current Status/Progress Goal Weekly Team Focus  Medical   Decline in self care and mobility secondary to meningioma s/p partial resection with IVH  Improve mobility, cognition, DM/HTN, hypo Mg/K  See above   Bowel/Bladder   Continent of bowel and bladder with periods of incontinence  Continue to be continent of bowel and bladder  Monitor urinary and bowel pattern, timed toileting, assist with toileting needs   Swallow/Nutrition/ Hydration             ADL's   min/mod A overall  Min A- will upgrade goals at weekly  attention, initiation, visual scanning, modified bathing/dressing, actviity tolerance, transfers   Mobility   min/mod A transfers, min A gait  supervision gait, min A stairs  activity tolerance, balance, strength, attention   Communication             Safety/Cognition/ Behavioral Observations  Mod-Max A  Min A   attenton, orientation, recall, problem solving    Pain   Denies pain  Remain free of pain, pain <3/10  Assess pain every shift and as needed   Skin   bruising to abdomen, MASD to groin, Stage II to buttocks-barrier cream, foam dressing intact, microguard powder  Healing to impaired areas, no new skin  issues  Assess skin every shift and prn    Rehab Goals Patient on target to meet rehab goals: Yes *See Care Plan and progress notes for long and short-term goals.     Barriers to Discharge  Current Status/Progress Possible Resolutions Date Resolved   Physician    Medical stability;Other (comments);Behavior  Enclosure bed  See above  Therapies, optimize DM/HTN, supplement Mg/K      Nursing                  PT                    OT                   SLP                SW                Discharge Planning/Teaching Needs:  Return home with wife who can provide 24/7 support.  Teaching is ongoing with wife who is here daily.   Team Discussion:  Team agreed to keep pt in enclosure bed at this time.  Adjusting BP and DM meds.  Improved overall orientation seen by team today.  Still variable with continence.  Min-mod assist overall with PT/OT and mod assist with ST.  Goals for supervision overall except min for stairs.  Revisions to Treatment Plan:  NA    Continued Need for Acute Rehabilitation Level of Care: The patient requires daily medical management by a physician with specialized training in physical medicine and rehabilitation for the following conditions: Daily direction of a multidisciplinary physical rehabilitation program to ensure safe treatment while eliciting the highest outcome that is of practical value to the patient.: Yes Daily medical management of patient stability for increased activity during participation in an intensive rehabilitation regime.: Yes Daily analysis of laboratory values and/or radiology reports with any subsequent need for medication adjustment of medical intervention for : Neurological problems;Blood pressure problems;Diabetes problems;Other   I attest that I was present, lead the team conference, and concur with the assessment and plan of the team.   Jelena Malicoat 09/11/2018, 11:52 AM

## 2018-09-11 NOTE — Progress Notes (Signed)
Physical Therapy Weekly Progress Note  Patient Details  Name: Jason Farrell. MRN: [id number] Date of Birth: 1941-06-27  Beginning of progress report period: September 05, 2018 End of progress report period: September 11, 2018  Today's Date: 09/11/2018 PT Individual Time: 7737-5051 PT Individual Time Calculation (min): 54 min   Pt handed off from OT, states fatigue but is agreeable to therapy.  Gait training with RW with max cues to avoid obstacles with min A throughout unit up to 150'.  Standing balance with horseshoe toss task with supervision with 1 UE support.  Standing horseshoe task on foam with CGA with 1 UE support.  Standing on foam without UE support for reaching task with min A.  Standing squats, heel raises for strengthening.  Standing balance eyes open, eyes closed with close supervision, standing balance with narrow BOS with CGA.  nustep x 6 minutes for UE/LE strengthening level 4.  Pt left in bed with needs at hand, alarm set, positioned for comfort.  Patient has met 3 of 3 short term goals.  Pt improving mobility and cognition and progressing toward long term goals. Pt currently min A for gait with RW and transfers and has transitioned out of veil bed.  Patient continues to demonstrate the following deficits muscle weakness, unbalanced muscle activation, decreased coordination and decreased motor planning, decreased attention, decreased awareness, decreased problem solving, decreased safety awareness, decreased memory and delayed processing and decreased standing balance, decreased postural control and decreased balance strategies and therefore will continue to benefit from skilled PT intervention to increase functional independence with mobility.  Patient progressing toward long term goals..  Continue plan of care.  PT Short Term Goals Week 1:  PT Short Term Goal 1 (Week 1): PAtient will consistently demonstrate bed mobility with mod A PT Short Term Goal 1 - Progress (Week  1): Met PT Short Term Goal 2 (Week 1): Patient to ambulate 100' min A PT Short Term Goal 2 - Progress (Week 1): Met PT Short Term Goal 3 (Week 1): Patient will negotiate 4 6" steps with rails min A PT Short Term Goal 3 - Progress (Week 1): Met Week 2:  PT Short Term Goal 1 (Week 2): = LTG  Skilled Therapeutic Interventions/Progress Updates:  Ambulation/gait training;Balance/vestibular training;Cognitive remediation/compensation;DME/adaptive equipment instruction;Functional mobility training;Patient/family education;UE/LE Strength taining/ROM;UE/LE Coordination activities;Therapeutic Activities;Therapeutic Exercise;Stair training;Visual/perceptual remediation/compensation;Wheelchair propulsion/positioning;Discharge planning;Neuromuscular re-education;Pain management;Splinting/orthotics;Community reintegration   Therapy Documentation Precautions:  Precautions Precautions: Fall Restrictions Weight Bearing Restrictions: No Pain:  pt c/o back pain, eases with rest and repositioning  Therapy/Group: Individual Therapy  Council Munguia 09/11/2018, 3:07 PM

## 2018-09-11 NOTE — Progress Notes (Signed)
Occupational Therapy Weekly Progress Note  Patient Details  Name: Jason Farrell. MRN: [id number] Date of Birth: 05-12-1941  Beginning of progress report period: September 05, 2018 End of progress report period: September 11, 2018   Patient has met 4 of 4 short term goals.  Pt has made steady progress towards OT goals this week. He has demonstrated improved sit<>stand and functional ambulation within BADL tasks. Pt has also been progressing within bathing/dressing tasks with improved sequencing and initiation within familiar task. Pt continues to have difficulty with vision loss and diplopia, requiring mod/max cuing for compensatory strategies. Continue current POC.   Patient continues to demonstrate the following deficits: muscle weakness, impaired timing and sequencing, decreased coordination and decreased motor planning, decreased visual acuity, decreased visual perceptual skills and field cut, decreased midline orientation, decreased initiation, decreased attention, decreased awareness, decreased problem solving, decreased safety awareness, decreased memory and delayed processing and decreased standing balance, decreased postural control and decreased balance strategies and therefore will continue to benefit from skilled OT intervention to enhance overall performance with BADL.  Patient progressing toward long term goals..  Plan of care revisions: Goals upgraded 2/2 patient progress.  OT Short Term Goals Week 1:  OT Short Term Goal 1 (Week 1): Pt will locate 1 grooming item on the sink with mod questioning cues OT Short Term Goal 1 - Progress (Week 1): Met OT Short Term Goal 2 (Week 1): Pt will complete toilet transfer with mod A of 1 OT Short Term Goal 2 - Progress (Week 1): Met OT Short Term Goal 3 (Week 1): Pt will complete 1 step of LB dressing activity  with mod instructional cues OT Short Term Goal 3 - Progress (Week 1): Met OT Short Term Goal 4 (Week 1): Pt will complete 1 step  of toothbrushing activity with mod instructional cues OT Short Term Goal 4 - Progress (Week 1): Met Week 2:  OT Short Term Goal 1 (Week 2): STG=LTG 2/2 ELOS  Therapy Documentation Precautions:  Precautions Precautions: Fall Restrictions Weight Bearing Restrictions: No  Therapy/Group: Individual Therapy  Valma Cava 09/11/2018, 6:55 PM

## 2018-09-12 ENCOUNTER — Inpatient Hospital Stay (HOSPITAL_COMMUNITY): Payer: Managed Care, Other (non HMO) | Admitting: Speech Pathology

## 2018-09-12 ENCOUNTER — Inpatient Hospital Stay (HOSPITAL_COMMUNITY): Payer: Managed Care, Other (non HMO) | Admitting: Occupational Therapy

## 2018-09-12 ENCOUNTER — Inpatient Hospital Stay (HOSPITAL_COMMUNITY): Payer: Managed Care, Other (non HMO)

## 2018-09-12 DIAGNOSIS — W19XXXA Unspecified fall, initial encounter: Secondary | ICD-10-CM

## 2018-09-12 DIAGNOSIS — R9431 Abnormal electrocardiogram [ECG] [EKG]: Secondary | ICD-10-CM

## 2018-09-12 DIAGNOSIS — G479 Sleep disorder, unspecified: Secondary | ICD-10-CM

## 2018-09-12 LAB — GLUCOSE, CAPILLARY
GLUCOSE-CAPILLARY: 258 mg/dL — AB (ref 70–99)
Glucose-Capillary: 172 mg/dL — ABNORMAL HIGH (ref 70–99)
Glucose-Capillary: 208 mg/dL — ABNORMAL HIGH (ref 70–99)
Glucose-Capillary: 137 mg/dL — ABNORMAL HIGH (ref 70–99)

## 2018-09-12 MED ORDER — QUETIAPINE FUMARATE 50 MG PO TABS
75.0000 mg | ORAL_TABLET | Freq: Every day | ORAL | Status: DC
  Administered 2018-09-12 – 2018-09-14 (×3): 75 mg via ORAL
  Filled 2018-09-12 (×3): qty 1

## 2018-09-12 NOTE — Progress Notes (Signed)
Speech Language Pathology Daily Session Note  Patient Details  Name: Jason Farrell. MRN: [id number] Date of Birth: 07/31/1940  Today's Date: 09/12/2018 SLP Individual Time: 0930-1015 SLP Individual Time Calculation (min): 45 min  Short Term Goals: Week 2: SLP Short Term Goal 1 (Week 2): Patient will demonstrate selective attention to tasks for ~30 minutes in a minimally distracting enviornment with Min A verbal cues for redirection. SLP Short Term Goal 2 (Week 2): Patient will orient to place, month and situation with Min A verbal and visual cues.  SLP Short Term Goal 3 (Week 2): Patient will demonstrate functional problem solving during functional tasks with Min A verbal cues.  SLP Short Term Goal 4 (Week 2): Patient will recall new, daily information with Min A verbal and visual cues.  SLP Short Term Goal 5 (Week 2): Patient will self-monitor and correct errors during functional tasks with Min A verbal cues.   Skilled Therapeutic Interventions: Skilled treatment session focused on cognitive goals. SLP facilitated session by providing Mod A verbal cues for problem solving during a 4-step picture sequencing task with extra time needed for visual scanning, etc. Patient continues to demonstrate double vision at times. Patient was independently oriented to place, time and situation with Mod I. Patient left upright in wheelchair with all needs within reach and family present. Continue with current plan of care.      Pain Pain Assessment Pain Scale: 0-10 Pain Score: 0-No pain  Therapy/Group: Individual Therapy  Novak Stgermaine 09/12/2018, 3:07 PM

## 2018-09-12 NOTE — Progress Notes (Addendum)
[facility] PHYSICAL MEDICINE & REHABILITATION PROGRESS NOTE   Subjective/Complaints: Patient seen lying in bed this morning.  He did not sleep well overnight per nursing.  He was noted to have a fall being found on the floor and disoriented overnight.  Is included but was discontinued, but need to be reordered.  ROS: Limited due to cognition   Objective:   No results found. Recent Labs    09/10/18 0532  WBC 6.3  HGB 11.6*  HCT 37.4*  PLT 608*   Recent Labs    09/10/18 0532  NA 139  K 4.5  CL 104  CO2 25  GLUCOSE 195*  BUN 15  CREATININE 0.81  CALCIUM 9.2    Intake/Output Summary (Last 24 hours) at 09/12/2018 0813 Last data filed at 09/12/2018 0700 Gross per 24 hour  Intake 1200 ml  Output 550 ml  Net 650 ml     Physical Exam: Vital Signs Blood pressure 135/77, pulse 94, temperature 98.1 F (36.7 C), resp. rate 18, height 5\' 6"  (1.676 m), weight 86 kg, SpO2 97 %. Constitutional: No distress . Vital signs reviewed. HENT: Normocephalic.  Atraumatic. Eyes: EOMI. No discharge. Cardiovascular: RRR.  No JVD. Respiratory: CTA bilaterally.  Normal effort. GI: BS +. Non-distended. Musc: No edema or tenderness in extremities. Neurologic: Alert and oriented x1 Motor: Grossly >/4/5 throughout, stable Psych: Flat.   Assessment/Plan: 1. 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  Physiatrist is providing close team supervision and 24 hour management of active medical problems listed below.  Physiatrist and rehab team continue to assess barriers to discharge/monitor patient progress toward functional and medical goals  Care Tool:  Bathing    Body parts bathed by patient: Chest, Abdomen   Body parts bathed by helper: Right arm, Left arm, Front perineal area, Buttocks, Right upper leg, Left upper leg, Right lower leg, Left lower leg, Face     Bathing assist  Assist Level: Total Assistance - Patient < 25%     Upper Body Dressing/Undressing Upper body dressing   What is the patient wearing?: Pull over shirt    Upper body assist Assist Level: Maximal Assistance - Patient 25 - 49%    Lower Body Dressing/Undressing Lower body dressing      What is the patient wearing?: Pants, Incontinence brief     Lower body assist Assist for lower body dressing: Total Assistance - Patient < 25%     Toileting Toileting Toileting Activity did not occur (Clothing management and hygiene only): N/A (no void or bm)  Toileting assist Assist for toileting: Moderate Assistance - Patient 50 - 74%     Transfers Chair/bed transfer  Transfers assist     Chair/bed transfer assist level: Contact Guard/Touching assist     Locomotion Ambulation   Ambulation assist      Assist level: Minimal Assistance - Patient > 75% Assistive device: Walker-rolling Max distance: 150   Walk 10 feet activity   Assist     Assist level: Minimal Assistance - Patient > 75% Assistive device: Walker-rolling   Walk 50 feet activity   Assist    Assist level: Minimal Assistance - Patient > 75% Assistive device: Walker-rolling    Walk 150 feet activity   Assist Walk 150 feet activity did not occur: Safety/medical concerns  Assist level: Minimal Assistance - Patient > 75%      Walk 10 feet on uneven surface  activity  Assist Walk 10 feet on uneven surfaces activity did not occur: Safety/medical concerns         Wheelchair     Assist Will patient use wheelchair at discharge?: Yes Type of Wheelchair: Manual    Wheelchair assist level: Minimal Assistance - Patient > 75% Max wheelchair distance: 46'    Wheelchair 50 feet with 2 turns activity    Assist    Wheelchair 50 feet with 2 turns activity did not occur: Safety/medical concerns       Wheelchair 150 feet activity     Assist Wheelchair 150 feet activity did not occur:  Safety/medical concerns        Medical Problem List and Plan: 1.  Decline in self care and mobility secondary to meningioma s/p partial resection with IVH  Continue CIR 2. DVT Prophylaxis/Anticoagulation: Pharmaceutical:Lovenox 3. Pain Management:tylenol prn. 4. Mood:LCSW to follow for evaluation and support. 5. Neuropsych: This patientis notcapable of making decisions on hisown behalf.  Continue enclosure bed for safety 6. Skin/Wound Care:Routine pressure relief measures. 7. Fluids/Electrolytes/Nutrition:Monitor I/O.    hypokalemia: 4.5 on 2/26, supplementation decreased on 2/26  hypomagnesia: 1.7 on 2/24, continue supplementation  Labs ordered for Monday  prostat for low albumin  encourage PO 8. Recurrent SVT: Resolved. Continueamiodarone and metoprolol.Continue to monitor HR bid.   9. HTN: ON Lisinopril, Microzide, Metoprolol and Norvasc. Monitor BP bid   Controlled on 2/28 10. Delirium/Hallucinations:   Continue seroquel bid-we will discuss with pharmacy  Ativan DC'd 11. T2DM:   Pt on glucotrol 5mg  bid ac at home.   Glucotrol 2.5mg  bid ac increased to 5 twice daily on 2/24, increased to 7.5 twice daily on 2/27  Elevated on 2/28, consider further increase if necessary 12. OSA/Hypoxia: Needed oxygen with activity 2/20. Resume CPAP    Weaned supplemental oxygen 13.  Acute blood loss anemia  Hemoglobin 11.6 on 2/26  Continue to monitor 14.  Sleep disturbance  ECG reviewed, prolonged QTc.  Will repeat ECG.  Seroquel increased.  Discussed with pharmacy.  15.  Fall  No reported trauma 16. Prolonged Qt  See above  Repeat ECG ordered, will follow up with Cards regarding [place] if QT remains prolonged  LOS: 8 days A FACE TO FACE EVALUATION WAS PERFORMED  Ankit Lorie Phenix 09/12/2018, 8:13 AM

## 2018-09-12 NOTE — Progress Notes (Signed)
Occupational Therapy Session Note  Patient Details  Name: Jason Farrell. MRN: [id number] Date of Birth: 1941-02-09  Today's Date: 09/12/2018 OT Individual Time: 0730-0830 OT Individual Time Calculation (min): 60 min   Short Term Goals: Week 2:  OT Short Term Goal 1 (Week 2): STG=LTG 2/2 ELOS  Skilled Therapeutic Interventions/Progress Updates:    Pt greeted asleep in enclosure bed, easy to wake, and agreeable to OT treatment session. Pt with difficulty getting out of enclosure bed 2/2 position of side panels. Stand-pivots throughout session with min A and verbal cues-bed>wc>tub bench>wc. Pt initiated bathing without cues, min A to wash buttocks for thoroughness. Pt with slight posterior LOB when standing to wash peri-area requiring min A to correct. Worked on dressing strategies from wc with pt stating he needed assistance with socks and shoes PTA. Pt was able to thread LLE into pants, with min A to thread R LE. Pt then stood and pulled pants over hips with CGA for balance. OT provided supervision for breakfast with focus on visual scanning on tray to locate food items with min cues today. Pt stated "we are in [place], at the [facility]." Nursing entered to administer medications so pt left in care of nursing staff.   Therapy Documentation Precautions:  Precautions Precautions: Fall Restrictions Weight Bearing Restrictions: No Pain:  none/denies pain   Therapy/Group: Individual Therapy  Valma Cava 09/12/2018, 8:16 AM

## 2018-09-12 NOTE — Progress Notes (Signed)
Speech Language Pathology Daily Session Note  Patient Details  Name: Jason Farrell. MRN: [id number] Date of Birth: 11-29-1940  Today's Date: 09/12/2018 SLP Individual Time: 1030-1100 SLP Individual Time Calculation (min): 30 min  Short Term Goals: Week 2: SLP Short Term Goal 1 (Week 2): Patient will demonstrate selective attention to tasks for ~30 minutes in a minimally distracting enviornment with Min A verbal cues for redirection. SLP Short Term Goal 2 (Week 2): Patient will orient to place, month and situation with Min A verbal and visual cues.  SLP Short Term Goal 3 (Week 2): Patient will demonstrate functional problem solving during functional tasks with Min A verbal cues.  SLP Short Term Goal 4 (Week 2): Patient will recall new, daily information with Min A verbal and visual cues.  SLP Short Term Goal 5 (Week 2): Patient will self-monitor and correct errors during functional tasks with Min A verbal cues.   Skilled Therapeutic Interventions:  Skilled treatment session focused on cognition goals. SLP facilitated session by providing Mod A to place numbers in sequential order on large calendar. Specifically, pt benefited from cues to visually locate numbers and the slots in which he placed them. As task progressed and visual material became more complex, pt experienced more difficulty visually placing numbers. SLP facilitiated by holding number in her hand and pt using a hand over hand to guide SLP's hand to appropriate location. Pt able to recall biographical information with questions cues only (previous day he required Mod A cues). Pt returned to room, left upright in wheelchair, lap belt alarm on and family present. Continue per current plan of care.      Pain Pain Assessment Pain Scale: 0-10 Pain Score: 0-No pain  Therapy/Group: Individual Therapy  Lacretia Tindall 09/12/2018, 11:37 AM

## 2018-09-12 NOTE — Progress Notes (Signed)
Physical Therapy Session Note  Patient Details  Name: Jason Farrell. MRN: [id number] Date of Birth: 09/09/1940  Today's Date: 09/12/2018 PT Individual Time: 1300-1400 PT Individual Time Calculation (min): 60 min   Short Term Goals:  Week 2:  PT Short Term Goal 1 (Week 2): = LTG  Skilled Therapeutic Interventions/Progress Updates:   Pt asleep in bed with CPAP on.  He easily awakened to his name being called.  Pt removed CPAP .  Rolling R with min assist.  In R side lying: hip flex/extension bil, x 10.  R side lying to sitting with mod assist to get bil feet out of enclosrue bed and raise trunk.  In sitting, R/L lateral leans x 5 each with min assist intermittently R, tactile cues L.  Stand pivot with RW to L, min assist, mod cues.  W/c> NuStep as above.  neuromuscular re-education via forced use, multimodal cues at level 5, x 5 minutes for alternating reciprocal movement.  To decrease posterior tilt and facilitate forward wt shift: sitting astride backless bench (scooted forward out of w/c onto bench with tactile cues) , pt reached forward out of BOS to tap target with R and L hand, x 10 each.  He scooted backwards into w/c from bench with hands in front of him encouraging forward wt shift.   Gait training with RW on level tile x 150' with min guard assist.  Pt reported he needed to use toilet.  Pt stood to urinate with close supervision.  He Presenter, broadcasting with supervision.  Continent of urine.  Pt left resting in with seat belt alarm set and Candice, NT attending him for lunch.      Therapy Documentation Precautions:  Precautions Precautions: Fall Restrictions Weight Bearing Restrictions: No  Pain: no c/o       Therapy/Group: Individual Therapy  Natascha Edmonds 09/12/2018, 4:52 PM

## 2018-09-12 NOTE — Significant Event (Signed)
Was the fall witnessed: no, pt found sitting upright against his bed, on floor mat, pt previously in low bed.   Patient condition before and after the fall: patient confused, irritable, restless, hallucinating, pulling at CPAP equipment placed by this nurse, delusional regarding equipment, referring to CPAP tubing as "pipes", prior to fall.   Behavior unchanged after fall. Patient alert and continues to be disoriented, verbally responsive to staff. Pt presents with no safety awareness, difficult to redirect, irritable with reorientation by staff.   Patient's reaction to the fall: Pt denies pain, denies hitting head, able to move extremities without difficulty, no injuries noted. Pt states "I didn't fall, I just got out of bed".    Name of the doctor that was notified including date and time: Judeth Porch  09/11/18 2225  Any interventions and vital signs: Vital signs and blood glucose obtained, physical assessment for injuries, patient assisted to wheelchair and brought to nurses station with staff for closer observation. Enclosure bed initiated for unsafe behaviors and removal of equipment.   VS at time of fall-T-97.5 HR-116 Resps-19, BP-96/60, O2 sats-97% on room air.  FSBS-209.  VS post fall (2hrs)-T-97.4, HR-96, Resps-18, BP-116/78, O2 sats-100% on room air

## 2018-09-13 ENCOUNTER — Inpatient Hospital Stay (HOSPITAL_COMMUNITY): Payer: Managed Care, Other (non HMO) | Admitting: Occupational Therapy

## 2018-09-13 LAB — GLUCOSE, CAPILLARY
Glucose-Capillary: 112 mg/dL — ABNORMAL HIGH (ref 70–99)
Glucose-Capillary: 179 mg/dL — ABNORMAL HIGH (ref 70–99)
Glucose-Capillary: 189 mg/dL — ABNORMAL HIGH (ref 70–99)
Glucose-Capillary: 149 mg/dL — ABNORMAL HIGH (ref 70–99)

## 2018-09-13 NOTE — Progress Notes (Signed)
Occupational Therapy Session Note  Patient Details  Name: Jason Farrell. MRN: [id number] Date of Birth: Jun 23, 1941  Today's Date: 09/13/2018 OT Individual Time: 0715-0815 OT Individual Time Calculation (min): 60 min   Short Term Goals: Week 2:  OT Short Term Goal 1 (Week 2): STG=LTG 2/2 ELOS  Skilled Therapeutic Interventions/Progress Updates:    Pt greeted semi-reclined in enclosure bed, awake, with nursing getting meds ready. Pt reported need to go to the bathroom. Pt came to sitting EOB with mod A 2/2 enclosure bed panels. Shoes donned sitting EOB with max A from OT 2/2 urgency. Pt ambulated to bathroom with RW and min A. Pt needed min verbal cues for positioning of RW over toilet to stand and urinate. Tested vision again with pt continuing to have double vision even with glasses taped. Removed tape and had pt close his L eye, and this stopped his double vision. Eye patch placed on L eye. Pt then completed grooming task at the sink with improved accuracy to locate items and verbal cues for head turns. Blocked practice for LB dressing strategies using sock-aid and shoe horn. Pt will need more practice with both devices, but was able to don socks today with sock-aid and verbal cues for technique. Pt left seated in wc at end of session at the nurses station in care of nurse tech.   Therapy Documentation Precautions:  Precautions Precautions: Fall Restrictions Weight Bearing Restrictions: No Pain:  none/denies pain   Therapy/Group: Individual Therapy  Valma Cava 09/13/2018, 8:10 AM

## 2018-09-13 NOTE — Progress Notes (Signed)
[place] PHYSICAL MEDICINE & REHABILITATION PROGRESS NOTE   Subjective/Complaints: At Nsg station, no c/os  ROS: Limited due to cognition   Objective:   No results found. No results for input(s): WBC, HGB, HCT, PLT in the last 72 hours. No results for input(s): NA, K, CL, CO2, GLUCOSE, BUN, CREATININE, CALCIUM in the last 72 hours.  Intake/Output Summary (Last 24 hours) at 09/13/2018 1132 Last data filed at 09/13/2018 0831 Gross per 24 hour  Intake 680 ml  Output 225 ml  Net 455 ml     Physical Exam: Vital Signs Blood pressure (!) 147/70, pulse 79, temperature 97.8 F (36.6 C), temperature source Oral, resp. rate 18, height 5\' 6"  (1.676 m), weight 86 kg, SpO2 98 %. Constitutional: No distress . Vital signs reviewed. HENT: Normocephalic.  Atraumatic. Eyes: EOMI. No discharge. Cardiovascular: RRR.  No JVD. Respiratory: CTA bilaterally.  Normal effort. GI: BS +. Non-distended. Musc: No edema or tenderness in extremities. Neurologic: Alert and oriented x1 Motor: Grossly >/4/5 throughout, stable Psych: Flat. Oriented to person and [facility] but not time  Assessment/Plan: 1. 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  Physiatrist is providing close team supervision and 24 hour management of active medical problems listed below.  Physiatrist and rehab team continue to assess barriers to discharge/monitor patient progress toward functional and medical goals  Care Tool:  Bathing    Body parts bathed by patient: Chest, Abdomen   Body parts bathed by helper: Right arm, Left arm, Front perineal area, Buttocks, Right upper leg, Left upper leg, Right lower leg, Left lower leg, Face     Bathing assist Assist Level: Total Assistance - Patient < 25%     Upper Body Dressing/Undressing Upper body dressing   What is the patient wearing?: Pull over shirt    Upper  body assist Assist Level: Maximal Assistance - Patient 25 - 49%    Lower Body Dressing/Undressing Lower body dressing      What is the patient wearing?: Pants, Incontinence brief     Lower body assist Assist for lower body dressing: Total Assistance - Patient < 25%     Toileting Toileting Toileting Activity did not occur (Clothing management and hygiene only): N/A (no void or bm)  Toileting assist Assist for toileting: Moderate Assistance - Patient 50 - 74%     Transfers Chair/bed transfer  Transfers assist     Chair/bed transfer assist level: Contact Guard/Touching assist     Locomotion Ambulation   Ambulation assist      Assist level: Minimal Assistance - Patient > 75% Assistive device: Walker-rolling Max distance: 150   Walk 10 feet activity   Assist     Assist level: Minimal Assistance - Patient > 75% Assistive device: Walker-rolling   Walk 50 feet activity   Assist    Assist level: Minimal Assistance - Patient > 75% Assistive device: Walker-rolling    Walk 150 feet activity   Assist Walk 150 feet activity did not occur: Safety/medical concerns  Assist level: Minimal Assistance - Patient > 75%      Walk 10 feet on uneven surface  activity   Assist Walk 10 feet on uneven surfaces activity did not occur: Safety/medical concerns         Wheelchair     Assist Will patient use wheelchair at discharge?: Yes Type of Wheelchair: Manual    Wheelchair assist level: Minimal Assistance - Patient >  75% Max wheelchair distance: 36'    Wheelchair 50 feet with 2 turns activity    Assist    Wheelchair 50 feet with 2 turns activity did not occur: Safety/medical concerns       Wheelchair 150 feet activity     Assist Wheelchair 150 feet activity did not occur: Safety/medical concerns        Medical Problem List and Plan: 1.  Decline in self care and mobility secondary to meningioma s/p partial resection with  IVH  Continue CIR PT,OT, SLP 2. DVT Prophylaxis/Anticoagulation: Pharmaceutical:Lovenox 3. Pain Management:tylenol prn. 4. Mood:LCSW to follow for evaluation and support. 5. Neuropsych: This patientis notcapable of making decisions on hisown behalf.  Continue enclosure bed for safety- has poor safety awareness and reduce balance 6. Skin/Wound Care:Routine pressure relief measures. 7. Fluids/Electrolytes/Nutrition:Monitor I/O.    hypokalemia: 4.5 on 2/26, supplementation decreased on 2/26  hypomagnesia: 1.7 on 2/24, continue supplementation  Labs ordered for Monday  prostat for low albumin  encourage PO 8. Recurrent SVT: Resolved. Continueamiodarone and metoprolol.Continue to monitor HR bid.   9. HTN: ON Lisinopril, Microzide, Metoprolol and Norvasc. Monitor BP bid   Controlled on 2/29 10. Delirium/Hallucinations:   Continue seroquel bid-we will discuss with pharmacy  Ativan DC'd 11. T2DM:   Pt on glucotrol 5mg  bid ac at home.   Glucotrol 2.5mg  bid ac increased to 5 twice daily on 2/24, increased to 7.5 twice daily on 2/27   CBG (last 3)  Recent Labs    09/12/18 1634 09/12/18 2151 09/13/18 0613  GLUCAP 137* 258* 112*  lability noted will cont to monitor 12. OSA/Hypoxia: Needed oxygen with activity 2/20. Resume CPAP    Weaned supplemental oxygen 13.  Acute blood loss anemia  Hemoglobin 11.6 on 2/26  Continue to monitor 14.  Sleep disturbance  ECG reviewed, prolonged QTc.  Will repeat ECG QTc 513 .  Seroquel increased.  Discussed with pharmacy.  15.  Fall  No reported trauma 16. Prolonged Qt  See above  Repeat ECG ordered, will follow up with Cards regarding Amio if QT remains prolonged  LOS: 9 days A FACE TO FACE EVALUATION WAS PERFORMED  [name] 09/13/2018, 11:32 AM

## 2018-09-14 ENCOUNTER — Inpatient Hospital Stay (HOSPITAL_COMMUNITY): Payer: Managed Care, Other (non HMO)

## 2018-09-14 ENCOUNTER — Inpatient Hospital Stay (HOSPITAL_COMMUNITY): Payer: Managed Care, Other (non HMO) | Admitting: Occupational Therapy

## 2018-09-14 LAB — GLUCOSE, CAPILLARY
Glucose-Capillary: 140 mg/dL — ABNORMAL HIGH (ref 70–99)
Glucose-Capillary: 181 mg/dL — ABNORMAL HIGH (ref 70–99)
Glucose-Capillary: 136 mg/dL — ABNORMAL HIGH (ref 70–99)
Glucose-Capillary: 226 mg/dL — ABNORMAL HIGH (ref 70–99)

## 2018-09-14 NOTE — Progress Notes (Signed)
[place] PHYSICAL MEDICINE & REHABILITATION PROGRESS NOTE   Subjective/Complaints: At Nsg station, no c/os, no evidence of agitation.  ROS: Limited due to cognition   Objective:   No results found. No results for input(s): WBC, HGB, HCT, PLT in the last 72 hours. No results for input(s): NA, K, CL, CO2, GLUCOSE, BUN, CREATININE, CALCIUM in the last 72 hours.  Intake/Output Summary (Last 24 hours) at 09/14/2018 1038 Last data filed at 09/14/2018 0800 Gross per 24 hour  Intake 955 ml  Output 400 ml  Net 555 ml     Physical Exam: Vital Signs Blood pressure (!) 153/76, pulse 87, temperature 98.5 F (36.9 C), resp. rate 18, height 5\' 6"  (1.676 m), weight 86 kg, SpO2 97 %. Constitutional: No distress . Vital signs reviewed. HENT: Normocephalic.  Atraumatic. Eyes: EOMI. No discharge. Cardiovascular: RRR.  No JVD. Respiratory: CTA bilaterally.  Normal effort. GI: BS +. Non-distended. Musc: No edema or tenderness in extremities. Neurologic: Alert and oriented x1 Motor: Grossly >/4/5 throughout, stable Psych: Flat. Oriented to person and hospital, not time  Assessment/Plan: 1. 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  Physiatrist is providing close team supervision and 24 hour management of active medical problems listed below.  Physiatrist and rehab team continue to assess barriers to discharge/monitor patient progress toward functional and medical goals  Care Tool:  Bathing    Body parts bathed by patient: Chest, Abdomen   Body parts bathed by helper: Right arm, Left arm, Front perineal area, Buttocks, Right upper leg, Left upper leg, Right lower leg, Left lower leg, Face     Bathing assist Assist Level: Total Assistance - Patient < 25%     Upper Body Dressing/Undressing Upper body dressing   What is the patient wearing?: Pull over shirt    Upper body  assist Assist Level: Maximal Assistance - Patient 25 - 49%    Lower Body Dressing/Undressing Lower body dressing      What is the patient wearing?: Pants, Incontinence brief     Lower body assist Assist for lower body dressing: Total Assistance - Patient < 25%     Toileting Toileting Toileting Activity did not occur (Clothing management and hygiene only): N/A (no void or bm)  Toileting assist Assist for toileting: Moderate Assistance - Patient 50 - 74%     Transfers Chair/bed transfer  Transfers assist     Chair/bed transfer assist level: Contact Guard/Touching assist     Locomotion Ambulation   Ambulation assist      Assist level: Minimal Assistance - Patient > 75% Assistive device: Walker-rolling Max distance: 150   Walk 10 feet activity   Assist     Assist level: Minimal Assistance - Patient > 75% Assistive device: Walker-rolling   Walk 50 feet activity   Assist    Assist level: Minimal Assistance - Patient > 75% Assistive device: Walker-rolling    Walk 150 feet activity   Assist Walk 150 feet activity did not occur: Safety/medical concerns  Assist level: Minimal Assistance - Patient > 75%      Walk 10 feet on uneven surface  activity   Assist Walk 10 feet on uneven surfaces activity did not occur: Safety/medical concerns         Wheelchair     Assist Will patient use wheelchair at discharge?: Yes Type of Wheelchair: Manual    Wheelchair assist level: Minimal Assistance - Patient > 75% Max  wheelchair distance: 34'    Wheelchair 50 feet with 2 turns activity    Assist    Wheelchair 50 feet with 2 turns activity did not occur: Safety/medical concerns       Wheelchair 150 feet activity     Assist Wheelchair 150 feet activity did not occur: Safety/medical concerns        Medical Problem List and Plan: 1.  Decline in self care and mobility secondary to meningioma s/p partial resection with IVH  Continue  CIR PT,OT, SLP 2. DVT Prophylaxis/Anticoagulation: Pharmaceutical:Lovenox 3. Pain Management:tylenol prn. 4. Mood:LCSW to follow for evaluation and support. 5. Neuropsych: This patientis notcapable of making decisions on hisown behalf.  Continue enclosure bed for safety- has poor safety awareness and reduce balance 6. Skin/Wound Care:Routine pressure relief measures. 7. Fluids/Electrolytes/Nutrition:Monitor I/O.  Improved intake on 09/13/2018, 955 mL  hypokalemia: 4.5 on 2/26, supplementation decreased on 2/26  hypomagnesia: 1.7 on 2/24, continue supplementation  Labs ordered for Monday  prostat for low albumin  encourage PO 8. Recurrent SVT: Resolved. Continueamiodarone and metoprolol.Continue to monitor HR bid.   9. HTN: ON Lisinopril, Microzide, Metoprolol and Norvasc. Monitor BP bid   Controlled on 2/29 10. Delirium/Hallucinations:   Continue seroquel bid-we will discuss with pharmacy  Ativan DC'd 11. T2DM:   Pt on glucotrol 5mg  bid ac at home.   Glucotrol 2.5mg  bid ac increased to 5 twice daily on 2/24, increased to 7.5 twice daily on 2/27   CBG (last 3)  Recent Labs    09/13/18 1642 09/13/18 2128 09/14/18 0649  GLUCAP 189* 149* 140*  lability noted will cont to monitor 12. OSA/Hypoxia: Needed oxygen with activity 2/20. Resume CPAP    Weaned supplemental oxygen 13.  Acute blood loss anemia  Hemoglobin 11.6 on 2/26  Continue to monitor 14.  Sleep disturbance  ECG reviewed, prolonged QTc.   ECG QTc 513 .  Seroquel increased 75 mg on 09/12/2018, no agitation, still in net bed, may be able to start to wean.   15.  Fall  No reported trauma 16. Prolonged Qt  See above  Repeat ECG ordered, will follow up with Cards regarding Amio if QT remains prolonged  LOS: 10 days A FACE TO FACE EVALUATION WAS PERFORMED  [name] 09/14/2018, 10:38 AM

## 2018-09-14 NOTE — Progress Notes (Signed)
Occupational Therapy Session Note  Patient Details  Name: Jason Farrell. MRN: [id number] Date of Birth: [birth year]  Today's Date: 09/14/2018 OT Individual Time: 4098-1191 OT Individual Time Calculation (min): 59 min   Skilled Therapeutic Interventions/Progress Updates:    Pt greeted in bed with family present. He was agreeable to use the bathroom. Supine<sit with Min-Mod A. Set pt up with eyepatch, however he removed it and kept it off during session. Per pt, this was to make his "eyes stronger." Ambulatory transfer with RW completed with steady assist to toilet. Pt with continent B+B void. Vcs for initiating hygiene completion, which he did in standing with steady assist for balance and vcs for thoroughness. He then completed hand washing and oral care while standing at sink with supervision for balance and vcs for locating soap. Able to find his electric razor behind bottle of saline, and shaved face and back of neck while seated. Vcs required for thoroughness on Rt cheek. Pt interchanged or combined use of eye patch and glasses at this time. He initiated doffing shirt to shake off facial hair, and then donned shirt again without cuing. Pt ambulated once again into bathroom to spray bathroom spray with steady assist for balance and vcs for DME mgt. During all stand<sit transitions, he required vcs for hand placement. 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 Per pt, his double vision is "better than what it was." While sitting at window, pt reported that he saw "birds" and "leaves falling" when neither of these items were outside. Oriented him to month and year. At end of session pt was taken to RN station. Tx focus on functional ambulation, cognitive remediation, visual scanning/perception, and activity tolerance during functional tasks.   Therapy Documentation Precautions:  Precautions Precautions: Fall Restrictions Weight Bearing  Restrictions: No Vital Signs: Therapy Vitals Temp: 97.6 F (36.4 C) Temp Source: Oral Pulse Rate: 84 Resp: 18 BP: 112/64 Patient Position (if appropriate): Sitting Oxygen Therapy SpO2: 97 % O2 Device: Room Air Pain: No s/s pain during tx ADL: ADL Eating: Dependent Grooming: Dependent Upper Body Bathing: Maximal cueing, Maximal assistance Where Assessed-Upper Body Bathing: Wheelchair, Sitting at sink Lower Body Bathing: Dependent Where Assessed-Lower Body Bathing: Sitting at sink, Wheelchair Upper Body Dressing: Maximal assistance, Maximal cueing Where Assessed-Upper Body Dressing: Wheelchair Lower Body Dressing: Dependent, Maximal cueing Where Assessed-Lower Body Dressing: Wheelchair Toileting: Dependent Toilet Transfer: Unable to assess     Therapy/Group: Individual Therapy  Thelda Gagan A Katarzyna Wolven 09/14/2018, 4:22 PM

## 2018-09-15 ENCOUNTER — Inpatient Hospital Stay (HOSPITAL_COMMUNITY): Payer: Managed Care, Other (non HMO) | Admitting: Physical Therapy

## 2018-09-15 ENCOUNTER — Inpatient Hospital Stay (HOSPITAL_COMMUNITY): Payer: Managed Care, Other (non HMO) | Admitting: Occupational Therapy

## 2018-09-15 ENCOUNTER — Inpatient Hospital Stay (HOSPITAL_COMMUNITY): Payer: Managed Care, Other (non HMO)

## 2018-09-15 ENCOUNTER — Inpatient Hospital Stay (HOSPITAL_COMMUNITY): Payer: Managed Care, Other (non HMO) | Admitting: Speech Pathology

## 2018-09-15 LAB — GLUCOSE, CAPILLARY
Glucose-Capillary: 151 mg/dL — ABNORMAL HIGH (ref 70–99)
Glucose-Capillary: 169 mg/dL — ABNORMAL HIGH (ref 70–99)
Glucose-Capillary: 182 mg/dL — ABNORMAL HIGH (ref 70–99)
Glucose-Capillary: 155 mg/dL — ABNORMAL HIGH (ref 70–99)

## 2018-09-15 LAB — BASIC METABOLIC PANEL
Anion gap: 8 (ref 5–15)
BUN: 13 mg/dL (ref 8–23)
CALCIUM: 9.1 mg/dL (ref 8.9–10.3)
CO2: 26 mmol/L (ref 22–32)
Chloride: 102 mmol/L (ref 98–111)
Creatinine, Ser: 0.88 mg/dL (ref 0.61–1.24)
GFR calc Af Amer: >60 mL/min (ref >60)
Glucose, Bld: 137 mg/dL — ABNORMAL HIGH (ref 70–99)
Potassium: 3.9 mmol/L (ref 3.5–5.1)
Sodium: 136 mmol/L (ref 135–145)

## 2018-09-15 LAB — MAGNESIUM: Magnesium: 1.9 mg/dL (ref 1.7–2.4)

## 2018-09-15 MED ORDER — QUETIAPINE FUMARATE 50 MG PO TABS
50.0000 mg | ORAL_TABLET | Freq: Every day | ORAL | Status: DC
  Administered 2018-09-15 – 2018-09-17 (×3): 50 mg via ORAL
  Filled 2018-09-15 (×3): qty 1

## 2018-09-15 MED ORDER — GLIPIZIDE 5 MG PO TABS
10.0000 mg | ORAL_TABLET | Freq: Two times a day (BID) | ORAL | Status: DC
  Administered 2018-09-15 – 2018-09-18 (×6): 10 mg via ORAL
  Filled 2018-09-15 (×8): qty 2

## 2018-09-15 NOTE — Progress Notes (Signed)
Occupational Therapy Session Note  Patient Details  Name: Jason Farrell. MRN: [id number] Date of Birth: Nov 11, 1940  Today's Date: 09/15/2018 OT Individual Time: 4492-0100 OT Individual Time Calculation (min): 39 min    Short Term Goals: Week 2:  OT Short Term Goal 1 (Week 2): STG=LTG 2/2 ELOS  Skilled Therapeutic Interventions/Progress Updates:    Session focused on visual scanning, functional reaching, and functional mobility. Pt with no c/o pain. Pt completed oral care task at sink with set up only, no cueing for locating items. Pt was transported to DynaVision room to address visual scanning and depth perception. Pt had reaction time of L: 2. 36 seconds, R: 2. 04 seconds. Pt was observed to have depth perception deficits initially but was able to self correct. No diplopia reported throughout session. Pt was then transported to therapy gym where he completed functional stepping task to address visual perceptual skills needed to step over a threshold. When reaching outside of BOS pt required as much as mod A to maintain dynamic standing balance. Pt then completed standing level functional reaching with no UE support to challenge dynamic standing balance, with min A throughout. Pt completed 150 ft of functional mobility back to room with RW and CGA. Pt was left in enclosure bed with all needs met.   Therapy Documentation Precautions:  Precautions Precautions: Fall Restrictions Weight Bearing Restrictions: No Pain: Pain Assessment Pain Scale: 0-10 Pain Score: 0-No pain   Therapy/Group: Individual Therapy  Curtis Sites 09/15/2018, 3:12 PM

## 2018-09-15 NOTE — Progress Notes (Signed)
Physical Therapy Session Note  Patient Details  Name: Delante Karapetyan. MRN: [id number] Date of Birth: 1940-10-14  Today's Date: 09/15/2018 PT Individual Time: 7824-2353 PT Individual Time Calculation (min): 35 min   Short Term Goals: Week 2:  PT Short Term Goal 1 (Week 2): = LTG  Skilled Therapeutic Interventions/Progress Updates:    pt performs bed mobility in hospital bed with min A for trunk control.  Bed mobility training on mat and on ADL apartment bed with supervision and increased time.  Pt performs gait throughout unit without AD with CGA, cues due to visual impairments.  [place] exercises performed with 2# wts bilat LE with pt requiring min multimodal cuing to follow.  Gait fwd/bkwd and sideways for balance and strengthening with CGA.  Pt requests bathroom and is able to stand to urinate with supervision. Pt left in enclosure bed with all needs at hand.  Therapy Documentation Precautions:  Precautions Precautions: Fall Restrictions Weight Bearing Restrictions: No Pain: No c/o pain     Therapy/Group: Individual Therapy  Zoha Spranger 09/15/2018, 4:31 PM

## 2018-09-15 NOTE — Progress Notes (Signed)
Speech Language Pathology Daily Session Note  Patient Details  Name: Jason Farrell. MRN: [id number] Date of Birth: 09-02-1940  Today's Date: 09/15/2018 SLP Individual Time: 0930-1020 SLP Individual Time Calculation (min): 50 min  Short Term Goals: Week 2: SLP Short Term Goal 1 (Week 2): Patient will demonstrate selective attention to tasks for ~30 minutes in a minimally distracting enviornment with Min A verbal cues for redirection. SLP Short Term Goal 2 (Week 2): Patient will orient to place, month and situation with Min A verbal and visual cues.  SLP Short Term Goal 3 (Week 2): Patient will demonstrate functional problem solving during functional tasks with Min A verbal cues.  SLP Short Term Goal 4 (Week 2): Patient will recall new, daily information with Min A verbal and visual cues.  SLP Short Term Goal 5 (Week 2): Patient will self-monitor and correct errors during functional tasks with Min A verbal cues.   Skilled Therapeutic Interventions: Skilled treatment session focused on cognitive goals. SLP facilitated session by providing Mod A verbal cues for use of memory compensatory strategies to maximize recall during a novel task.  However, at end of task, patient able to recall 90% of items generated within 4 categories after a 30 minute delay with supervision level verbal cues. Patient demonstrated what appeared to be improved vision which maximized his function with tasks. Patient requested to get back into bed at end of session due to fatigue. Therefore, patient left supine in enclosure bed with all needs within reach. Continue with current plan of care.      Pain No/Denies Pain   Therapy/Group: Individual Therapy  Sunset Joshi 09/15/2018, 12:42 PM

## 2018-09-15 NOTE — Progress Notes (Signed)
[facility] PHYSICAL MEDICINE & REHABILITATION PROGRESS NOTE   Subjective/Complaints: States that he feels well. Denies weakness, pain. Anxious about getting home  ROS: Patient denies fever, rash, sore throat, blurred vision, nausea, vomiting, diarrhea, cough, shortness of breath or chest pain, joint or back pain, headache, or mood change.   Objective:   No results found. No results for input(s): WBC, HGB, HCT, PLT in the last 72 hours. No results for input(s): NA, K, CL, CO2, GLUCOSE, BUN, CREATININE, CALCIUM in the last 72 hours.  Intake/Output Summary (Last 24 hours) at 09/15/2018 0835 Last data filed at 09/15/2018 0514 Gross per 24 hour  Intake 600 ml  Output 525 ml  Net 75 ml     Physical Exam: Vital Signs Blood pressure (!) 139/98, pulse 93, temperature 97.7 F (36.5 C), resp. rate 19, height 5\' 6"  (1.676 m), weight 86 kg, SpO2 97 %. Constitutional: No distress . Vital signs reviewed. HEENT: EOMI, oral membranes moist Neck: supple Cardiovascular: RRR without murmur. No JVD    Respiratory: CTA Bilaterally without wheezes or rales. Normal effort    GI: BS +, non-tender, non-distended  Musc: No edema or tenderness in extremities. Neurologic: Alert and oriented x1 Motor: Grossly >/4/5 throughout, stable Psych: Flat. Oriented to person and hospital  Assessment/Plan: 1. 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  Physiatrist is providing close team supervision and 24 hour management of active medical problems listed below.  Physiatrist and rehab team continue to assess barriers to discharge/monitor patient progress toward functional and medical goals  Care Tool:  Bathing    Body parts bathed by patient: Chest, Abdomen   Body parts bathed by helper: Right arm, Left arm, Front perineal area, Buttocks, Right upper leg, Left upper leg, Right lower leg, Left lower leg,  Face     Bathing assist Assist Level: Total Assistance - Patient < 25%     Upper Body Dressing/Undressing Upper body dressing   What is the patient wearing?: Pull over shirt    Upper body assist Assist Level: Independent    Lower Body Dressing/Undressing Lower body dressing      What is the patient wearing?: Pants, Incontinence brief     Lower body assist Assist for lower body dressing: Total Assistance - Patient < 25%     Toileting Toileting Toileting Activity did not occur Landscape architect and hygiene only): N/A (no void or bm)  Toileting assist Assist for toileting: Contact Guard/Touching assist     Transfers Chair/bed transfer  Transfers assist     Chair/bed transfer assist level: Contact Guard/Touching assist     Locomotion Ambulation   Ambulation assist      Assist level: Minimal Assistance - Patient > 75% Assistive device: Walker-rolling Max distance: 150   Walk 10 feet activity   Assist     Assist level: Minimal Assistance - Patient > 75% Assistive device: Walker-rolling   Walk 50 feet activity   Assist    Assist level: Minimal Assistance - Patient > 75% Assistive device: Walker-rolling    Walk 150 feet activity   Assist Walk 150 feet activity did not occur: Safety/medical concerns  Assist level: Minimal Assistance - Patient > 75%      Walk 10 feet on uneven surface  activity   Assist Walk 10 feet on uneven surfaces activity did not occur: Safety/medical concerns         Wheelchair     Assist  Will patient use wheelchair at discharge?: Yes Type of Wheelchair: Manual    Wheelchair assist level: Minimal Assistance - Patient > 75% Max wheelchair distance: 61'    Wheelchair 50 feet with 2 turns activity    Assist    Wheelchair 50 feet with 2 turns activity did not occur: Safety/medical concerns       Wheelchair 150 feet activity     Assist Wheelchair 150 feet activity did not occur:  Safety/medical concerns        Medical Problem List and Plan: 1.  Decline in self care and mobility secondary to meningioma s/p partial resection with IVH  Continue CIR PT,OT, SLP 2. DVT Prophylaxis/Anticoagulation: Pharmaceutical:Lovenox 3. Pain Management:tylenol prn. 4. Mood:LCSW to follow for evaluation and support. 5. Neuropsych: This patientis notcapable of making decisions on hisown behalf.  Continue enclosure bed for safety- limited safety awareness and reduced balance 6. Skin/Wound Care:Routine pressure relief measures. 7. Fluids/Electrolytes/Nutrition:Monitor I/O.  Improved intake on 09/13/2018, 955 mL  hypokalemia: 4.5 on 2/26, supplementation decreased on 2/26  hypomagnesia: 1.7 on 2/24, continue supplementation  Labs ordered for Monday  prostat for low albumin  encourage PO 8. Recurrent SVT: Resolved. Continueamiodarone and metoprolol.Continue to monitor HR bid.   9. HTN: ON Lisinopril, Microzide, Metoprolol and Norvasc. Monitor BP bid   Controlled on 3/2 10. Delirium/Hallucinations:   Continue seroquel bid prn, scheduled at HS, may be able to start weaning soon  Ativan DC'd 11. T2DM:   Pt on glucotrol 5mg  bid ac at home.   Glucotrol 2.5mg  bid ac increased to 5 twice daily on 2/24, increased to 7.5 twice daily on 2/27---increase to 10mg  bid today   CBG (last 3)  Recent Labs    09/14/18 1659 09/14/18 2106 09/15/18 0611  GLUCAP 136* 226* 151*    12. OSA/Hypoxia: Needed oxygen with activity 2/20. Resume CPAP    Weaned supplemental oxygen 13.  Acute blood loss anemia  Hemoglobin 11.6 on 2/26  Continue to monitor 14.  Sleep disturbance  ECG reviewed, prolonged QTc.   ECG QTc 513 .  Wean seroquel 75 soon 15.  Fall  No reported trauma 16. Prolonged Qt  See above   follow up with Cards regarding Amio if QT remains prolonged  LOS: 11 days A FACE TO FACE EVALUATION WAS PERFORMED  Meredith Staggers 09/15/2018, 8:35 AM

## 2018-09-15 NOTE — Progress Notes (Signed)
Occupational Therapy Session Note  Patient Details  Name: Jason Farrell. MRN: [id number] Date of Birth: 10-03-1940  Today's Date: 09/15/2018 OT Individual Time: 6219-4712 OT Individual Time Calculation (min): 70 min   Skilled Therapeutic Interventions/Progress Updates:    Pt greeted semi-reclined in enclosure bed, awake, and agreeable to OT treatment session. Pt reports need to go to the bathroom. Pt came to sitting EOB with mod A 2/2 enclosure bed side panels. Pt ambulated to bathroom with RW, CGA, and verbal cues to locate bathroom door on R side. Pt voided bladder. Bathing completed shower level with OT issuing a LH sponge to increase independence with LB bathing 2/2 back pain with bending. Pt demonstrated understanding and was able to wash LEs and bottom with LH sponge and CGA for standing balance. Worked on visual scanning strategies to locate grooming items on the sink with min verbal cues. Pt able to apply toothpaste on toothbrush today at midline without undershooting and without patch on. Worked on standing balance/endurance within shaving task with pt able to locate electric razor on R side of the sink without cues. Pt able to thread BLEs into pants today and pull them up with supervision. Worked on donning socks using sock aid with pt able to recall technique with min verbal cues for sock orientation on sock-aid. Min A to don shoes using shoe horn. Pt set-up with breakfast with improved ability to locate food items on tray. Pt left in care of nurse tech to provide supervision for meal. Safety belt on and needs met.   Therapy Documentation Precautions:  Precautions Precautions: Fall Restrictions Weight Bearing Restrictions: No Pain:  none/denies pain  Therapy/Group: Individual Therapy  Valma Cava 09/15/2018, 7:54 AM

## 2018-09-16 ENCOUNTER — Inpatient Hospital Stay (HOSPITAL_COMMUNITY): Payer: Managed Care, Other (non HMO) | Admitting: Speech Pathology

## 2018-09-16 ENCOUNTER — Inpatient Hospital Stay (HOSPITAL_COMMUNITY): Payer: Managed Care, Other (non HMO) | Admitting: Occupational Therapy

## 2018-09-16 ENCOUNTER — Inpatient Hospital Stay (HOSPITAL_COMMUNITY): Payer: Managed Care, Other (non HMO) | Admitting: Physical Therapy

## 2018-09-16 ENCOUNTER — Encounter (HOSPITAL_COMMUNITY): Payer: Managed Care, Other (non HMO) | Admitting: Psychology

## 2018-09-16 DIAGNOSIS — F09 Unspecified mental disorder due to known physiological condition: Secondary | ICD-10-CM

## 2018-09-16 LAB — GLUCOSE, CAPILLARY
GLUCOSE-CAPILLARY: 138 mg/dL — AB (ref 70–99)
Glucose-Capillary: 147 mg/dL — ABNORMAL HIGH (ref 70–99)
Glucose-Capillary: 99 mg/dL (ref 70–99)
Glucose-Capillary: 161 mg/dL — ABNORMAL HIGH (ref 70–99)

## 2018-09-16 NOTE — Progress Notes (Signed)
Speech Language Pathology Daily Session Note  Patient Details  Name: Jason Farrell. MRN: [id number] Date of Birth: 02-11-1941  Today's Date: 09/16/2018 SLP Individual Time: 0730-0815 SLP Individual Time Calculation (min): 45 min  Short Term Goals: Week 2: SLP Short Term Goal 1 (Week 2): Patient will demonstrate selective attention to tasks for ~30 minutes in a minimally distracting enviornment with Min A verbal cues for redirection. SLP Short Term Goal 2 (Week 2): Patient will orient to place, month and situation with Min A verbal and visual cues.  SLP Short Term Goal 3 (Week 2): Patient will demonstrate functional problem solving during functional tasks with Min A verbal cues.  SLP Short Term Goal 4 (Week 2): Patient will recall new, daily information with Min A verbal and visual cues.  SLP Short Term Goal 5 (Week 2): Patient will self-monitor and correct errors during functional tasks with Min A verbal cues.   Skilled Therapeutic Interventions: Skilled treatment session focused on cognitive goals. SLP facilitated session by providing extra time and supervision verbal cues for functional problem solving during basic self-care tasks such as grooming and self-feeding. Patient disoriented to time and place and required Min A verbal cues for recall. Patient left upright in wheelchair with alarm on and all needs within reach. Continue with current plan of care.      Pain No/Denies Pain   Therapy/Group: Individual Therapy  Christiaan Strebeck 09/16/2018, 3:56 PM

## 2018-09-16 NOTE — Progress Notes (Signed)
Physical Therapy Session Note  Patient Details  Name: Verlin Uher. MRN: [id number] Date of Birth: Sep 21, 1940  Today's Date: 09/16/2018 PT Individual Time: 0930-1025 PT Individual Time Calculation (min): 55 min   Short Term Goals: Week 2:  PT Short Term Goal 1 (Week 2): = LTG  Skilled Therapeutic Interventions/Progress Updates:    session focused on pt/family education with pt's wife and daughter.  Pt performs gait with RW and without AD, stair negotiation with 1 handrail, car transfers all with wife providing CGA due to visual deficits.  Pt performs floor transfer with supervision with cues for safety.  Pt/family educated on energy conservation, safety ideas for home, PT recommendations for discharge with all verbalizing understanding.  Pt/family state they feel comfortable with pt to d/c home at this level of care.  Pt performs nustep x 8 minutes for UE/LE strength and coordination level 5 at 90-100 steps per minute. Pt very excited to d/c home this week.  Therapy Documentation Precautions:  Precautions Precautions: Fall Restrictions Weight Bearing Restrictions: No Pain: Pain Assessment Pain Scale: 0-10 Pain Score: 0-No pain   Therapy/Group: Individual Therapy  Mysha Peeler 09/16/2018, 10:27 AM

## 2018-09-16 NOTE — Progress Notes (Signed)
Occupational Therapy Discharge Summary  Patient Details  Name: Jason Farrell. MRN: [id number] Date of Birth: [birth year]   Patient has met 60 of 64 long term goals due to improved activity tolerance, improved balance, postural control, ability to compensate for deficits, improved attention, improved awareness and improved coordination.  Patient to discharge at overall Supervision level.  Patient's care partner is independent to provide the necessary physical and cognitive assistance at discharge.  Pt's family have attended hands on family education session. They voice and demonstrate willing and ableness to provide needed assist at d/c.   Recommendation:  Patient will benefit from ongoing skilled OT services in outpatient setting to continue to advance functional skills in the area of BADL.  Equipment: No equipment provided pt has all needed equipment- family ordering tub transfer bench privately   Reasons for discharge: treatment goals met and discharge from hospital  Patient/family agrees with progress made and goals achieved: Yes  OT Discharge Precautions/Restrictions  Precautions Precautions: Fall Precaution Comments: low vision, cognitive deficits Restrictions Weight Bearing Restrictions: No Vision Baseline Vision/History: Wears glasses Wears Glasses: At all times Patient Visual Report: Blurring of vision;Diplopia(Reports "improving" double vision) Vision Assessment?: Vision impaired- to be further tested in functional context Alignment/Gaze Preference: Chin down Additional Comments: Cont to have blurring of vision and reports "some" diplopia, unable to elaborate 2/2 cognitive deficits. Observe peripheral vision loss as pt requires cuing and min A to avoid environemtal obstacles when ambulating Perception  Perception: Impaired Inattention/Neglect: Does not attend to left visual field Comments: Requires VCs for awareness of environmental obstacles when  ambulating Praxis Praxis: Intact Cognition Overall Cognitive Status: Impaired/Different from baseline Arousal/Alertness: Awake/alert Orientation Level: Oriented X4 Attention: Selective Focused Attention: Appears intact Focused Attention Impairment: Verbal basic;Functional basic Selective Attention: Impaired Selective Attention Impairment: Verbal basic;Functional basic Memory: Impaired Memory Impairment: Decreased recall of new information;Decreased short term memory Decreased Short Term Memory: Functional basic;Verbal basic Awareness: Impaired Awareness Impairment: Emergent impairment Problem Solving: Impaired Problem Solving Impairment: Functional basic Executive Function: (All impaired 2/2 ower level cognitive deficits) Behaviors: Impulsive Safety/Judgment: Impaired Comments: Decreased safety awareness. Decreased awareness of deficits and functional implications Sensation Sensation Light Touch: Appears Intact Stereognosis: Appears Intact Coordination Gross Motor Movements are Fluid and Coordinated: No Fine Motor Movements are Fluid and Coordinated: Yes Coordination and Movement Description: Generalized weakness Motor  Motor Motor: Within Functional Limits Motor - Discharge Observations: generalized deconditioning Trunk/Postural Assessment  Cervical Assessment Cervical Assessment: Exceptions to WFL(Forward head) Thoracic Assessment Thoracic Assessment: Exceptions to WFL(Kyphotic) Lumbar Assessment Lumbar Assessment: Exceptions to WFL(Posterior pelvic tilt) Postural Control Postural Control: Deficits on evaluation Righting Reactions: impaired/delayed Protective Responses: impaired/delayed  Balance Balance Balance Assessed: Yes Static Sitting Balance Static Sitting - Balance Support: Feet supported Static Sitting - Level of Assistance: 7: Independent Dynamic Sitting Balance Dynamic Sitting - Balance Support: Feet supported;During functional activity Dynamic  Sitting - Level of Assistance: 5: Stand by assistance Sitting balance - Comments: Sitting to complete bathing/dressing tasks Static Standing Balance Static Standing - Balance Support: During functional activity;No upper extremity supported Static Standing - Level of Assistance: 5: Stand by assistance Dynamic Standing Balance Dynamic Standing - Balance Support: During functional activity;No upper extremity supported Dynamic Standing - Level of Assistance: 5: Stand by assistance Dynamic Standing - Comments: Standing to complete LB clothing management and toileting task Extremity/Trunk Assessment RUE Assessment RUE Assessment: Within Functional Limits General Strength Comments: 4/5 throughout, able to use at functional level independently LUE Assessment LUE Assessment: Exceptions to [facility] General Strength Comments: 4/5  throughout, able to use at functional level independently   Malerie Eakins L 09/17/2018, 5:43 PM

## 2018-09-16 NOTE — Consult Note (Signed)
Neuropsychological Consultation   Patient:   Traveion Ruddock.   DOB:   19-May-1941  MR Number:  [id number]  Location:  [facility] [place] [id number] [place] [address] Dept: [place]: [phone]           Date of Service:   09/16/2018  Start Time:   1 PM End Time:   2 PM  Provider/Observer:  Ilean Skill, Psy.D.       Clinical Neuropsychologist       Billing Code/Service: 28786  Chief Complaint:    Dola Factor. is a 78 year old male with a history of CAD, hypertension, obstructive sleep apnea with CPAP, history of intermittent confusion, difficulty speaking and visual changes since 06/2018.  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  Postop course was significant for degrees and level of consciousness due to IVH and focal hemorrhage and clot in right ventricle.  The patient has had times of disturbed sleep including vivid dreams that are primarily negative in nature.  The patient had significant cognitive difficulties and delirium/confusion which has progressively improved over time.  Reason for Service:  The patient was referred for neuropsychological consultation due to coping and adjustment issues as well as looking at cognitive functioning and patient's ability to remember and benefit from ongoing therapeutic interventions.  Below is the HPI for the current admission.  VEH:[id number] Tawni Levy. 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 MRI at [facility]. Postop course significant for decrease in level of consciousness due to IVH with focal hemorrhage and clot in right ventricle. CTA head/neck done revealing narrowed/diminished flow in right V4 segment question due to dissection, thrombus or vasospasm. Neurology consulted for input and felt that delirium was responsible for patient's fluctuating mental status with confusion and agitation. He is also had loss of bilateral vision and ophthalmology felt that visual loss was cortical in nature likely due to PR ES over PROM. He developed increasing lethargy with leukocytosis on 02/07 and work-up was negative for infection. He did have episode of SVT on 2/11 and beta-blocker with resolution. He continues to require Seroquel to help with sleep-wake disruption as well as Zyprexa to manage agitation.  He was admitted to CIR on 08/28/2018 for intensive rehab to consist of PT,OT and ST at least 3 hours 5 days a week. Sleep hygiene was improving and he was tolerating the therapy. He did have recurrent SVT on 2/17 and was started on IV amiodarone and BB titrated upwards. 2D echo done revealing EF 55% with moderate increase in LV wall and trivial AVR. Follow up CT head done due to ongoing issues with confusion and agitation. 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 EEG done for work up and was normal study without epileptiform activity. Heart rate currently controlled.recommended decreasing  amiodarone to daily and follow-up with Dr. Rockey Situ on outpatient basis.Therapy reevaluation completed revealing patient with apraxia, cognitive deficits as well as balance deficits affecting ADLs as well as mobility. He was cleared to resume CIR program  Current Status:  The patient was able to maintain appropriate conversation but continued to show memory issues for just before and since 08/11/2018.  The patient reports that he does remember family members coming to see recently.  He was able to recall events today but was still with some lack of details of today or yesterday.  He was recalling major events today.  Patient was not able to describe what has happened with him medically and struggled with date and time but was able to get close (2021).  Patient is remembering enough to benefit and recall efforts in rehab.  Patient reports that nightmare have reduced recently.    Behavioral Observation: Sutter Ahlgren.  presents as a 78 y.o.-year-old Right Caucasian Male who appeared his stated age. his dress was Appropriate and he was Well Groomed and his manners were Appropriate to the situation.  his participation was indicative of Appropriate and Redirectable behaviors.  There were any physical disabilities noted.  he displayed an appropriate level of cooperation and motivation.     Interactions:    Active Appropriate and Redirectable  Attention:   abnormal and attention span appeared shorter than expected for age  Memory:   abnormal; global memory impairment noted  Visuo-spatial:  not examined  Speech (Volume):  low  Speech:   normal; normal  Thought Process:  Coherent and Relevant  Though Content:  WNL; not suicidal and not homicidal  Orientation:   person and place  Judgment:   Poor  Planning:   Poor  Affect:    Lethargic  Mood:    Dysphoric  Insight:   Shallow  Intelligence:   high  Medical History:   Past Medical History:  Diagnosis Date  . Coronary artery  disease   . Diabetes mellitus without complication ([place])   . Dyslipidemia   . GERD (gastroesophageal reflux disease)   . Hyperlipidemia   . Hypertension   . MI (myocardial infarction) ([place])   . Osteoarthritis    Family Med/Psych History:  Family History  Problem Relation Age of Onset  . Hyperlipidemia Father   . Hypertension Father   . Alcohol abuse Father   . Cancer Sister        lung  . Hypertension Sister   . Heart disease Sister   . Hyperlipidemia Sister   . COPD Sister     Risk of Suicide/Violence: low Patient denies SI or HI.  Impression/DX:  Dola Factor. is a 78 year old male with a history of CAD, hypertension, obstructive sleep apnea with CPAP, history of intermittent confusion, difficulty speaking and visual changes since 06/2018.  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  Postop course was significant for degrees and level of consciousness due to IVH and focal hemorrhage and clot in right ventricle.  The patient has had times of disturbed sleep including vivid dreams that are primarily negative in nature.  The patient had significant cognitive difficulties and delirium/confusion which has progressively improved over time.  The patient was able to maintain appropriate conversation but continued to show memory issues for just before and since 08/11/2018.  The patient reports that he does remember family members coming to see recently.  He was able to recall events today but was still with some lack of details of today or yesterday.  He was recalling major events today.  Patient was not able to describe what has happened with him medically and struggled with date and time but was able to get close (2021).  Patient is remembering enough to benefit and  recall efforts in rehab.  Patient reports that nightmare have reduced recently.   Cognitive moderately impaired with ongoing improvement especially with orientation.  Retro and anterograde amnesia improving.          Electronically Signed   _______________________ Ilean Skill, Psy.D.

## 2018-09-16 NOTE — Progress Notes (Signed)
[place] PHYSICAL MEDICINE & REHABILITATION PROGRESS NOTE   Subjective/Complaints: Pt in up in w/c going to breakfast with SLP. No new complaints. Slept overnight  ROS: Patient denies fever, rash, sore throat, blurred vision, nausea, vomiting, diarrhea, cough, shortness of breath or chest pain, joint or back pain, headache, or mood change.   Objective:   No results found. No results for input(s): WBC, HGB, HCT, PLT in the last 72 hours. Recent Labs    09/15/18 0834  NA 136  K 3.9  CL 102  CO2 26  GLUCOSE 137*  BUN 13  CREATININE 0.88  CALCIUM 9.1    Intake/Output Summary (Last 24 hours) at 09/16/2018 0813 Last data filed at 09/16/2018 0450 Gross per 24 hour  Intake 840 ml  Output 1150 ml  Net -310 ml     Physical Exam: Vital Signs Blood pressure 133/73, pulse 90, temperature 98.7 F (37.1 C), temperature source Oral, resp. rate 19, height 5\' 6"  (1.676 m), weight 86 kg, SpO2 97 %. Constitutional: No distress . Vital signs reviewed. HEENT: EOMI, oral membranes moist Neck: supple Cardiovascular: RRR without murmur. No JVD    Respiratory: CTA Bilaterally without wheezes or rales. Normal effort    GI: BS +, non-tender, non-distended   Musc: No edema or tenderness in extremities. Neurologic: Alert and oriented to person, hospital, recalled dc date was Friday  Motor: Grossly >/4/5 throughout, stable Psych: fairly appropriate.    Assessment/Plan: 1. 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  Physiatrist is providing close team supervision and 24 hour management of active medical problems listed below.  Physiatrist and rehab team continue to assess barriers to discharge/monitor patient progress toward functional and medical goals  Care Tool:  Bathing    Body parts bathed by patient: Chest, Abdomen   Body parts bathed by helper: Right arm, Left arm, Front  perineal area, Buttocks, Right upper leg, Left upper leg, Right lower leg, Left lower leg, Face     Bathing assist Assist Level: Total Assistance - Patient < 25%     Upper Body Dressing/Undressing Upper body dressing   What is the patient wearing?: Pull over shirt    Upper body assist Assist Level: Independent    Lower Body Dressing/Undressing Lower body dressing      What is the patient wearing?: Pants, Incontinence brief     Lower body assist Assist for lower body dressing: Total Assistance - Patient < 25%     Toileting Toileting Toileting Activity did not occur Landscape architect and hygiene only): N/A (no void or bm)  Toileting assist Assist for toileting: Contact Guard/Touching assist     Transfers Chair/bed transfer  Transfers assist     Chair/bed transfer assist level: Contact Guard/Touching assist     Locomotion Ambulation   Ambulation assist      Assist level: Minimal Assistance - Patient > 75% Assistive device: Walker-rolling Max distance: 150   Walk 10 feet activity   Assist     Assist level: Minimal Assistance - Patient > 75% Assistive device: Walker-rolling   Walk 50 feet activity   Assist    Assist level: Minimal Assistance - Patient > 75% Assistive device: Walker-rolling    Walk 150 feet activity   Assist Walk 150 feet activity did not occur: Safety/medical concerns  Assist level: Minimal Assistance - Patient > 75%      Walk 10 feet on uneven surface  activity  Assist Walk 10 feet on uneven surfaces activity did not occur: Safety/medical concerns         Wheelchair     Assist Will patient use wheelchair at discharge?: Yes Type of Wheelchair: Manual    Wheelchair assist level: Minimal Assistance - Patient > 75% Max wheelchair distance: 52'    Wheelchair 50 feet with 2 turns activity    Assist    Wheelchair 50 feet with 2 turns activity did not occur: Safety/medical concerns        Wheelchair 150 feet activity     Assist Wheelchair 150 feet activity did not occur: Safety/medical concerns        Medical Problem List and Plan: 1.  Decline in self care and mobility secondary to meningioma s/p partial resection with IVH  Continue CIR PT,OT, SLP 2. DVT Prophylaxis/Anticoagulation: Pharmaceutical:Lovenox 3. Pain Management:tylenol prn. 4. Mood:LCSW to follow for evaluation and support. 5. Neuropsych: This patientis notcapable of making decisions on hisown behalf.  Continue enclosure bed for safety---consider transition to low bed soon 6. Skin/Wound Care:Routine pressure relief measures. 7. Fluids/Electrolytes/Nutrition:Monitor I/O.  Improved intake on 09/13/2018, 955 mL  hypokalemia: 4.5 on 2/26, supplementation decreased on 2/26  hypomagnesia: 1.7 on 2/24, continue supplementation  Labs ordered for Monday  prostat for low albumin  encourage PO 8. Recurrent SVT: Resolved. Continueamiodarone and metoprolol.Continue to monitor HR bid.   9. HTN: ON Lisinopril, Microzide, Metoprolol and Norvasc. Monitor BP bid   Controlled on 3/3 10. Delirium/Hallucinations:   Continue seroquel bid prn, scheduled at HS, decreased to 50mg  last night without any problems  Ativan DC'd 11. T2DM:   Pt on glucotrol 5mg  bid ac at home.   Glucotrol 2.5mg  bid ac increased to 5 twice daily on 2/24, increased to 7.5 twice daily on 2/27---increase to 10mg  bid today   CBG (last 3)  Recent Labs    09/15/18 1632 09/15/18 2116 09/16/18 0643  GLUCAP 182* 155* 147*    12. OSA/Hypoxia: Needed oxygen with activity 2/20. Resume CPAP    Weaned supplemental oxygen 13.  Acute blood loss anemia  Hemoglobin 11.6 on 2/26  Continue to monitor 14.  Sleep disturbance  ECG reviewed, prolonged QTc.   ECG QTc 513 .  Wean seroquel as above 15.  Fall  No reported trauma 16. Prolonged Qt  See above   follow up with Cards regarding [place] if QT remains prolonged  LOS: 12 days A FACE TO  FACE EVALUATION WAS PERFORMED  Meredith Staggers 09/16/2018, 8:13 AM

## 2018-09-16 NOTE — Progress Notes (Addendum)
Occupational Therapy Session Note  Patient Details  Name: Jason Farrell. MRN: [id number] Date of Birth: Jan 26, 1941  Today's Date: 09/16/2018  Session 1 OT Individual Time: 3212-2482 OT Individual Time Calculation (min): 55 min   Session 2 OT Individual Time: 1245-1300 OT Individual Time Calculation (min): 15 min    Short Term Goals: Week 2:  OT Short Term Goal 1 (Week 2): STG=LTG 2/2 ELOS  Skilled Therapeutic Interventions/Progress Updates:    Session 1 Pt greeted seated in wc and agreeable to OT treatment session. Pt ambulated into bathroom with RW and CGA. Verbal cues to visually scan to the R to locate doorway to bathroom. Pt removed clothing sitting on tub bench. Bathing completed with overall supervision/set-up A using LH sponge to wash LB and buttocks. Pt needed min verbal cues for sock orientation when donning socks using sock-aid. Supervision to thread pant legs and CGA for balance when standing to pull pants up 2/2 slight posterior balance. Without eye patch on, pt able to state how many fingers I was holding up accurately-improved double vision. Pt's spouse and daughter present so OT took opportunity for family education. Practiced ambulating in and out of bathroom w/ RW and family. Pt's family able to give appropriate cues for safety and vision compensation. Practiced tub bench transfer in tub room with pt and family-they feel comfortable with bathroom transfers. Pt's daughter to order tub transfer bench on [place] to have it delivered to their house. They would like to have everything ready for him upon d/c. Pt left seated in wc at end of session with safety belt on and family present.   Session 2 Pt greeted sitting in wc and agreeable to OT treatment session. Pt reports improved overall vision and was watching TV. Fine motor coordination, hand writing, and visual scanning with drawing activity. Practiced writing name on the line with pt able to do without difficulty on L side  of page. Had pt copy horizontal line, then wavy line to work on smoothness, accuracy, and visual scanning across a page. Pt's lunch arrived and continued working on visual scanning to locate food items with increased accuracy. Pt handoff to Jenny Reichmann with Neuropsychology.   Therapy Documentation Precautions:  Precautions Precautions: Fall Restrictions Weight Bearing Restrictions: No Pain:  none/denies pain   Therapy/Group: Individual Therapy  Valma Cava 09/16/2018, 3:02 PM

## 2018-09-17 ENCOUNTER — Ambulatory Visit: Payer: Managed Care, Other (non HMO) | Admitting: Nurse Practitioner

## 2018-09-17 ENCOUNTER — Inpatient Hospital Stay (HOSPITAL_COMMUNITY): Payer: Managed Care, Other (non HMO) | Admitting: Physical Therapy

## 2018-09-17 ENCOUNTER — Inpatient Hospital Stay (HOSPITAL_COMMUNITY): Payer: Managed Care, Other (non HMO) | Admitting: Occupational Therapy

## 2018-09-17 ENCOUNTER — Inpatient Hospital Stay (HOSPITAL_COMMUNITY): Payer: Managed Care, Other (non HMO) | Admitting: Speech Pathology

## 2018-09-17 LAB — BASIC METABOLIC PANEL
Anion gap: 11 (ref 5–15)
BUN: 11 mg/dL (ref 8–23)
CO2: 22 mmol/L (ref 22–32)
Calcium: 9 mg/dL (ref 8.9–10.3)
Chloride: 105 mmol/L (ref 98–111)
Creatinine, Ser: 0.8 mg/dL (ref 0.61–1.24)
GFR calc Af Amer: >60 mL/min (ref >60)
GFR calc non Af Amer: >60 mL/min (ref >60)
Glucose, Bld: 170 mg/dL — ABNORMAL HIGH (ref 70–99)
Potassium: 3.7 mmol/L (ref 3.5–5.1)
Sodium: 138 mmol/L (ref 135–145)

## 2018-09-17 LAB — CBC
HCT: 38 % — ABNORMAL LOW (ref 39.0–52.0)
Hemoglobin: 12 g/dL — ABNORMAL LOW (ref 13.0–17.0)
MCH: 28.6 pg (ref 26.0–34.0)
MCHC: 31.6 g/dL (ref 30.0–36.0)
MCV: 90.7 fL (ref 80.0–100.0)
Platelets: 545 10*3/uL — ABNORMAL HIGH (ref 150–400)
RBC: 4.19 MIL/uL — ABNORMAL LOW (ref 4.22–5.81)
RDW: 15.6 % — ABNORMAL HIGH (ref 11.5–15.5)
WBC: 11.8 10*3/uL — ABNORMAL HIGH (ref 4.0–10.5)
nRBC: 0 % (ref 0.0–0.2)

## 2018-09-17 LAB — GLUCOSE, CAPILLARY
GLUCOSE-CAPILLARY: 159 mg/dL — AB (ref 70–99)
Glucose-Capillary: 143 mg/dL — ABNORMAL HIGH (ref 70–99)
Glucose-Capillary: 158 mg/dL — ABNORMAL HIGH (ref 70–99)
Glucose-Capillary: 170 mg/dL — ABNORMAL HIGH (ref 70–99)

## 2018-09-17 NOTE — Progress Notes (Signed)
Nutrition Follow-up  DOCUMENTATION CODES:   Obesity unspecified  INTERVENTION:  - Continue Ensure Max po BID, each supplement provides 150 kcal and 30 grams of protein.  - Continue Magic cup TID with meals, each supplement provides 290 kcal and 9 grams of protein - Continue MVI daily  NUTRITION DIAGNOSIS:   Increased nutrient needs related to wound healing as evidenced by estimated needs.  Ongoing  GOAL:   Patient will meet greater than or equal to 90% of their needs  Meeting with supplements and PO intake  MONITOR:   PO intake, Weight trends, Supplement acceptance, I & O's  REASON FOR ASSESSMENT:   Malnutrition Screening Tool    ASSESSMENT:   78 year old male with PMH of CAD, HTN, OSA-CPAP, T2DM, GERD, MI, AMS, difficulty speaking s/p recent craniotomy w/ EVD placement for meningioma.  Spoke with pt and wife who were both in good spirits.   Meal completion is now at 100%. Pt and wife both agree that his appetite is much better and closer to baseline. Pt is tolerating both the Ensure Max and Magic Cup very well; reports no n/v, diarrhea/constipation.   Pt was in his wheelchair so was unable to obtain new wt. Pt reported that he believes he will be going home tomorrow; expected discharge date is set for tomorrow.   Pt wife already has supplements at home; pt wants to continue to drink them twice a day. Encouraged pt to continue to drink those and to continue to have good PO intake to assist with overall healing.   Medications reviewed and include: Colace, Glucotrol 10mg  2x, insulin aspart 0-15 units, insulin aspart 0-5 units, Mag-ox 400mg  2x, MVI, Protonix, KCl daily, Ensure Max BID Labs reviewed: CBG (563,[id number])  Diet Order:   Diet Order            Diet heart healthy/carb modified Room service appropriate? Yes; Fluid consistency: Thin  Diet effective now              EDUCATION NEEDS:   Not appropriate for education at this time  Skin:  Skin  Assessment: Skin Integrity Issues:(incision (closed) head) Skin Integrity Issues:: Stage II Stage II: sacrum Other: MASD groin  Last BM:  3/3  Height:   Ht Readings from Last 1 Encounters:  09/04/18 5\' 6"  (1.676 m)    Weight:   Wt Readings from Last 1 Encounters:  09/04/18 86 kg    Ideal Body Weight:  64.5 kg  BMI:  Body mass index is 30.6 kg/m.  Estimated Nutritional Needs:   Kcal:  1930-2160  Protein:  111-120 grams  Fluid:  1.9 L/day    [employer] Dietetic Intern

## 2018-09-17 NOTE — Progress Notes (Signed)
Social Work Patient ID: Jason Farrell., male   DOB: 01/21/1941, 78 y.o.   MRN: [id number]   Met with pt, wife and daughter following team conference and all pleased with progress and "thrilled!" with earlier d/c date of 3/5.  Agreeable with recommendation for OP f/u and I have arranged for this at [facility].    Suzanne Garbers, LCSW

## 2018-09-17 NOTE — Progress Notes (Signed)
Placed patient on home CPAP.

## 2018-09-17 NOTE — Patient Care Conference (Signed)
Inpatient RehabilitationTeam Conference and Plan of Care Update Date: 09/16/2018   Time: 2:40 PM    Patient Name: Jason Farrell.      Medical Record Number: [id number]  Date of Birth: 05-20-1941 Sex: Male         Room/Bed: 4W04C/4W04C-01 Payor Info: Payor: MEDICARE / Plan: MEDICARE PART A AND B / Product Type: *No Product type* /    Admitting Diagnosis: seizures  Admit Date/Time:  09/04/2018  4:13 PM Admission Comments: No comment available   Primary Diagnosis:  <principal problem not specified> Principal Problem: <principal problem not specified>  Patient Active Problem List   Diagnosis Date Noted  . Sleep disturbance   . Fall   . Prolonged QT interval   . Acute delirium   . Labile blood glucose   . OSA (obstructive sleep apnea)   . Diabetes mellitus type 2 in nonobese (HCC)   . Labile blood pressure   . Hypomagnesemia   . Pressure injury of skin 09/02/2018  . PRES (posterior reversible encephalopathy syndrome) 08/30/2018  . Acute blood loss anemia   . Hypoalbuminemia due to protein-calorie malnutrition ([place])   . Hypokalemia   . Leukocytosis   . Encephalopathy   . SVT (supraventricular tachycardia) ([place])   . 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 Debility   . Meningioma ([place])   . 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 Senile purpura ([place]) 02/21/2018  . Type 2 diabetes mellitus with microalbuminuria, without long-term current use of insulin ([place]) 02/21/2018  . Controlled type 2 diabetes mellitus without complication, without long-term current use of insulin ([place]) 11/11/2017  . 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 Obstructive sleep apnea on CPAP 12/09/2015  . Acid reflux 05/06/2015  . Arthritis, degenerative 05/06/2015  . CAD in native artery 01/25/2015  . Diabetes mellitus type 2 in obese ([place]) 08/16/2014  . Hyperlipidemia 08/16/2014  . Essential hypertension 08/16/2014  . Morbid obesity ([place]) 08/16/2014  . Stopped smoking with greater  than 30 pack year history 08/16/2014  . COLD (chronic obstructive lung disease) ([place]) 08/16/2014    Expected Discharge Date: Expected Discharge Date: 09/18/18  Team Members Present: Physician leading conference: Dr. Alger Simons Social Worker Present: Lennart Pall, LCSW Nurse Present: Other (comment)(Nate Gretta Cool, RN) PT Present: Roderic Ovens, PT OT Present: Cherylynn Ridges, OT SLP Present: Weston Anna, SLP PPS Coordinator present : Gunnar Fusi     Current Status/Progress Goal Weekly Team Focus  Medical   pt making gains in cognition and mobility, HR under control  improve safety awareness  see above, remove enclosure bed   Bowel/Bladder   LBM 3/1  Continue to be continent of bowel and bladder with fewer episodes of incontinence. timed toileting  assess toileting needs qshift and PRN.   Swallow/Nutrition/ Hydration             ADL's   Min A overall  Supervision  modified bathing/dressing, safety awareness, vision, pt/family education, dc planning   Mobility   supervision/min A gait and transfers  supervision gait, min A stairs  activity tolerance, balance, pt/family education   Communication             Safety/Cognition/ Behavioral Observations  Min-Mod A   Min-Mod A   Family Education    Pain   denies pain  Remain free of pain, pain <3/10  assess pain qshift and PRN   Skin   Bruising to abdomen MASD to groin, stage 2 to bottom barrier cream foam dressing and microguard powder.  promote proper wound healing to impaired areas, no new skin issues  assess skin qshift and PRN    Rehab Goals Patient on target to meet rehab goals: Yes *See Care Plan and progress notes for long and short-term goals.     Barriers to Discharge  Current Status/Progress Possible Resolutions Date Resolved   Physician    Behavior;Medical stability        see medical progress notes      Nursing                  PT                    OT                  SLP                SW                 Discharge Planning/Teaching Needs:  Return home with wife who can provide 24/7 support.  Teaching completed with wife and daughter.   Team Discussion:  Pt has progressed very well the past few days and team feels can d/c sooner than targeted.  Supervision to CGA overall with all therapies.  Recommending OP f/u.  Family ed completed today.  Revisions to Treatment Plan:  NA    Continued Need for Acute Rehabilitation Level of Care: The patient requires daily medical management by a physician with specialized training in physical medicine and rehabilitation for the following conditions: Daily direction of a multidisciplinary physical rehabilitation program to ensure safe treatment while eliciting the highest outcome that is of practical value to the patient.: Yes Daily medical management of patient stability for increased activity during participation in an intensive rehabilitation regime.: Yes Daily analysis of laboratory values and/or radiology reports with any subsequent need for medication adjustment of medical intervention for : Neurological problems;Cardiac problems   I attest that I was present, lead the team conference, and concur with the assessment and plan of the team.   Abdurrahman Petersheim 09/17/2018, 4:00 PM

## 2018-09-17 NOTE — Progress Notes (Signed)
[place] PHYSICAL MEDICINE & REHABILITATION PROGRESS NOTE   Subjective/Complaints: Pt eating breakfast. A little restless last night. Aware that he's going home tomorrow  ROS: Limited due to cognitive/behavioral   Objective:   No results found. Recent Labs    09/17/18 0733  WBC 11.8*  HGB 12.0*  HCT 38.0*  PLT 545*   Recent Labs    09/15/18 0834  NA 136  K 3.9  CL 102  CO2 26  GLUCOSE 137*  BUN 13  CREATININE 0.88  CALCIUM 9.1    Intake/Output Summary (Last 24 hours) at 09/17/2018 0836 Last data filed at 09/17/2018 0815 Gross per 24 hour  Intake 680 ml  Output 925 ml  Net -245 ml     Physical Exam: Vital Signs Blood pressure (!) 147/86, pulse 97, temperature 97.6 F (36.4 C), temperature source Oral, resp. rate 19, height 5\' 6"  (1.676 m), weight 86 kg, SpO2 100 %. Constitutional: No distress . Vital signs reviewed. HEENT: EOMI, oral membranes moist Neck: supple Cardiovascular: RRR without murmur. No JVD    Respiratory: CTA Bilaterally without wheezes or rales. Normal effort    GI: BS +, non-tender, non-distended  Musc: No edema or tenderness in extremities. Neurologic: Alert and oriented to person, hospital, city  Motor: Grossly 4/5 throughout, stable Psych: flat but cooperative    Assessment/Plan: 1. 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  Physiatrist is providing close team supervision and 24 hour management of active medical problems listed below.  Physiatrist and rehab team continue to assess barriers to discharge/monitor patient progress toward functional and medical goals  Care Tool:  Bathing    Body parts bathed by patient: Right arm, Left arm, Chest, Abdomen, Front perineal area, Buttocks, Right upper leg, Left upper leg, Right lower leg, Left lower leg, Face   Body parts bathed by helper: Right arm, Left arm, Front perineal area,  Buttocks, Right upper leg, Left upper leg, Right lower leg, Left lower leg, Face     Bathing assist Assist Level: Supervision/Verbal cueing(LH sponge)     Upper Body Dressing/Undressing Upper body dressing   What is the patient wearing?: Pull over shirt    Upper body assist Assist Level: Set up assist    Lower Body Dressing/Undressing Lower body dressing      What is the patient wearing?: Pants     Lower body assist Assist for lower body dressing: Supervision/Verbal cueing     Toileting Toileting Toileting Activity did not occur (Clothing management and hygiene only): N/A (no void or bm)  Toileting assist Assist for toileting: Supervision/Verbal cueing     Transfers Chair/bed transfer  Transfers assist     Chair/bed transfer assist level: Contact Guard/Touching assist     Locomotion Ambulation   Ambulation assist      Assist level: Minimal Assistance - Patient > 75% Assistive device: Walker-rolling Max distance: 150   Walk 10 feet activity   Assist     Assist level: Minimal Assistance - Patient > 75% Assistive device: Walker-rolling   Walk 50 feet activity   Assist    Assist level: Minimal Assistance - Patient > 75% Assistive device: Walker-rolling    Walk 150 feet activity   Assist Walk 150 feet activity did not occur: Safety/medical concerns  Assist level: Minimal Assistance - Patient > 75%      Walk 10 feet on uneven surface  activity   Assist Walk 10 feet  on uneven surfaces activity did not occur: Safety/medical concerns         Wheelchair     Assist Will patient use wheelchair at discharge?: Yes Type of Wheelchair: Manual    Wheelchair assist level: Minimal Assistance - Patient > 75% Max wheelchair distance: 30'    Wheelchair 50 feet with 2 turns activity    Assist    Wheelchair 50 feet with 2 turns activity did not occur: Safety/medical concerns       Wheelchair 150 feet activity     Assist  Wheelchair 150 feet activity did not occur: Safety/medical concerns        Medical Problem List and Plan: 1.  Decline in self care and mobility secondary to meningioma s/p partial resection with IVH  Continue CIR PT,OT, SLP  -finalize ed, dc planning for tomorrow  -Patient to see Rehab MD/provider in the office for transitional care encounter in 1-2 weeks.  2. DVT Prophylaxis/Anticoagulation: Pharmaceutical:Lovenox 3. Pain Management:tylenol prn. 4. Mood:LCSW to follow for evaluation and support. 5. Neuropsych: This patientis notcapable of making decisions on hisown behalf.  Continue enclosure bed for safety until discharge. Family educated on safety needs and fall risk 6. Skin/Wound Care:Routine pressure relief measures. 7. Fluids/Electrolytes/Nutrition:Monitor I/O.  Improved intake on 09/13/2018, 955 mL  hypokalemia: 4.5 on 2/26, supplementation decreased on 2/26  hypomagnesia: 1.7 on 2/24, continue supplementation  Labs ordered for Monday  prostat for low albumin  encourage PO 8. Recurrent SVT: Resolved. Continueamiodarone and metoprolol.Continue to monitor HR bid.   9. HTN: ON Lisinopril, Microzide, Metoprolol and Norvasc. Monitor BP bid   Controlled on 3/3 10. Delirium/Hallucinations:   Continue seroquel bid prn  -maintain seroquel 50mg  qhs  Ativan DC'd 11. T2DM:   Pt on glucotrol 5mg  bid ac at home.   Glucotrol 2.5mg  bid ac increased to 5 twice daily on 2/24, increased to 7.5 twice daily on 2/27---increased to 10mg  bid 3/3  -sugars showing signs of improvement today CBG (last 3)  Recent Labs    09/16/18 1652 09/16/18 2125 09/17/18 0615  GLUCAP 99 161* 143*    12. OSA/Hypoxia: Needed oxygen with activity 2/20. Resume CPAP    Weaned supplemental oxygen 13.  Acute blood loss anemia  Hemoglobin 11.6 on 2/26  Continue to monitor 14.  Sleep disturbance  ECG reviewed, prolonged QTc.   ECG QTc 513 .  Decreased seroquel as above 15.  Fall  No reported  trauma 16. Prolonged Qt  -hope to be off low dose seroquel soon  LOS: 13 days A FACE TO FACE EVALUATION WAS PERFORMED  Meredith Staggers 09/17/2018, 8:36 AM

## 2018-09-17 NOTE — Progress Notes (Signed)
Speech Language Pathology Discharge Summary  Patient Details  Name: Jason Farrell. MRN: [id number] Date of Birth: 1941/05/23  Today's Date: 09/17/2018 SLP Individual Time: 0820-0915 SLP Individual Time Calculation (min): 55 min   Skilled Therapeutic Interventions: Skilled treatment session focused on cognitive goals. Upon arrival, patient was awake while upright int the wheelchair and appeared confused. However, patient easily redirected. SLP administered the MoCA-version 72. Patient scored 22/30 points with a score of 26 or above considered normal. Patient continues to demonstrates deficits in recall and attention.  Patient's family present and educated on strategies to utilize at home to maximize his recall and overall safety at home including the importance of 24 hour supervision. Handouts also given to reinforce information. All verbalized understanding and agreement. Patient left upright in wheelchair with family present. Continue with current plan of care.   Patient has met 5 of 5 long term goals.  Patient to discharge at overall Min;Mod level.   Reasons goals not met: N/A   Clinical Impression/Discharge Summary: Patient has made functional gains and has met 5 of 5 LTGs this admission. Currently, patient requires overall Min-Mod A verbal cues to complete functional and familiar tasks safely in regards to problem solving, recall, attention and awareness. Patient and family education is complete and patient will discharge home with 24 hour supervision. Patient would benefit from f/u SLP services to maximize his cognitive functioning and overall functional independence.   Care Partner:  Caregiver Able to Provide Assistance: Yes  Type of Caregiver Assistance: Physical;Cognitive  Recommendation:  24 hour supervision/assistance;Outpatient SLP  Rationale for SLP Follow Up: Maximize cognitive function and independence;Reduce caregiver burden   Equipment: N/A   Reasons for discharge:  Treatment goals met;Discharged from hospital   Patient/Family Agrees with Progress Made and Goals Achieved: Yes    [place], [place] 09/17/2018, 11:00 AM

## 2018-09-17 NOTE — Discharge Summary (Signed)
Physician Discharge Summary  Patient ID: Jason Farrell. MRN: [id number] DOB/AGE: 03-27-1941 78 y.o.  Admit date: 09/04/2018 Discharge date: 09/18/2018  Discharge Diagnoses:  Principal Problem:   Meningioma [facility]) Active Problems:   Essential hypertension   Nontraumatic intraventricular intracerebral hemorrhage (HCC)   OSA (obstructive sleep apnea)   Diabetes mellitus type 2 in nonobese (HCC)   Labile blood pressure   Hypomagnesemia   Subacute delirium   Sleep disturbance   Prolonged QT interval   Discharged Condition: stable   Significant Diagnostic Studies: Ct Head W & Wo Contrast  Addendum Date: 08/30/2018   ADDENDUM REPORT: 08/30/2018 22:35 ADDENDUM: In addition to the initially described findings, note made of a 2.2 cm mass within the superficial aspect of the left parotid gland, incompletely visualized. Nonemergent outpatient ENT referral for further workup and evaluation suggested for further evaluation. Electronically Signed   By: Jeannine Boga M.D.   On: 08/30/2018 22:35   Result Date: 08/30/2018 CLINICAL DATA:  Follow-up postsurgical evaluation status post meningioma resection. EXAM: CT HEAD WITHOUT AND WITH CONTRAST TECHNIQUE: Contiguous axial images were obtained from the base of the skull through the vertex without and with intravenous contrast CONTRAST:  40mL OMNIPAQUE IOHEXOL 300 MG/ML  SOLN COMPARISON:  Prior preoperative CT and MRI from 07/21/2018 FINDINGS: Brain: Examination degraded by motion artifact. Postoperative changes from prior parieto-occipital craniotomy for meningioma resection seen. Dural thickening and enhancement subjacent to the craniotomy bone flap most consistent with postoperative changes. Trace residual extra-axial/subdural collection measuring up to 2 mm seen just superficial to the dura. No significant mass effect. Previously seen meningioma positioned at the cerebral aqueduct has been resected. Probable small amount of residual blood  products within the resection cavity. Hypodensity within the adjacent right greater than left splenium could reflect residual edema and/or Peri section ischemic change (series 3, image 20). The adjacent vein of Galen and internal cerebral veins deviated to the left, similar to previous. Suspected small amount of residual tumor adjacent to the vein of Galen, although difficult to discern by CT as any potential residual tumor and adjacent vein enhanced to similar attenuation. Small amount of degenerating blood products seen layering within the occipital horn of the right lateral ventricle. Trace residual pneumocephalus at the right frontal horn. Overall ventricular size perhaps slightly decreased from preoperative exam. No significant midline shift. Basilar cisterns remain patent. Underlying age-related cerebral atrophy with chronic microvascular ischemic disease again noted. Remote left basal ganglia lacunar infarct unchanged. Mineralization/calcification at the lateral right thalamus unchanged. No other acute large vessel territory infarct. Vascular: Calcified atherosclerosis at the skull base. Normal intravascular enhancement seen within the intracranial circulation following contrast administration. Skull: Post craniotomy changes noted posteriorly. Right frontal burr hole from previous CBD noted. Scalp soft tissues demonstrate no acute finding. Sinuses/Orbits: Globes and orbital soft tissues within normal limits. Paranasal sinuses are largely clear. No mastoid effusion. Other: None. IMPRESSION: 1. Postoperative changes from interval posterior craniotomy for meningioma resection. Probable small amount of residual blood products at the resection cavity. Parenchymal hypodensity within the adjacent splenium could reflect residual edema and/or peri resection ischemic changes. No obvious gross residual tumor, although possible small amount of residual tumor along the adjacent vein of Galen not excluded by CT. 2.  Sequelae of prior right frontal EVD with trace residual pneumocephalus and small amount of residual intraventricular hemorrhage. Overall ventricular size perhaps slightly decreased from preoperative exam. 3. No other new acute intracranial abnormality. Underlying chronic microvascular ischemic changes with remote left basal ganglia lacunar infarct  unchanged. Electronically Signed: By: Jeannine Boga M.D. On: 08/30/2018 22:18   Mr Jason Farrell UU Contrast  Result Date: 09/02/2018 CLINICAL DATA:  78 y/o  M; status post meningioma resection. EXAM: MRI HEAD WITHOUT AND WITH CONTRAST TECHNIQUE: Multiplanar, multiecho pulse sequences of the brain and surrounding structures were obtained without and with intravenous contrast. CONTRAST:  8.5 cc Gadavist. COMPARISON:  08/30/2018 CT head.  07/21/2018 MRI head. FINDINGS: Brain: Resection cavity superior to the cerebellum and within the splenium of corpus callosum contains T1 hyperintense blood products with susceptibility blooming and smooth thin linear enhancement at the margins compatible postoperative changes. The inferomedial cavity margin there is a nodular focus of enhancement measuring 14 x 11 x 10 mm with similar signal characteristics to the meningioma prior MRI compatible with residual tumor (series 11, image 22 and series 14, image 13). Residual tumor is associated with the vein of Galen as well as the origins of internal cerebral veins. Within the splenium of corpus callosum and right forceps of corpus callosum there is reduced diffusion compatible with ischemia. Stable ventricle size. Trace volume of hemorrhage is present within the occipital horn of the right lateral ventricle. There is a thin extradural postop fluid collection subjacent to parieto-occipital craniotomy without associated mass effect. There is an additional thin 4 mm subdural collection over the left parietal lobe. Trace volume of hemorrhage along the tentorium/posterior falx. No mass effect  or herniation. Small chronic lacunar infarct within the left basal ganglia with hemosiderin staining. Stable punctate nonspecific T2 FLAIR hyperintensities in subcortical and periventricular white matter are compatible with mild chronic microvascular ischemic changes. Stable mild volume loss of the brain. Vascular: Normal flow voids. Skull and upper cervical spine: Normal marrow signal. Sinuses/Orbits: Negative. Other: None. IMPRESSION: 1. Postoperative changes related to parieto-occipital craniotomy and resection of meningioma. Small volume of associated intracranial hemorrhage without mass effect. 2. 14 mm focus of residual nodular enhancement at the inferomedial cavity margin compatible with residual tumor. 3. Acute ischemia with splenium in right forceps of splenium of corpus callosum. 4. Stable lateral and third ventricle size. No new focus of mass effect. No herniation. Electronically Signed   By: Kristine Garbe M.D.   On: 09/02/2018 13:42   Dg Chest Port 1 View  Result Date: 08/28/2018 CLINICAL DATA:  Cough. EXAM: PORTABLE CHEST 1 VIEW COMPARISON:  07/21/2018 FINDINGS: The heart is within normal limits in size and stable. There is tortuosity and calcification of the thoracic aorta. Chronic bronchitic lung changes but no definite infiltrates, effusions or edema. The bony structures are intact. Stable degenerative changes involving both shoulders. IMPRESSION: No acute cardiopulmonary findings. Electronically Signed   By: Marijo Sanes M.D.   On: 08/28/2018 19:42   Dg Swallowing Func-speech Pathology  Result Date: 09/01/2018 Objective Swallowing Evaluation: Type of Study: MBS-Modified Barium Swallow Study  Patient Details Name: Jason Farrell. MRN: [id number] Date of Birth: 12/27/1940 Today's Date: 09/01/2018 Time: SLP Start Time (ACUTE ONLY): [phone] -SLP Stop Time (ACUTE ONLY): 0902 SLP Time Calculation (min) (ACUTE ONLY): 16 min Past Medical History: Past Medical History: Diagnosis Date .  Coronary artery disease  . Diabetes mellitus without complication ([place])  . Dyslipidemia  . GERD (gastroesophageal reflux disease)  . Hyperlipidemia  . Hypertension  . MI (myocardial infarction) ([place])  . Osteoarthritis  Past Surgical History: Past Surgical History: Procedure Laterality Date . CARDIAC CATHETERIZATION  2006  stent placement . COLONOSCOPY   . CORONARY ANGIOPLASTY WITH STENT PLACEMENT  2006  stent x 2  .  CORONARY ANGIOPLASTY WITH STENT PLACEMENT  1996  stent x 3  . FOOT SURGERY Right  . HALLUX VALGUS CHEILECTOMY Right 02/25/2015  Procedure: HALLUX VALGUS CHEILECTOMY;  Surgeon: [name], DPM;  Location: [facility] ORS;  Service: Podiatry;  Laterality: Right; HPI: Patient is a 78 year old male with history ofCAD, T2DM, HTN, OSA--compliant with CPAP who was evaluated in the ED01/06/20with3-4 week history of intermittent bouts of confusionwith lability,headaches, difficulty speaking and visual changes. 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 He was referred [facility] and underwent Craniotomy withresection of pineal region massand placement of EVDon 08/11/2017 by Dr. [name]. Post procedure treated with decadron and IV Keppra X 7 days. Drain removed with 1/30 and on 1/31 he developed decrease in LOC requiring transfer to ICU. 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 MRI brain showed small amount of hemorrhage at resection site with focal hemorrhage/clot within right ventricle and unchanged appearance of increased T2/Flair signal. CTA head/neck done reveling narrow/diminshed flow in right V4 segment question due to dissection, thrombus or vasospasm. Patient also with resultant confusion with agitation and loss of bilateral vision. Neurology felt that patient'sMS changes were felt to bedue to delirium and did not feel seizures were  cause -- d/c Keppra after 7 days. Repeat CT head showed no change in small left parietal SDH with moderate R>L ventriculomegaly and no significant interval change. Ophthalmology (Dr. 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   Plans were to admit patient to CIR on 02/07 but he developed increase in confusion and lethargy.CBC showed rise in WBC to 30 and LP/BC done for work up. CSF/BC negative.Repeat CT head showedimprovement in intraventricular blood and intraventricular air. Leucocytosis felt to be due to steroids.Seroquel added at nights to help with sleep wake disruption and Zyprexa being used prn agitation during the day. He developed SVT with HR up to 150's on 2/11 and lopressor added with resolution. On dysphagia 3 diet with question of aspiration.   He continues to be confused, is oriented to self, year and situation.  He has visual deficits and responds to visual threat at times, continues to wax and wane with poor safety awarenss and impulsivity.  He was subsequently transferred from CIR due to SVT.  Patient with CT head showing Postoperative changes from interval posterior craniotomy for  Subjective: pt awake sitting in chair Assessment / Plan / Recommendation CHL IP CLINICAL IMPRESSIONS 09/01/2018 Clinical Impression Pt presents with functional oropharyngeal swallow ability without aspiration of any consistency tested despite taxed to consume sequential boluses.  He did demonstrate minimal laryngeal penetration however.  Overall swallow was strong and timely with only minimal residuals *oropharyngeal* and cued dry swallow helpful to clear.  Recommend pt consume regular/thin diet.  Largest aspiration risk factor will be his respiratory and cognitive status - thus SlP will follow up to educate family/pt and assure tolerance.  Thanks for this consult.  SLP Visit Diagnosis -- Attention and concentration  deficit following -- Frontal lobe and executive function deficit following -- Impact on safety and function --   CHL IP TREATMENT RECOMMENDATION 09/01/2018 Treatment Recommendations Therapy as outlined in treatment plan below   Prognosis 09/01/2018 Prognosis for Safe Diet Advancement Good Barriers to Reach Goals -- Barriers/Prognosis Comment -- CHL IP DIET RECOMMENDATION 09/01/2018 SLP  Diet Recommendations Regular solids;Thin liquid Liquid Administration via Cup;Straw Medication Administration Whole meds with liquid Compensations Slow rate;Small sips/bites Postural Changes Remain semi-upright after after feeds/meals (Comment);Seated upright at 90 degrees   CHL IP OTHER RECOMMENDATIONS 09/01/2018 Recommended Consults -- Oral Care Recommendations Oral care BID Other Recommendations --   CHL IP FOLLOW UP RECOMMENDATIONS 08/31/2018 Follow up Recommendations Other (comment)   CHL IP FREQUENCY AND DURATION 09/01/2018 Speech Therapy Frequency (ACUTE ONLY) min 1 x/week Treatment Duration 1 week      CHL IP ORAL PHASE 09/01/2018 Oral Phase WFL Oral - Pudding Teaspoon -- Oral - Pudding Cup -- Oral - Honey Teaspoon -- Oral - Honey Cup -- Oral - Nectar Teaspoon WFL Oral - Nectar Cup WFL Oral - Nectar Straw -- Oral - Thin Teaspoon WFL Oral - Thin Cup WFL Oral - Thin Straw WFL Oral - Puree WFL Oral - Mech Soft WFL Oral - Regular -- Oral - Multi-Consistency -- Oral - Pill WFL Oral Phase - Comment --  CHL IP PHARYNGEAL PHASE 09/01/2018 Pharyngeal Phase Impaired Pharyngeal- Pudding Teaspoon -- Pharyngeal -- Pharyngeal- Pudding Cup -- Pharyngeal -- Pharyngeal- Honey Teaspoon -- Pharyngeal -- Pharyngeal- Honey Cup -- Pharyngeal -- Pharyngeal- Nectar Teaspoon WFL Pharyngeal -- Pharyngeal- Nectar Cup WFL Pharyngeal -- Pharyngeal- Nectar Straw -- Pharyngeal -- Pharyngeal- Thin Teaspoon WFL;Penetration/Aspiration during swallow Pharyngeal -- Pharyngeal- Thin Cup [facility];Penetration/Aspiration during swallow Pharyngeal Material enters airway, remains  ABOVE vocal cords then ejected out Pharyngeal- Thin Straw WFL;Penetration/Aspiration during swallow Pharyngeal Material enters airway, remains ABOVE vocal cords then ejected out Pharyngeal- Puree WFL Pharyngeal -- Pharyngeal- Mechanical Soft WFL Pharyngeal -- Pharyngeal- Regular -- Pharyngeal -- Pharyngeal- Multi-consistency -- Pharyngeal -- Pharyngeal- Pill WFL Pharyngeal -- Pharyngeal Comment minimal pharyngeal residuals present that pt did not sense but cued dry swallows helpful to decrease, trace laryngeal penetration of lqiuids - no aspiration noted despite sequential boluses provided  CHL IP CERVICAL ESOPHAGEAL PHASE 09/01/2018 Cervical Esophageal Phase WFL Pudding Teaspoon -- Pudding Cup -- Honey Teaspoon -- Honey Cup -- Nectar Teaspoon -- Nectar Cup -- Nectar Straw -- Thin Teaspoon -- Thin Cup -- Thin Straw -- Puree -- Mechanical Soft -- Regular -- Multi-consistency -- Pill -- Cervical Esophageal Comment -- Macario Golds 09/01/2018, 2:10 PM  Luanna Salk, MS [facility] SLP Acute Rehab Services Pager [phone] Office [phone]              Labs:  Basic Metabolic Panel: BMP Latest Ref Rng & Units 09/17/2018 09/15/2018 09/10/2018  Glucose 70 - 99 mg/dL 170(H) 137(H) 195(H)  BUN 8 - 23 mg/dL 11 13 15   Creatinine 0.61 - 1.24 mg/dL 0.80 0.88 0.81  BUN/Creat Ratio 6 - 22 (calc) - - -  Sodium 135 - 145 mmol/L 138 136 139  Potassium 3.5 - 5.1 mmol/L 3.7 3.9 4.5  Chloride 98 - 111 mmol/L 105 102 104  CO2 22 - 32 mmol/L 22 26 25   Calcium 8.9 - 10.3 mg/dL 9.0 9.1 9.2    CBC: CBC Latest Ref Rng & Units 09/17/2018 09/10/2018 09/05/2018  WBC 4.0 - 10.5 K/uL 11.8(H) 6.3 7.0  Hemoglobin 13.0 - 17.0 g/dL 12.0(L) 11.6(L) 11.9(L)  Hematocrit 39.0 - 52.0 % 38.0(L) 37.4(L) 37.2(L)  Platelets 150 - 400 K/uL 545(H) 608(H) 322    CBG: Recent Labs  Lab 09/17/18 1202 09/17/18 1702 09/17/18 2148 09/18/18 0422 09/18/18 0643  GLUCAP 158* 159* 170* 155* 141*    Brief HPI:   Jason Farrell is a  78 year old male with history of CAD,  HTN, OSA, reports of intermittent confusion with difficulty speaking and visual changes since 06/2018 work-up revealing large meningioma at level of cerebral aqueduct with vasogenic edema posterior medial temporal lobes and obstructive hydrocephalus.  He underwent craniotomy with resection of pineal region mass on 08/11/2018 by Dr. Marylu Lund.  Postop course significant for issues with mental status changes with bouts of lethargy, confusion, problems with sleep-wake disruption with delirium, loss of bilateral vision as well as issues with leukocytosis and SVT.  Visual loss was felt to be cortical in nature due to PRES per ophthalmology.  SVT treated with low-dose beta-blocker and Zyprexa added to manage agitation.  Therapy was ongoing and CIR was recommended due to functional decline  He was admitted to CIR on 08/28/2018 for intensive rehab program to consist of PT, OT and ST.  He continued to have sleep-wake disruption with confusion but was tolerating therapy.  He had recurrent SVT on 09/01/2018 and was transferred to ICU and started on IV amiodarone for rate control.  2D echo done showing EF of 55% with moderate increase in LV wall and trivial AVR.  Repeat CT head done due to ongoing issues with confusion and agitation and showed postop changes as well as incidental findings of 2.2 cm mass within left parotid gland..  Neurology recommended supportive care for multifactorial delirium.  EEG was negative for epileptiform activity.  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  He was cleared to resume CIR program on 2/20.   Hospital Course: Jason Farrell. was admitted to rehab 09/04/2018 for inpatient therapies to consist of PT, ST and OT at least three hours five days a week. Past admission physiatrist, therapy team and rehab RN have worked together to provide  customized collaborative inpatient rehab. His blood pressures have been monitored on BID  basis and has been stable on Lisinopril, Microzide, Metoprolol and Amlodipine.  Heart rate has been monitored on BID  basis and is controlled on current dose amiodarone and metoprolol. EKG done revealing prolonged QTc and follow up EKG at discharge showed no change.   Sleep-wake disruption with delirium and hallucinations has resolved.  Ativan was DC'd and attempts made to wean off Seroquel but ineffective due to agitation. Family advised to continue to use Seroquel to help manage sundowning and insomnia. Patient likely to improve in home environment and medications can be weaned as able.  Labs done past admission revealed hypomagnesemia therefore supplement ordered.  Hypokalemia has resolved and is stable on low-dose supplement.  Pro-stat was added for low calorie malnutrition.  Cranial incision is C/D/I and is healing well without any signs or symptoms of infection..  He has been weaned off oxygen and has been utilizing CPAP at bedtime.  Acute blood loss anemia has been monitored and H&H is improving. Diabetes has been monitored with ac/hs CBG checks.  Glucotrol was titrated to 10 mg twice daily to help with better control of blood sugars. He has made steady progress during his rehab stay and has had improvement in vision.   He continues to require supervision to min guard assist due to visual and cognitive deficits.  He will continue to receive further follow-up outpatient PT, OT and speech therapy   Rehab course: During patient's stay in rehab weekly team conferences were held to monitor patient's progress, set goals and discuss barriers to discharge. At admission, patient required mod assist with mobility and total assist with  basic self-care task.  He exhibited severe cognitive deficits affecting functional and familiar tasks, language of confusion and cognition was also impacted by visual deficits with  restlessness and lethargy.  He  has had improvement in activity tolerance, balance, postural control as well as ability to compensate for deficits.  He requires supervision for transfer.  He requires supervision to min guard assist for ambulating 250 feet with rolling walker.  He requires tactile cues for placement walker placement, and sequencing , cues to avoid obstacles on left as well as safety.  He continues to demonstrate deficits in recall and attention and requires min to mod verbal cues to complete functional and familiar tasks safely.  MoCA score 22/30 at discharge.  Family was educated on all aspects of care, strategies to help maximize recall as well as overall safety.  24 hours supervision is recommended with physical as well as cognitive tasks.    Disposition: Home  Diet: Heart healthy/carb modified medium  Special Instructions: 1.  Recommend referral to ENT for evaluation of left parotid mass. 2.  Family to provide 24-hour supervision and assist patient with all cognitive tasks.   Discharge Instructions    Ambulatory referral to Physical Medicine Rehab   Complete by:  As directed    1-2 weeks transitional care      Allergies as of 09/18/2018   No Known Allergies     Medication List    STOP taking these medications   Co Q10 200 MG Caps   enoxaparin 40 MG/0.4ML injection Commonly known as:  LOVENOX   Fish Oil 1200 MG Caps   Fluzone High-Dose 0.5 ML injection Generic drug:  Influenza vac split quadrivalent PF   metFORMIN 750 MG 24 hr tablet Commonly known as:  GLUCOPHAGE-XR   OLANZapine zydis 5 MG disintegrating tablet Commonly known as:  ZYPREXA   sitaGLIPtin 100 MG tablet Commonly known as:  JANUVIA   Vitamin D (Cholecalciferol) 25 MCG (1000 UT) Tabs     TAKE these medications   acetaminophen 325 MG tablet Commonly known as:  TYLENOL Take 1-2 tablets (325-650 mg total) by mouth every 4 (four) hours as needed for mild pain.   amiodarone 200 MG  tablet Commonly known as:  PACERONE Take 1 tablet (200 mg total) by mouth daily.   amLODipine 2.5 MG tablet Commonly known as:  NORVASC Take 1 tablet (2.5 mg total) by mouth daily.   aspirin 81 MG tablet Take 81 mg by mouth daily. Notes to patient:  Over the counter.    atorvastatin 40 MG tablet Commonly known as:  LIPITOR Take 1 tablet (40 mg total) by mouth daily.   CENTRUM SILVER PO Take 1 tablet by mouth daily.   docusate sodium 100 MG capsule Commonly known as:  COLACE Take 100 mg by mouth 2 (two) times daily.   glipiZIDE 5 MG tablet Commonly known as:  GLUCOTROL Take 2 tablets (10 mg total) by mouth 2 (two) times daily before a meal. What changed:  how much to take   glucose blood test strip Use as instructed   lisinopril-hydrochlorothiazide 20-12.5 MG tablet Commonly known as:  Zestoretic Take 1 tablet by mouth daily. What changed:  when to take this   magnesium oxide 400 (241.3 Mg) MG tablet Commonly known as:  MAG-OX Take 1 tablet (400 mg total) by mouth 2 (two) times daily.   metoprolol succinate 25 MG 24 hr tablet Commonly known as:  TOPROL-XL Take 1 tablet (25 mg total) by mouth 2 (two)  times daily with a meal.   omeprazole 40 MG capsule Commonly known as:  PRILOSEC Take 1 capsule (40 mg total) by mouth daily.   potassium chloride SA 20 MEQ tablet Commonly known as:  K-DUR,KLOR-CON Take 1 tablet (20 mEq total) by mouth daily.   QUEtiapine 50 MG tablet Commonly known as:  SEROQUEL Take 1-1.5 tablets (50-75 mg total) by mouth at bedtime. What changed:    how much to take  Another medication with the same name was removed. Continue taking this medication, and follow the directions you see here. Notes to patient:  To help with insomnia/agitation at nights. Give him one pill can repeat 1/2 pill if still agitated. Make sure that he uses his CPAP at nights and when napping.       Follow-up Information    Steele Sizer, MD Follow up on 10/17/2018.    Specialty:  Family Medicine Why:  @ 11:00 am (hospital follow up appt) Contact information: [address] [place] [address] [phone]        Meredith Staggers, MD Follow up.   Specialty:  Physical Medicine and Rehabilitation Why:  office will call you with follow up appointment Contact information: [address] [place] [address] [phone]        Theora Gianotti, NP Follow up on 10/20/2018.   Specialties:  Nurse Practitioner, Cardiology, Radiology Why:  Appointment at 1:30 pm/follow up on arrthymia.  Contact information: [address] [place] [address] [phone]        Deetta Perla, MD Follow up on 09/24/2018.   Specialty:  Neurosurgery Why:  Call office to get time for follow up appointment Contact information: [place] [address] [place] [address] [phone]        Augusto Garbe, MD. Call.   Specialty:  Neurosurgery Why:  for follow up appointment Contact information: [facility] [place] [address] [phone]           Signed: Bary Leriche 09/21/2018, 10:58 PM

## 2018-09-17 NOTE — Progress Notes (Signed)
Physical Therapy Discharge Summary  Patient Details  Name: Jason Farrell. MRN: [id number] Date of Birth: July 08, 1941  Today's Date: 09/17/2018    Patient has met 9 of 12 long term goals; for the most part patient is S-min guard with most mobility, however does appear to require ongoing MinA for gait due to visual and cognitive impairments impacting safety with gait, family education has been completed. Did not meet WC goal due to fatigue at EOS today/unable to fully assess.  Patient to discharge at an ambulatory level S-Min guard for bed mobility and transfers, and S-MinA for gait with RW .   Patient's care partner is independent to provide the necessary physical and cognitive assistance at discharge.  Reasons goals not met: visual and cognitive impairments limiting safety during gait, WC distance unable to be assessed due to fatigue/patient refusal   Recommendation:  Patient will benefit from ongoing skilled PT services in outpatient setting to continue to advance safe functional mobility, address ongoing impairments in safety, strength, balance, cognition, coordination, and minimize fall risk.  Equipment: No equipment provided  Reasons for discharge: treatment goals met and discharge from hospital  Patient/family agrees with progress made and goals achieved: Yes  PT Discharge Precautions/Restrictions Precautions Precautions: Fall Precaution Comments: low vision, cognitiv deficits Restrictions Weight Bearing Restrictions: No Vital Signs  Pain Pain Assessment Pain Scale: 0-10 Pain Score: 0-No pain Faces Pain Scale: No hurt Vision/Perception  Vision - Assessment Alignment/Gaze Preference: Chin down Additional Comments: appears to continue to have blurry vision as well as peripheral vision loss- often requires MinA for avoidance of obstacles on L when ambulating  Praxis Praxis: Intact  Cognition Overall Cognitive Status: Impaired/Different from  baseline Arousal/Alertness: Awake/alert Orientation Level: Oriented to person;Oriented to place;Oriented to time;Oriented to situation Attention: Selective Focused Attention: Appears intact Focused Attention Impairment: Verbal basic;Functional basic Selective Attention: Impaired Selective Attention Impairment: Verbal basic;Functional basic Memory: Impaired Memory Impairment: Decreased recall of new information;Decreased short term memory Decreased Short Term Memory: Functional basic Awareness: Impaired Awareness Impairment: Emergent impairment Problem Solving: Impaired Problem Solving Impairment: Functional basic Executive Function: (all impaired due to lower level deficits ) Safety/Judgment: Impaired Sensation Sensation Light Touch: Appears Intact Hot/Cold: Appears Intact Proprioception: Appears Intact Stereognosis: Appears Intact Coordination Gross Motor Movements are Fluid and Coordinated: Yes Fine Motor Movements are Fluid and Coordinated: Yes Motor  Motor Motor: Within Functional Limits Motor - Skilled Clinical Observations: motor skills have much improved over the course of his stay on rehab unit  Motor - Discharge Observations: generalized deconditioning  Mobility Bed Mobility Bed Mobility: Rolling Right;Rolling Left;Supine to Sit;Sit to Supine Rolling Right: Independent Rolling Left: Independent Supine to Sit: Supervision/Verbal cueing Sit to Supine: Supervision/Verbal cueing Transfers Transfers: Sit to Stand;Stand to Sit;Stand Pivot Transfers Sit to Stand: Supervision/Verbal cueing Stand to Sit: Supervision/Verbal cueing Stand Pivot Transfers: Contact Guard/Touching assist Stand Pivot Transfer Details (indicate cue type and reason): RW, VC for safety and sequencing  Transfer (Assistive device): Rolling walker Locomotion  Gait Ambulation: Yes Gait Assistance: Minimal Assistance - Patient > 75% Gait Distance (Feet): 250 Feet Assistive device: Rolling  walker Gait Assistance Details: Tactile cues for placement;Tactile cues for sequencing;Verbal cues for sequencing;Verbal cues for precautions/safety;Verbal cues for safe use of DME/AE;Manual facilitation for placement Gait Assistance Details: MinA when using RW due to tendency to steer to L/difficulty avoiding obstacles on L  Gait Gait: Yes Gait Pattern: Impaired Gait Pattern: Step-through pattern;Decreased step length - left;Decreased step length - right;Decreased dorsiflexion - right;Decreased dorsiflexion - left;Decreased trunk  rotation;Trunk flexed Gait velocity: Decreased Stairs / Additional Locomotion Stairs: Yes Stairs Assistance: Contact Guard/Touching assist Stair Management Technique: One rail Right Number of Stairs: 4 Height of Stairs: 6 Ramp: Supervision/Verbal cueing Curb: Nurse, mental health Mobility: Yes Wheelchair Assistance: Chartered loss adjuster: Both upper extremities Wheelchair Parts Management: Needs assistance Distance: 50'  Trunk/Postural Assessment  Cervical Assessment Cervical Assessment: Exceptions to WFL(in cervical flexion ) Thoracic Assessment Thoracic Assessment: Exceptions to WFL(kyphotic ) Lumbar Assessment Lumbar Assessment: Exceptions to WFL(PPT )  Balance Balance Balance Assessed: Yes Static Sitting Balance Static Sitting - Balance Support: Feet supported Static Sitting - Level of Assistance: 7: Independent Dynamic Sitting Balance Dynamic Sitting - Balance Support: Feet supported Dynamic Sitting - Level of Assistance: 5: Stand by assistance Static Standing Balance Static Standing - Balance Support: During functional activity Static Standing - Level of Assistance: 6: Modified independent (Device/Increase time) Dynamic Standing Balance Dynamic Standing - Balance Support: During functional activity Dynamic Standing - Level of Assistance: 5: Stand by assistance Extremity  Assessment  RUE Assessment RUE Assessment: Not tested LUE Assessment LUE Assessment: Not tested RLE Assessment RLE Assessment: Exceptions to [facility] General Strength Comments: MMT 5/5 ankle dorsiflexors, quads, abductors, hip flexors 4-/5  LLE Assessment LLE Assessment: Exceptions to [facility] General Strength Comments: MMT 5/5 ankle dorsiflexors, quads, abductors, hip flexors 4-/5     Deniece Ree PT, DPT, CBIS  Supplemental Physical Therapist [place]    Pager [phone] Acute Rehab Office [phone]

## 2018-09-17 NOTE — Progress Notes (Signed)
Occupational Therapy Session Note  Patient Details  Name: Jason Farrell. MRN: [id number] Date of Birth: 1940-10-19  Today's Date: 09/17/2018 OT Individual Time: 1345-1500 OT Individual Time Calculation (min): 75 min    Short Term Goals: Week 2:  OT Short Term Goal 1 (Week 2): STG=LTG 2/2 ELOS  Skilled Therapeutic Interventions/Progress Updates:    Pt seen for OT ADL bathing/dressing session. Pt sitting up in w/c at nurses station, agreeable to tx session and denying pain. He was able to recall plan for d/c home tomorrow and was oriented to situation and place with increased time.  In pt's room, completed functional ambulation using RW with close suerpvision-CGA with VCs for awareness to environmental obstacles on R/L side with difficulty problem solving however to maneuver RW around obstacle. Throughout session, pt required multi-modal cuing for safe management and technique of RW.  He completed toilet transfers with close supervision, VCs required for hand palcement on RW/grab bar during transfers. He bathed seated on tub transfer bench at overall supervision level. He stood with support of grab bars to complete pericare/buttock hygiene. Pt then attempting to stand on one LE, placing the other one on top of tub bench in order to reach. Steadying assist required as well as cuing for safety awareness and return to sitting position. He ambulated out of bathroom and dressed seated in standard chair. Increased time and min cuing for clothing orientation awareness, able to recognize shirt was not correctly oriented, however, difficulty correcting. Supervision/VCs for UB and LB dressing. Pt able to recall use of sock aid and LH reacher, hwoever, required VCs for correct orientation of sock on sock aid and safety awareness when shoe not fully over heel. Pt provided with elastic shoelaces and provided regarding use, pt able to return demonstrate use. He had great difficulty unthreading standard  shoelaces from shoes, suspect visual and visual spatial deficits for reasoning, able to complete with increased time and min cuing for problem solving.  He completed oral care standing at sink, assist for donning toothpaste to brush 2/2 visual deficits and pt with decreased awarness of toothpaste all over hand, requiring multiple VCs for awareness.  Pt requesting to return to bed at end of session, left in inclosure bed with all needs in reach.   Therapy Documentation Precautions:  Precautions Precautions: Fall Precaution Comments: low vision, cognitiv deficits Restrictions Weight Bearing Restrictions: No Pain: Pain Assessment Pain Scale: 0-10 Pain Score: 0-No pain Faces Pain Scale: No hurt   Therapy/Group: Individual Therapy  Ysenia Filice L 09/17/2018, 12:34 PM

## 2018-09-17 NOTE — Progress Notes (Signed)
Per nurse tech on [phone] RN tried to place pap for nighttime use and pt ripped off mask. RT attempted to place mask on pt but pt combative at this time. RT will continue to monitor.

## 2018-09-17 NOTE — Progress Notes (Signed)
Physical Therapy Session Note  Patient Details  Name: Jason Farrell. MRN: [id number] Date of Birth: [birth year]  Today's Date: 09/17/2018 PT Individual Time: 1100-1200 PT Individual Time Calculation (min): 60 min   Short Term Goals: Week 2:  PT Short Term Goal 1 (Week 2): = LTG  Skilled Therapeutic Interventions/Progress Updates:     Patient received in enclosure bed with family present, very pleasant and willing to participate in PT session. Required MinA to get out of enclosure bed, but then able to perform functional transfers with S and RW, gait distances of up tp 251f with RW and MinA due to visual and cognitive impairments. Continued practice of bed mobility, car transfers, gait over uneven surfaces, and stair training, patient very functional and able to perform most mobility without difficulty. Attempted to have BM with S today but unable, only passing gas. Finished session with cognitive remediation activity and able to complete low difficulty puzzle with Mod cues and extended time. Family without any concerns or questions related to return home. [facility] and nurse tech present and attending, all needs otherwise met.   Therapy Documentation Precautions:  Precautions Precautions: Fall Restrictions Weight Bearing Restrictions: No General:   Vital Signs:  Pain: Pain Assessment Pain Scale: 0-10 Pain Score: 0-No pain Faces Pain Scale: No hurt    Therapy/Group: Individual Therapy  KDeniece ReePT, DPT, CBIS  Supplemental Physical Therapist [facility]   Pager [phone] Rehab Office [phone]  09/17/2018, 12:19 PM

## 2018-09-18 LAB — GLUCOSE, CAPILLARY
Glucose-Capillary: 155 mg/dL — ABNORMAL HIGH (ref 70–99)
Glucose-Capillary: 141 mg/dL — ABNORMAL HIGH (ref 70–99)

## 2018-09-18 MED ORDER — AMIODARONE HCL 200 MG PO TABS: 200.0000 mg | ORAL_TABLET | Freq: Every day | ORAL | 0 refills | Status: DC

## 2018-09-18 MED ORDER — ATORVASTATIN CALCIUM 40 MG PO TABS: 40.0000 mg | ORAL_TABLET | Freq: Every day | ORAL | 0 refills | Status: DC

## 2018-09-18 MED ORDER — QUETIAPINE FUMARATE 50 MG PO TABS: 50.0000 mg | ORAL_TABLET | Freq: Every day | ORAL | 0 refills | Status: DC

## 2018-09-18 MED ORDER — METOPROLOL SUCCINATE ER 25 MG PO TB24: 25.0000 mg | ORAL_TABLET | Freq: Two times a day (BID) | ORAL | 0 refills | Status: DC

## 2018-09-18 MED ORDER — AMLODIPINE BESYLATE 2.5 MG PO TABS: 2.5000 mg | ORAL_TABLET | Freq: Every day | ORAL | 0 refills | Status: DC

## 2018-09-18 MED ORDER — LISINOPRIL-HYDROCHLOROTHIAZIDE 20-12.5 MG PO TABS: 1.0000 | ORAL_TABLET | Freq: Two times a day (BID) | ORAL | 0 refills | Status: DC

## 2018-09-18 MED ORDER — LISINOPRIL-HYDROCHLOROTHIAZIDE 20-12.5 MG PO TABS: 1.0000 | ORAL_TABLET | Freq: Every day | ORAL | 0 refills | Status: DC

## 2018-09-18 MED ORDER — MAGNESIUM OXIDE 400 (241.3 MG) MG PO TABS: 400.0000 mg | ORAL_TABLET | Freq: Two times a day (BID) | ORAL | 0 refills | Status: AC

## 2018-09-18 MED ORDER — GLIPIZIDE 5 MG PO TABS: 10.0000 mg | ORAL_TABLET | Freq: Two times a day (BID) | ORAL | 0 refills | Status: DC

## 2018-09-18 MED ORDER — POTASSIUM CHLORIDE CRYS ER 20 MEQ PO TBCR: 20.0000 meq | EXTENDED_RELEASE_TABLET | Freq: Every day | ORAL | 0 refills | Status: DC

## 2018-09-18 MED ORDER — ACETAMINOPHEN 325 MG PO TABS: 325.0000 mg | ORAL_TABLET | ORAL | Status: AC | PRN

## 2018-09-18 NOTE — Progress Notes (Addendum)
PHYSICAL MEDICINE & REHABILITATION PROGRESS NOTE   Subjective/Complaints: Restless again last night. Didn't tolerate CPAP, pulled off. Up at nurse station this morning  ROS: Limited due to cognitive/behavioral   Objective:   No results found. Recent Labs    09/17/18 0733  WBC 11.8*  HGB 12.0*  HCT 38.0*  PLT 545*   Recent Labs    09/17/18 0733  NA 138  K 3.7  CL 105  CO2 22  GLUCOSE 170*  BUN 11  CREATININE 0.80  CALCIUM 9.0    Intake/Output Summary (Last 24 hours) at 09/18/2018 0836 Last data filed at 09/18/2018 0516 Gross per 24 hour  Intake 120 ml  Output 650 ml  Net -530 ml     Physical Exam: Vital Signs Blood pressure (!) 154/72, pulse 84, temperature (!) 97.5 F (36.4 C), temperature source Oral, resp. rate 20, height 5\' 6"  (1.676 m), weight 86 kg, SpO2 100 %. Constitutional: No distress . Vital signs reviewed. HEENT: EOMI, oral membranes moist Neck: supple Cardiovascular: RRR without murmur. No JVD    Respiratory: CTA Bilaterally without wheezes or rales. Normal effort    GI: BS +, non-tender, non-distended   Musc: No edema or tenderness in extremities. Neurologic: Alert and oriented to person, hospital, city  Motor: Grossly 4/5 throughout, stable Psych: flat but cooperative    Assessment/Plan: 1. 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  Physiatrist is providing close team supervision and 24 hour management of active medical problems listed below.  Physiatrist and rehab team continue to assess barriers to discharge/monitor patient progress toward functional and medical goals  Care Tool:  Bathing    Body parts bathed by patient: Right arm, Left arm, Chest, Abdomen, Front perineal area, Buttocks, Right upper leg, Left upper leg, Right lower leg, Left lower leg, Face   Body parts bathed by helper: Right arm, Left arm, Front  perineal area, Buttocks, Right upper leg, Left upper leg, Right lower leg, Left lower leg, Face     Bathing assist Assist Level: Supervision/Verbal cueing(LH sponge)     Upper Body Dressing/Undressing Upper body dressing   What is the patient wearing?: Pull over shirt    Upper body assist Assist Level: Supervision/Verbal cueing    Lower Body Dressing/Undressing Lower body dressing      What is the patient wearing?: Pants     Lower body assist Assist for lower body dressing: Supervision/Verbal cueing     Toileting Toileting Toileting Activity did not occur (Clothing management and hygiene only): N/A (no void or bm)  Toileting assist Assist for toileting: Supervision/Verbal cueing     Transfers Chair/bed transfer  Transfers assist     Chair/bed transfer assist level: Supervision/Verbal cueing     Locomotion Ambulation   Ambulation assist      Assist level: Minimal Assistance - Patient > 75% Assistive device: Walker-rolling Max distance: 238ft   Walk 10 feet activity   Assist     Assist level: Minimal Assistance - Patient > 75% Assistive device: Walker-rolling   Walk 50 feet activity   Assist    Assist level: Minimal Assistance - Patient > 75% Assistive device: Walker-rolling    Walk 150 feet activity   Assist Walk 150 feet activity did not occur: Safety/medical concerns  Assist level: Minimal Assistance - Patient > 75% Assistive device: Walker-rolling    Walk 10 feet on uneven surface  activity   Assist Walk  10 feet on uneven surfaces activity did not occur: Safety/medical concerns   Assist level: Minimal Assistance - Patient > 75% Assistive device: Aeronautical engineer Will patient use wheelchair at discharge?: Yes Type of Wheelchair: Manual    Wheelchair assist level: Supervision/Verbal cueing Max wheelchair distance: 6ft     Wheelchair 50 feet with 2 turns activity    Assist    Wheelchair 50  feet with 2 turns activity did not occur: Safety/medical concerns   Assist Level: Supervision/Verbal cueing   Wheelchair 150 feet activity     Assist Wheelchair 150 feet activity did not occur: Safety/medical concerns   Assist Level: Supervision/Verbal cueing    Medical Problem List and Plan: 1.  Decline in self care and mobility secondary to meningioma s/p partial resection with IVH  Continue CIR PT,OT, SLP  -dc today, needs full supervision at home  -Patient to see Rehab MD/provider in the office for transitional care encounter in 1-2 weeks.  2. DVT Prophylaxis/Anticoagulation: Pharmaceutical:Lovenox 3. Pain Management:tylenol prn. 4. Mood:LCSW to follow for evaluation and support. 5. Neuropsych: This patientis notcapable of making decisions on hisown behalf.  Continue enclosure bed for safety until discharge. Family educated on safety needs and fall risk 6. Skin/Wound Care:Routine pressure relief measures. 7. Fluids/Electrolytes/Nutrition:Monitor I/O.  Improved intake on 09/13/2018, 955 mL  hypokalemia: 4.5 on 2/26, supplementation decreased on 2/26  hypomagnesia: 1.7 on 2/24, continue supplementation  Labs ordered for Monday  prostat for low albumin  encourage PO 8. Recurrent SVT: Resolved. Continueamiodarone and metoprolol.Continue to monitor HR bid.   9. HTN: ON Lisinopril, Microzide, Metoprolol and Norvasc. Monitor BP bid   Controlled on 3/5 10. Delirium/Hallucinations:   Continue seroquel bid prn  -may need higher dose of seroquel, see #16  Ativan DC'd 11. T2DM:   Pt on glucotrol 5mg  bid ac at home.   Glucotrol 2.5mg  bid ac increased to 5 twice daily on 2/24, increased to 7.5 twice daily on 2/27---increased to 10mg  bid 3/3  -sugars showing signs of improvement ---further adjustment of regime at home CBG (last 3)  Recent Labs    09/17/18 2148 09/18/18 0422 09/18/18 0643  GLUCAP 170* 155* 141*    12. OSA/Hypoxia: Needed oxygen with activity 2/20.  Resume CPAP    Weaned supplemental oxygen 13.  Acute blood loss anemia  Hemoglobin 11.6 on 2/26  Continue to monitor 14.  Sleep disturbance  ECG reviewed, prolonged QTc.   ECG QTc 513 .   Wean seroquel as outpt,  15.  Fall  No reported trauma 16. Prolonged Qtc  -re-check ekg today  LOS: 14 days A FACE TO FACE EVALUATION WAS PERFORMED  Meredith Staggers 09/18/2018, 8:36 AM

## 2018-09-18 NOTE — Progress Notes (Signed)
Social Work  Discharge Note  The overall goal for the admission was met for:   Discharge location: Yes - home with wife and daughter able to provide 24/7 assistance  Length of Stay: Yes - 14 days  Discharge activity level: Yes - minimal assistance  Home/community participation: Yes  Services provided included: MD, RD, PT, OT, SLP, RN, TR, Pharmacy, Neuropsych and SW  Financial Services: Medicare and Private Insurance: [employer]  Follow-up services arranged: Outpatient: PT, OT, ST via [facility] and Patient/Family has no preference for HH/DME agencies  Comments (or additional information):  Pt with cognitive impairments.  Primary contact is spouse, Manuela Schwartz @ [phone]  Patient/Family verbalized understanding of follow-up arrangements: Yes  Individual responsible for coordination of the follow-up plan: spouse  Confirmed correct DME delivered: NA - had needed DME    Ginger Leeth

## 2018-09-18 NOTE — Discharge Instructions (Signed)
Inpatient Rehab Discharge Instructions  Jason Farrell. Discharge date and time: 09/18/18   Activities/Precautions/ Functional Status: Activity: no lifting, driving, or strenuous exercise till cleared by MD Diet: cardiac diet and diabetic diet Wound Care: keep wound clean and dry   Functional status:  ___ No restrictions     ___ Walk up steps independently _X__ 24/7 supervision/assistance   ___ Walk up steps with assistance ___ Intermittent supervision/assistance  ___ Bathe/dress independently ___ Walk with walker     _X__ Bathe/dress with assistance ___ Walk Independently    ___ Shower independently ___ Walk with assistance    ___ Shower with assistance _X__ No alcohol     ___ Return to work/school ________   Special Instructions: 1. 24 hours supervision means you have to be standing by him when ever he is up moving/walking and give him cues for safety. 2. Family has to handle/administer his medications.   COMMUNITY REFERRALS UPON DISCHARGE:    Outpatient: PT     OT    ST                   Agency:  [facility] Phone: [phone]                Appointment Date/Time:  THEY WILL CONTACT WIFE DIRECTLY WITH APPOINTMENT TIMES     My questions have been answered and I understand these instructions. I will adhere to these goals and the provided educational materials after my discharge from the hospital.  Patient/Caregiver Signature _______________________________ Date __________  Clinician Signature _______________________________________ Date __________  Please bring this form and your medication list with you to all your follow-up doctor's appointments.

## 2018-09-22 ENCOUNTER — Telehealth: Payer: Self-pay | Admitting: *Deleted

## 2018-09-22 NOTE — Telephone Encounter (Signed)
Transitional care call completed, appointment confirmed, address confirmed, new patient packet mailed  Transitional Care Questions   Questions for our staff to ask patients on Transitional care 48 hour phone call:   1. Are you/is patient experiencing any problems since coming home? Blood sugars are higher than normal. Two medications were discontinued in the hospital.  I sent a message to Dr. Naaman Plummer to address the issue Are there any questions regarding any aspect of care? No  2. Are there any questions regarding medications administration/dosing? No  Are meds being taken as prescribed? Yes Patient should review meds with caller to confirm   3. Have there been any falls? No   4. Has Home Health been to the house and/or have they contacted you? No home health, straight to outpatient If not, have you tried to contact them? Can we help you contact them?   5. Are bowels and bladder emptying properly? Patient having incontinence and urgency. Are there any unexpected incontinence issues? yes If applicable, is patient following bowel/bladder programs? Yes, colace  6. Any fevers, problems with breathing, unexpected pain? No  7. Are there any skin problems or new areas of breakdown? No  8. Has the patient/family member arranged specialty MD follow up (ie cardiology/neurology/renal/surgical/etc)? Yes Can we help arrange?   9. Does the patient need any other services or support that we can help arrange? No   10. Are caregivers following through as expected in assisting the patient? Yes  11. Has the patient quit smoking, drinking alcohol, or using drugs as recommended? Patient does not drink, smoke, or do illegal drugs

## 2018-09-22 NOTE — Telephone Encounter (Signed)
He was taken off meds due to low sugars in the hospital and due to kidney considerations (metformin). He may resume [place]. I assume he's taking 100mg  daily. If he needs an rx, please send to pharmacy [phone], [facility]. Thanks!

## 2018-09-22 NOTE — Telephone Encounter (Signed)
I contacted patients wife for a transitional care call.  She states the main problem her husband is having since hospital discharge is controlling blood sugar.  She states that he was taken off of januvia and metformin.  He remains on glipizide.  His blood sugar is consistently at 200.  The lowest it has been is 135.  Please advise

## 2018-09-23 ENCOUNTER — Ambulatory Visit: Payer: Medicare Other | Attending: Physical Medicine & Rehabilitation | Admitting: Occupational Therapy

## 2018-09-23 ENCOUNTER — Ambulatory Visit: Payer: Medicare Other

## 2018-09-23 ENCOUNTER — Other Ambulatory Visit: Payer: Self-pay

## 2018-09-23 DIAGNOSIS — R4189 Other symptoms and signs involving cognitive functions and awareness: Secondary | ICD-10-CM | POA: Diagnosis not present

## 2018-09-23 DIAGNOSIS — M6281 Muscle weakness (generalized): Secondary | ICD-10-CM | POA: Insufficient documentation

## 2018-09-23 DIAGNOSIS — R41841 Cognitive communication deficit: Secondary | ICD-10-CM | POA: Insufficient documentation

## 2018-09-23 DIAGNOSIS — H547 Unspecified visual loss: Secondary | ICD-10-CM | POA: Diagnosis not present

## 2018-09-23 DIAGNOSIS — R2681 Unsteadiness on feet: Secondary | ICD-10-CM

## 2018-09-23 DIAGNOSIS — R278 Other lack of coordination: Secondary | ICD-10-CM | POA: Diagnosis not present

## 2018-09-23 MED ORDER — SITAGLIPTIN PHOSPHATE 100 MG PO TABS: 100.0000 mg | ORAL_TABLET | Freq: Every day | ORAL | 3 refills | Status: DC

## 2018-09-23 NOTE — Telephone Encounter (Signed)
I contacted patient's wife and instructed to resume Januvia  Along with the glipizide. She verbalized understanding

## 2018-09-23 NOTE — Patient Instructions (Signed)
Access Code: [email or web address]  URL: [email or web address]  Date: 09/23/2018  Prepared by: Roxana Hires   Exercises  Standing Tandem Balance with Counter Support - 3 reps - 30 seconds x 3 for each leg forward hold - 1x daily - 7x weekly  Single Leg Stance - 3 reps - 30 seconds x 3 for each leg forward hold - 1x daily - 7x weekly

## 2018-09-23 NOTE — Therapy (Signed)
[place] MAIN [facility] SERVICES [address] [place], [place], [address] Phone: [phone]   Fax:  [phone]  Physical Therapy Evaluation  Patient Details  Name: Jason Farrell. MRN: [id number] Date of Birth: 14-Dec-1940 Referring Provider (PT): Dr. Naaman Plummer   Encounter Date: 09/23/2018  PT End of Session - 09/25/18 1223    Visit Number  1    Number of Visits  25    Date for PT Re-Evaluation  12/16/18    Authorization Type  eval: 09/23/18    PT Start Time  1015    PT Stop Time  1115    PT Time Calculation (min)  60 min    Equipment Utilized During Treatment  Gait belt    Activity Tolerance  Patient tolerated treatment well    Behavior During Therapy  WFL for tasks assessed/performed       Past Medical History:  Diagnosis Date  . Coronary artery disease   . Diabetes mellitus without complication ([place])   . Dyslipidemia   . GERD (gastroesophageal reflux disease)   . Hyperlipidemia   . Hypertension   . MI (myocardial infarction) ([place])   . Osteoarthritis     Past Surgical History:  Procedure Laterality Date  . CARDIAC CATHETERIZATION  2006   stent placement  . COLONOSCOPY    . CORONARY ANGIOPLASTY WITH STENT PLACEMENT  2006   stent x 2   . CORONARY ANGIOPLASTY WITH STENT PLACEMENT  1996   stent x 3   . FOOT SURGERY Right   . HALLUX VALGUS CHEILECTOMY Right 02/25/2015   Procedure: HALLUX VALGUS CHEILECTOMY;  Surgeon: [name], DPM;  Location: [facility] ORS;  Service: Podiatry;  Laterality: Right;    There were no vitals filed for this visit.  Subjective Assessment - 09/26/18 0759    Subjective  Meningioma resection    Patient is accompained by:  Family member   Wife   Pertinent History  Pt underwent a meningioma resection on 08/11/18. Wife reports that the surgery lasted for 10 hours to get 95% of the meningioma. No seizures before or after the surgery. Pt went to inpatient rehab at [facility] for 3 weeks and was  discharged home last Thursday. No HH PT since that time. Pt is currently walking with a walker. No falls. He was previously not using an assistive. Pt denies any pain currently. Denies dizziness. Pt reports that up to a week or so ago he had visual field cuts which has resolved. He currently complains of "fuzziness" in his vision and occasional doubling of his vision. He wears corrective lenses which haven't been adjusted since the surgery. Has an appointment to see PCP on 10/17/18 and to see neuroopthomalogy on October 23, 2018. Pt would like to get away from using his walker for ambulation. Otherwise pt denies any further changes in his health. Red flags (bowel/bladder changes, saddle paresthesia, personal history of cancer, chills/fever, night sweats, unrelenting pain) Negative    Currently in Pain?  No/denies        SUBJECTIVE Chief complaint: Pt underwent a meningioma resection on 08/11/18. Wife reports that the surgery lasted for 10 hours to get 95% of the meningioma. No seizures before or after the surgery. Pt went to inpatient rehab at [facility] for 3 weeks and was discharged home last Thursday. No HH PT since that time. Pt is currently walking with a walker. No falls. He was previously not using an assistive. Pt denies any pain currently. Denies dizziness.  Pt reports that up to a week or so ago he had visual field cuts which has resolved. He currently complains of "fuzziness" in his vision and occasional doubling of his vision. He wears corrective lenses which haven't been adjusted since the surgery. Has an appointment to see PCP on 10/17/18 and to see neuroopthomalogy on October 23, 2018. Pt would like to get away from using his walker for ambulation. Otherwise pt denies any further changes in his health. Red flags (bowel/bladder changes, saddle paresthesia, personal history of cancer, chills/fever, night sweats, unrelenting pain) Negative   OBJECTIVE  MUSCULOSKELETAL: Tremor: Absent Bulk: Normal Tone:  Normal, no clonus  Posture No gross abnormalities noted in standing or seated posture  Gait No gross abnormalities in gait noted. Ambulates with rolling walker  Strength R/L 4+/4+ Hip flexion 5/5 Hip external rotation 5/5 Hip internal rotation 5/5 Knee extension 5/5 Knee flexion 5/5 Ankle Dorsiflexion   NEUROLOGICAL:  Mental Status Patient is oriented to person, place and time.  Recent memory is impaired, started 6 months prior to surgery and has worsened since surgery Remote memory is intact.  Attention span and concentration are intact.  Expressive speech is intact.  Patient's fund of knowledge is within normal limits for educational level.  Cranial Nerves Visual acuity is diminished. Pt appears to have some loss of vision in L visual field Extraocular muscles are intact  Facial sensation is intact bilaterally  Facial strength is intact bilaterally  Hearing is diminished bilaterally previous to surgery; Palate elevates midline, normal phonation  Shoulder shrug strength is intact  Tongue protrudes midline   Sensation Grossly intact to light touch bilateral LEs as determined by testing dermatomes L2-S2 respectively Proprioception and hot/cold testing deferred on this date  Reflexes Deferred  Coordination/Cerebellar Finger to Nose: WNL Heel to Shin: WNL Rapid alternating movements: WNL Finger Opposition: WNL Pronator Drift: Negative    FUNCTIONAL OUTCOME MEASURES   Results Comments  BERG 51/56 Fall risk, in need of intervention  DGI 20/24 Fall risk, in need of intervention  TUG 11.2 seconds WNL  5TSTS 13.1 seconds WNL  10 Meter Gait Speed Self-selected: 12.4 s = 0.81 m/s; Fastest: 7.5s = 1.33 m/s Self-selected is below community ambulation normal              [facility] PT Assessment - 09/25/18 1218      Assessment   Medical Diagnosis  Pineal mass resection    Referring Provider (PT)  Dr. Naaman Plummer    Onset Date/Surgical Date  08/11/18   [facility]   Hand Dominance  Right    Prior Therapy  OT/PT/ST      Precautions   Precautions  Fall      Restrictions   Weight Bearing Restrictions  No      Balance Screen   Has the patient fallen in the past 6 months  No    Has the patient had a decrease in activity level because of a fear of falling?   No    Is the patient reluctant to leave their home because of a fear of falling?   No      Home Film/video editor residence    Living Arrangements  Spouse/significant other    Available Help at Discharge  Family      Prior Function   Level of Independence  Independent    Vocation  Retired    Leisure  [employer] of  Impairment  Attention;Memory    Current Attention Level  Divided    Memory  Decreased short-term memory    Memory  Impaired      Coordination   Gross Motor Movements are Fluid and Coordinated  --    Fine Motor Movements are Fluid and Coordinated  --    Coordination and Movement Description  --      Bed Mobility   Bed Mobility  --    Rolling Right  --    Rolling Left  --    Right Sidelying to Sit  --    Left Sidelying to Sit  --    Supine to Sit  --    Sit to Supine  --    Sit to Sidelying Left  --      Estate agent  --    Wheelchair Parts Management  --      Standardized Balance Assessment   Standardized Balance Assessment  Berg Balance Test;Dynamic Gait Index;Timed Up and Go Test;Five Times Sit to Stand;10 meter walk test    Five times sit to stand comments   13.1s    10 Meter Walk  12.4 s = 0.81 m/s; Fastest: 7.5s = 1.33 m/s      Berg Balance Test   Sit to Stand  Able to stand without using hands and stabilize independently    Standing Unsupported  Able to stand safely 2 minutes    Sitting with Back Unsupported but Feet Supported on Floor or Stool  Able to sit safely and securely 2 minutes    Stand to Sit  Sits safely with minimal use of hands     Transfers  Able to transfer safely, minor use of hands    Standing Unsupported with Eyes Closed  Able to stand 10 seconds safely    Standing Unsupported with Feet Together  Able to place feet together independently and stand 1 minute safely    From Standing, Reach Forward with Outstretched Arm  Can reach confidently >25 cm (10")    From Standing Position, Pick up Object from Floor  Able to pick up shoe, needs supervision    From Standing Position, Turn to Look Behind Over each Shoulder  Looks behind from both sides and weight shifts well    Turn 360 Degrees  Able to turn 360 degrees safely in 4 seconds or less    Standing Unsupported, Alternately Place Feet on Step/Stool  Able to stand independently and safely and complete 8 steps in 20 seconds    Standing Unsupported, One Foot in Front  Able to plae foot ahead of the other independently and hold 30 seconds    Standing on One Leg  Tries to lift leg/unable to hold 3 seconds but remains standing independently    Total Score  51      Dynamic Gait Index   Level Surface  Normal    Change in Gait Speed  Mild Impairment    Gait with Horizontal Head Turns  Normal    Gait with Vertical Head Turns  Mild Impairment    Gait and Pivot Turn  Normal    Step Over Obstacle  Mild Impairment    Step Around Obstacles  Normal    Steps  Mild Impairment    Total Score  20      Timed Up and Go Test   TUG  Normal TUG    Normal TUG (seconds)  11.2                Objective measurements completed on examination: See above findings.              PT Education - 09/25/18 1223    Education Details  Plan of care and HEP    Person(s) Educated  Patient;Spouse    Methods  Explanation    Comprehension  Verbalized understanding       PT Short Term Goals - 09/25/18 1250      PT SHORT TERM GOAL #1   Title  Pt will be independent with HEP in order to improve strength and balance in order to decrease fall risk and improve function at home and  work.     Time  6    Period  Weeks    Status  New    Target Date  11/04/18        PT Long Term Goals - 09/25/18 1251      PT LONG TERM GOAL #1   Title  Pt will improve BERG by at least 3 points in order to demonstrate clinically significant improvement in balance.    Baseline  09/23/18: 51/56    Time  12    Period  Weeks    Status  New    Target Date  12/16/18      PT LONG TERM GOAL #2   Title  Pt will improve DGI by at least 3 points in order to demonstrate clinically significant improvement in balance and decreased risk for falls.    Baseline  09/23/18: 20/24    Time  12    Period  Weeks    Status  New    Target Date  12/16/18      PT LONG TERM GOAL #3   Title  Pt will increase self-selected 10MWT to >1.0 m/s in order to demonstrate clinically significant improvement in community ambulation.     Baseline  09/23/18: 0.81 m/s    Time  12    Period  Weeks    Status  New    Target Date  12/16/18             Plan - 09/26/18 0759    Clinical Impression Statement  Pt is a pleasant 78 yo male referred for PT s/p meningioma resection 08/11/18. No focal deficits in strength or sensation identified during evaluation. TUG and 5TSTS are WNL. Self-selected 71m gait speed is 0.81 m/s which is slightly below full community ambulation speed. Pt has higher level balance deficits as demonstrated by a BERG of 51/56 and DGI of 20/24. Pt will benefit from PT services to address deficits in balance and mobility in order to return to full function at home.     Personal Factors and Comorbidities  Age;Time since onset of injury/illness/exacerbation;Comorbidity 1;Comorbidity 2    Comorbidities  DM, MI    Examination-Participation Restrictions  Driving    Stability/Clinical Decision Making  Evolving/Moderate complexity    Rehab Potential  Good    PT Frequency  2x / week    PT Duration  12 weeks    PT Treatment/Interventions  ADLs/Self Care Home Management;Aquatic Therapy;Biofeedback;Canalith  Repostioning;Cryotherapy;Electrical Stimulation;Iontophoresis 4mg /ml Dexamethasone;Moist Heat;Traction;Ultrasound;DME Instruction;Gait training;Stair training;Functional mobility training;Therapeutic activities;Therapeutic exercise;Balance training;Neuromuscular re-education;Patient/family education;Manual techniques;Passive range of motion;Dry needling;Vestibular;Visual/perceptual remediation/compensation    PT Next Visit Plan  Have pt complete ABC, consider 6MWT, review HEP, progress balance and strength    PT Home Exercise Plan  Tandem and single leg  balance       Patient will benefit from skilled therapeutic intervention in order to improve the following deficits and impairments:  Decreased balance, Difficulty walking  Visit Diagnosis: Muscle weakness (generalized) - Plan: PT plan of care cert/re-cert  Unsteadiness on feet - Plan: PT plan of care cert/re-cert     Problem List Patient Active Problem List   Diagnosis Date Noted  . Sleep disturbance   . Fall   . Prolonged QT interval   . Subacute delirium   . Labile blood glucose   . OSA (obstructive sleep apnea)   . Diabetes mellitus type 2 in nonobese ([facility])   . Labile blood pressure   . Hypomagnesemia   . Pressure injury of skin 09/02/2018  . PRES (posterior reversible encephalopathy syndrome) 08/30/2018  . Acute blood loss anemia   . Hypoalbuminemia due to protein-calorie malnutrition ([place])   . Hypokalemia   . Leukocytosis   . Encephalopathy   . SVT (supraventricular tachycardia) ([place])   . 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 Debility   . Meningioma ([place])   . 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 Senile purpura ([place]) 02/21/2018  . Type 2 diabetes mellitus with microalbuminuria, without long-term current use of insulin ([place]) 02/21/2018  . Controlled type 2 diabetes mellitus without complication, without long-term current use of insulin ([place]) 11/11/2017  . 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  Obstructive sleep apnea on CPAP 12/09/2015  . Acid reflux 05/06/2015  . Arthritis, degenerative 05/06/2015  . CAD in native artery 01/25/2015  . Diabetes mellitus type 2 in obese ([place]) 08/16/2014  . Hyperlipidemia 08/16/2014  . Essential hypertension 08/16/2014  . Morbid obesity ([place]) 08/16/2014  . Stopped smoking with greater than 30 pack year history 08/16/2014  . COLD (chronic obstructive lung disease) ([place]) 08/16/2014   Phillips Grout PT, DPT, GCS  Huprich,Jason 09/26/2018, 8:02 AM  [place] MAIN [facility] SERVICES [address] [place], [place], [address] Phone: [phone]   Fax:  [phone]  Name: Jason Farrell. MRN: [id number] Date of Birth: 1940-09-16

## 2018-09-24 ENCOUNTER — Encounter: Payer: Self-pay | Admitting: Occupational Therapy

## 2018-09-24 NOTE — Therapy (Signed)
[place] [facility] SERVICES [address] [place], [place], [address] Phone: [phone]   Fax:  [phone]  Occupational Therapy Treatment  Patient Details  Name: Jason Farrell. MRN: [id number] Date of Birth: [birth year] Referring Provider (OT): Vickey Sages Date: 09/23/2018  OT End of Session - 09/24/18 1506    Visit Number  1    Number of Visits  24    Date for OT Re-Evaluation  12/16/18    Authorization Type  Progress report period starting 09/23/2018    OT Start Time  0900    OT Stop Time  1000    OT Time Calculation (min)  60 min    Behavior During Therapy  Flat affect;Impulsive       Past Medical History:  Diagnosis Date  . Coronary artery disease   . Diabetes mellitus without complication ([place])   . Dyslipidemia   . GERD (gastroesophageal reflux disease)   . Hyperlipidemia   . Hypertension   . MI (myocardial infarction) ([place])   . Osteoarthritis     Past Surgical History:  Procedure Laterality Date  . CARDIAC CATHETERIZATION  2006   stent placement  . COLONOSCOPY    . CORONARY ANGIOPLASTY WITH STENT PLACEMENT  2006   stent x 2   . CORONARY ANGIOPLASTY WITH STENT PLACEMENT  1996   stent x 3   . FOOT SURGERY Right   . HALLUX VALGUS CHEILECTOMY Right 02/25/2015   Procedure: HALLUX VALGUS CHEILECTOMY;  Surgeon: [name], DPM;  Location: [facility] ORS;  Service: Podiatry;  Laterality: Right;    There were no vitals filed for this visit.  Subjective Assessment - 09/24/18 1508    Subjective   Pt. likes to be called "[name]". Pt.'s wife reports that she will be with him for 24/7 supervision.    Patient is accompanied by:  Family member    Pertinent History  Pt. is a 78 y.y. male who was diagnosed with a Meningioma s/p partial resection, with IVH. Pt. went to inpatient rehab for therapy. Pt. is now ready for outpatient OT services. Pt. has a  nuero-opthamalogist appointment scheduled in April. Pt. 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    Patient Stated Goals  To be able to see again, and return to woodworking.    Currently in Pain?  No/denies    Pain Score  0-No pain         [facility] OT Assessment - 09/23/18 0922      Assessment   Medical Diagnosis  Meningioma/IVH    Referring Provider (OT)  Naaman Plummer    Onset Date/Surgical Date  08/11/18   [facility]   Hand Dominance  Right    Prior Therapy  OT/PT/ST      Precautions   Precautions  Fall      Restrictions   Weight Bearing Restrictions  No      Balance Screen   Has the patient fallen in the past 6 months  No    Has the patient had a decrease in activity level because of a fear of falling?   No    Is the patient reluctant to leave their home because of a fear of falling?   No      Home  Environment   Family/patient expects to be discharged to:  Private residence    Living Arrangements  Spouse/significant other    Type of [place]  Home Access  --   3   Home Layout  Two level    Alternate Level Stairs - Number of Steps  3    Bathroom Shower/Tub  Tub/Shower unit;Curtain    Shower/tub characteristics  Curtain    Child psychotherapist  Yes    Adaptive equipment  Long-handled Chartered certified accountant - 2 wheels;Shower seat;Grab bars - tub/shower;Hand held Advertising copywriter    Lives With  Spouse      Prior Function   Level of Independence  Independent    Vocation  Retired    Leisure  Woodworking      ADL   Eating/Feeding  [place] secondary to decreased vision. Meals in a bow   Grooming  Brushing hair    Lower Body Bathing  Increased time    Upper Body Dressing  Set up   Cues for  proper position of the shirt   Lower Body Dressing  Independent   Assist with [facility]. Elastic laces.   Youth worker -  Hygiene  Set up    [employer]  Min  guard      IADL   Shopping  Needs to be accompanied on any shopping trip    Meal Prep  Able to complete simple warm meal prep    Medication Management  Is not capable of dispensing or managing own medication    Financial Management  Dependent      Written Expression   Dominant Hand  Right    Handwriting  50% legible   No deviation from the line     Vision - History   Baseline Vision  Wears glasses all the time   Pt. wearing perscription bifocal sungalasses to therapy.   Patient Visual Report  --   [place] double vision, improving now more blurriness.     Vision Assessment   Diplopia Assessment  --   Vertical diplopia.     Activity Tolerance   Activity Tolerance  Tolerates 10-20 min activity with multiple rests      Cognition   Overall Cognitive Status  Impaired/Different from baseline    Area of Impairment  Attention;Following commands;Problem solving    Memory  Impaired      Coordination   Right 9 Hole Peg Test  31 sec.    Left 9 Hole Peg Test  44 sec.      Hand Function   Right Hand Grip (lbs)  63    Right Hand Lateral Pinch  19 lbs    Right Hand 3 Point Pinch  19 lbs    Left Hand Grip (lbs)  49    Left Hand Lateral Pinch  19 lbs    Left 3 point pinch  17 lbs                         OT Education - 09/24/18 1516    Education Details  OT services, POC, goals    Person(s) Educated  Patient    Methods  Explanation;Demonstration    Comprehension  Verbalized understanding;Need further instruction          OT Long Term Goals - 09/24/18 1537      OT LONG TERM GOAL #1   Title  Pt. will increase left grip strength by 10# to be able to be able to open jars,  and containers.    Baseline  09/23/2018: Pt. has limited left grip strength limiting his ability open tight containers.    Time  12    Period  Weeks    Status  New    Target Date  12/16/18      OT LONG TERM GOAL #2   Title  Pt. will improve left hand [facility] skills by 5 sec. in order to be  able manipulate small objects during ADLS.    Baseline  09/23/2018: Left 44 sec.  Right: 31 sec. Pt. has difficulty manipulating small objects.    Time  12    Period  Weeks    Status  New    Target Date  12/16/18      OT LONG TERM GOAL #3   Title  Pt. will independently potential safety hazards 100% of the time duriing ADLS, and IADLs    Baseline  09/23/2018: Impaired safety awareness, and judgement    Time  12    Period  Weeks    Status  New    Target Date  12/16/18      OT LONG TERM GOAL #4   Title  Pt. wil demonstrate visual compensatory strategies 100% of the time within his environment.    Baseline  09/23/2018: Pt. uanble     Time  12    Period  Weeks    Status  New    Target Date  12/16/18      OT LONG TERM GOAL #5   Title  Pt. willl improve writing legibility skills to be able to write, and sign his name with 75% legibility.     Baseline  09/23/2018: 50% legibility in printed, and cursive form    Time  12    Period  Weeks    Status  New    Target Date  12/16/18            Plan - 09/24/18 1517    Clinical Impression Statement  Pt. is a 78 y.o. male who was diagnosed with a Meningioma, s/p partial resection with IVH. Pt. presents with diplopia, blurriness, weakness, impaired left hand grip strength, pinch strength, and [place] skills impacting his ability to safely complete ADL, and IADL functioning. Pt. needs OT skilled services to work on improving LUE functioning in order to be able to engage in daily ADL, and IADL tasks. Pt. also will benefit from education about visual compensatory strrategies a=during ADLS, and IADLs in order to promote safety, and independence.    Occupational performance deficits (Please refer to evaluation for details):  ADL's;IADL's;Social Participation;Leisure    Body Structure / Function / Physical Skills  ADL;Coordination;UE functional use;ROM;FMC;Strength    Cognitive Skills  Attention;Safety Awareness    Rehab Potential  Good    Comorbidities  Affecting Occupational Performance:  Presence of comorbidities impacting occupational performance    Comorbidities impacting occupational performance description:  Multiple medical comorbidities    Modification or Assistance to Complete Evaluation   Min-Moderate modification of tasks or assist with assess necessary to complete eval    OT Frequency  2x / week    OT Duration  12 weeks    OT Treatment/Interventions  Self-care/ADL training;Cognitive remediation/compensation;Energy conservation;Therapeutic activities;Neuromuscular education;Patient/family education;Therapeutic exercise;DME and/or AE instruction;Moist Heat;Balance training    Consulted and Agree with Plan of Care  Patient;Family member/caregiver    Family Member Consulted  [place]       Patient will benefit from skilled therapeutic intervention in order to improve the following  deficits and impairments:  Body Structure / Function / Physical Skills, Cognitive Skills  Visit Diagnosis: Muscle weakness (generalized)  Other lack of coordination  Impaired cognition  Visual impairment    Problem List Patient Active Problem List   Diagnosis Date Noted  . Sleep disturbance   . Fall   . Prolonged QT interval   . Subacute delirium   . Labile blood glucose   . OSA (obstructive sleep apnea)   . Diabetes mellitus type 2 in nonobese ([facility])   . Labile blood pressure   . Hypomagnesemia   . Pressure injury of skin 09/02/2018  . PRES (posterior reversible encephalopathy syndrome) 08/30/2018  . Acute blood loss anemia   . Hypoalbuminemia due to protein-calorie malnutrition ([place])   . Hypokalemia   . Leukocytosis   . Encephalopathy   . SVT (supraventricular tachycardia) ([place])   . 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 Debility   . Meningioma ([place])   . 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 Senile purpura ([place]) 02/21/2018  . Type 2 diabetes mellitus with microalbuminuria, without long-term current use of  insulin ([place]) 02/21/2018  . Controlled type 2 diabetes mellitus without complication, without long-term current use of insulin ([place]) 11/11/2017  . 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 Obstructive sleep apnea on CPAP 12/09/2015  . Acid reflux 05/06/2015  . Arthritis, degenerative 05/06/2015  . CAD in native artery 01/25/2015  . Diabetes mellitus type 2 in obese ([place]) 08/16/2014  . Hyperlipidemia 08/16/2014  . Essential hypertension 08/16/2014  . Morbid obesity ([place]) 08/16/2014  . Stopped smoking with greater than 30 pack year history 08/16/2014  . COLD (chronic obstructive lung disease) ([place]) 08/16/2014    Harrel Carina, MS, OTR/L 09/24/2018, 3:51 PM  [place] [facility] SERVICES [address] [place], [place], [address] Phone: [phone]   Fax:  [phone]  Name: Jason Farrell. MRN: [id number] Date of Birth: Jul 15, 1941

## 2018-09-26 ENCOUNTER — Encounter: Payer: Self-pay | Admitting: Speech Pathology

## 2018-09-26 ENCOUNTER — Ambulatory Visit: Payer: Medicare Other

## 2018-09-26 ENCOUNTER — Other Ambulatory Visit: Payer: Self-pay

## 2018-09-26 ENCOUNTER — Ambulatory Visit: Payer: Medicare Other | Admitting: Speech Pathology

## 2018-09-26 DIAGNOSIS — R4189 Other symptoms and signs involving cognitive functions and awareness: Secondary | ICD-10-CM | POA: Diagnosis not present

## 2018-09-26 DIAGNOSIS — R2681 Unsteadiness on feet: Secondary | ICD-10-CM

## 2018-09-26 DIAGNOSIS — M6281 Muscle weakness (generalized): Secondary | ICD-10-CM | POA: Diagnosis not present

## 2018-09-26 DIAGNOSIS — R41841 Cognitive communication deficit: Secondary | ICD-10-CM

## 2018-09-26 DIAGNOSIS — R278 Other lack of coordination: Secondary | ICD-10-CM | POA: Diagnosis not present

## 2018-09-26 DIAGNOSIS — H547 Unspecified visual loss: Secondary | ICD-10-CM | POA: Diagnosis not present

## 2018-09-26 NOTE — Therapy (Signed)
[place] MAIN [facility] SERVICES [address] [place], [place], [address] Phone: [phone]   Fax:  [phone]  Speech Language Pathology Evaluation  Patient Details  Name: Jason Farrell. MRN: [id number] Date of Birth: August 03, 1940 Referring Provider (SLP): Alger Simons   Encounter Date: 09/26/2018  End of Session - 09/26/18 1504    Visit Number  1    Number of Visits  17    Date for SLP Re-Evaluation  11/26/18    SLP Start Time  0900    SLP Stop Time   1000    SLP Time Calculation (min)  60 min    Activity Tolerance  Patient tolerated treatment well       Past Medical History:  Diagnosis Date  . Coronary artery disease   . Diabetes mellitus without complication ([place])   . Dyslipidemia   . GERD (gastroesophageal reflux disease)   . Hyperlipidemia   . Hypertension   . MI (myocardial infarction) ([place])   . Osteoarthritis     Past Surgical History:  Procedure Laterality Date  . CARDIAC CATHETERIZATION  2006   stent placement  . COLONOSCOPY    . CORONARY ANGIOPLASTY WITH STENT PLACEMENT  2006   stent x 2   . CORONARY ANGIOPLASTY WITH STENT PLACEMENT  1996   stent x 3   . FOOT SURGERY Right   . HALLUX VALGUS CHEILECTOMY Right 02/25/2015   Procedure: HALLUX VALGUS CHEILECTOMY;  Surgeon: [name], DPM;  Location: [facility] ORS;  Service: Podiatry;  Laterality: Right;    There were no vitals filed for this visit.      SLP Evaluation [facility] - 09/26/18 1457      SLP Visit Information   SLP Received On  09/26/18    Referring Provider (SLP)  Alger Simons    Onset Date  09/18/2018    Medical Diagnosis  Resection of Pineal Mass       Subjective   Subjective  "the brain tumor discombobulated everything in my brain"    Patient/Family Stated Goal  Maximize independence.        Pain Assessment   Currently in Pain?  No/denies      General Information   HPI  The patient is a 78 year old man who was diagnosed with a  Meningioma s/p partial resection, with IVH. The patient received inpatient rehab, including speech therapy.  He is now ready for outpatient speech therapy.  The patient was independent prior to hospitalization and was an avid Probation officer.      Prior Functional Status   Cognitive/Linguistic Baseline  Within functional limits      Cognition   Overall Cognitive Status  Impaired/Different from baseline    Area of Impairment  Attention;Memory    Memory  Decreased short-term memory    Memory  Impaired    Executive Function  Reasoning;Sequencing;Organizing;Decision Making      Auditory Comprehension   Overall Auditory Comprehension  Appears within functional limits for tasks assessed      Reading Comprehension   Reading Status  Within funtional limits      Expression   Primary Mode of Expression  Verbal      Verbal Expression   Overall Verbal Expression  Appears within functional limits for tasks assessed      Oral Motor/Sensory Function   Overall Oral Motor/Sensory Function  Appears within functional limits for tasks assessed      Motor Speech   Overall Motor Speech  Appears within functional limits  for tasks assessed      Standardized Assessments   Standardized Assessments   Cognitive Linguistic Quick Test       Cognitive Linguistic Quick Test The Cognitive Linguistic Quick Test (CLQT) was administered to assess the relative status of five cognitive domains: attention, memory, language, executive functioning, and visuospatial skills. Scores from 10 tasks were used to estimate severity ratings (for age groups 18-69 years and 70-89 years) for each domain, a clock drawing task, as well as an overall composite severity rating of cognition.    Task    Score  Criterion Cut Scores Personal Facts  8/8   8  Symbol Cancellation    12/12   10 Confrontation Naming    10/10   11 Clock Drawing     13/13   12 Story Retelling       9/10   5 Symbol Trails      5/10   6 Generative Naming       3/9   4 Design Memory  5/6   4 Mazes        0/8   4 Design Generation    4/13   5  Cognitive Domain  Composite Score Severity Rating Attention   155/215  Mild  Memory   163/185  WNL Executive Function  12/40   Moderate Language   30/37   WNL Visuospatial Skills  58/105   Mild Clock Drawing   13/13   WNL  Composite Severity Rating Mild    SLP Education - 09/26/18 1504    Education Details  Goals of speech therapy    Person(s) Educated  Patient;Spouse    Methods  Explanation    Comprehension  Verbalized understanding         SLP Long Term Goals - 09/26/18 1507      SLP LONG TERM GOAL #1   Title  Patient will demonstrate functional cognitive-communication skills for independent completion of personal responsibilities and leisure activities.    Time  8    Period  Weeks    Status  New    Target Date  11/26/18      SLP LONG TERM GOAL #2   Title  Patient will complete moderately complex executive skills activities with 80% accuracy.    Time  8    Period  Weeks    Status  New    Target Date  11/26/18      SLP LONG TERM GOAL #3   Title  Patient will complete complex auditory attention/vigilance/memory tasks with 80% accuracy.    Time  8    Period  Weeks    Status  New    Target Date  11/26/18       Plan - 09/26/18 1505    Clinical Impression Statement  This 78 year old man, with Meningioma s/p partial resection with IVH, is presenting with mild cognitive communication impairment characterized by deficits in executive function, attention, and visuospatial skills.  The results of the Cognitive Linguistic Quick Test (CLQT) indicate a composite severity rating of mild.  The patient scored with moderate deficits in the realm of executive function and with mild deficits in the realms of attention and visuospatial skills.  He scored within normal limits for, memory, language, and clock drawing.   The patient and his wife both agree that he is much improved but are interested in  maximizing his cognitive function so he can safely resume his usual activities.  The patient will benefit from skilled speech therapy  for restorative and compensatory treatment of cognitive communication impairment to maximize his independence and ease caregiver burden.    Speech Therapy Frequency  2x / week    Duration  Other (comment)   8 weeks   Treatment/Interventions  Cognitive reorganization;Patient/family education    Potential to Achieve Goals  Good    Potential Considerations  Ability to learn/carryover information;Pain level;Family/community support;Co-morbidities;Previous level of function;Cooperation/participation level;Severity of impairments;Medical prognosis    Consulted and Agree with Plan of Care  Patient;Family member/caregiver    Family Member Consulted  Spouse       Patient will benefit from skilled therapeutic intervention in order to improve the following deficits and impairments:   Cognitive communication deficit - Plan: SLP plan of care cert/re-cert    Problem List Patient Active Problem List   Diagnosis Date Noted  . Sleep disturbance   . Fall   . Prolonged QT interval   . Subacute delirium   . Labile blood glucose   . OSA (obstructive sleep apnea)   . Diabetes mellitus type 2 in nonobese (HCC)   . Labile blood pressure   . Hypomagnesemia   . Pressure injury of skin 09/02/2018  . PRES (posterior reversible encephalopathy syndrome) 08/30/2018  . Acute blood loss anemia   . Hypoalbuminemia due to protein-calorie malnutrition ([place])   . Hypokalemia   . Leukocytosis   . Encephalopathy   . SVT (supraventricular tachycardia) ([place])   . 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 Debility   . Meningioma ([place])   . 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 Senile purpura ([place]) 02/21/2018  . Type 2 diabetes mellitus with microalbuminuria, without long-term current use of insulin ([place]) 02/21/2018  . Controlled type 2 diabetes mellitus without  complication, without long-term current use of insulin ([place]) 11/11/2017  . 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 Obstructive sleep apnea on CPAP 12/09/2015  . Acid reflux 05/06/2015  . Arthritis, degenerative 05/06/2015  . CAD in native artery 01/25/2015  . Diabetes mellitus type 2 in obese ([place]) 08/16/2014  . Hyperlipidemia 08/16/2014  . Essential hypertension 08/16/2014  . Morbid obesity ([place]) 08/16/2014  . Stopped smoking with greater than 30 pack year history 08/16/2014  . COLD (chronic obstructive lung disease) ([place]) 08/16/2014   Leroy Sea, MS/CCC- SLP  Lou Miner 09/26/2018, 3:11 PM  [place] MAIN [facility] SERVICES [address] [place], [place], [address] Phone: [phone]   Fax:  [phone]  Name: Jason Farrell. MRN: [id number] Date of Birth: [birth year]

## 2018-09-26 NOTE — Therapy (Signed)
[place] MAIN [facility] SERVICES [address] [place], [place], [address] Phone: [phone]   Fax:  [phone]  Physical Therapy Treatment  Patient Details  Name: Jason Farrell. MRN: [id number] Date of Birth: 1941-07-05 Referring Provider (PT): Dr. Naaman Plummer   Encounter Date: 09/26/2018  PT End of Session - 09/26/18 0815    Visit Number  2    Number of Visits  25    Date for PT Re-Evaluation  12/16/18    Authorization Type  eval: 09/23/18    PT Start Time  0802    PT Stop Time  0842    PT Time Calculation (min)  40 min    Activity Tolerance  Patient tolerated treatment well;No increased pain;Patient limited by fatigue    Behavior During Therapy  [facility] for tasks assessed/performed       Past Medical History:  Diagnosis Date  . Coronary artery disease   . Diabetes mellitus without complication ([place])   . Dyslipidemia   . GERD (gastroesophageal reflux disease)   . Hyperlipidemia   . Hypertension   . MI (myocardial infarction) ([place])   . Osteoarthritis     Past Surgical History:  Procedure Laterality Date  . CARDIAC CATHETERIZATION  2006   stent placement  . COLONOSCOPY    . CORONARY ANGIOPLASTY WITH STENT PLACEMENT  2006   stent x 2   . CORONARY ANGIOPLASTY WITH STENT PLACEMENT  1996   stent x 3   . FOOT SURGERY Right   . HALLUX VALGUS CHEILECTOMY Right 02/25/2015   Procedure: HALLUX VALGUS CHEILECTOMY;  Surgeon: [name], DPM;  Location: [facility] ORS;  Service: Podiatry;  Laterality: Right;    There were no vitals filed for this visit.  Subjective Assessment - 09/26/18 0804    Subjective  Pt doing well this date, reports no updates since last visit.     Pertinent History  Pt underwent a meningioma resection on 08/11/18. Wife reports that the surgery lasted for 10 hours to get 95% of the meningioma. No seizures before or after the surgery. Pt went to inpatient rehab at [facility] for 3 weeks and was discharged home last  Thursday. No HH PT since that time. Pt is currently walking with a walker. No falls. He was previously not using an assistive. Pt denies any pain currently. Denies dizziness. Pt reports that up to a week or so ago he had visual field cuts which has resolved. He currently complains of "fuzziness" in his vision and occasional doubling of his vision. He wears corrective lenses which haven't been adjusted since the surgery. Has an appointment to see PCP on 10/17/18 and to see neuroopthomalogy on October 23, 2018. Pt would like to get away from using his walker for ambulation. Otherwise pt denies any further changes in his health. Red flags (bowel/bladder changes, saddle paresthesia, personal history of cancer, chills/fever, night sweats, unrelenting pain) Negative    Currently in Pain?  No/denies       INTERVENTION THIS DATE  Therapeutic Exercise:  -ABC scale (taken through verbally) -6MWT: supervision level, chair available as needed; 752ft @ 0.45m/s 2/4 DOE *starting vitals: SpO2: 98%; HR: 78bpm **ending vitals: SpO2: 96%; HR 85bpm -STS from chair hands free 2x10, shoulders at 90 degrees flexion (pt counts without cuing)    Neuromuscular Reeducation: -Normal stance on foam x60 seconds (low sway, no LOB) -Narrow stance on foam x60 seconds (low sway, no LOB)  -Narrow stance on foam 3x30seconds (increased sway, performed at  minGuardA, no LOB)  -Anterior Step taps, 12" step, no UE support, alternating pattern x20 -Lateral step taps, 12" step, no UE support 1x10 bilat  -Anterior Step taps, 12" step, no UE support, alternating pattern x20, pt standing on airex foam -Anterior Step taps, 12" step, no UE support, alternating pattern x20, pt standing on airex foam, holding 5000g ball in BUE (several LOB with this activity, performed with intermittent MinA for LOB recovery)     PT Short Term Goals - 09/25/18 1250      PT SHORT TERM GOAL #1   Title  Pt will be independent with HEP in order to improve  strength and balance in order to decrease fall risk and improve function at home and work.     Time  6    Period  Weeks    Status  New    Target Date  11/04/18        PT Long Term Goals - 09/25/18 1251      PT LONG TERM GOAL #1   Title  Pt will improve BERG by at least 3 points in order to demonstrate clinically significant improvement in balance.    Baseline  09/23/18: 51/56    Time  12    Period  Weeks    Status  New    Target Date  12/16/18      PT LONG TERM GOAL #2   Title  Pt will improve DGI by at least 3 points in order to demonstrate clinically significant improvement in balance and decreased risk for falls.    Baseline  09/23/18: 20/24    Time  12    Period  Weeks    Status  New    Target Date  12/16/18      PT LONG TERM GOAL #3   Title  Pt will increase self-selected 10MWT to >1.0 m/s in order to demonstrate clinically significant improvement in community ambulation.     Baseline  09/23/18: 0.81 m/s    Time  12    Period  Weeks    Status  New    Target Date  12/16/18            Plan - 09/26/18 0816    Clinical Impression Statement  Pt arrives for session without acute complaint, no new updates. Verbally completed ABC scale with patient due to visual and cognitive limitations. Pt performs 6MWT to assess functional tolerance of community distances: pt completes with a slow, shuffling gait, flat scuffing foot strike bilat, mild vaulting bilat, but consistent motor patterns from beginning to end, however, noted mild DOE with testing after 3 minutes. Initiated activities to address balance impairment identified during evaluation visit. Pt offered resting breaks throughout as needed.     Clinical Decision Making  Moderate    Rehab Potential  Good    PT Frequency  2x / week    PT Duration  12 weeks    PT Treatment/Interventions  ADLs/Self Care Home Management;Aquatic Therapy;Biofeedback;Canalith Repostioning;Cryotherapy;Electrical Stimulation;Iontophoresis 4mg /ml  Dexamethasone;Moist Heat;Traction;Ultrasound;DME Instruction;Gait training;Stair training;Functional mobility training;Therapeutic activities;Therapeutic exercise;Balance training;Neuromuscular re-education;Patient/family education;Manual techniques;Passive range of motion;Dry needling;Vestibular;Visual/perceptual remediation/compensation    PT Next Visit Plan  Review HEP, progress balance and strength.     PT Home Exercise Plan  Tandem and single leg balance    Consulted and Agree with Plan of Care  Patient       Patient will benefit from skilled therapeutic intervention in order to improve the following deficits and impairments:  Visit Diagnosis: Muscle weakness (generalized)  Unsteadiness on feet     Problem List Patient Active Problem List   Diagnosis Date Noted  . Sleep disturbance   . Fall   . Prolonged QT interval   . Subacute delirium   . Labile blood glucose   . OSA (obstructive sleep apnea)   . Diabetes mellitus type 2 in nonobese ([facility])   . Labile blood pressure   . Hypomagnesemia   . Pressure injury of skin 09/02/2018  . PRES (posterior reversible encephalopathy syndrome) 08/30/2018  . Acute blood loss anemia   . Hypoalbuminemia due to protein-calorie malnutrition ([place])   . Hypokalemia   . Leukocytosis   . Encephalopathy   . SVT (supraventricular tachycardia) ([place])   . 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 Debility   . Meningioma ([place])   . Nontraumatic intraventricular intracerebral hemorrhage ([place])   . Senile purpura ([place]) 02/21/2018  . Type 2 diabetes mellitus with microalbuminuria, without long-term current use of insulin ([place]) 02/21/2018  . Controlled type 2 diabetes mellitus without complication, without long-term current use of insulin ([place]) 11/11/2017  . 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 Obstructive sleep apnea on CPAP 12/09/2015  . Acid reflux 05/06/2015  . Arthritis, degenerative 05/06/2015  . CAD in native artery 01/25/2015  . Diabetes mellitus  type 2 in obese ([place]) 08/16/2014  . Hyperlipidemia 08/16/2014  . Essential hypertension 08/16/2014  . Morbid obesity ([place]) 08/16/2014  . Stopped smoking with greater than 30 pack year history 08/16/2014  . COLD (chronic obstructive lung disease) ([place]) 08/16/2014   8:47 AM, 09/26/18 Etta Grandchild, PT, DPT Physical Therapist - [place] [phone]     Etta Grandchild 09/26/2018, 8:46 AM  [place] MAIN [facility] SERVICES [address] [place], [place], [address] Phone: [phone]   Fax:  [phone]  Name: German Manke. MRN: [id number] Date of Birth: April 12, 1941

## 2018-09-30 ENCOUNTER — Other Ambulatory Visit: Payer: Self-pay

## 2018-09-30 ENCOUNTER — Encounter: Payer: Self-pay | Admitting: Occupational Therapy

## 2018-09-30 ENCOUNTER — Ambulatory Visit: Payer: Medicare Other | Admitting: Occupational Therapy

## 2018-09-30 DIAGNOSIS — R41841 Cognitive communication deficit: Secondary | ICD-10-CM | POA: Diagnosis not present

## 2018-09-30 DIAGNOSIS — M6281 Muscle weakness (generalized): Secondary | ICD-10-CM

## 2018-09-30 DIAGNOSIS — R278 Other lack of coordination: Secondary | ICD-10-CM | POA: Diagnosis not present

## 2018-09-30 DIAGNOSIS — R2681 Unsteadiness on feet: Secondary | ICD-10-CM | POA: Diagnosis not present

## 2018-09-30 DIAGNOSIS — R4189 Other symptoms and signs involving cognitive functions and awareness: Secondary | ICD-10-CM | POA: Diagnosis not present

## 2018-09-30 DIAGNOSIS — H547 Unspecified visual loss: Secondary | ICD-10-CM | POA: Diagnosis not present

## 2018-09-30 NOTE — Therapy (Signed)
[place] MAIN [facility] SERVICES [address] [place], [place], [address] Phone: [phone]   Fax:  [phone]  Occupational Therapy Treatment  Patient Details  Name: Jason Farrell. MRN: [id number] Date of Birth: [birth year] Referring Provider (OT): Vickey Sages Date: 09/30/2018  OT End of Session - 09/30/18 0926    Visit Number  2    Number of Visits  24    Date for OT Re-Evaluation  12/16/18    OT Start Time  0833    OT Stop Time  0915    OT Time Calculation (min)  42 min    Behavior During Therapy  [facility] for tasks assessed/performed       Past Medical History:  Diagnosis Date  . Coronary artery disease   . Diabetes mellitus without complication ([place])   . Dyslipidemia   . GERD (gastroesophageal reflux disease)   . Hyperlipidemia   . Hypertension   . MI (myocardial infarction) ([place])   . Osteoarthritis     Past Surgical History:  Procedure Laterality Date  . CARDIAC CATHETERIZATION  2006   stent placement  . COLONOSCOPY    . CORONARY ANGIOPLASTY WITH STENT PLACEMENT  2006   stent x 2   . CORONARY ANGIOPLASTY WITH STENT PLACEMENT  1996   stent x 3   . FOOT SURGERY Right   . HALLUX VALGUS CHEILECTOMY Right 02/25/2015   Procedure: HALLUX VALGUS CHEILECTOMY;  Surgeon: [name], DPM;  Location: [facility] ORS;  Service: Podiatry;  Laterality: Right;    There were no vitals filed for this visit.  Subjective Assessment - 09/30/18 0924    Subjective   Pt. present to the session today while his wife was in the waiting room.     Patient is accompanied by:  Family member    Pertinent History  Pt. is a 49 y.y. male who was diagnosed with a Meningioma s/p partial resection, with IVH. Pt. went to inpatient rehab for therapy. Pt. is now ready for outpatient OT services. Pt. has a  nuero-opthamalogist appointment scheduled in April. Pt. 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    Patient Stated Goals  To be able to see again, and return to woodworking.    Currently in Pain?  No/denies      OT TREATMENT    Neuro muscular re-education:   Therapeutic Exercise:  Pt. performed gross gripping with grip strengthener. Pt. worked on sustaining grip while grasping pegs and reaching at various heights. Gripper was placed in the 3rd resistive slot with the white resistive spring. Pt. worked on pinch strengthening in the left hand for lateral, and 3pt. pinch using yellow, red, green, and blue resistive clips. Pt. worked on placing the clips at various vertical and horizontal angles. Tactile and verbal cues were required for eliciting the desired movement. Pt. Performed hand strengthening with green theraputty. Pt. required cues for proper technique. Pt. worked on gross grip loop, lateral pinch, 3pt. pinch, gross digit extension, digit abduction, thumb opposition, and lumbical ex,  Pt. required verbal and tactile cues for proper technique.                        OT Education - 09/30/18 0925    Education Details  OT services POC, and goals    Person(s) Educated  Patient    Methods  Explanation;Demonstration    Comprehension  Verbalized understanding;Need further instruction  OT Long Term Goals - 09/24/18 1537      OT LONG TERM GOAL #1   Title  Pt. will increase left grip strength by 10# to be able to be able to open jars, and containers.    Baseline  09/23/2018: Pt. has limited left grip strength limiting his ability open tight containers.    Time  12    Period  Weeks    Status  New    Target Date  12/16/18      OT LONG TERM GOAL #2   Title  Pt. will improve left hand [facility] skills by 5 sec. in order to be able manipulate small objects during ADLS.    Baseline  09/23/2018: Left 44 sec.  Right: 31 sec. Pt. has difficulty manipulating small objects.    Time  12    Period  Weeks    Status  New    Target Date  12/16/18      OT  LONG TERM GOAL #3   Title  Pt. will independently potential safety hazards 100% of the time duriing ADLS, and IADLs    Baseline  09/23/2018: Impaired safety awareness, and judgement    Time  12    Period  Weeks    Status  New    Target Date  12/16/18      OT LONG TERM GOAL #4   Title  Pt. wil demonstrate visual compensatory strategies 100% of the time within his environment.    Baseline  09/23/2018: Pt. uanble     Time  12    Period  Weeks    Status  New    Target Date  12/16/18      OT LONG TERM GOAL #5   Title  Pt. willl improve writing legibility skills to be able to write, and sign his name with 75% legibility.     Baseline  09/23/2018: 50% legibility in printed, and cursive form    Time  12    Period  Weeks    Status  New    Target Date  12/16/18            Plan - 09/30/18 2505    Clinical Impression Statement  Pt. reports that he still plans to see his eye doctor in April for the first follow-up appointment since his hospitalization. Pt. continues to present with limited LUE strength, and [facility] skills. Pt. present swith limited left hand strength, and [facility] skills.  Pt. continues to work on improving UE functioning in preparation for increasing ADL, and IADL functioning.    Occupational performance deficits (Please refer to evaluation for details):  ADL's;IADL's;Social Participation;Leisure    Body Structure / Function / Physical Skills  ADL;Coordination;UE functional use;ROM;FMC;Strength    Cognitive Skills  Attention;Safety Awareness    Rehab Potential  Good    Comorbidities Affecting Occupational Performance:  Presence of comorbidities impacting occupational performance    Comorbidities impacting occupational performance description:  Multiple medical comorbidities    OT Frequency  2x / week    OT Duration  12 weeks    OT Treatment/Interventions  Self-care/ADL training;Cognitive remediation/compensation;Energy conservation;Therapeutic activities;Neuromuscular  education;Patient/family education;Therapeutic exercise;DME and/or AE instruction;Moist Heat;Balance training    Consulted and Agree with Plan of Care  Patient;Family member/caregiver    Family Member Consulted  [place]       Patient will benefit from skilled therapeutic intervention in order to improve the following deficits and impairments:  Body Structure / Function / Physical Skills, Cognitive Skills  Visit Diagnosis: Muscle weakness (generalized)  Other lack of coordination    Problem List Patient Active Problem List   Diagnosis Date Noted  . Sleep disturbance   . Fall   . Prolonged QT interval   . Subacute delirium   . Labile blood glucose   . OSA (obstructive sleep apnea)   . Diabetes mellitus type 2 in nonobese ([facility])   . Labile blood pressure   . Hypomagnesemia   . Pressure injury of skin 09/02/2018  . PRES (posterior reversible encephalopathy syndrome) 08/30/2018  . Acute blood loss anemia   . Hypoalbuminemia due to protein-calorie malnutrition ([place])   . Hypokalemia   . Leukocytosis   . Encephalopathy   . SVT (supraventricular tachycardia) ([place])   . 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 Debility   . Meningioma ([place])   . 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 Senile purpura ([place]) 02/21/2018  . Type 2 diabetes mellitus with microalbuminuria, without long-term current use of insulin ([place]) 02/21/2018  . Controlled type 2 diabetes mellitus without complication, without long-term current use of insulin ([place]) 11/11/2017  . 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 Obstructive sleep apnea on CPAP 12/09/2015  . Acid reflux 05/06/2015  . Arthritis, degenerative 05/06/2015  . CAD in native artery 01/25/2015  . Diabetes mellitus type 2 in obese ([place]) 08/16/2014  . Hyperlipidemia 08/16/2014  . Essential hypertension 08/16/2014  . Morbid obesity ([place]) 08/16/2014  . Stopped smoking with greater than 30 pack year history 08/16/2014  . COLD (chronic  obstructive lung disease) ([place]) 08/16/2014    Harrel Carina, MS, OTR/L 09/30/2018, 12:36 PM  [place] MAIN [facility] SERVICES [address] [place], [place], [address] Phone: [phone]   Fax:  [phone]  Name: Jason Farrell. MRN: [id number] Date of Birth: [birth year]

## 2018-10-01 ENCOUNTER — Encounter: Payer: Medicare Other | Admitting: Physical Medicine & Rehabilitation

## 2018-10-03 ENCOUNTER — Ambulatory Visit: Payer: Medicare Other | Admitting: Speech Pathology

## 2018-10-03 ENCOUNTER — Ambulatory Visit: Payer: Medicare Other

## 2018-10-06 ENCOUNTER — Ambulatory Visit: Payer: Medicare Other | Admitting: Speech Pathology

## 2018-10-07 ENCOUNTER — Encounter: Payer: Self-pay | Admitting: Occupational Therapy

## 2018-10-07 NOTE — Therapy (Signed)
[place] MAIN [facility] SERVICES [address] [place], [place], [address] Phone: [phone]   Fax:  [phone]  Patient Details  Name: Jason Farrell. MRN: [id number] Date of Birth: July 29, 1940 Referring Provider:  No ref. provider found  Encounter Date: 10/07/2018  Attempted to reach out to the pt. via telephone secondary to the pandemic. Left a voicemail message for the the.  Harrel Carina, MS, OTR/L 10/07/2018, 1:42 PM  [place] MAIN [facility] SERVICES [address] [place], [place], [address] Phone: [phone]   Fax:  [phone]

## 2018-10-08 ENCOUNTER — Ambulatory Visit: Payer: Medicare Other | Admitting: Physical Therapy

## 2018-10-08 ENCOUNTER — Ambulatory Visit: Payer: Medicare Other | Admitting: Occupational Therapy

## 2018-10-14 ENCOUNTER — Telehealth: Payer: Self-pay

## 2018-10-14 NOTE — Telephone Encounter (Signed)
Left voicemail message requesting to have patient call back to reschedule 10/20/2018 face to face visit with Ignacia Bayley, NP to a video or telephone visit due to current clinic policies related to COVID 19 virus precautions.

## 2018-10-15 ENCOUNTER — Telehealth: Payer: Self-pay

## 2018-10-15 ENCOUNTER — Encounter: Payer: Managed Care, Other (non HMO) | Admitting: Occupational Therapy

## 2018-10-15 ENCOUNTER — Telehealth: Payer: Self-pay | Admitting: Family Medicine

## 2018-10-15 ENCOUNTER — Ambulatory Visit: Payer: Managed Care, Other (non HMO) | Admitting: Physical Therapy

## 2018-10-15 MED ORDER — AMIODARONE HCL 200 MG PO TABS: 200.0000 mg | ORAL_TABLET | Freq: Every day | ORAL | 0 refills | Status: DC

## 2018-10-15 NOTE — Telephone Encounter (Signed)
Pt left vm stating he needed refills as soon as possible but did not state which ones he needed. If someone could reach out to patient for further information

## 2018-10-15 NOTE — Telephone Encounter (Signed)
Spoke with patient's wife Manuela Schwartz who states that patient was started on amiodarone 200mg  daily during a recent hospitalization. He is almost out of this medication so she is requesting a refill which has been sent to pharmacy as requested. Patient and wife will discuss whether or not he will need to continue taking this medication during his telephone visit with Dr. Rockey Situ on 10/21/2018.

## 2018-10-15 NOTE — Telephone Encounter (Signed)
Virtual Visit Pre-Appointment Phone Call  Steps For Call:  1. Confirm consent - "In the setting of the current Covid19 crisis, you are scheduled for a phone visit with your provider on at 10/21/2018 at 8:20AM.  Just as we do with many in-office visits, in order for you to participate in this visit, we must obtain consent.  If you'd like, I can send this to your mychart (if signed up) or email for you to review.  Otherwise, I can obtain your verbal consent now.  All virtual visits are billed to your insurance company just like a normal visit would be.  By agreeing to a virtual visit, we'd like you to understand that the technology does not allow for your provider to perform an examination, and thus may limit your provider's ability to fully assess your condition.  Finally, though the technology is pretty good, we cannot assure that it will always work on either your or our end, and in the setting of a video visit, we may have to convert it to a phone-only visit.  In either situation, we cannot ensure that we have a secure connection.  Are you willing to proceed?"  2. Give patient instructions for WebEx download to smartphone as below if video visit  3. Advise patient to be prepared with any vital sign or heart rhythm information, their current medicines, and a piece of paper and pen handy for any instructions they may receive the day of their visit  4. Inform patient they will receive a phone call 15 minutes prior to their appointment time (may be from unknown caller ID) so they should be prepared to answer  5. Confirm that appointment type is correct in Epic appointment notes (video vs telephone)    TELEPHONE CALL NOTE  Jason Farrell. has been deemed a candidate for a follow-up tele-health visit to limit community exposure during the Covid-19 pandemic. I spoke with the patient via phone to ensure availability of phone/video source, confirm preferred email & phone number, and discuss  instructions and expectations.  I reminded Jason Farrell. to be prepared with any vital sign and/or heart rhythm information that could potentially be obtained via home monitoring, at the time of his visit. I reminded Jason Farrell. to expect a phone call at the time of his visit if his visit.  Did the patient verbally acknowledge consent to treatment? YES  Jason Farrell 10/15/2018 11:11 AM  CONSENT FOR TELE-HEALTH VISIT - PLEASE REVIEW  I hereby voluntarily request, consent and authorize [facility] and its employed or contracted physicians, physician assistants, nurse practitioners or other licensed health care professionals (the Practitioner), to provide me with telemedicine health care services (the Services") as deemed necessary by the treating Practitioner. I acknowledge and consent to receive the Services by the Practitioner via telemedicine. I understand that the telemedicine visit will involve communicating with the Practitioner through live audiovisual communication technology and the disclosure of certain medical information by electronic transmission. I acknowledge that I have been given the opportunity to request an in-person assessment or other available alternative prior to the telemedicine visit and am voluntarily participating in the telemedicine visit.  I understand that I have the right to withhold or withdraw my consent to the use of telemedicine in the course of my care at any time, without affecting my right to future care or treatment, and that the Practitioner or I may terminate the telemedicine visit at any time. I understand that I  have the right to inspect all information obtained and/or recorded in the course of the telemedicine visit and may receive copies of available information for a reasonable fee.  I understand that some of the potential risks of receiving the Services via telemedicine include:   Delay or interruption in medical evaluation  due to technological equipment failure or disruption;  Information transmitted may not be sufficient (e.g. poor resolution of images) to allow for appropriate medical decision making by the Practitioner; and/or   In rare instances, security protocols could fail, causing a breach of personal health information.  Furthermore, I acknowledge that it is my responsibility to provide information about my medical history, conditions and care that is complete and accurate to the best of my ability. I acknowledge that Practitioner's advice, recommendations, and/or decision may be based on factors not within their control, such as incomplete or inaccurate data provided by me or distortions of diagnostic images or specimens that may result from electronic transmissions. I understand that the practice of medicine is not an exact science and that Practitioner makes no warranties or guarantees regarding treatment outcomes. I acknowledge that I will receive a copy of this consent concurrently upon execution via email to the email address I last provided but may also request a printed copy by calling the office of [place].    I understand that my insurance will be billed for this visit.   I have read or had this consent read to me.  I understand the contents of this consent, which adequately explains the benefits and risks of the Services being provided via telemedicine.   I have been provided ample opportunity to ask questions regarding this consent and the Services and have had my questions answered to my satisfaction.  I give my informed consent for the services to be provided through the use of telemedicine in my medical care  By participating in this telemedicine visit I agree to the above.

## 2018-10-17 ENCOUNTER — Encounter: Payer: Self-pay | Admitting: Family Medicine

## 2018-10-17 ENCOUNTER — Ambulatory Visit (INDEPENDENT_AMBULATORY_CARE_PROVIDER_SITE_OTHER): Payer: Medicare Other | Admitting: Family Medicine

## 2018-10-17 VITALS — BP 135/59 | HR 64 | Temp 98.2°F | Resp 98 | Ht 66.5 in | Wt 205.0 lb

## 2018-10-17 DIAGNOSIS — E1129 Type 2 diabetes mellitus with other diabetic kidney complication: Secondary | ICD-10-CM

## 2018-10-17 DIAGNOSIS — I10 Essential (primary) hypertension: Secondary | ICD-10-CM

## 2018-10-17 DIAGNOSIS — D692 Other nonthrombocytopenic purpura: Secondary | ICD-10-CM

## 2018-10-17 DIAGNOSIS — E785 Hyperlipidemia, unspecified: Secondary | ICD-10-CM | POA: Diagnosis not present

## 2018-10-17 DIAGNOSIS — E1159 Type 2 diabetes mellitus with other circulatory complications: Secondary | ICD-10-CM | POA: Diagnosis not present

## 2018-10-17 DIAGNOSIS — E669 Obesity, unspecified: Secondary | ICD-10-CM | POA: Diagnosis not present

## 2018-10-17 DIAGNOSIS — E1169 Type 2 diabetes mellitus with other specified complication: Secondary | ICD-10-CM

## 2018-10-17 DIAGNOSIS — R809 Proteinuria, unspecified: Secondary | ICD-10-CM | POA: Diagnosis not present

## 2018-10-17 DIAGNOSIS — K219 Gastro-esophageal reflux disease without esophagitis: Secondary | ICD-10-CM | POA: Diagnosis not present

## 2018-10-17 MED ORDER — POTASSIUM CHLORIDE CRYS ER 20 MEQ PO TBCR: 20.0000 meq | EXTENDED_RELEASE_TABLET | Freq: Every day | ORAL | 1 refills | Status: DC

## 2018-10-17 MED ORDER — GLIPIZIDE 5 MG PO TABS: 5.0000 mg | ORAL_TABLET | Freq: Two times a day (BID) | ORAL | 0 refills | Status: DC

## 2018-10-17 MED ORDER — SITAGLIPTIN PHOSPHATE 100 MG PO TABS: 100.0000 mg | ORAL_TABLET | Freq: Every day | ORAL | 1 refills | Status: DC

## 2018-10-17 MED ORDER — AMLODIPINE BESYLATE 2.5 MG PO TABS: 2.5000 mg | ORAL_TABLET | Freq: Every day | ORAL | 1 refills | Status: DC

## 2018-10-17 MED ORDER — METFORMIN HCL ER 750 MG PO TB24: 1500.0000 mg | ORAL_TABLET | Freq: Every day | ORAL | 1 refills | Status: DC

## 2018-10-17 MED ORDER — METOPROLOL SUCCINATE ER 50 MG PO TB24: 50.0000 mg | ORAL_TABLET | Freq: Every day | ORAL | 0 refills | Status: DC

## 2018-10-17 MED ORDER — LISINOPRIL-HYDROCHLOROTHIAZIDE 20-12.5 MG PO TABS: 1.0000 | ORAL_TABLET | Freq: Every day | ORAL | 1 refills | Status: DC

## 2018-10-17 MED ORDER — OMEPRAZOLE 40 MG PO CPDR: 40.0000 mg | DELAYED_RELEASE_CAPSULE | Freq: Every day | ORAL | 1 refills | Status: DC

## 2018-10-17 MED ORDER — ATORVASTATIN CALCIUM 40 MG PO TABS: 40.0000 mg | ORAL_TABLET | Freq: Every day | ORAL | 1 refills | Status: DC

## 2018-10-17 NOTE — Progress Notes (Signed)
Name: Jason Farrell.   MRN: [id number]    DOB: 05/08/1941   Date:10/17/2018       Progress Note  Subjective  Chief Complaint  Chief Complaint  Patient presents with  . Follow-up    I connected with@ on 10/17/18 at 11:00 AM EDT by telephone and verified that I am speaking with the correct person using two identifiers.  I discussed the limitations, risks, security and privacy concerns of performing an evaluation and management service by telephone and the availability of in person appointments. Staff also discussed with the patient that there may be a patient responsible charge related to this service. Patient Location: home Provider Location: [facility] Additional Individuals present: wife, Jason Farrell discharge follow up: Jason Farrell was having intermittent confusion associated with difficulty speaking and visual changes, followed by gait disturbance  since Dec 2019. He was admitted on 07/2017 for worsening of symptoms. He had mengioma resection and had to stay at rehab facility for weeks because of confusion, gait instability, fatigue, also has some double vision ( pending evaluation by ophthalm - neurologist). Wife states since he has been home she weaned him off Seroquel, he is still very tired when he walks and has to take a nap afterwards, occasionally gets confused ( goes to the kitchen thinking he is going to the bathroom ) but episodes are not as often as they used to be. He has also noticed some blurred vision but that may be from DM. His appetite is normal now.   DMII: he was diagnosed many years ago, he was  taking Januvia, Metformin and Glipizide, however since he was discharged from rehab they only gave him glipizide and his glucose was spiking to 200's. Wife requested januvia and fasting is now in the 150's however she would like for him to resume Metformin also.  This was a telephone encounter but asked patient to stop by on  Monday to have A1C done . He denies polyphagia or polydipsia. He has blurred vision. He had dyslipidemia and obesity  Morbid obesity: based on obesity with multiple risk factors. he was recently hospitalized, we will reassess when he comes in for a visit   HTN: bp medication also adjusted after [facility] stay. He had tachycardia and placed on amiodarone that was recently filled by Dr. Rockey Situ, he is on Toprol XL 25 BID and we will switch to 50 mg Daily, he needs refills of other medications also BP at home has been at goal, no chest pain or palpitation  COPD with history of tobacco use: he denies daily cough, not sure if he has SOB with activity because joint problems does not allow him to be very active. No wheezing. Unchanged. Quit smoking in 1996, after MI. Unchanged   S/p MI and and history of stent placement: sees Dr. Rockey Situ , denies chest pain or palpitation.he has a follow up coming up with Dr. 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 Prolonged QT interval   . Hypomagnesemia   . PRES (posterior reversible encephalopathy syndrome) 08/30/2018  . Hypoalbuminemia due to protein-calorie malnutrition ([place])   . Hypokalemia   . Encephalopathy   . SVT (supraventricular tachycardia) ([place])   . 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 Meningioma ([employer])   . Nontraumatic intraventricular intracerebral hemorrhage ([place])   . Status post craniotomy 08/12/2018  . Senile purpura ([place]) 02/21/2018  .  Type 2 diabetes mellitus with microalbuminuria, without long-term current use of insulin ([place]) 02/21/2018  . Type 2 diabetes mellitus without complications ([place]) 46/27/0350  . 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 Obstructive sleep apnea on CPAP 12/09/2015  . Arthritis, degenerative 05/06/2015  . Gastro-esophageal reflux disease without esophagitis 05/06/2015  . CAD in native artery 01/25/2015  . Diabetes mellitus type 2 in obese ([place])  08/16/2014  . Hyperlipidemia 08/16/2014  . Essential hypertension 08/16/2014  . Morbid obesity ([place]) 08/16/2014  . Stopped smoking with greater than 30 pack year history 08/16/2014  . COLD (chronic obstructive lung disease) ([place]) 08/16/2014    Past Surgical History:  Procedure Laterality Date  . CARDIAC CATHETERIZATION  2006   stent placement  . COLONOSCOPY    . CORONARY ANGIOPLASTY WITH STENT PLACEMENT  2006   stent x 2   . CORONARY ANGIOPLASTY WITH STENT PLACEMENT  1996   stent x 3   . CRANIOTOMY  08/12/2018  . FOOT SURGERY Right   . HALLUX VALGUS CHEILECTOMY Right 02/25/2015   Procedure: HALLUX VALGUS CHEILECTOMY;  Surgeon: [name], DPM;  Location: [facility] ORS;  Service: Podiatry;  Laterality: Right;    Family History  Problem Relation Age of Onset  . Hyperlipidemia Father   . Hypertension Father   . Alcohol abuse Father   . Cancer Sister        lung  . Hypertension Sister   . Heart disease Sister   . Hyperlipidemia Sister   . COPD Sister     Social History   Socioeconomic History  . Marital status: Married    Spouse name: Jason Farrell  . Number of children: 3  . Years of education: some college  . Highest education level: 12th grade  Occupational History  . Occupation: Retired  Scientific laboratory technician  . Financial resource strain: Not hard at all  . Food insecurity:    Worry: Never true    Inability: Never true  . Transportation needs:    Medical: No    Non-medical: No  Tobacco Use  . Smoking status: Former Smoker    Packs/day: 1.50    Years: 35.00    Pack years: 52.50    Types: Cigarettes    Start date: 07/17/1959    Last attempt to quit: 08/27/1994    Years since quitting: 24.1  . Smokeless tobacco: Never Used  . Tobacco comment: smoking cessation materials not required  Substance and Sexual Activity  . Alcohol use: Yes    Alcohol/week: 14.0 standard drinks    Types: 14 Glasses of wine per week  . Drug use: No  . Sexual activity: Not Currently    Partners:  Female  Lifestyle  . Physical activity:    Days per week: 3 days    Minutes per session: 30 min  . Stress: Not at all  Relationships  . Social connections:    Talks on phone: Once a week    Gets together: Never    Attends religious service: Never    Active member of club or organization: No    Attends meetings of clubs or organizations: Never    Relationship status: Married  . Intimate partner violence:    Fear of current or ex partner: No    Emotionally abused: No    Physically abused: No    Forced sexual activity: No  Other Topics Concern  . Not on file  Social History Narrative  . Not on file     Current Outpatient Medications:  .  acetaminophen (TYLENOL) 325 MG tablet, Take 1-2 tablets (325-650 mg total) by mouth every 4 (four) hours as needed for mild pain., Disp: , Rfl:  .  amiodarone (PACERONE) 200 MG tablet, Take 1 tablet (200 mg total) by mouth daily., Disp: 30 tablet, Rfl: 0 .  amLODipine (NORVASC) 2.5 MG tablet, Take 1 tablet (2.5 mg total) by mouth daily., Disp: 90 tablet, Rfl: 1 .  aspirin 81 MG tablet, Take 81 mg by mouth daily., Disp: , Rfl:  .  atorvastatin (LIPITOR) 40 MG tablet, Take 1 tablet (40 mg total) by mouth daily., Disp: 90 tablet, Rfl: 1 .  docusate sodium (COLACE) 100 MG capsule, Take 100 mg by mouth 2 (two) times daily., Disp: , Rfl:  .  glipiZIDE (GLUCOTROL) 5 MG tablet, Take 1 tablet (5 mg total) by mouth 2 (two) times daily before a meal., Disp: 180 tablet, Rfl: 0 .  glucose blood test strip, Use as instructed, Disp: 100 each, Rfl: 12 .  lisinopril-hydrochlorothiazide (ZESTORETIC) 20-12.5 MG tablet, Take 1 tablet by mouth daily., Disp: 90 tablet, Rfl: 1 .  magnesium oxide (MAG-OX) 400 (241.3 Mg) MG tablet, Take 1 tablet (400 mg total) by mouth 2 (two) times daily., Disp: 60 tablet, Rfl: 0 .  metoprolol succinate (TOPROL-XL) 50 MG 24 hr tablet, Take 1 tablet (50 mg total) by mouth daily., Disp: 30 tablet, Rfl: 0 .  Multiple Vitamins-Minerals  (CENTRUM SILVER PO), Take 1 tablet by mouth daily. , Disp: , Rfl:  .  omeprazole (PRILOSEC) 40 MG capsule, Take 1 capsule (40 mg total) by mouth daily., Disp: 90 capsule, Rfl: 1 .  potassium chloride SA (K-DUR,KLOR-CON) 20 MEQ tablet, Take 1 tablet (20 mEq total) by mouth daily., Disp: 90 tablet, Rfl: 1 .  sitaGLIPtin (JANUVIA) 100 MG tablet, Take 1 tablet (100 mg total) by mouth daily., Disp: 90 tablet, Rfl: 1 .  metFORMIN (GLUCOPHAGE-XR) 750 MG 24 hr tablet, Take 2 tablets (1,500 mg total) by mouth daily with breakfast., Disp: 180 tablet, Rfl: 1  No Known Allergies  I personally reviewed active problem list, medication list, allergies, family history, social history with the patient/caregiver today.   ROS  Ten systems reviewed and is negative except as mentioned in HPI   Objective  Virtual encounter, vitals not obtained.  Body mass index is 32.59 kg/m.  Physical Exam  Voice was lively over the phone , wife assisted with medication refills and with the history  PHQ2/9: Depression screen [facility] 2/9 10/17/2018 07/21/2018 07/04/2018 02/21/2018 01/23/2018  Decreased Interest 0 0 0 0 0  Down, Depressed, Hopeless 0 0 0 0 0  PHQ - 2 Score 0 0 0 0 0  Altered sleeping 0 1 1 0 0  Tired, decreased energy 0 3 3 0 0  Change in appetite 0 1 1 0 0  Feeling bad or failure about yourself  0 0 0 0 0  Trouble concentrating 0 0 0 0 0  Moving slowly or fidgety/restless 0 0 0 0 0  Suicidal thoughts 0 0 0 0 0  PHQ-9 Score 0 5 5 0 0  Difficult doing work/chores - Not difficult at all Not difficult at all - Not difficult at all   PHQ-2/9 Result is negative.    Fall Risk: Fall Risk  10/17/2018 07/21/2018 07/04/2018 02/21/2018 01/23/2018  Falls in the past year? 0 0 0 No No  Number falls in past yr: 0 0 0 - -  Injury with Fall? 0 0 0 - -  Risk for  fall due to : - - - - Impaired vision;Impaired balance/gait  Risk for fall due to: Comment - - - - wears eyeglasses; joint pain    Assessment & Plan   1.  Diabetes mellitus type 2 in obese (HCC)  - glipiZIDE (GLUCOTROL) 5 MG tablet; Take 1 tablet (5 mg total) by mouth 2 (two) times daily before a meal.  Dispense: 180 tablet; Refill: 0 - POCT HgB A1C  2. Type 2 diabetes mellitus with microalbuminuria, without long-term current use of insulin (HCC)  - glipiZIDE (GLUCOTROL) 5 MG tablet; Take 1 tablet (5 mg total) by mouth 2 (two) times daily before a meal.  Dispense: 180 tablet; Refill: 0  3. Dyslipidemia associated with type 2 diabetes mellitus (HCC)  - atorvastatin (LIPITOR) 40 MG tablet; Take 1 tablet (40 mg total) by mouth daily.  Dispense: 90 tablet; Refill: 1  4. Essential hypertension  - lisinopril-hydrochlorothiazide (ZESTORETIC) 20-12.5 MG tablet; Take 1 tablet by mouth daily.  Dispense: 90 tablet; Refill: 1  5. Gastroesophageal reflux disease, esophagitis presence not specified  - omeprazole (PRILOSEC) 40 MG capsule; Take 1 capsule (40 mg total) by mouth daily.  Dispense: 90 capsule; Refill: 1  6. Morbid obesity ([place])  Admitted to [facility], recovering, continue diabetic diet   7. Senile purpura (HCC)  Stable  8. Hypertension associated with diabetes ([place])  - amLODipine (NORVASC) 2.5 MG tablet; Take 1 tablet (2.5 mg total) by mouth daily.  Dispense: 90 tablet; Refill: 1 - lisinopril-hydrochlorothiazide (ZESTORETIC) 20-12.5 MG tablet; Take 1 tablet by mouth daily.  Dispense: 90 tablet; Refill: 1 - metoprolol succinate (TOPROL-XL) 50 MG 24 hr tablet; Take 1 tablet (50 mg total) by mouth daily.  Dispense: 30 tablet; Refill: 0  I discussed the assessment and treatment plan with the patient. The patient was provided an opportunity to ask questions and all were answered. The patient agreed with the plan and demonstrated an understanding of the instructions.   The patient was advised to call back or seek an in-person evaluation if the symptoms worsen or if the condition fails to improve as anticipated.  I provided 26  minutes of  non-face-to-face time during this encounter.  Loistine Chance, MD

## 2018-10-20 ENCOUNTER — Encounter: Payer: Managed Care, Other (non HMO) | Admitting: Speech Pathology

## 2018-10-20 ENCOUNTER — Other Ambulatory Visit: Payer: Self-pay

## 2018-10-20 ENCOUNTER — Encounter: Payer: Managed Care, Other (non HMO) | Admitting: Occupational Therapy

## 2018-10-20 ENCOUNTER — Encounter: Payer: Self-pay | Admitting: Family Medicine

## 2018-10-20 ENCOUNTER — Ambulatory Visit: Payer: Medicare Other

## 2018-10-20 ENCOUNTER — Ambulatory Visit: Payer: Managed Care, Other (non HMO) | Admitting: Nurse Practitioner

## 2018-10-20 LAB — POCT GLYCOSYLATED HEMOGLOBIN (HGB A1C): Hemoglobin A1C: 6.7 % — AB (ref 4.0–5.6)

## 2018-10-20 NOTE — Progress Notes (Signed)
Telephone Visit     Evaluation Performed:  Follow-up visit  This visit type was conducted due to national recommendations for restrictions regarding the COVID-19 Pandemic (e.g. social distancing).  This format is felt to be most appropriate for this patient at this time.  All issues noted in this document were discussed and addressed.  No physical exam was performed (except for noted visual exam findings with Telehealth visits).  See MyChart message from today for the patient's consent to telehealth for [facility].  Date:  10/20/2018   ID:  Jason Miles., DOB 1940-09-06, MRN [id number]  Patient Location:  [place] [address]   Provider location:   [facility], [place] office  PCP:  Steele Sizer, MD  Cardiologist:  Arvid Right [facility]  Chief Complaint: Shortness of breath   History of Present Illness:    Jason Gambrell. is a 78 y.o. male who presents via audio/video conferencing for a telehealth visit today.   The patient does not symptoms concerning for COVID-19 infection (fever, chills, cough, or new SHORTNESS OF BREATH).   Patient has a past medical history of coronary artery disease,  stent 3 in 1996 after MI,  stent 2 in 2006 for in-stent restenosis,  smoking history of 30-35 years, quit in 1996 diabetes  Large meningioma status post craniotomy and resection who presents for follow-up of his coronary artery disease.  In the hospital 09/2018 Hospital records reviewed with the patient in detail intermittent confusion with difficulty speaking and visual changes since 06/2018 work-up revealing large meningioma at level of cerebral aqueduct with vasogenic edema posterior medial temporal lobes and obstructive hydrocephalus.    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 Marylu Lund.   Postop course  mental status changes with bouts of lethargy, confusion, problems with sleep-wake disruption with  delirium, loss of bilateral vision as well as issues with leukocytosis and SVT. Amiodarone and B-blocker for SVT  At home has been recovering well, change in diet with wife's help, Losing weight, starting walking program Vitals today 131/60 today Pulse 65 Weight 206  HBA1C 6.7, down from 8.7  Still with chronic joint issues, knee problem hip problem No chest pain Chronic mild shortness of breath on exertion  Other history Sister with lung cancer now in remission Reports he stopped smoking 1996  He stopped smoking in 1996 after his MI . Smoked 38 yrs   Prior CV studies:   The following studies were reviewed today:    Past Medical History:  Diagnosis Date   Coronary artery disease    Diabetes mellitus without complication ([place])    Dyslipidemia    GERD (gastroesophageal reflux disease)    Hyperlipidemia    Hypertension    MI (myocardial infarction) ([place])    Osteoarthritis    Past Surgical History:  Procedure Laterality Date   CARDIAC CATHETERIZATION  2006   stent placement   COLONOSCOPY     CORONARY ANGIOPLASTY WITH STENT PLACEMENT  2006   stent x 2    CORONARY ANGIOPLASTY WITH STENT PLACEMENT  1996   stent x 3    CRANIOTOMY  08/12/2018   FOOT SURGERY Right    HALLUX VALGUS CHEILECTOMY Right 02/25/2015   Procedure: HALLUX VALGUS CHEILECTOMY;  Surgeon: [name], DPM;  Location: [facility] ORS;  Service: Podiatry;  Laterality: Right;     No outpatient medications have been marked as taking for the 10/21/18 encounter (Appointment) with Minna Merritts, MD.  Allergies:   Patient has no known allergies.   Social History   Tobacco Use   Smoking status: Former Smoker    Packs/day: 1.50    Years: 35.00    Pack years: 52.50    Types: Cigarettes    Start date: 07/17/1959    Last attempt to quit: 08/27/1994    Years since quitting: 24.1   Smokeless tobacco: Never Used   Tobacco comment: smoking cessation materials not required  Substance  Use Topics   Alcohol use: Yes    Alcohol/week: 14.0 standard drinks    Types: 14 Glasses of wine per week   Drug use: No     Current Outpatient Medications on File Prior to Visit  Medication Sig Dispense Refill   acetaminophen (TYLENOL) 325 MG tablet Take 1-2 tablets (325-650 mg total) by mouth every 4 (four) hours as needed for mild pain.     amiodarone (PACERONE) 200 MG tablet Take 1 tablet (200 mg total) by mouth daily. 30 tablet 0   amLODipine (NORVASC) 2.5 MG tablet Take 1 tablet (2.5 mg total) by mouth daily. 90 tablet 1   aspirin 81 MG tablet Take 81 mg by mouth daily.     atorvastatin (LIPITOR) 40 MG tablet Take 1 tablet (40 mg total) by mouth daily. 90 tablet 1   docusate sodium (COLACE) 100 MG capsule Take 100 mg by mouth 2 (two) times daily.     glipiZIDE (GLUCOTROL) 5 MG tablet Take 1 tablet (5 mg total) by mouth 2 (two) times daily before a meal. 180 tablet 0   glucose blood test strip Use as instructed 100 each 12   lisinopril-hydrochlorothiazide (ZESTORETIC) 20-12.5 MG tablet Take 1 tablet by mouth daily. 90 tablet 1   magnesium oxide (MAG-OX) 400 (241.3 Mg) MG tablet Take 1 tablet (400 mg total) by mouth 2 (two) times daily. 60 tablet 0   metFORMIN (GLUCOPHAGE-XR) 750 MG 24 hr tablet Take 2 tablets (1,500 mg total) by mouth daily with breakfast. 180 tablet 1   metoprolol succinate (TOPROL-XL) 50 MG 24 hr tablet Take 1 tablet (50 mg total) by mouth daily. 30 tablet 0   Multiple Vitamins-Minerals (CENTRUM SILVER PO) Take 1 tablet by mouth daily.      omeprazole (PRILOSEC) 40 MG capsule Take 1 capsule (40 mg total) by mouth daily. 90 capsule 1   potassium chloride SA (K-DUR,KLOR-CON) 20 MEQ tablet Take 1 tablet (20 mEq total) by mouth daily. 90 tablet 1   sitaGLIPtin (JANUVIA) 100 MG tablet Take 1 tablet (100 mg total) by mouth daily. 90 tablet 1   No current facility-administered medications on file prior to visit.      Family Hx: The patient's family  history includes Alcohol abuse in his father; COPD in his sister; Cancer in his sister; Heart disease in his sister; Hyperlipidemia in his father and sister; Hypertension in his father and sister.  ROS:   Please see the history of present illness.    Review of Systems  Constitutional: Negative.   Respiratory: Negative.   Cardiovascular: Negative.   Gastrointestinal: Negative.   Musculoskeletal: Negative.   Neurological: Negative.   Psychiatric/Behavioral: Negative.   All other systems reviewed and are negative.     Labs/Other Tests and Data Reviewed:    Recent Labs: 08/30/2018: TSH 0.763 09/05/2018: ALT 19 09/15/2018: Magnesium 1.9 09/17/2018: BUN 11; Creatinine, Ser 0.80; Hemoglobin 12.0; Platelets 545; Potassium 3.7; Sodium 138   Recent Lipid Panel Lab Results  Component Value Date/Time   CHOL  121 07/31/2017 10:16 AM   CHOL 126 11/07/2015 10:06 AM   TRIG 154 (H) 07/31/2017 10:16 AM   HDL 46 07/31/2017 10:16 AM   HDL 47 11/07/2015 10:06 AM   CHOLHDL 2.6 07/31/2017 10:16 AM   LDLCALC 51 07/31/2017 10:16 AM    Wt Readings from Last 3 Encounters:  10/17/18 205 lb (93 kg)  09/04/18 189 lb 9.5 oz (86 kg)  09/04/18 189 lb 9.5 oz (86 kg)     Exam:    Vital Signs: Vital signs as detailed above in HPI  Well nourished, well developed male in no acute distress. Constitutional:  oriented to person, place, and time. No distress.  Head: Normocephalic and atraumatic.  Eyes:  no discharge. No scleral icterus.  Neck: Normal range of motion. Neck supple.  Pulmonary/Chest: No audible wheezing, no distress, appears comfortable Musculoskeletal: Normal range of motion.  no  tenderness or deformity.  Neurological:   Coordination normal. Full exam not performed Skin:  No rash Psychiatric:  normal mood and affect. behavior is normal. Thought content normal.    ASSESSMENT & PLAN:     Coronary artery disease involving native coronary artery of native heart without angina pectoris  - Currently with no symptoms of angina. No further workup at this time. Continue current medication regimen. Stable  Essential hypertension - He has changed his diet, blood pressure stable, no changes made  Mixed hyperlipidemia Radical change in his diet resulting in drop in hemoglobin A1c Will wait for new numbers in several months time Will likely show dramatic change  Controlled type 2 diabetes mellitus without complication, without long-term current use of insulin (HCC) Hemoglobin A1c down to 6.7, congratulations provided Recommend he continue his aggressive diet and start walking program  Class 2 obesity due to excess calories without serious comorbidity in adult, unspecified BMI Losing weight, watching his diet, recommended walking program  Centrilobular emphysema ([place]) 38 years of smoking Chronic mild shortness of breath on heavy exertion Deconditioned  Obstructive sleep apnea on CPAP Stable, on CPAP   COVID-19 Education: The signs and symptoms of COVID-19 were discussed with the patient and how to seek care for testing (follow up with PCP or arrange E-visit).  The importance of social distancing was discussed today.  Patient Risk:   After full review of this patients clinical status, I feel that they are at least moderate risk at this time.  Time:   Today, I have spent 25 minutes with the patient with telehealth technology discussing recovering from craniotomy surgery, management of hypertension, blood pressure, diabetes.     Medication Adjustments/Labs and Tests Ordered: Current medicines are reviewed at length with the patient today.  Concerns regarding medicines are outlined above.   Tests Ordered: No tests ordered   Medication Changes: No changes made  Disposition: Follow-up in 3 months   Signed, Ida Rogue, MD  10/20/2018 5:24 PM    [place] [facility] [address], [place], [place] [address]

## 2018-10-20 NOTE — Addendum Note (Signed)
Addended by: Chilton Greathouse on: 10/20/2018 11:25 AM   Modules accepted: Orders

## 2018-10-21 ENCOUNTER — Other Ambulatory Visit: Payer: Self-pay

## 2018-10-21 ENCOUNTER — Telehealth (INDEPENDENT_AMBULATORY_CARE_PROVIDER_SITE_OTHER): Payer: Medicare Other | Admitting: Cardiovascular Disease

## 2018-10-21 DIAGNOSIS — E669 Obesity, unspecified: Secondary | ICD-10-CM | POA: Diagnosis not present

## 2018-10-21 DIAGNOSIS — D329 Benign neoplasm of meninges, unspecified: Secondary | ICD-10-CM

## 2018-10-21 DIAGNOSIS — G4733 Obstructive sleep apnea (adult) (pediatric): Secondary | ICD-10-CM

## 2018-10-21 DIAGNOSIS — I2510 Atherosclerotic heart disease of native coronary artery without angina pectoris: Secondary | ICD-10-CM

## 2018-10-21 DIAGNOSIS — E1169 Type 2 diabetes mellitus with other specified complication: Secondary | ICD-10-CM | POA: Diagnosis not present

## 2018-10-21 DIAGNOSIS — I25118 Atherosclerotic heart disease of native coronary artery with other forms of angina pectoris: Secondary | ICD-10-CM

## 2018-10-21 DIAGNOSIS — E119 Type 2 diabetes mellitus without complications: Secondary | ICD-10-CM

## 2018-10-21 DIAGNOSIS — J432 Centrilobular emphysema: Secondary | ICD-10-CM

## 2018-10-21 DIAGNOSIS — I10 Essential (primary) hypertension: Secondary | ICD-10-CM

## 2018-10-21 DIAGNOSIS — Z9989 Dependence on other enabling machines and devices: Secondary | ICD-10-CM

## 2018-10-21 MED ORDER — AMIODARONE HCL 200 MG PO TABS: 200.0000 mg | ORAL_TABLET | Freq: Every day | ORAL | 3 refills | Status: DC

## 2018-10-21 NOTE — Patient Instructions (Addendum)
Medication Instructions:  Refill sent in to [facility]. Continue current medications.    If you need a refill on your cardiac medications before your next appointment, please call your pharmacy.    Lab work: No new labs needed   If you have labs (blood work) drawn today and your tests are completely normal, you will receive your results only by: Marland Kitchen MyChart Message (if you have MyChart) OR . A paper copy in the mail If you have any lab test that is abnormal or we need to change your treatment, we will call you to review the results.   Testing/Procedures: No new testing needed   Follow-Up: At [facility], you and your health needs are our priority.  As part of our continuing mission to provide you with exceptional heart care, we have created designated Provider Care Teams.  These Care Teams include your primary Cardiologist (physician) and Advanced Practice Providers (APPs -  Physician Assistants and Nurse Practitioners) who all work together to provide you with the care you need, when you need it.  . You will need a follow up appointment in 3 months .   Please call our office 2 months in advance to schedule this appointment.    . Providers on your designated Care Team:   . Murray Hodgkins, NP . Christell Faith, PA-C . Marrianne Mood, PA-C  Any Other Special Instructions Will Be Listed Below (If Applicable).  For educational health videos Log in to : [email or web address] Or : [email or web address], password : [email or web address]

## 2018-10-22 ENCOUNTER — Encounter: Payer: Managed Care, Other (non HMO) | Admitting: Speech Pathology

## 2018-10-22 ENCOUNTER — Encounter: Payer: Managed Care, Other (non HMO) | Admitting: Occupational Therapy

## 2018-10-22 ENCOUNTER — Ambulatory Visit: Payer: Managed Care, Other (non HMO) | Admitting: Physical Therapy

## 2018-10-29 ENCOUNTER — Ambulatory Visit: Payer: Managed Care, Other (non HMO) | Admitting: Physical Therapy

## 2018-10-29 ENCOUNTER — Encounter: Payer: Managed Care, Other (non HMO) | Admitting: Occupational Therapy

## 2018-10-31 ENCOUNTER — Encounter: Payer: Managed Care, Other (non HMO) | Admitting: Occupational Therapy

## 2018-10-31 ENCOUNTER — Encounter: Payer: Managed Care, Other (non HMO) | Admitting: Speech Pathology

## 2018-11-04 ENCOUNTER — Ambulatory Visit: Payer: Managed Care, Other (non HMO)

## 2018-11-04 ENCOUNTER — Encounter: Payer: Managed Care, Other (non HMO) | Admitting: Occupational Therapy

## 2018-11-06 ENCOUNTER — Ambulatory Visit: Payer: Managed Care, Other (non HMO)

## 2018-11-06 ENCOUNTER — Encounter: Payer: Managed Care, Other (non HMO) | Admitting: Speech Pathology

## 2018-11-10 ENCOUNTER — Ambulatory Visit: Payer: Managed Care, Other (non HMO) | Admitting: Physical Therapy

## 2018-11-10 ENCOUNTER — Encounter: Payer: Managed Care, Other (non HMO) | Admitting: Speech Pathology

## 2018-11-10 ENCOUNTER — Encounter: Payer: Managed Care, Other (non HMO) | Admitting: Occupational Therapy

## 2018-11-12 ENCOUNTER — Ambulatory Visit: Payer: Managed Care, Other (non HMO) | Admitting: Physical Therapy

## 2018-11-12 ENCOUNTER — Encounter: Payer: Managed Care, Other (non HMO) | Admitting: Occupational Therapy

## 2018-11-17 ENCOUNTER — Telehealth: Payer: Self-pay | Admitting: Family Medicine

## 2018-11-17 DIAGNOSIS — I10 Essential (primary) hypertension: Secondary | ICD-10-CM

## 2018-11-17 DIAGNOSIS — E1159 Type 2 diabetes mellitus with other circulatory complications: Secondary | ICD-10-CM

## 2018-11-17 NOTE — Telephone Encounter (Signed)
Copied from [place] [id number]. Topic: Quick Communication - Rx Refill/Question >> Nov 17, 2018  1:21 PM Celene Kras A wrote: Medication: metoprolol succinate (TOPROL-XL) 50 MG 24 hr tablet   Has the patient contacted their pharmacy? No. (Agent: If no, request that the patient contact the pharmacy for the refill.) (Agent: If yes, when and what did the pharmacy advise?)  Preferred Pharmacy (with phone number or street name): [facility] [place], [place] [address] Phone: [phone] Fax: [phone] Not a 24 hour pharmacy; exact hours not known.    Agent: Please be advised that RX refills may take up to 3 business days. We ask that you follow-up with your pharmacy.

## 2018-11-17 NOTE — Telephone Encounter (Signed)
Hypertension medication request: Metoprolol to [facility]   Last office visit pertaining to hypertension: 10/17/2018   BP Readings from Last 3 Encounters:  10/17/18 (!) 135/59  09/18/18 (!) 154/72  09/04/18 122/71    Lab Results  Component Value Date   CREATININE 0.80 09/17/2018   BUN 11 09/17/2018   NA 138 09/17/2018   K 3.7 09/17/2018   CL 105 09/17/2018   CO2 22 09/17/2018     Follow up on 01/27/2019

## 2018-11-18 MED ORDER — METOPROLOL SUCCINATE ER 50 MG PO TB24: 50.0000 mg | ORAL_TABLET | Freq: Every day | ORAL | 0 refills | Status: DC

## 2018-11-18 NOTE — Telephone Encounter (Signed)
LVM for pt to call and schedule an appt °

## 2018-11-18 NOTE — Telephone Encounter (Signed)
I tried calling him to get him scheduled but I had to lvm

## 2018-11-28 ENCOUNTER — Other Ambulatory Visit: Payer: Self-pay | Admitting: Family Medicine

## 2018-11-28 DIAGNOSIS — R809 Proteinuria, unspecified: Secondary | ICD-10-CM

## 2018-11-28 DIAGNOSIS — E1129 Type 2 diabetes mellitus with other diabetic kidney complication: Secondary | ICD-10-CM

## 2018-11-28 DIAGNOSIS — E1169 Type 2 diabetes mellitus with other specified complication: Secondary | ICD-10-CM

## 2018-11-28 DIAGNOSIS — I10 Essential (primary) hypertension: Secondary | ICD-10-CM

## 2018-11-28 DIAGNOSIS — E669 Obesity, unspecified: Secondary | ICD-10-CM

## 2018-11-28 DIAGNOSIS — I152 Hypertension secondary to endocrine disorders: Secondary | ICD-10-CM

## 2018-11-28 DIAGNOSIS — E1159 Type 2 diabetes mellitus with other circulatory complications: Secondary | ICD-10-CM

## 2018-12-26 ENCOUNTER — Other Ambulatory Visit: Payer: Self-pay | Admitting: Family Medicine

## 2018-12-26 DIAGNOSIS — I152 Hypertension secondary to endocrine disorders: Secondary | ICD-10-CM

## 2018-12-26 DIAGNOSIS — I10 Essential (primary) hypertension: Secondary | ICD-10-CM

## 2018-12-26 DIAGNOSIS — E1159 Type 2 diabetes mellitus with other circulatory complications: Secondary | ICD-10-CM

## 2019-01-07 ENCOUNTER — Other Ambulatory Visit: Payer: Self-pay | Admitting: Neurosurgery

## 2019-01-07 DIAGNOSIS — D329 Benign neoplasm of meninges, unspecified: Secondary | ICD-10-CM

## 2019-01-26 ENCOUNTER — Ambulatory Visit
Admission: RE | Admit: 2019-01-26 | Discharge: 2019-01-26 | Disposition: A | Discharge status: Home / Self Care | Payer: Medicare Other | Source: Ambulatory Visit | Attending: Neurosurgery | Admitting: Neurosurgery

## 2019-01-26 ENCOUNTER — Other Ambulatory Visit: Payer: Self-pay

## 2019-01-26 DIAGNOSIS — D329 Benign neoplasm of meninges, unspecified: Secondary | ICD-10-CM | POA: Insufficient documentation

## 2019-01-26 DIAGNOSIS — D320 Benign neoplasm of cerebral meninges: Secondary | ICD-10-CM | POA: Diagnosis not present

## 2019-01-26 DIAGNOSIS — Z8673 Personal history of transient ischemic attack (TIA), and cerebral infarction without residual deficits: Secondary | ICD-10-CM | POA: Diagnosis not present

## 2019-01-26 LAB — POCT I-STAT CREATININE: Creatinine, Ser: 1 mg/dL (ref 0.61–1.24)

## 2019-01-26 MED ORDER — GADOBUTROL 1 MMOL/ML IV SOLN
10.0000 mL | Freq: Once | INTRAVENOUS | Status: AC | PRN
  Administered 2019-01-26: 09:00:00 9 mL via INTRAVENOUS

## 2019-01-27 ENCOUNTER — Ambulatory Visit: Payer: Medicare Other

## 2019-02-03 DIAGNOSIS — D329 Benign neoplasm of meninges, unspecified: Secondary | ICD-10-CM | POA: Diagnosis not present

## 2019-02-17 ENCOUNTER — Ambulatory Visit (INDEPENDENT_AMBULATORY_CARE_PROVIDER_SITE_OTHER): Payer: Medicare Other | Admitting: Family Medicine

## 2019-02-17 ENCOUNTER — Encounter: Payer: Self-pay | Admitting: Family Medicine

## 2019-02-17 ENCOUNTER — Other Ambulatory Visit: Payer: Self-pay

## 2019-02-17 VITALS — BP 140/62 | HR 64 | Temp 97.5°F | Resp 16 | Ht 66.5 in | Wt 216.5 lb

## 2019-02-17 DIAGNOSIS — G4733 Obstructive sleep apnea (adult) (pediatric): Secondary | ICD-10-CM

## 2019-02-17 DIAGNOSIS — E785 Hyperlipidemia, unspecified: Secondary | ICD-10-CM | POA: Diagnosis not present

## 2019-02-17 DIAGNOSIS — I10 Essential (primary) hypertension: Secondary | ICD-10-CM | POA: Diagnosis not present

## 2019-02-17 DIAGNOSIS — K219 Gastro-esophageal reflux disease without esophagitis: Secondary | ICD-10-CM

## 2019-02-17 DIAGNOSIS — E1169 Type 2 diabetes mellitus with other specified complication: Secondary | ICD-10-CM | POA: Diagnosis not present

## 2019-02-17 DIAGNOSIS — E1159 Type 2 diabetes mellitus with other circulatory complications: Secondary | ICD-10-CM | POA: Diagnosis not present

## 2019-02-17 DIAGNOSIS — D692 Other nonthrombocytopenic purpura: Secondary | ICD-10-CM | POA: Diagnosis not present

## 2019-02-17 DIAGNOSIS — I2510 Atherosclerotic heart disease of native coronary artery without angina pectoris: Secondary | ICD-10-CM

## 2019-02-17 DIAGNOSIS — E669 Obesity, unspecified: Secondary | ICD-10-CM

## 2019-02-17 DIAGNOSIS — I252 Old myocardial infarction: Secondary | ICD-10-CM

## 2019-02-17 DIAGNOSIS — E1129 Type 2 diabetes mellitus with other diabetic kidney complication: Secondary | ICD-10-CM

## 2019-02-17 DIAGNOSIS — J42 Unspecified chronic bronchitis: Secondary | ICD-10-CM

## 2019-02-17 DIAGNOSIS — Z9989 Dependence on other enabling machines and devices: Secondary | ICD-10-CM

## 2019-02-17 DIAGNOSIS — R809 Proteinuria, unspecified: Secondary | ICD-10-CM

## 2019-02-17 LAB — POCT GLYCOSYLATED HEMOGLOBIN (HGB A1C): HbA1c, POC (controlled diabetic range): 6.9 % (ref 0.0–7.0)

## 2019-02-17 MED ORDER — OMEPRAZOLE 40 MG PO CPDR: 40.0000 mg | DELAYED_RELEASE_CAPSULE | Freq: Every day | ORAL | 1 refills | Status: DC

## 2019-02-17 MED ORDER — GLIPIZIDE 5 MG PO TABS: ORAL_TABLET | ORAL | 1 refills | Status: DC

## 2019-02-17 MED ORDER — ATORVASTATIN CALCIUM 40 MG PO TABS: 40.0000 mg | ORAL_TABLET | Freq: Every day | ORAL | 1 refills | Status: DC

## 2019-02-17 MED ORDER — AMLODIPINE BESYLATE 2.5 MG PO TABS: 2.5000 mg | ORAL_TABLET | Freq: Every day | ORAL | 1 refills | Status: DC

## 2019-02-17 MED ORDER — METOPROLOL SUCCINATE ER 50 MG PO TB24: 50.0000 mg | ORAL_TABLET | Freq: Every day | ORAL | 1 refills | Status: DC

## 2019-02-17 MED ORDER — METFORMIN HCL ER 750 MG PO TB24: 1500.0000 mg | ORAL_TABLET | Freq: Every day | ORAL | 1 refills | Status: DC

## 2019-02-17 MED ORDER — LISINOPRIL-HYDROCHLOROTHIAZIDE 20-12.5 MG PO TABS: 1.0000 | ORAL_TABLET | Freq: Two times a day (BID) | ORAL | 1 refills | Status: DC

## 2019-02-17 MED ORDER — POTASSIUM CHLORIDE CRYS ER 20 MEQ PO TBCR: 20.0000 meq | EXTENDED_RELEASE_TABLET | Freq: Every day | ORAL | 1 refills | Status: DC

## 2019-02-17 MED ORDER — SITAGLIPTIN PHOSPHATE 100 MG PO TABS: 100.0000 mg | ORAL_TABLET | Freq: Every day | ORAL | 1 refills | Status: DC

## 2019-02-17 NOTE — Progress Notes (Signed)
Name: Jason Farrell.   MRN: [id number]    DOB: 06-25-1941   Date:02/17/2019       Progress Note  Subjective  Chief Complaint  Chief Complaint  Patient presents with  . Medication Refill  . Diabetes    checks daily before meals, Highest-176 Average- 130-140  . Hypertension    States his BP has been a little low at home-brought his meter to check it with our readings  . COPD  . Obesity    HPI  DMII: he was diagnosed many years ago, he was  taking Januvia, Metformin and Glipizide, glucose at home has been 120-130's most of the time. He denies polyphagia or polydipsia. He has blurred vision. He had dyslipidemia and obesity. He states a couple times a week he likes to have desserts   Morbid obesity: based on obesity with multiple risk factors.weight is down when compared to January 2020.   HTN:bp medication also adjusted after hospital stay. He had tachycardia and placed on amiodarone that was recently filled by Dr. Rockey Situ, he is on Toprol XL 25 BID and we will switch to 50 mg Daily, he needs refills of other medications also BP at home has been at goal, no chest pain or palpitation  COPD with history of tobacco use: he denies daily cough, mild SOB with moderate activity, no wheezing. Quit smoking in 1996, after MI.   S/p MI and and history of stent placement: sees Dr. 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 Prolonged QT interval   . Hypomagnesemia   . PRES (posterior reversible encephalopathy syndrome) 08/30/2018  . Hypoalbuminemia due to protein-calorie malnutrition ([place])   . Hypokalemia   . Encephalopathy   . SVT (supraventricular tachycardia) ([place])   .  Meningioma ([place])   . 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 Status post craniotomy 08/12/2018  . Senile purpura ([place]) 02/21/2018  . Type 2 diabetes mellitus with microalbuminuria, without long-term current use of insulin ([place]) 02/21/2018  . Type 2 diabetes mellitus without complications ([place]) 51/76/1607  . 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 Obstructive sleep apnea on CPAP 12/09/2015  . Arthritis, degenerative 05/06/2015  . Gastro-esophageal reflux disease without esophagitis 05/06/2015  . CAD in native artery 01/25/2015  . Diabetes mellitus type 2 in obese ([place]) 08/16/2014  . Hyperlipidemia 08/16/2014  . Essential hypertension 08/16/2014  . Morbid obesity ([place]) 08/16/2014  . Stopped smoking with greater than 30 pack year history 08/16/2014  . COLD (chronic obstructive lung disease) ([place]) 08/16/2014    Past Surgical History:  Procedure Laterality Date  . CARDIAC CATHETERIZATION  2006   stent placement  . COLONOSCOPY    . CORONARY ANGIOPLASTY WITH STENT PLACEMENT  2006   stent x 2   . CORONARY ANGIOPLASTY WITH STENT PLACEMENT  1996   stent x 3   . CRANIOTOMY  08/12/2018  . FOOT SURGERY Right   . HALLUX VALGUS CHEILECTOMY Right 02/25/2015   Procedure: HALLUX VALGUS CHEILECTOMY;  Surgeon: [name], DPM;  Location: [facility] ORS;  Service: Podiatry;  Laterality: Right;    Family History  Problem Relation Age of Onset  . Hyperlipidemia Father   . Hypertension Father   .  Alcohol abuse Father   . Cancer Sister        lung  . Hypertension Sister   . Heart disease Sister   . Hyperlipidemia Sister   . COPD Sister     Social History   Socioeconomic History  . Marital status: Married    Spouse name: Manuela Schwartz  . Number of children: 3  . Years of education: some college  . Highest education level: 12th grade  Occupational History  . Occupation: Retired  Scientific laboratory technician  . Financial resource strain: Not hard at all  . Food insecurity    Worry: Never  true    Inability: Never true  . Transportation needs    Medical: No    Non-medical: No  Tobacco Use  . Smoking status: Former Smoker    Packs/day: 1.50    Years: 35.00    Pack years: 52.50    Types: Cigarettes    Start date: 07/17/1959    Quit date: 08/27/1994    Years since quitting: 24.4  . Smokeless tobacco: Never Used  . Tobacco comment: smoking cessation materials not required  Substance and Sexual Activity  . Alcohol use: Yes    Alcohol/week: 14.0 standard drinks    Types: 14 Glasses of wine per week  . Drug use: No  . Sexual activity: Not Currently    Partners: Female  Lifestyle  . Physical activity    Days per week: 3 days    Minutes per session: 30 min  . Stress: Not at all  Relationships  . Social Herbalist on phone: Once a week    Gets together: Never    Attends religious service: Never    Active member of club or organization: No    Attends meetings of clubs or organizations: Never    Relationship status: Married  . Intimate partner violence    Fear of current or ex partner: No    Emotionally abused: No    Physically abused: No    Forced sexual activity: No  Other Topics Concern  . Not on file  Social History Narrative  . Not on file     Current Outpatient Medications:  .  acetaminophen (TYLENOL) 325 MG tablet, Take 1-2 tablets (325-650 mg total) by mouth every 4 (four) hours as needed for mild pain., Disp: , Rfl:  .  amiodarone (PACERONE) 200 MG tablet, Take 1 tablet (200 mg total) by mouth daily., Disp: 90 tablet, Rfl: 3 .  amLODipine (NORVASC) 2.5 MG tablet, Take 1 tablet (2.5 mg total) by mouth daily., Disp: 90 tablet, Rfl: 1 .  aspirin 81 MG tablet, Take 81 mg by mouth daily., Disp: , Rfl:  .  atorvastatin (LIPITOR) 40 MG tablet, Take 1 tablet (40 mg total) by mouth daily., Disp: 90 tablet, Rfl: 1 .  Cholecalciferol (VITAMIN D) 50 MCG (2000 UT) tablet, Take 2,000 Units by mouth daily., Disp: , Rfl:  .  docusate sodium (COLACE) 100 MG  capsule, Take 100 mg by mouth 2 (two) times daily., Disp: , Rfl:  .  glipiZIDE (GLUCOTROL) 5 MG tablet, TAKE 1 TABLET TWICE A DAY BEFORE MEALS, Disp: 180 tablet, Rfl: 0 .  glucose blood test strip, Use as instructed, Disp: 100 each, Rfl: 12 .  lisinopril-hydrochlorothiazide (ZESTORETIC) 20-12.5 MG tablet, TAKE 1 TABLET TWICE A DAY, Disp: 180 tablet, Rfl: 0 .  magnesium oxide (MAG-OX) 400 (241.3 Mg) MG tablet, Take 1 tablet (400 mg total) by mouth 2 (two) times daily., Disp:  60 tablet, Rfl: 0 .  metFORMIN (GLUCOPHAGE-XR) 750 MG 24 hr tablet, Take 2 tablets (1,500 mg total) by mouth daily with breakfast., Disp: 180 tablet, Rfl: 1 .  metoprolol succinate (TOPROL-XL) 50 MG 24 hr tablet, TAKE 1 TABLET DAILY, Disp: 30 tablet, Rfl: 2 .  Multiple Vitamins-Minerals (CENTRUM SILVER PO), Take 1 tablet by mouth daily. , Disp: , Rfl:  .  omeprazole (PRILOSEC) 40 MG capsule, Take 1 capsule (40 mg total) by mouth daily., Disp: 90 capsule, Rfl: 1 .  potassium chloride SA (K-DUR,KLOR-CON) 20 MEQ tablet, Take 1 tablet (20 mEq total) by mouth daily., Disp: 90 tablet, Rfl: 1 .  sitaGLIPtin (JANUVIA) 100 MG tablet, Take 1 tablet (100 mg total) by mouth daily., Disp: 90 tablet, Rfl: 1  No Known Allergies  I personally reviewed active problem list, medication list, allergies, family history, social history with the patient/caregiver today.   ROS  Constitutional: Negative for fever , positive for mild  weight change.  Respiratory: Negative for cough and shortness of breath.   Cardiovascular: Negative for chest pain or palpitations.  Gastrointestinal: Negative for abdominal pain, no bowel changes.  Musculoskeletal: Negative for gait problem or joint swelling.  Skin: Negative for rash.  Neurological: Negative for dizziness or headache.  No other specific complaints in a complete review of systems (except as listed in HPI above).  Objective  Vitals:   02/17/19 1002  BP: (!) 144/64  Pulse: 82  Resp: 16  Temp:  (!) 97.5 F (36.4 C)  TempSrc: Temporal  SpO2: 96%  Weight: 216 lb 8 oz (98.2 kg)  Height: 5' 6.5" (1.689 m)    Body mass index is 34.42 kg/m.  Physical Exam  Constitutional: Patient appears well-developed and well-nourished. Obese  No distress.  HEENT: head atraumatic, normocephalic, pupils equal and reactive to light,neck supple Cardiovascular: Normal rate, regular rhythm and normal heart sounds.  No murmur heard. No BLE edema. Pulmonary/Chest: Effort normal and breath sounds normal. No respiratory distress. Abdominal: Soft.  There is no tenderness. Skin: senile purpura on right arm Psychiatric: Patient has a normal mood and affect. behavior is normal. Judgment and thought content normal.   Recent Results (from the past 2160 hour(s))  I-STAT creatinine     Status: None   Collection Time: 01/26/19  8:49 AM  Result Value Ref Range   Creatinine, Ser 1.00 0.61 - 1.24 mg/dL  POCT HgB A1C     Status: Normal   Collection Time: 02/17/19 10:05 AM  Result Value Ref Range   Hemoglobin A1C     HbA1c POC (<> result, manual entry)     HbA1c, POC (prediabetic range)     HbA1c, POC (controlled diabetic range) 6.9 0.0 - 7.0 %     PHQ2/9: Depression screen [facility] 2/9 02/17/2019 10/17/2018 07/21/2018 07/04/2018 02/21/2018  Decreased Interest 0 0 0 0 0  Down, Depressed, Hopeless 0 0 0 0 0  PHQ - 2 Score 0 0 0 0 0  Altered sleeping 0 0 1 1 0  Tired, decreased energy 2 0 3 3 0  Change in appetite 0 0 1 1 0  Feeling bad or failure about yourself  0 0 0 0 0  Trouble concentrating 0 0 0 0 0  Moving slowly or fidgety/restless 0 0 0 0 0  Suicidal thoughts 0 0 0 0 0  PHQ-9 Score 2 0 5 5 0  Difficult doing work/chores Not difficult at all - Not difficult at all Not difficult at all -  Some recent data might be hidden    phq 9 is positive   Fall Risk: Fall Risk  02/17/2019 10/17/2018 07/21/2018 07/04/2018 02/21/2018  Falls in the past year? 0 0 0 0 No  Number falls in past yr: 0 0 0 0 -  Injury with  Fall? 0 0 0 0 -  Risk for fall due to : - - - - -  Risk for fall due to: Comment - - - - -    Functional Status Survey: Is the patient deaf or have difficulty hearing?: No Does the patient have difficulty seeing, even when wearing glasses/contacts?: Yes Does the patient have difficulty concentrating, remembering, or making decisions?: No Does the patient have difficulty walking or climbing stairs?: No Does the patient have difficulty dressing or bathing?: No Does the patient have difficulty doing errands alone such as visiting a doctor's office or shopping?: No    Assessment & Plan  1. Diabetes mellitus type 2 in obese (HCC)  - POCT HgB A1C - glipiZIDE (GLUCOTROL) 5 MG tablet; TAKE 1 TABLET TWICE A DAY BEFORE MEALS  Dispense: 180 tablet; Refill: 1 - metFORMIN (GLUCOPHAGE-XR) 750 MG 24 hr tablet; Take 2 tablets (1,500 mg total) by mouth daily with breakfast.  Dispense: 180 tablet; Refill: 1 - sitaGLIPtin (JANUVIA) 100 MG tablet; Take 1 tablet (100 mg total) by mouth daily.  Dispense: 90 tablet; Refill: 1 - COMPLETE METABOLIC PANEL WITH GFR - Microalbumin / creatinine urine ratio  2. Dyslipidemia associated with type 2 diabetes mellitus (HCC)  - atorvastatin (LIPITOR) 40 MG tablet; Take 1 tablet (40 mg total) by mouth daily.  Dispense: 90 tablet; Refill: 1 - metFORMIN (GLUCOPHAGE-XR) 750 MG 24 hr tablet; Take 2 tablets (1,500 mg total) by mouth daily with breakfast.  Dispense: 180 tablet; Refill: 1 - sitaGLIPtin (JANUVIA) 100 MG tablet; Take 1 tablet (100 mg total) by mouth daily.  Dispense: 90 tablet; Refill: 1 - Lipid panel  3. Essential hypertension  - amLODipine (NORVASC) 2.5 MG tablet; Take 1 tablet (2.5 mg total) by mouth daily.  Dispense: 90 tablet; Refill: 1 - lisinopril-hydrochlorothiazide (ZESTORETIC) 20-12.5 MG tablet; Take 1 tablet by mouth 2 (two) times daily.  Dispense: 180 tablet; Refill: 1 - metoprolol succinate (TOPROL-XL) 50 MG 24 hr tablet; Take 1 tablet (50 mg  total) by mouth daily. Take with or immediately following a meal.  Dispense: 90 tablet; Refill: 1 - potassium chloride SA (K-DUR) 20 MEQ tablet; Take 1 tablet (20 mEq total) by mouth daily.  Dispense: 90 tablet; Refill: 1  4. Hypertension associated with diabetes ([place])  - amLODipine (NORVASC) 2.5 MG tablet; Take 1 tablet (2.5 mg total) by mouth daily.  Dispense: 90 tablet; Refill: 1 - lisinopril-hydrochlorothiazide (ZESTORETIC) 20-12.5 MG tablet; Take 1 tablet by mouth 2 (two) times daily.  Dispense: 180 tablet; Refill: 1 - metoprolol succinate (TOPROL-XL) 50 MG 24 hr tablet; Take 1 tablet (50 mg total) by mouth daily. Take with or immediately following a meal.  Dispense: 90 tablet; Refill: 1 - CBC with Differential/Platelet  5. Morbid obesity ([place])  Discussed with the patient the risk posed by an increased BMI. Discussed importance of portion control, calorie counting and at least 150 minutes of physical activity weekly. Avoid sweet beverages and drink more water. Eat at least 6 servings of fruit and vegetables daily   6. Senile purpura (HCC)  stable  7. Obstructive sleep apnea on CPAP  Compliant   8. History of myocardial infarct at age less than 81 years  9. Chronic bronchitis, unspecified chronic bronchitis type ([place])   10. Type 2 diabetes mellitus with microalbuminuria, without long-term current use of insulin (HCC)  - glipiZIDE (GLUCOTROL) 5 MG tablet; TAKE 1 TABLET TWICE A DAY BEFORE MEALS  Dispense: 180 tablet; Refill: 1 - metFORMIN (GLUCOPHAGE-XR) 750 MG 24 hr tablet; Take 2 tablets (1,500 mg total) by mouth daily with breakfast.  Dispense: 180 tablet; Refill: 1 - sitaGLIPtin (JANUVIA) 100 MG tablet; Take 1 tablet (100 mg total) by mouth daily.  Dispense: 90 tablet; Refill: 1  11. Gastroesophageal reflux disease, esophagitis presence not specified  - omeprazole (PRILOSEC) 40 MG capsule; Take 1 capsule (40 mg total) by mouth daily.  Dispense: 90 capsule; Refill: 1

## 2019-02-18 LAB — CBC WITH DIFFERENTIAL/PLATELET
Absolute Monocytes: 696 cells/uL (ref 200–950)
Basophils Absolute: 113 cells/uL (ref 0–200)
Basophils Relative: 1.3 %
Eosinophils Absolute: 131 cells/uL (ref 15–500)
Eosinophils Relative: 1.5 %
HCT: 37.5 % — ABNORMAL LOW (ref 38.5–50.0)
Hemoglobin: 11.9 g/dL — ABNORMAL LOW (ref 13.2–17.1)
Lymphs Abs: 1505 cells/uL (ref 850–3900)
MCH: 23.7 pg — ABNORMAL LOW (ref 27.0–33.0)
MCHC: 31.7 g/dL — ABNORMAL LOW (ref 32.0–36.0)
MCV: 74.7 fL — ABNORMAL LOW (ref 80.0–100.0)
MPV: 11 fL (ref 7.5–12.5)
Monocytes Relative: 8 %
Neutro Abs: 6255 cells/uL (ref 1500–7800)
Neutrophils Relative %: 71.9 %
Platelets: 388 10*3/uL (ref 140–400)
RBC: 5.02 10*6/uL (ref 4.20–5.80)
RDW: 17 % — ABNORMAL HIGH (ref 11.0–15.0)
Total Lymphocyte: 17.3 %
WBC: 8.7 10*3/uL (ref 3.8–10.8)

## 2019-02-18 LAB — LIPID PANEL
Cholesterol: 110 mg/dL (ref <200)
HDL: 46 mg/dL (ref >=40)
LDL Cholesterol (Calc): 48 mg/dL (calc)
Non-HDL Cholesterol (Calc): 64 mg/dL (calc) (ref <130)
Total CHOL/HDL Ratio: 2.4 (calc) (ref <5.0)
Triglycerides: 82 mg/dL (ref <150)

## 2019-02-18 LAB — COMPLETE METABOLIC PANEL WITH GFR
AG Ratio: 1.8 (calc) (ref 1.0–2.5)
ALT: 12 U/L (ref 9–46)
AST: 12 U/L (ref 10–35)
Albumin: 4.1 g/dL (ref 3.6–5.1)
Alkaline phosphatase (APISO): 75 U/L (ref 35–144)
BUN: 13 mg/dL (ref 7–25)
CO2: 29 mmol/L (ref 20–32)
Calcium: 9.1 mg/dL (ref 8.6–10.3)
Chloride: 106 mmol/L (ref 98–110)
Creat: 0.89 mg/dL (ref 0.70–1.18)
GFR, Est African American: 97 mL/min/{1.73_m2} (ref >=60)
GFR, Est Non African American: 84 mL/min/{1.73_m2} (ref >=60)
Globulin: 2.3 g/dL (calc) (ref 1.9–3.7)
Glucose, Bld: 120 mg/dL — ABNORMAL HIGH (ref 65–99)
Potassium: 4.5 mmol/L (ref 3.5–5.3)
Sodium: 143 mmol/L (ref 135–146)
Total Bilirubin: 0.4 mg/dL (ref 0.2–1.2)
Total Protein: 6.4 g/dL (ref 6.1–8.1)

## 2019-02-18 LAB — MICROALBUMIN / CREATININE URINE RATIO
Creatinine, Urine: 129 mg/dL (ref 20–320)
Microalb Creat Ratio: 10 mcg/mg creat (ref <30)
Microalb, Ur: 1.3 mg/dL

## 2019-04-02 ENCOUNTER — Ambulatory Visit (INDEPENDENT_AMBULATORY_CARE_PROVIDER_SITE_OTHER): Payer: Medicare Other

## 2019-04-02 ENCOUNTER — Other Ambulatory Visit: Payer: Self-pay

## 2019-04-02 DIAGNOSIS — Z23 Encounter for immunization: Secondary | ICD-10-CM

## 2019-04-04 IMAGING — CT CT HEAD WO/W CM
4 of 7 series · 13 of 47 positions shown, 14 images · IV contrast (Omni 300)
Comparison: Prior preoperative CT and MRI from 07/21/2018

Addendum:
CLINICAL DATA: Follow-up postsurgical evaluation status post
meningioma resection.

EXAM:
CT HEAD WITHOUT AND WITH CONTRAST
TECHNIQUE: Contiguous axial images were obtained from the base of the skull
through the vertex without and with intravenous contrast
CONTRAST:  75mL OMNIPAQUE IOHEXOL 300 MG/ML  SOLN

[Series 3: head without without · axial · non-contrast · 0.44mm/px · z∈[-76,+29]mm · 4 of 35 slices shown, 5 images]
[im 7/35  brain]
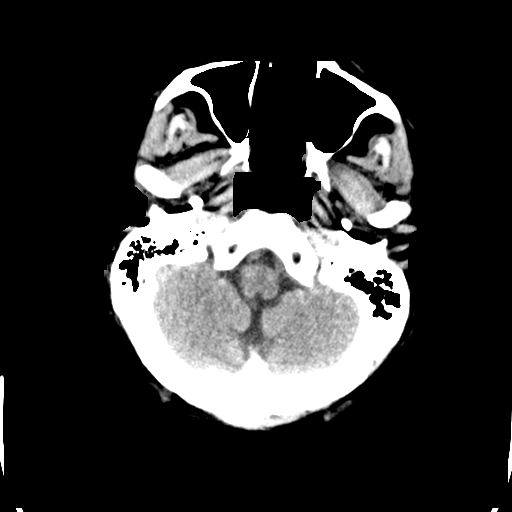
[im 7/35  bone]
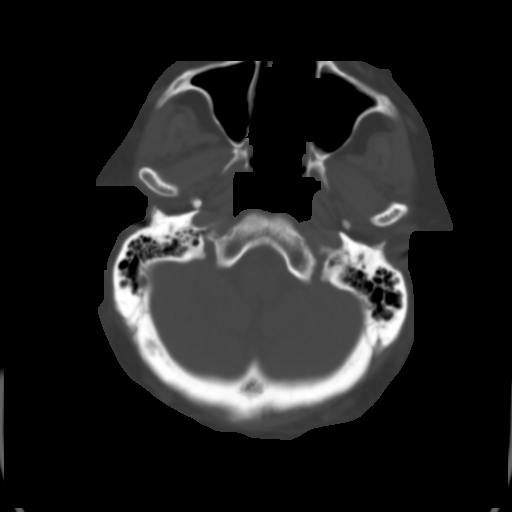
[im 14/35  brain]
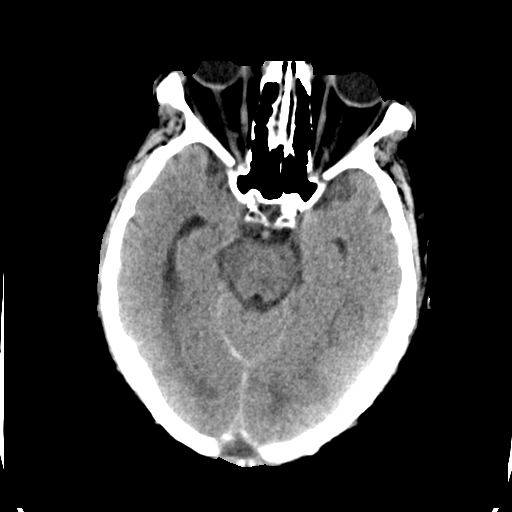
[im 21/35  brain]
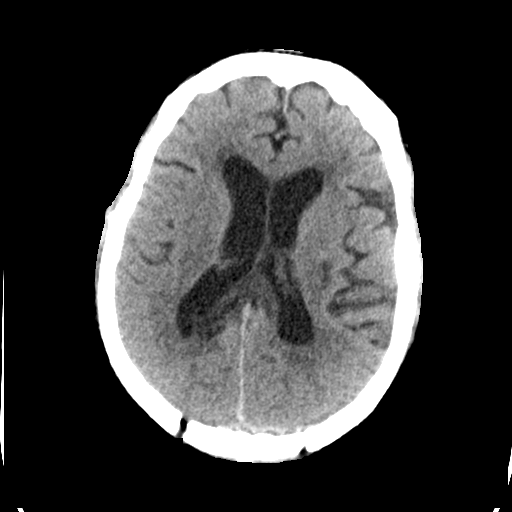
[im 28/35  brain]
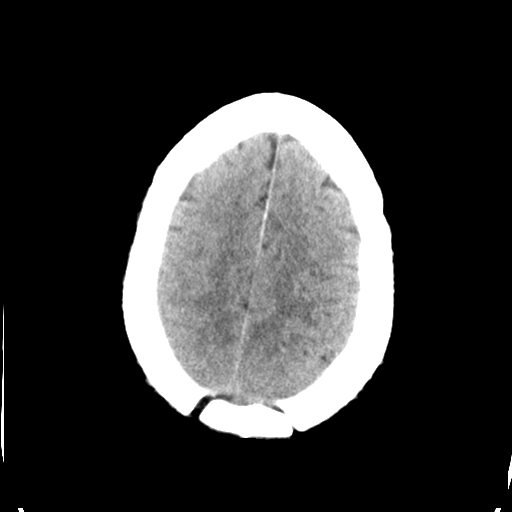

[Series 5: head with · axial · 0.44mm/px · z∈[-76,-6]mm · 3 of 35 slices shown]
[im 7/35  brain]
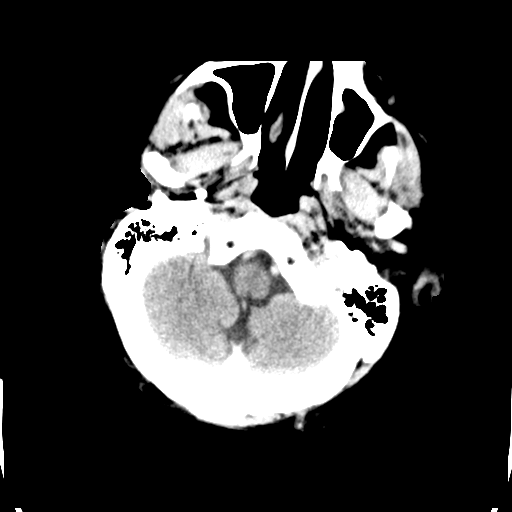
[im 14/35  brain]
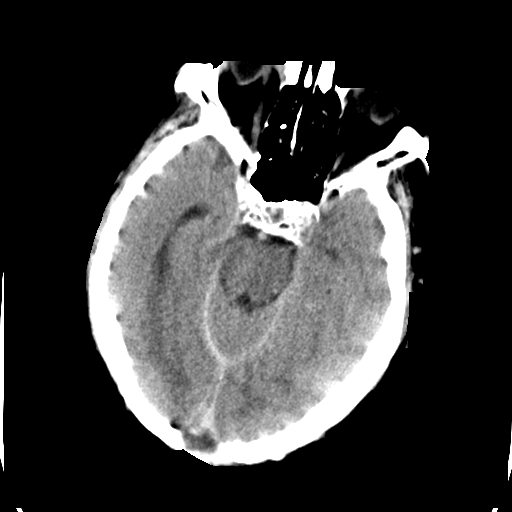
[im 21/35  brain]
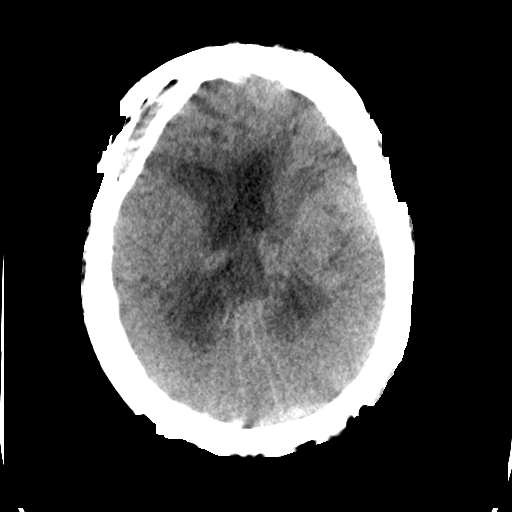

[Series 7: head with cor · coronal · 0.34mm/px · 3 of 72 slices shown]
[im 18/72  brain]
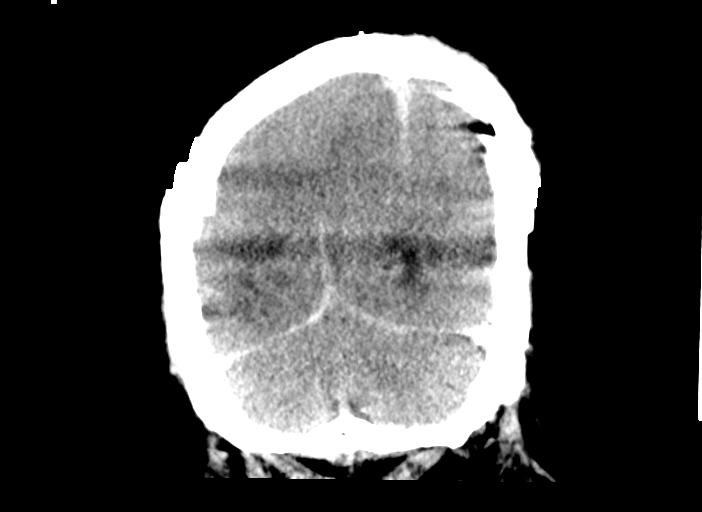
[im 36/72  brain]
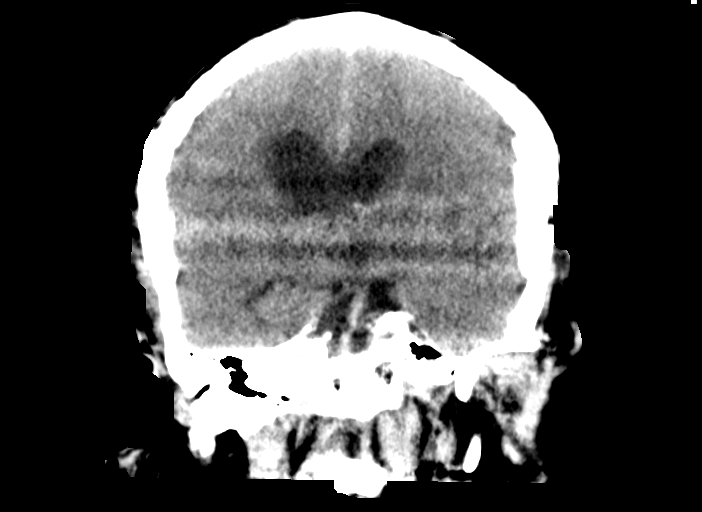
[im 54/72  brain]
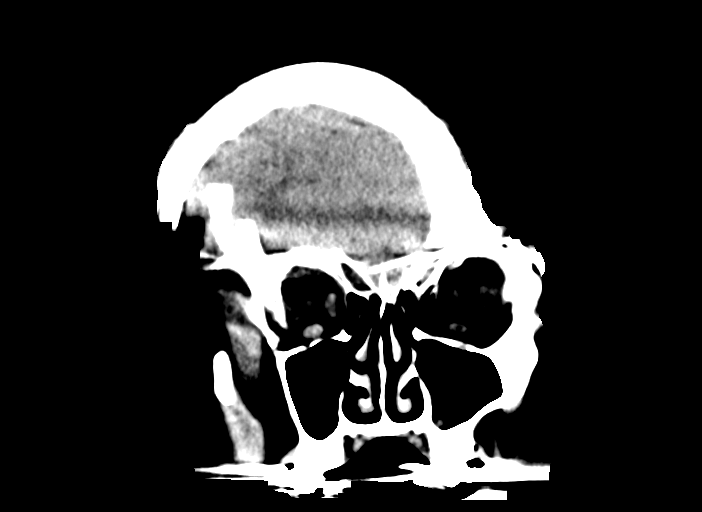

[Series 8: head with sag · sagittal · 0.34mm/px · 3 of 67 slices shown]
[im 23/67  brain]
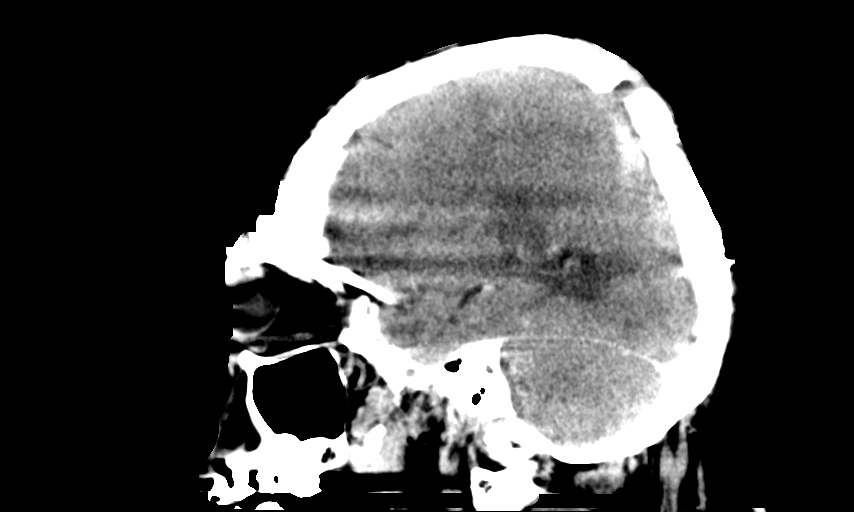
[im 34/67  brain]
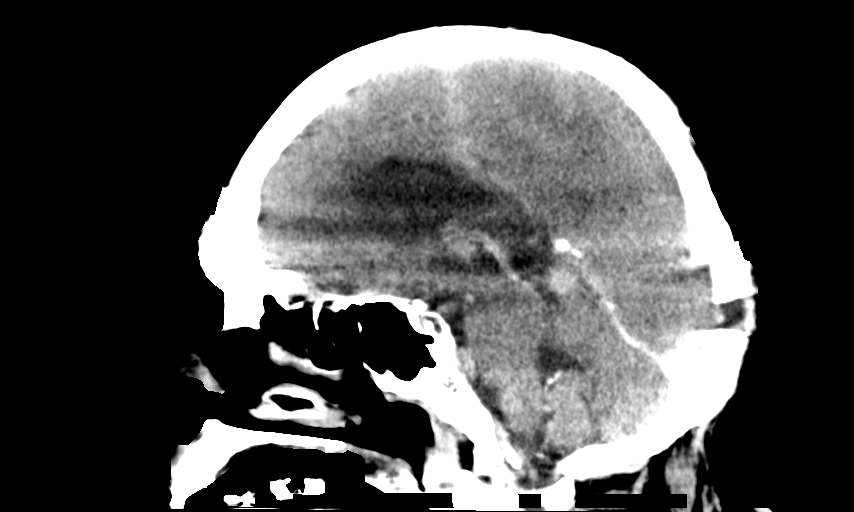
[im 45/67  brain]
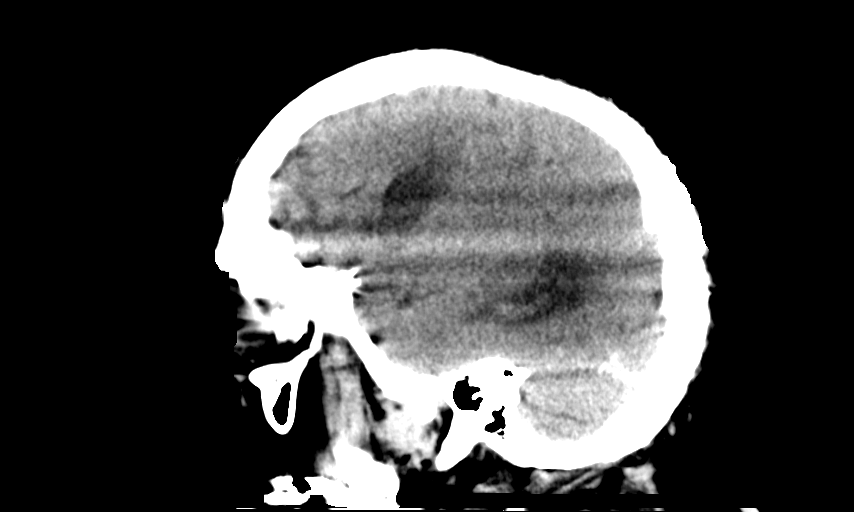

[13 of 47 positions shown; findings below may reference images not displayed]

FINDINGS: Brain: Examination degraded by motion artifact.

Postoperative changes from prior parieto-occipital craniotomy for
meningioma resection seen. Dural thickening and enhancement
subjacent to the craniotomy bone flap most consistent with
postoperative changes. Trace residual extra-axial/subdural
collection measuring up to 2 mm seen just superficial to the dura.
No significant mass effect.

Previously seen meningioma positioned at the cerebral aqueduct has
been resected. Probable small amount of residual blood products
within the resection cavity. Hypodensity within the adjacent right
greater than left splenium could reflect residual edema and/or Peri
section ischemic change (series 3, image 20). The adjacent vein of
Bolaji and internal cerebral veins deviated to the left, similar to
previous. Suspected small amount of residual tumor adjacent to the
vein of [place]aji, although difficult to discern by CT as any potential
residual tumor and adjacent vein enhanced to similar attenuation.

Small amount of degenerating blood products seen layering within the
occipital horn of the right lateral ventricle. Trace residual
pneumocephalus at the right frontal horn. Overall ventricular size
perhaps slightly decreased from preoperative exam. No significant
midline shift. Basilar cisterns remain patent.

Underlying age-related cerebral atrophy with chronic microvascular
ischemic disease again noted. Remote left basal ganglia lacunar
infarct unchanged. Mineralization/calcification at the lateral right
thalamus unchanged. No other acute large vessel territory infarct.

Vascular: Calcified atherosclerosis at the skull base. Normal
intravascular enhancement seen within the intracranial circulation
following contrast administration.

Skull: Post craniotomy changes noted posteriorly. Right frontal burr
hole from previous CBD noted. Scalp soft tissues demonstrate no
acute finding.

Sinuses/Orbits: Globes and orbital soft tissues within normal
limits. Paranasal sinuses are largely clear. No mastoid effusion.

Other: None.
IMPRESSION: 1. Postoperative changes from interval posterior craniotomy for
meningioma resection. Probable small amount of residual blood
products at the resection cavity. Parenchymal hypodensity within the
adjacent splenium could reflect residual edema and/or peri resection
ischemic changes. No obvious gross residual tumor, although possible
small amount of residual tumor along the adjacent vein of Bolaji not
excluded by CT.
2. Sequelae of prior right frontal EVD with trace residual
pneumocephalus and small amount of residual intraventricular
hemorrhage. Overall ventricular size perhaps slightly decreased from
preoperative exam.
3. No other new acute intracranial abnormality. Underlying chronic
microvascular ischemic changes with remote left basal ganglia
lacunar infarct unchanged.

ADDENDUM:
In addition to the initially described findings, note made of a
cm mass within the superficial aspect of the left parotid gland,
incompletely visualized. Nonemergent outpatient ENT referral for
further workup and evaluation suggested for further evaluation.

*** End of Addendum ***

## 2019-04-17 ENCOUNTER — Ambulatory Visit (INDEPENDENT_AMBULATORY_CARE_PROVIDER_SITE_OTHER): Payer: Medicare Other

## 2019-04-17 VITALS — BP 147/51 | Ht 66.0 in | Wt 214.0 lb

## 2019-04-17 DIAGNOSIS — Z0000 Encounter for general adult medical examination without abnormal findings: Secondary | ICD-10-CM | POA: Diagnosis not present

## 2019-04-17 NOTE — Patient Instructions (Signed)
Jason Farrell , Thank you for taking time to come for your Medicare Wellness Visit. I appreciate your ongoing commitment to your health goals. Please review the following plan we discussed and let me know if I can assist you in the future.   Screening recommendations/referrals: Colonoscopy: no longer required Recommended yearly ophthalmology/optometry visit for glaucoma screening and checkup Recommended yearly dental visit for hygiene and checkup  Vaccinations: Influenza vaccine: done 04/02/19 Pneumococcal vaccine: done 07/10/16 Tdap vaccine: due - please contact us if you get a cut or scrape Shingles vaccine: Shingrix discussed. Please contact your pharmacy for coverage information.   Advanced directives: Advance directive discussed with you today. Even though you declined this today please call our office should you change your mind and we can give you the proper paperwork for you to fill out.  Conditions/risks identified: Recommend increasing physical activity  Next appointment: Please follow up in one year for your Medicare Annual Wellness visit.    Preventive Care 76 Years and Older, Male Preventive care refers to lifestyle choices and visits with your health care provider that can promote health and wellness. What does preventive care include?  A yearly physical exam. This is also called an annual well check.  Dental exams once or twice a year.  Routine eye exams. Ask your health care provider how often you should have your eyes checked.  Personal lifestyle choices, including:  Daily care of your teeth and gums.  Regular physical activity.  Eating a healthy diet.  Avoiding tobacco and drug use.  Limiting alcohol use.  Practicing safe sex.  Taking low doses of aspirin every day.  Taking vitamin and mineral supplements as recommended by your health care provider. What happens during an annual well check? The services and screenings done by your health care provider  during your annual well check will depend on your age, overall health, lifestyle risk factors, and family history of disease. Counseling  Your health care provider may ask you questions about your:  Alcohol use.  Tobacco use.  Drug use.  Emotional well-being.  Home and relationship well-being.  Sexual activity.  Eating habits.  History of falls.  Memory and ability to understand (cognition).  Work and work Statistician. Screening  You may have the following tests or measurements:  Height, weight, and BMI.  Blood pressure.  Lipid and cholesterol levels. These may be checked every 5 years, or more frequently if you are over 2 years old.  Skin check.  Lung cancer screening. You may have this screening every year starting at age 76 if you have a 30-pack-year history of smoking and currently smoke or have quit within the past 15 years.  Fecal occult blood test (FOBT) of the stool. You may have this test every year starting at age 76.  Flexible sigmoidoscopy or colonoscopy. You may have a sigmoidoscopy every 5 years or a colonoscopy every 10 years starting at age 76.  Prostate cancer screening. Recommendations will vary depending on your family history and other risks.  Hepatitis C blood test.  Hepatitis B blood test.  Sexually transmitted disease (STD) testing.  Diabetes screening. This is done by checking your blood sugar (glucose) after you have not eaten for a while (fasting). You may have this done every 1-3 years.  Abdominal aortic aneurysm (AAA) screening. You may need this if you are a current or former smoker.  Osteoporosis. You may be screened starting at age 76 if you are at high risk. Talk with your health care  provider about your test results, treatment options, and if necessary, the need for more tests. Vaccines  Your health care provider may recommend certain vaccines, such as:  Influenza vaccine. This is recommended every year.  Tetanus,  diphtheria, and acellular pertussis (Tdap, Td) vaccine. You may need a Td booster every 10 years.  Zoster vaccine. You may need this after age 76.  Pneumococcal 13-valent conjugate (PCV13) vaccine. One dose is recommended after age 76.  Pneumococcal polysaccharide (PPSV23) vaccine. One dose is recommended after age 76. Talk to your health care provider about which screenings and vaccines you need and how often you need them. This information is not intended to replace advice given to you by your health care provider. Make sure you discuss any questions you have with your health care provider. Document Released: 07/29/2015 Document Revised: 03/21/2016 Document Reviewed: 05/03/2015 Elsevier Interactive Patient Education  2017 [place] Prevention in the Home Falls can cause injuries. They can happen to people of all ages. There are many things you can do to make your home safe and to help prevent falls. What can I do on the outside of my home?  Regularly fix the edges of walkways and driveways and fix any cracks.  Remove anything that might make you trip as you walk through a door, such as a raised step or threshold.  Trim any bushes or trees on the path to your home.  Use bright outdoor lighting.  Clear any walking paths of anything that might make someone trip, such as rocks or tools.  Regularly check to see if handrails are loose or broken. Make sure that both sides of any steps have handrails.  Any raised decks and porches should have guardrails on the edges.  Have any leaves, snow, or ice cleared regularly.  Use sand or salt on walking paths during winter.  Clean up any spills in your garage right away. This includes oil or grease spills. What can I do in the bathroom?  Use night lights.  Install grab bars by the toilet and in the tub and shower. Do not use towel bars as grab bars.  Use non-skid mats or decals in the tub or shower.  If you need to sit down in  the shower, use a plastic, non-slip stool.  Keep the floor dry. Clean up any water that spills on the floor as soon as it happens.  Remove soap buildup in the tub or shower regularly.  Attach bath mats securely with double-sided non-slip rug tape.  Do not have throw rugs and other things on the floor that can make you trip. What can I do in the bedroom?  Use night lights.  Make sure that you have a light by your bed that is easy to reach.  Do not use any sheets or blankets that are too big for your bed. They should not hang down onto the floor.  Have a firm chair that has side arms. You can use this for support while you get dressed.  Do not have throw rugs and other things on the floor that can make you trip. What can I do in the kitchen?  Clean up any spills right away.  Avoid walking on wet floors.  Keep items that you use a lot in easy-to-reach places.  If you need to reach something above you, use a strong step stool that has a grab bar.  Keep electrical cords out of the way.  Do not use floor polish  or wax that makes floors slippery. If you must use wax, use non-skid floor wax.  Do not have throw rugs and other things on the floor that can make you trip. What can I do with my stairs?  Do not leave any items on the stairs.  Make sure that there are handrails on both sides of the stairs and use them. Fix handrails that are broken or loose. Make sure that handrails are as long as the stairways.  Check any carpeting to make sure that it is firmly attached to the stairs. Fix any carpet that is loose or worn.  Avoid having throw rugs at the top or bottom of the stairs. If you do have throw rugs, attach them to the floor with carpet tape.  Make sure that you have a light switch at the top of the stairs and the bottom of the stairs. If you do not have them, ask someone to add them for you. What else can I do to help prevent falls?  Wear shoes that:  Do not have high  heels.  Have rubber bottoms.  Are comfortable and fit you well.  Are closed at the toe. Do not wear sandals.  If you use a stepladder:  Make sure that it is fully opened. Do not climb a closed stepladder.  Make sure that both sides of the stepladder are locked into place.  Ask someone to hold it for you, if possible.  Clearly mark and make sure that you can see:  Any grab bars or handrails.  First and last steps.  Where the edge of each step is.  Use tools that help you move around (mobility aids) if they are needed. These include:  Canes.  Walkers.  Scooters.  Crutches.  Turn on the lights when you go into a dark area. Replace any light bulbs as soon as they burn out.  Set up your furniture so you have a clear path. Avoid moving your furniture around.  If any of your floors are uneven, fix them.  If there are any pets around you, be aware of where they are.  Review your medicines with your doctor. Some medicines can make you feel dizzy. This can increase your chance of falling. Ask your doctor what other things that you can do to help prevent falls. This information is not intended to replace advice given to you by your health care provider. Make sure you discuss any questions you have with your health care provider. Document Released: 04/28/2009 Document Revised: 12/08/2015 Document Reviewed: 08/06/2014 Elsevier Interactive Patient Education  2017 [employer].

## 2019-04-17 NOTE — Progress Notes (Signed)
Subjective:   Jason Farrell. is a 78 y.o. male who presents for Medicare Annual/Subsequent preventive examination.  Virtual Visit via Telephone Note  I connected with Jason Farrell. on 04/17/19 at 10:00 AM EDT by telephone and verified that I am speaking with the correct person using two identifiers.  Medicare Annual Wellness visit completed telephonically due to Covid-19 pandemic.   Location: Patient: home Provider: office   I discussed the limitations, risks, security and privacy concerns of performing an evaluation and management service by telephone and the availability of in person appointments. The patient expressed understanding and agreed to proceed.  Some vital signs may be absent or patient reported.   Clemetine Marker, LPN    Review of Systems:   Cardiac Risk Factors include: diabetes mellitus;dyslipidemia;male gender;hypertension;advanced age (>37men, >83 women);obesity (BMI >30kg/m2)     Objective:    Vitals: BP (!) 147/51   Ht 5\' 6"  (1.676 m)   Wt 214 lb (97.1 kg)   BMI 34.54 kg/m   Body mass index is 34.54 kg/m.  Advanced Directives 04/17/2019 09/26/2018 09/23/2018 09/04/2018 08/30/2018 08/28/2018 07/21/2018  Does Patient Have a Medical Advance Directive? No No No No No No No;Yes  Would patient like information on creating a medical advance directive? No - Patient declined - No - Patient declined No - Patient declined No - Patient declined No - Patient declined -    Tobacco Social History   Tobacco Use  Smoking Status Former Smoker  . Packs/day: 1.50  . Years: 35.00  . Pack years: 52.50  . Types: Cigarettes  . Start date: 07/17/1959  . Quit date: 08/27/1994  . Years since quitting: 24.6  Smokeless Tobacco Never Used  Tobacco Comment   smoking cessation materials not required     Counseling given: Not Answered Comment: smoking cessation materials not required   Clinical Intake:  Pre-visit preparation completed: Yes  Pain : No/denies  pain     BMI - recorded: 34.54 Nutritional Status: BMI > 30  Obese Nutritional Risks: None Diabetes: Yes CBG done?: No Did pt. bring in CBG monitor from home?: No   Nutrition Risk Assessment:  Has the patient had any N/V/D within the last 2 months?  No  Does the patient have any non-healing wounds?  No  Has the patient had any unintentional weight loss or weight gain?  No   Diabetes:  Is the patient diabetic?  Yes  If diabetic, was a CBG obtained today?  No  Did the patient bring in their glucometer from home?  No  How often do you monitor your CBG's? Daily fasting, today = 115.   Financial Strains and Diabetes Management:  Are you having any financial strains with the device, your supplies or your medication? No .  Does the patient want to be seen by Chronic Care Management for management of their diabetes?  No  Would the patient like to be referred to a Nutritionist or for Diabetic Management?  No   Diabetic Exams:  Diabetic Eye Exam: Completed 03/2019 per patient. Records requested today from [facility].   Diabetic Foot Exam: Completed 02/21/18. Pt has been advised about the importance in completing this exam. Pt is scheduled for diabetic foot exam on 08/21/19.   How often do you need to have someone help you when you read instructions, pamphlets, or other written materials from your doctor or pharmacy?: 1 - Never  Interpreter Needed?: No  Information entered by :: Clemetine Marker LPN  Past Medical History:  Diagnosis Date  . Coronary artery disease   . Diabetes mellitus without complication ([place])   . Dyslipidemia   . GERD (gastroesophageal reflux disease)   . Hyperlipidemia   . Hypertension   . MI (myocardial infarction) ([place])   . Osteoarthritis    Past Surgical History:  Procedure Laterality Date  . CARDIAC CATHETERIZATION  2006   stent placement  . COLONOSCOPY    . CORONARY ANGIOPLASTY WITH STENT PLACEMENT  2006   stent x 2   . CORONARY ANGIOPLASTY WITH STENT  PLACEMENT  1996   stent x 3   . CRANIOTOMY  08/12/2018  . FOOT SURGERY Right   . 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 Hyperlipidemia Father   . Hypertension Father   . Alcohol abuse Father   . Cancer Sister        lung  . Hypertension Sister   . Heart disease Sister   . Hyperlipidemia Sister   . COPD Sister    Social History   Socioeconomic History  . Marital status: Married    Spouse name: Manuela Schwartz  . Number of children: 3  . Years of education: some college  . Highest education level: 12th grade  Occupational History  . Occupation: Retired  Scientific laboratory technician  . Financial resource strain: Not hard at all  . Food insecurity    Worry: Never true    Inability: Never true  . Transportation needs    Medical: No    Non-medical: No  Tobacco Use  . Smoking status: Former Smoker    Packs/day: 1.50    Years: 35.00    Pack years: 52.50    Types: Cigarettes    Start date: 07/17/1959    Quit date: 08/27/1994    Years since quitting: 24.6  . Smokeless tobacco: Never Used  . Tobacco comment: smoking cessation materials not required  Substance and Sexual Activity  . Alcohol use: Not Currently    Alcohol/week: 0.0 standard drinks  . Drug use: No  . Sexual activity: Not Currently    Partners: Female  Lifestyle  . Physical activity    Days per week: 0 days    Minutes per session: 0 min  . Stress: Not at all  Relationships  . Social Herbalist on phone: Once a week    Gets together: Never    Attends religious service: Never    Active member of club or organization: No    Attends meetings of clubs or organizations: Never    Relationship status: Married  Other Topics Concern  . Not on file  Social History Narrative  . Not on file    Outpatient Encounter Medications as of 04/17/2019   Medication Sig  . acetaminophen (TYLENOL) 325 MG tablet Take 1-2 tablets (325-650 mg total) by mouth every 4 (four) hours as needed for mild pain.  Marland Kitchen amiodarone (PACERONE) 200 MG tablet Take 1 tablet (200 mg total) by mouth daily.  Marland Kitchen amLODipine (NORVASC) 2.5 MG tablet Take 1 tablet (2.5 mg total) by mouth daily.  Marland Kitchen aspirin 81 MG tablet Take 81 mg by mouth daily.  Marland Kitchen atorvastatin (LIPITOR) 40 MG tablet Take 1 tablet (40 mg total) by mouth daily.  . Cholecalciferol (VITAMIN D) 50 MCG (2000 UT) tablet Take 2,000 Units by mouth daily.  Marland Kitchen  docusate sodium (COLACE) 100 MG capsule Take 100 mg by mouth 2 (two) times daily. Pt takes every other day  . glipiZIDE (GLUCOTROL) 5 MG tablet TAKE 1 TABLET TWICE A DAY BEFORE MEALS  . glucose blood test strip Use as instructed  . lisinopril-hydrochlorothiazide (ZESTORETIC) 20-12.5 MG tablet Take 1 tablet by mouth 2 (two) times daily.  . magnesium oxide (MAG-OX) 400 (241.3 Mg) MG tablet Take 1 tablet (400 mg total) by mouth 2 (two) times daily.  . metFORMIN (GLUCOPHAGE-XR) 750 MG 24 hr tablet Take 2 tablets (1,500 mg total) by mouth daily with breakfast.  . metoprolol succinate (TOPROL-XL) 50 MG 24 hr tablet Take 1 tablet (50 mg total) by mouth daily. Take with or immediately following a meal.  . Multiple Vitamins-Minerals (CENTRUM SILVER PO) Take 1 tablet by mouth daily.   Marland Kitchen omeprazole (PRILOSEC) 40 MG capsule Take 1 capsule (40 mg total) by mouth daily.  . potassium chloride SA (K-DUR) 20 MEQ tablet Take 1 tablet (20 mEq total) by mouth daily.  . sitaGLIPtin (JANUVIA) 100 MG tablet Take 1 tablet (100 mg total) by mouth daily.   No facility-administered encounter medications on file as of 04/17/2019.     Activities of Daily Living In your present state of health, do you have any difficulty performing the following activities: 04/17/2019 02/17/2019  Hearing? N N  Comment declines hearing aids -  Vision? N Y  Difficulty concentrating or making decisions? N N   Walking or climbing stairs? N N  Dressing or bathing? N N  Doing errands, shopping? N N  Preparing Food and eating ? N -  Using the Toilet? N -  In the past six months, have you accidently leaked urine? N -  Do you have problems with loss of bowel control? N -  Managing your Medications? N -  Managing your Finances? N -  Housekeeping or managing your Housekeeping? N -  Some recent data might be hidden    Patient Care Team: Steele Sizer, MD as PCP - General (Family Medicine) Rockey Situ Kathlene November, MD as PCP - Cardiology (Cardiology) Minna Merritts, MD as Consulting Physician (Cardiology)   Assessment:   This is a routine wellness examination for Jason Farrell.  Exercise Activities and Dietary recommendations Current Exercise Habits: The patient does not participate in regular exercise at present, Exercise limited by: None identified  Goals    . DIET - INCREASE WATER INTAKE     Recommend to drink at least 6-8 8oz glasses of water per day.       Fall Risk Fall Risk  04/17/2019 02/17/2019 10/17/2018 07/21/2018 07/04/2018  Falls in the past year? 0 0 0 0 0  Number falls in past yr: 0 0 0 0 0  Injury with Fall? 0 0 0 0 0  Risk for fall due to : History of fall(s) - - - -  Risk for fall due to: Comment - - - - -  Follow up Falls prevention discussed - - - -   FALL RISK PREVENTION PERTAINING TO THE HOME:  Any stairs in or around the home? Yes  If so, do they handrails? Yes   Home free of loose throw rugs in walkways, pet beds, electrical cords, etc? Yes  Adequate lighting in your home to reduce risk of falls? Yes   ASSISTIVE DEVICES UTILIZED TO PREVENT FALLS:  Life alert? No  Use of a cane, walker or w/c? No  Grab bars in the bathroom? Yes  Shower chair or  bench in shower? No  Elevated toilet seat or a handicapped toilet? No   DME ORDERS:  DME order needed?  No   TIMED UP AND GO:  Was the test performed? No . Telephonic visit.   Education: Fall risk prevention has been  discussed.  Intervention(s) required? No   Depression Screen PHQ 2/9 Scores 04/17/2019 02/17/2019 10/17/2018 07/21/2018  PHQ - 2 Score 0 0 0 0  PHQ- 9 Score - 2 0 5    Cognitive Function     6CIT Screen 04/17/2019 01/23/2018 12/03/2016  What Year? 0 points 0 points 0 points  What month? 0 points 0 points 0 points  What time? 0 points 0 points 0 points  Count back from 20 0 points 0 points 0 points  Months in reverse 0 points 2 points 2 points  Repeat phrase 0 points 2 points 4 points  Total Score 0 4 6    Immunization History  Administered Date(s) Administered  . Fluad Quad(high Dose 65+) 04/02/2019  . Influenza, High Dose Seasonal PF 03/19/2018  . Influenza, Seasonal, Injecte, Preservative Fre 05/10/2014  . Influenza-Unspecified 04/15/2015, 03/21/2016  . Pneumococcal Conjugate-13 07/10/2016  . Pneumococcal Polysaccharide-23 07/22/2009    Qualifies for Shingles Vaccine? Yes  Zostavax completed per patient in [place]. Due for Shingrix. Education has been provided regarding the importance of this vaccine. Pt has been advised to call insurance company to determine out of pocket expense. Advised may also receive vaccine at local pharmacy or Health Dept. Verbalized acceptance and understanding.  Tdap: Although this vaccine is not a covered service during a Wellness Exam, does the patient still wish to receive this vaccine today?  No .  Education has been provided regarding the importance of this vaccine. Advised may receive this vaccine at local pharmacy or Health Dept. Aware to provide a copy of the vaccination record if obtained from local pharmacy or Health Dept. Verbalized acceptance and understanding.  Flu Vaccine: Up to date  Pneumococcal Vaccine: Up to date    Screening Tests Health Maintenance  Topic Date Due  . OPHTHALMOLOGY EXAM  03/20/2018  . FOOT EXAM  02/22/2019  . HEMOGLOBIN A1C  08/20/2019  . TETANUS/TDAP  07/16/2021  . INFLUENZA VACCINE  Completed  . PNA vac Low  Risk Adult  Completed   Cancer Screenings:  Colorectal Screening: No longer required.   Lung Cancer Screening: (Low Dose CT Chest recommended if Age 24-80 years, 30 pack-year currently smoking OR have quit w/in 15years.) does not qualify.    Additional Screening:  Hepatitis C Screening: no longer required  Vision Screening: Recommended annual ophthalmology exams for early detection of glaucoma and other disorders of the eye. Is the patient up to date with their annual eye exam?  Yes  Who is the provider or what is the name of the office in which the pt attends annual eye exams? [facility]  Dental Screening: Recommended annual dental exams for proper oral hygiene  Community Resource Referral:  CRR required this visit?  No       Plan:    I have personally reviewed and addressed the Medicare Annual Wellness questionnaire and have noted the following in the patient's chart:  A. Medical and social history B. Use of alcohol, tobacco or illicit drugs  C. Current medications and supplements D. Functional ability and status E.  Nutritional status F.  Physical activity G. Advance directives H. List of other physicians I.  Hospitalizations, surgeries, and ER visits in previous 12 months J.  Vitals K. Screenings such as hearing and vision if needed, cognitive and depression L. Referrals and appointments   In addition, I have reviewed and discussed with patient certain preventive protocols, quality metrics, and best practice recommendations. A written personalized care plan for preventive services as well as general preventive health recommendations were provided to patient.   Signed,  Clemetine Marker, LPN Nurse Health Advisor   Nurse Notes: none

## 2019-07-20 ENCOUNTER — Other Ambulatory Visit: Payer: Self-pay | Admitting: *Deleted

## 2019-07-20 ENCOUNTER — Telehealth: Payer: Self-pay | Admitting: Cardiovascular Disease

## 2019-07-20 MED ORDER — AMIODARONE HCL 200 MG PO TABS: 200.0000 mg | ORAL_TABLET | Freq: Every day | ORAL | 0 refills | Status: DC

## 2019-07-20 NOTE — Telephone Encounter (Signed)
Requested Prescriptions   Signed Prescriptions Disp Refills  . amiodarone (PACERONE) 200 MG tablet 90 tablet 0    Sig: Take 1 tablet (200 mg total) by mouth daily.    Authorizing Provider: Minna Merritts    Ordering User: Britt Bottom

## 2019-07-20 NOTE — Telephone Encounter (Signed)
*  STAT* If patient is at the pharmacy, call can be transferred to refill team.   1. Which medications need to be refilled? (please list name of each medication and dose if known) amiodarone 200mg  daily  2. Which pharmacy/location (including street and city if local pharmacy) is [facility] sent [facility]  3. Do they need a 30 day or 90 day supply? [place]

## 2019-08-21 ENCOUNTER — Encounter: Payer: Self-pay | Admitting: Family Medicine

## 2019-08-21 ENCOUNTER — Ambulatory Visit (INDEPENDENT_AMBULATORY_CARE_PROVIDER_SITE_OTHER): Payer: Medicare Other | Admitting: Family Medicine

## 2019-08-21 ENCOUNTER — Other Ambulatory Visit: Payer: Self-pay

## 2019-08-21 VITALS — BP 154/66 | HR 62 | Temp 97.5°F | Resp 16 | Ht 66.5 in | Wt 220.8 lb

## 2019-08-21 DIAGNOSIS — E1159 Type 2 diabetes mellitus with other circulatory complications: Secondary | ICD-10-CM

## 2019-08-21 DIAGNOSIS — G4733 Obstructive sleep apnea (adult) (pediatric): Secondary | ICD-10-CM

## 2019-08-21 DIAGNOSIS — E785 Hyperlipidemia, unspecified: Secondary | ICD-10-CM

## 2019-08-21 DIAGNOSIS — E1129 Type 2 diabetes mellitus with other diabetic kidney complication: Secondary | ICD-10-CM | POA: Diagnosis not present

## 2019-08-21 DIAGNOSIS — I10 Essential (primary) hypertension: Secondary | ICD-10-CM | POA: Diagnosis not present

## 2019-08-21 DIAGNOSIS — J42 Unspecified chronic bronchitis: Secondary | ICD-10-CM | POA: Diagnosis not present

## 2019-08-21 DIAGNOSIS — D692 Other nonthrombocytopenic purpura: Secondary | ICD-10-CM | POA: Diagnosis not present

## 2019-08-21 DIAGNOSIS — E1169 Type 2 diabetes mellitus with other specified complication: Secondary | ICD-10-CM

## 2019-08-21 DIAGNOSIS — K219 Gastro-esophageal reflux disease without esophagitis: Secondary | ICD-10-CM

## 2019-08-21 DIAGNOSIS — D329 Benign neoplasm of meninges, unspecified: Secondary | ICD-10-CM | POA: Diagnosis not present

## 2019-08-21 DIAGNOSIS — E669 Obesity, unspecified: Secondary | ICD-10-CM | POA: Diagnosis not present

## 2019-08-21 DIAGNOSIS — D649 Anemia, unspecified: Secondary | ICD-10-CM | POA: Diagnosis not present

## 2019-08-21 DIAGNOSIS — R809 Proteinuria, unspecified: Secondary | ICD-10-CM

## 2019-08-21 DIAGNOSIS — Z9989 Dependence on other enabling machines and devices: Secondary | ICD-10-CM

## 2019-08-21 LAB — POCT GLYCOSYLATED HEMOGLOBIN (HGB A1C): HbA1c, POC (controlled diabetic range): 6 % (ref 0.0–7.0)

## 2019-08-21 MED ORDER — OMEPRAZOLE 40 MG PO CPDR: 40.0000 mg | DELAYED_RELEASE_CAPSULE | Freq: Every day | ORAL | 1 refills | Status: DC

## 2019-08-21 MED ORDER — AMLODIPINE BESYLATE 2.5 MG PO TABS: 2.5000 mg | ORAL_TABLET | Freq: Every day | ORAL | 1 refills | Status: DC

## 2019-08-21 MED ORDER — METOPROLOL SUCCINATE ER 50 MG PO TB24: 50.0000 mg | ORAL_TABLET | Freq: Every day | ORAL | 1 refills | Status: DC

## 2019-08-21 MED ORDER — POTASSIUM CHLORIDE CRYS ER 20 MEQ PO TBCR: 20.0000 meq | EXTENDED_RELEASE_TABLET | Freq: Every day | ORAL | 1 refills | Status: DC

## 2019-08-21 MED ORDER — ATORVASTATIN CALCIUM 40 MG PO TABS: 40.0000 mg | ORAL_TABLET | Freq: Every day | ORAL | 1 refills | Status: DC

## 2019-08-21 MED ORDER — VALSARTAN-HYDROCHLOROTHIAZIDE 160-12.5 MG PO TABS: 1.0000 | ORAL_TABLET | Freq: Every day | ORAL | 1 refills | Status: DC

## 2019-08-21 MED ORDER — SITAGLIPTIN PHOSPHATE 100 MG PO TABS: 100.0000 mg | ORAL_TABLET | Freq: Every day | ORAL | 1 refills | Status: DC

## 2019-08-21 MED ORDER — METFORMIN HCL ER 750 MG PO TB24: 1500.0000 mg | ORAL_TABLET | Freq: Every day | ORAL | 1 refills | Status: DC

## 2019-08-21 MED ORDER — GLIPIZIDE 5 MG PO TABS: 5.0000 mg | ORAL_TABLET | Freq: Two times a day (BID) | ORAL | 1 refills | Status: DC

## 2019-08-21 NOTE — Progress Notes (Signed)
Name: Jason Farrell.   MRN: [id number]    DOB: Nov 30, 1940   Date:08/21/2019       Progress Note  Subjective  Chief Complaint  Chief Complaint  Patient presents with  . Medication Refill    6 month F/U  . Diabetes    Averages-117  . Hypertension  . Morbid Obesity  . Sleep Apnea  . Senile purpura  . SVT    HPI  Meningioma: he had a partial resection done at [facility] by Dr. Lacinda Axon in 2020, he states he has been doing well, since last visit with Dr. Lacinda Axon he states his memory problems have resolved, no longer having confusion, he denies headaches, no blurred or double vision  DMII: he was diagnosed many years ago,he wastaking Januvia, Metformin and Glipizide, glucose at home has been around 120's. He denies hypoglycemic episode, never below 95  Hedenies polyphagia or polydipsia. He states blurred vision has he states eye exam is up to date   Morbid obesity: based on obesity with multiple risk factors.weight is stable now, he lost weight when hospitalized in 2020 but gradually gaining it back   HTN: He had tachycardia and placed on amiodarone that was recently filled by Dr. Rockey Situ, he is on Toprol XL 50 mg BID , also on amlodipine , lisinopril hctz. No side effects of medication. BP at home around 140's, he is only taking lisinopril hctz once a day, we will change to valsaratan hctz and monitor.   COPD with history of tobacco use: he denies daily cough, mild SOB with moderate activity, no wheezing. Quit smoking in 1996, after MI.Unchanged   S/p MI and and history of stent placement: sees Dr. 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 Rockey Situ yearly   Patient Active Problem List   Diagnosis Date Noted  . Prolonged QT interval   . Hypomagnesemia   . PRES (posterior reversible  encephalopathy syndrome) 08/30/2018  . Hypoalbuminemia due to protein-calorie malnutrition ([place])   . Hypokalemia   . Encephalopathy   . SVT (supraventricular tachycardia) ([place])   . Meningioma ([place])   . Nontraumatic intraventricular intracerebral hemorrhage ([place])   . Status post craniotomy 08/12/2018  . Senile purpura ([place]) 02/21/2018  . Type 2 diabetes mellitus with microalbuminuria, without long-term current use of insulin ([place]) 02/21/2018  . Type 2 diabetes mellitus without complications ([place]) [id number]  . 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 Obstructive sleep apnea on CPAP 12/09/2015  . Arthritis, degenerative 05/06/2015  . Gastro-esophageal reflux disease without esophagitis 05/06/2015  . CAD in native artery 01/25/2015  . Diabetes mellitus type 2 in obese ([place]) 08/16/2014  . Hyperlipidemia 08/16/2014  . Essential hypertension 08/16/2014  . Morbid obesity ([place]) 08/16/2014  . Stopped smoking with greater than 30 pack year history 08/16/2014  . COLD (chronic obstructive lung disease) ([place]) 08/16/2014    Past Surgical History:  Procedure Laterality Date  . CARDIAC CATHETERIZATION  2006   stent placement  . COLONOSCOPY    . CORONARY ANGIOPLASTY WITH STENT PLACEMENT  2006   stent x 2   . CORONARY ANGIOPLASTY WITH STENT PLACEMENT  1996   stent x 3   . CRANIOTOMY  08/12/2018  . FOOT SURGERY Right   . HALLUX VALGUS CHEILECTOMY Right 02/25/2015   Procedure: HALLUX VALGUS CHEILECTOMY;  Surgeon:  [name], DPM;  Location: [facility] ORS;  Service: Podiatry;  Laterality: Right;    Family History  Problem Relation Age of Onset  . Hyperlipidemia Father   . Hypertension Father   . Alcohol abuse Father   . Cancer Sister        lung  . Hypertension Sister   . Heart disease Sister   . Hyperlipidemia Sister   . COPD Sister      Current Outpatient Medications:  .  acetaminophen (TYLENOL) 325 MG tablet, Take 1-2 tablets (325-650 mg total) by mouth every 4 (four) hours as needed  for mild pain., Disp: , Rfl:  .  amiodarone (PACERONE) 200 MG tablet, Take 1 tablet (200 mg total) by mouth daily., Disp: 90 tablet, Rfl: 0 .  amLODipine (NORVASC) 2.5 MG tablet, Take 1 tablet (2.5 mg total) by mouth daily., Disp: 90 tablet, Rfl: 1 .  aspirin 81 MG tablet, Take 81 mg by mouth daily., Disp: , Rfl:  .  atorvastatin (LIPITOR) 40 MG tablet, Take 1 tablet (40 mg total) by mouth daily., Disp: 90 tablet, Rfl: 1 .  Cholecalciferol (VITAMIN D) 50 MCG (2000 UT) tablet, Take 2,000 Units by mouth daily., Disp: , Rfl:  .  docusate sodium (COLACE) 100 MG capsule, Take 100 mg by mouth 2 (two) times daily. Pt takes every other day, Disp: , Rfl:  .  glipiZIDE (GLUCOTROL) 5 MG tablet, Take 1 tablet (5 mg total) by mouth 2 (two) times daily before a meal. Half to one, Disp: 180 tablet, Rfl: 1 .  glucose blood test strip, Use as instructed, Disp: 100 each, Rfl: 12 .  magnesium oxide (MAG-OX) 400 (241.3 Mg) MG tablet, Take 1 tablet (400 mg total) by mouth 2 (two) times daily., Disp: 60 tablet, Rfl: 0 .  metFORMIN (GLUCOPHAGE-XR) 750 MG 24 hr tablet, Take 2 tablets (1,500 mg total) by mouth daily with breakfast., Disp: 180 tablet, Rfl: 1 .  metoprolol succinate (TOPROL-XL) 50 MG 24 hr tablet, Take 1 tablet (50 mg total) by mouth daily. Take with or immediately following a meal., Disp: 90 tablet, Rfl: 1 .  Multiple Vitamins-Minerals (CENTRUM SILVER PO), Take 1 tablet by mouth daily. , Disp: , Rfl:  .  omeprazole (PRILOSEC) 40 MG capsule, Take 1 capsule (40 mg total) by mouth daily., Disp: 90 capsule, Rfl: 1 .  potassium chloride SA (KLOR-CON) 20 MEQ tablet, Take 1 tablet (20 mEq total) by mouth daily., Disp: 90 tablet, Rfl: 1 .  sitaGLIPtin (JANUVIA) 100 MG tablet, Take 1 tablet (100 mg total) by mouth daily., Disp: 90 tablet, Rfl: 1 .  valsartan-hydrochlorothiazide (DIOVAN HCT) 160-12.5 MG tablet, Take 1 tablet by mouth daily. In place of lisinopril hctz for BP, Disp: 90 tablet, Rfl: 1  No Known  Allergies  I personally reviewed active problem list, medication list, allergies, family history, social history with the patient/caregiver today.   ROS  Constitutional: Negative for fever or weight change.  Respiratory: Negative for cough and shortness of breath.   Cardiovascular: Negative for chest pain or palpitations.  Gastrointestinal: Negative for abdominal pain, no bowel changes.  Musculoskeletal: Negative for gait problem or joint swelling.  Skin: Negative for rash.  Neurological: Negative for dizziness or headache.  No other specific complaints in a complete review of systems (except as listed in HPI above).  Objective    Vitals:   08/21/19 0950  BP: (!) 160/70  Pulse: 62  Resp: 16  Temp: (!) 97.5 F (36.4 C)  TempSrc: Temporal  SpO2: 97%  Weight: 220 lb 12.8 oz (100.2 kg)  Height: 5' 6.5" (1.689 m)    Body mass index is 35.1 kg/m.  Physical Exam  Constitutional: Patient appears well-developed and well-nourished. Obese  No distress.  HEENT: head atraumatic, normocephalic, pupils equal and reactive to light Cardiovascular: Normal rate, regular rhythm and normal heart sounds.  No murmur heard. No BLE edema. Pulmonary/Chest: Effort normal and breath sounds normal. No respiratory distress. Abdominal: Soft.  There is no tenderness. Psychiatric: Patient has a normal mood and affect. behavior is normal. Judgment and thought content normal.  Recent Results (from the past 2160 hour(s))  POCT HgB A1C     Status: Normal   Collection Time: 08/21/19  9:55 AM  Result Value Ref Range   Hemoglobin A1C     HbA1c POC (<> result, manual entry)     HbA1c, POC (prediabetic range)     HbA1c, POC (controlled diabetic range) 6.0 0.0 - 7.0 %    Diabetic Foot Exam: Diabetic Foot Exam - Simple   Simple Foot Form Diabetic Foot exam was performed with the following findings: Yes 08/21/2019 10:56 AM  Visual Inspection See comments: Yes Sensation Testing Intact to touch and  monofilament testing bilaterally: Yes Pulse Check Posterior Tibialis and Dorsalis pulse intact bilaterally: Yes Comments Thick nails, dry feet      PHQ2/9: Depression screen [facility] 2/9 08/21/2019 04/17/2019 02/17/2019 10/17/2018 07/21/2018  Decreased Interest 0 0 0 0 0  Down, Depressed, Hopeless 0 0 0 0 0  PHQ - 2 Score 0 0 0 0 0  Altered sleeping 0 - 0 0 1  Tired, decreased energy 0 - 2 0 3  Change in appetite 0 - 0 0 1  Feeling bad or failure about yourself  0 - 0 0 0  Trouble concentrating 0 - 0 0 0  Moving slowly or fidgety/restless 0 - 0 0 0  Suicidal thoughts 0 - 0 0 0  PHQ-9 Score 0 - 2 0 5  Difficult doing work/chores Not difficult at all - Not difficult at all - Not difficult at all  Some recent data might be hidden    phq 9 is negative   Fall Risk: Fall Risk  08/21/2019 04/17/2019 02/17/2019 10/17/2018 07/21/2018  Falls in the past year? 0 0 0 0 0  Number falls in past yr: 0 0 0 0 0  Injury with Fall? 0 0 0 0 0  Risk for fall due to : - History of fall(s) - - -  Risk for fall due to: Comment - - - - -  Follow up - Falls prevention discussed - - -    Functional Status Survey: Is the patient deaf or have difficulty hearing?: No Does the patient have difficulty seeing, even when wearing glasses/contacts?: Yes Does the patient have difficulty concentrating, remembering, or making decisions?: No Does the patient have difficulty walking or climbing stairs?: No Does the patient have difficulty dressing or bathing?: No Does the patient have difficulty doing errands alone such as visiting a doctor's office or shopping?: No    Assessment & Plan  1. Diabetes mellitus type 2 in obese (HCC)  - POCT HgB A1C - metFORMIN (GLUCOPHAGE-XR) 750 MG 24 hr tablet; Take 2 tablets (1,500 mg total) by mouth daily with breakfast.  Dispense: 180 tablet; Refill: 1 - sitaGLIPtin (JANUVIA) 100 MG tablet; Take 1 tablet (100 mg total) by mouth daily.  Dispense: 90 tablet; Refill: 1 - glipiZIDE (GLUCOTROL)  5 MG tablet; Take  1 tablet (5 mg total) by mouth 2 (two) times daily before a meal. Half to one  Dispense: 180 tablet; Refill: 1  2. Anemia, unspecified type  - CBC with Differential/Platelet - Iron, TIBC and Ferritin Panel  It started after surgery, if not back to normal, hemoccult cards   3. Dyslipidemia associated with type 2 diabetes mellitus (HCC)  - metFORMIN (GLUCOPHAGE-XR) 750 MG 24 hr tablet; Take 2 tablets (1,500 mg total) by mouth daily with breakfast.  Dispense: 180 tablet; Refill: 1 - sitaGLIPtin (JANUVIA) 100 MG tablet; Take 1 tablet (100 mg total) by mouth daily.  Dispense: 90 tablet; Refill: 1 - atorvastatin (LIPITOR) 40 MG tablet; Take 1 tablet (40 mg total) by mouth daily.  Dispense: 90 tablet; Refill: 1  4. Morbid obesity ([place])  Discussed with the patient the risk posed by an increased BMI. Discussed importance of portion control, calorie counting and at least 150 minutes of physical activity weekly. Avoid sweet beverages and drink more water. Eat at least 6 servings of fruit and vegetables daily  bMI above 35 with co-morbidities   5. Essential hypertension  - metoprolol succinate (TOPROL-XL) 50 MG 24 hr tablet; Take 1 tablet (50 mg total) by mouth daily. Take with or immediately following a meal.  Dispense: 90 tablet; Refill: 1 - potassium chloride SA (KLOR-CON) 20 MEQ tablet; Take 1 tablet (20 mEq total) by mouth daily.  Dispense: 90 tablet; Refill: 1 - amLODipine (NORVASC) 2.5 MG tablet; Take 1 tablet (2.5 mg total) by mouth daily.  Dispense: 90 tablet; Refill: 1 - valsartan-hydrochlorothiazide (DIOVAN HCT) 160-12.5 MG tablet; Take 1 tablet by mouth daily. In place of lisinopril hctz for BP  Dispense: 90 tablet; Refill: 1  6. Senile purpura (HCC)  stable  7. Hypertension associated with diabetes ([place])  - metoprolol succinate (TOPROL-XL) 50 MG 24 hr tablet; Take 1 tablet (50 mg total) by mouth daily. Take with or immediately following a meal.  Dispense: 90  tablet; Refill: 1 - amLODipine (NORVASC) 2.5 MG tablet; Take 1 tablet (2.5 mg total) by mouth daily.  Dispense: 90 tablet; Refill: 1 - valsartan-hydrochlorothiazide (DIOVAN HCT) 160-12.5 MG tablet; Take 1 tablet by mouth daily. In place of lisinopril hctz for BP  Dispense: 90 tablet; Refill: 1  8. Obstructive sleep apnea on CPAP   9. Chronic bronchitis, unspecified chronic bronchitis type ([place])   10. Benign meningioma ([place])   11. Type 2 diabetes mellitus with microalbuminuria, without long-term current use of insulin (HCC)  - metFORMIN (GLUCOPHAGE-XR) 750 MG 24 hr tablet; Take 2 tablets (1,500 mg total) by mouth daily with breakfast.  Dispense: 180 tablet; Refill: 1 - sitaGLIPtin (JANUVIA) 100 MG tablet; Take 1 tablet (100 mg total) by mouth daily.  Dispense: 90 tablet; Refill: 1 - glipiZIDE (GLUCOTROL) 5 MG tablet; Take 1 tablet (5 mg total) by mouth 2 (two) times daily before a meal. Half to one  Dispense: 180 tablet; Refill: 1 - valsartan-hydrochlorothiazide (DIOVAN HCT) 160-12.5 MG tablet; Take 1 tablet by mouth daily. In place of lisinopril hctz for BP  Dispense: 90 tablet; Refill: 1  12. Gastroesophageal reflux disease  - omeprazole (PRILOSEC) 40 MG capsule; Take 1 capsule (40 mg total) by mouth daily.  Dispense: 90 capsule; Refill: 1

## 2019-08-22 LAB — CBC WITH DIFFERENTIAL/PLATELET
Absolute Monocytes: 761 cells/uL (ref 200–950)
Basophils Absolute: 113 cells/uL (ref 0–200)
Basophils Relative: 1.4 %
Eosinophils Absolute: 267 cells/uL (ref 15–500)
Eosinophils Relative: 3.3 %
HCT: 38.5 % (ref 38.5–50.0)
Hemoglobin: 12.2 g/dL — ABNORMAL LOW (ref 13.2–17.1)
Lymphs Abs: 1507 cells/uL (ref 850–3900)
MCH: 24.4 pg — ABNORMAL LOW (ref 27.0–33.0)
MCHC: 31.7 g/dL — ABNORMAL LOW (ref 32.0–36.0)
MCV: 77 fL — ABNORMAL LOW (ref 80.0–100.0)
MPV: 10.7 fL (ref 7.5–12.5)
Monocytes Relative: 9.4 %
Neutro Abs: 5451 cells/uL (ref 1500–7800)
Neutrophils Relative %: 67.3 %
Platelets: 364 10*3/uL (ref 140–400)
RBC: 5 10*6/uL (ref 4.20–5.80)
RDW: 16.2 % — ABNORMAL HIGH (ref 11.0–15.0)
Total Lymphocyte: 18.6 %
WBC: 8.1 10*3/uL (ref 3.8–10.8)

## 2019-08-22 LAB — IRON,TIBC AND FERRITIN PANEL
%SAT: 9 % (calc) — ABNORMAL LOW (ref 20–48)
Ferritin: 7 ng/mL — ABNORMAL LOW (ref 24–380)
Iron: 39 ug/dL — ABNORMAL LOW (ref 50–180)
TIBC: 426 mcg/dL (calc) — ABNORMAL HIGH (ref 250–425)

## 2019-09-02 ENCOUNTER — Other Ambulatory Visit: Payer: Self-pay | Admitting: Family Medicine

## 2019-09-02 ENCOUNTER — Other Ambulatory Visit: Payer: Self-pay

## 2019-09-02 DIAGNOSIS — D649 Anemia, unspecified: Secondary | ICD-10-CM

## 2019-09-02 LAB — POC HEMOCCULT BLD/STL (HOME/3-CARD/SCREEN)
Card #2 Fecal Occult Blod, POC: NEGATIVE
Card #3 Fecal Occult Blood, POC: NEGATIVE
Fecal Occult Blood, POC: NEGATIVE

## 2019-09-05 ENCOUNTER — Ambulatory Visit: Payer: Medicare Other | Attending: Internal Medicine

## 2019-09-05 DIAGNOSIS — Z23 Encounter for immunization: Secondary | ICD-10-CM | POA: Insufficient documentation

## 2019-09-05 NOTE — Progress Notes (Signed)
   [id number]  Name:  Ezri Kuni.    MRN: [id number] DOB: 1940-12-15  09/05/2019  Mr. Donohoe was observed post Covid-19 immunization for 15 minutes without incidence. He was provided with Vaccine Information Sheet and instruction to access the V-Safe system.   Mr. Cepin was instructed to call 911 with any severe reactions post vaccine: Marland Kitchen Difficulty breathing  . Swelling of your face and throat  . A fast heartbeat  . A bad rash all over your body  . Dizziness and weakness    Immunizations Administered    Name Date Dose VIS Date Route   Pfizer COVID-19 Vaccine 09/05/2019  9:26 AM 0.3 mL 06/26/2019 Intramuscular   Manufacturer: [place]   Lot: [id number]   [place]: [id number]

## 2019-09-29 ENCOUNTER — Ambulatory Visit: Payer: Medicare Other | Attending: Internal Medicine

## 2019-09-29 DIAGNOSIS — Z23 Encounter for immunization: Secondary | ICD-10-CM

## 2019-09-29 NOTE — Progress Notes (Signed)
   [id number] [facility]  Name:  Ulises Bells.    MRN: [id number] DOB: Feb 26, 1941  09/29/2019  Mr. Gillis was observed post Covid-19 immunization for 15 minutes without incident. He was provided with Vaccine Information Sheet and instruction to access the V-Safe system.   Mr. Caranci was instructed to call 911 with any severe reactions post vaccine: Marland Kitchen Difficulty breathing  . Swelling of face and throat  . A fast heartbeat  . A bad rash all over body  . Dizziness and weakness   Immunizations Administered    Name Date Dose VIS Date Route   Pfizer COVID-19 Vaccine 09/29/2019  8:22 AM 0.3 mL 06/26/2019 Intramuscular   Manufacturer: [place]   Lot: [id number]   [place]: [id number]

## 2019-10-04 NOTE — Progress Notes (Signed)
Date:  10/05/2019   ID:  Jason Farrell., DOB [birth year], MRN [id number]  Patient Location:  [place] [address]   Provider location:   [facility], [place] office  PCP:  Steele Sizer, MD  Cardiologist:  Arvid Right [facility]  Chief Complaint  Patient presents with  . 3 month F/U; Meds verbally reviewed with patient.    History of Present Illness:    Jason Farrell. is a 79 y.o. 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  In follow-up he reports that he is doing well Active, no regular exercise program Wife has been strict with his diet, He did lose weight after surgery early 2020 20 pounds Feels he is put the weight back on  Denies any complications following surgery last year No recurrent tachycardia concerning for SVT  Activity limited by chronic joint issues, knees, hips Denies chest pain or shortness of breath on exertion Little bit of tightness when he first wakes up in the morning but not consistent, no recurrent symptoms in the day  Lab work reviewed with him HAB1C 6.0, previously 8.7 Total chol 110, LDL 48  EKG personally reviewed by myself on todays visit NSR rate 58 bpm, RBBB  Other past medical history reviewed In the hospital 09/2018 Hospital records reviewed with the patient in detail intermittent confusion with difficulty speaking and visual changes since 06/2018 work-up revealing large meningioma at level of cerebral aqueduct with vasogenic edema posterior medial temporal lobes and obstructive hydrocephalus.    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 Marylu Lund.   Postop course  mental status changes with bouts of lethargy, confusion, problems with sleep-wake disruption with delirium, loss of bilateral vision as well as issues with leukocytosis and SVT. Amiodarone and B-blocker for SVT  Sister with lung cancer now in remission Reports he stopped smoking 1996  He stopped smoking in 1996 after his MI . Smoked 38 yrs   Prior CV studies:   The following studies were reviewed today:    Past Medical History:  Diagnosis Date  . Coronary artery disease   . Diabetes mellitus without complication ([place])   . Dyslipidemia   . GERD (gastroesophageal reflux disease)   . Hyperlipidemia   . Hypertension   . MI (myocardial infarction) ([place])   . Osteoarthritis    Past Surgical History:  Procedure Laterality Date  . CARDIAC CATHETERIZATION  2006   stent placement  . COLONOSCOPY    . CORONARY ANGIOPLASTY WITH STENT PLACEMENT  2006   stent x 2   . CORONARY ANGIOPLASTY WITH STENT PLACEMENT  1996   stent x 3   . CRANIOTOMY  08/12/2018  . FOOT SURGERY Right   . HALLUX VALGUS CHEILECTOMY Right 02/25/2015   Procedure: HALLUX VALGUS CHEILECTOMY;  Surgeon: [name], DPM;  Location: [facility] ORS;  Service: Podiatry;  Laterality: Right;     Current Meds  Medication Sig  . acetaminophen (TYLENOL) 325 MG tablet Take 1-2 tablets (325-650 mg total) by mouth every 4 (four) hours as needed for mild pain.  Marland Kitchen amiodarone (PACERONE) 200 MG tablet Take 1 tablet (200  mg total) by mouth daily.  Marland Kitchen amLODipine (NORVASC) 2.5 MG tablet Take 1 tablet (2.5 mg total) by mouth daily.  Marland Kitchen aspirin 81 MG tablet Take 81 mg by mouth daily.  Marland Kitchen atorvastatin (LIPITOR) 40 MG tablet Take 1 tablet (40 mg total) by mouth daily.  . Cholecalciferol (VITAMIN D) 50 MCG (2000 UT) tablet Take 2,000 Units by mouth daily.  Marland Kitchen docusate sodium (COLACE) 100 MG capsule Take 100 mg by mouth 2 (two) times daily. Pt takes every other day  . glipiZIDE (GLUCOTROL) 5 MG tablet Take 1  tablet (5 mg total) by mouth 2 (two) times daily before a meal. Half to one (Patient taking differently: Take 2.5 mg by mouth 2 (two) times daily before a meal. Half to one)  . glucose blood test strip Use as instructed  . magnesium oxide (MAG-OX) 400 (241.3 Mg) MG tablet Take 1 tablet (400 mg total) by mouth 2 (two) times daily.  . metFORMIN (GLUCOPHAGE-XR) 750 MG 24 hr tablet Take 2 tablets (1,500 mg total) by mouth daily with breakfast.  . metoprolol succinate (TOPROL-XL) 50 MG 24 hr tablet Take 1 tablet (50 mg total) by mouth daily. Take with or immediately following a meal.  . Multiple Vitamins-Minerals (CENTRUM SILVER PO) Take 1 tablet by mouth daily.   Marland Kitchen omeprazole (PRILOSEC) 40 MG capsule Take 1 capsule (40 mg total) by mouth daily.  . potassium chloride SA (KLOR-CON) 20 MEQ tablet Take 1 tablet (20 mEq total) by mouth daily.  . sitaGLIPtin (JANUVIA) 100 MG tablet Take 1 tablet (100 mg total) by mouth daily.  . valsartan-hydrochlorothiazide (DIOVAN HCT) 160-12.5 MG tablet Take 1 tablet by mouth daily. In place of lisinopril hctz for BP     Allergies:   Patient has no known allergies.   Social History   Tobacco Use  . Smoking status: Former Smoker    Packs/day: 1.50    Years: 35.00    Pack years: 52.50    Types: Cigarettes    Start date: 07/17/1959    Quit date: 08/27/1994    Years since quitting: 25.1  . Smokeless tobacco: Never Used  . Tobacco comment: smoking cessation materials not required  Substance Use Topics  . Alcohol use: Not Currently    Alcohol/week: 0.0 standard drinks  . Drug use: No     Current Outpatient Medications on File Prior to Visit  Medication Sig Dispense Refill  . acetaminophen (TYLENOL) 325 MG tablet Take 1-2 tablets (325-650 mg total) by mouth every 4 (four) hours as needed for mild pain.    Marland Kitchen amiodarone (PACERONE) 200 MG tablet Take 1 tablet (200 mg total) by mouth daily. 90 tablet 0  . amLODipine (NORVASC) 2.5 MG tablet Take 1 tablet (2.5 mg  total) by mouth daily. 90 tablet 1  . aspirin 81 MG tablet Take 81 mg by mouth daily.    Marland Kitchen atorvastatin (LIPITOR) 40 MG tablet Take 1 tablet (40 mg total) by mouth daily. 90 tablet 1  . Cholecalciferol (VITAMIN D) 50 MCG (2000 UT) tablet Take 2,000 Units by mouth daily.    Marland Kitchen docusate sodium (COLACE) 100 MG capsule Take 100 mg by mouth 2 (two) times daily. Pt takes every other day    . glipiZIDE (GLUCOTROL) 5 MG tablet Take 1 tablet (5 mg total) by mouth 2 (two) times daily before a meal. Half to one (Patient taking differently: Take 2.5 mg by mouth 2 (two) times daily before a meal. Half to one) 180 tablet 1  .  glucose blood test strip Use as instructed 100 each 12  . magnesium oxide (MAG-OX) 400 (241.3 Mg) MG tablet Take 1 tablet (400 mg total) by mouth 2 (two) times daily. 60 tablet 0  . metFORMIN (GLUCOPHAGE-XR) 750 MG 24 hr tablet Take 2 tablets (1,500 mg total) by mouth daily with breakfast. 180 tablet 1  . metoprolol succinate (TOPROL-XL) 50 MG 24 hr tablet Take 1 tablet (50 mg total) by mouth daily. Take with or immediately following a meal. 90 tablet 1  . Multiple Vitamins-Minerals (CENTRUM SILVER PO) Take 1 tablet by mouth daily.     Marland Kitchen omeprazole (PRILOSEC) 40 MG capsule Take 1 capsule (40 mg total) by mouth daily. 90 capsule 1  . potassium chloride SA (KLOR-CON) 20 MEQ tablet Take 1 tablet (20 mEq total) by mouth daily. 90 tablet 1  . sitaGLIPtin (JANUVIA) 100 MG tablet Take 1 tablet (100 mg total) by mouth daily. 90 tablet 1  . valsartan-hydrochlorothiazide (DIOVAN HCT) 160-12.5 MG tablet Take 1 tablet by mouth daily. In place of lisinopril hctz for BP 90 tablet 1   No current facility-administered medications on file prior to visit.     Family Hx: The patient's family history includes Alcohol abuse in his father; COPD in his sister; Cancer in his sister; Heart disease in his sister; Hyperlipidemia in his father and sister; Hypertension in his father and sister.  ROS:   Please see  the history of present illness.    Review of Systems  Constitutional: Negative.   Respiratory: Negative.   Cardiovascular: Negative.   Gastrointestinal: Negative.   Musculoskeletal: Negative.   Neurological: Negative.   Psychiatric/Behavioral: Negative.   All other systems reviewed and are negative.     Labs/Other Tests and Data Reviewed:    Recent Labs: 02/17/2019: ALT 12; BUN 13; Creat 0.89; Potassium 4.5; Sodium 143 08/21/2019: Hemoglobin 12.2; Platelets 364   Recent Lipid Panel Lab Results  Component Value Date/Time   CHOL 110 02/17/2019 10:44 AM   CHOL 126 11/07/2015 10:06 AM   TRIG 82 02/17/2019 10:44 AM   HDL 46 02/17/2019 10:44 AM   HDL 47 11/07/2015 10:06 AM   CHOLHDL 2.4 02/17/2019 10:44 AM   LDLCALC 48 02/17/2019 10:44 AM    Wt Readings from Last 3 Encounters:  10/05/19 221 lb 2 oz (100.3 kg)  08/21/19 220 lb 12.8 oz (100.2 kg)  04/17/19 214 lb (97.1 kg)     Exam:    BP (!) 142/70 (BP Location: Left Arm, Patient Position: Sitting, Cuff Size: Normal)   Pulse (!) 58   Ht 5\' 6"  (1.676 m)   Wt 221 lb 2 oz (100.3 kg)   SpO2 96%   BMI 35.69 kg/m    Constitutional:  oriented to person, place, and time. No distress.  HENT:  Head: Grossly normal Eyes:  no discharge. No scleral icterus.  Neck: No JVD, no carotid bruits  Cardiovascular: Regular rate and rhythm, no murmurs appreciated Pulmonary/Chest: Clear to auscultation bilaterally, no wheezes or rails Abdominal: Soft.  no distension.  no tenderness.  Musculoskeletal: Normal range of motion Neurological:  normal muscle tone. Coordination normal. No atrophy Skin: Skin warm and dry Psychiatric: normal affect, pleasant   ASSESSMENT & PLAN:     Coronary artery disease involving native coronary artery of native heart without angina pectoris - Currently with no symptoms of angina. No further workup at this time. Continue current medication regimen.  Essential hypertension - Borderline elevated today from  long walk into the office,  no medication changes made at this time Suggested he monitor pressures at home  Mixed hyperlipidemia Radical change in his diet resulting in drop in hemoglobin A1c A1c down to 6 Cholesterol well controlled, no changes to his medications  Controlled type 2 diabetes mellitus without complication, without long-term current use of insulin (HCC) Hemoglobin A1c down to 6.0 He reports wife has changed his diet Recommended he start walking program given some leg fatigue on exertion  Class 2 obesity due to excess calories without serious comorbidity in adult, unspecified BMI Weight has trended back up, recommended he continue to work on his diet, walking program  Centrilobular emphysema ([place]) 38 years of smoking Chronic mild shortness of breath on heavy exertion Suggested walking program, he does not feel that he needs a albuterol inhaler  Obstructive sleep apnea on CPAP Stable, on CPAP  F/u 1 year   Total encounter time more than 25 minutes  Greater than 50% was spent in counseling and coordination of care with the patient    Signed, Ida Rogue, MD  10/05/2019 8:09 AM    [place] Office [address] [place] [address], [place], [place] [address]

## 2019-10-05 ENCOUNTER — Ambulatory Visit (INDEPENDENT_AMBULATORY_CARE_PROVIDER_SITE_OTHER): Payer: Medicare Other | Admitting: Cardiovascular Disease

## 2019-10-05 ENCOUNTER — Other Ambulatory Visit: Payer: Self-pay

## 2019-10-05 ENCOUNTER — Encounter: Payer: Self-pay | Admitting: Cardiovascular Disease

## 2019-10-05 VITALS — BP 142/70 | HR 58 | Ht 66.0 in | Wt 221.1 lb

## 2019-10-05 DIAGNOSIS — J432 Centrilobular emphysema: Secondary | ICD-10-CM | POA: Diagnosis not present

## 2019-10-05 DIAGNOSIS — G4733 Obstructive sleep apnea (adult) (pediatric): Secondary | ICD-10-CM | POA: Diagnosis not present

## 2019-10-05 DIAGNOSIS — I10 Essential (primary) hypertension: Secondary | ICD-10-CM

## 2019-10-05 DIAGNOSIS — E669 Obesity, unspecified: Secondary | ICD-10-CM

## 2019-10-05 DIAGNOSIS — I25118 Atherosclerotic heart disease of native coronary artery with other forms of angina pectoris: Secondary | ICD-10-CM

## 2019-10-05 DIAGNOSIS — E119 Type 2 diabetes mellitus without complications: Secondary | ICD-10-CM

## 2019-10-05 DIAGNOSIS — Z9989 Dependence on other enabling machines and devices: Secondary | ICD-10-CM

## 2019-10-05 DIAGNOSIS — E1169 Type 2 diabetes mellitus with other specified complication: Secondary | ICD-10-CM | POA: Diagnosis not present

## 2019-10-05 NOTE — Patient Instructions (Signed)

## 2019-10-16 ENCOUNTER — Other Ambulatory Visit: Payer: Self-pay

## 2019-10-16 MED ORDER — AMIODARONE HCL 200 MG PO TABS: 200.0000 mg | ORAL_TABLET | Freq: Every day | ORAL | 3 refills | Status: DC

## 2019-10-16 NOTE — Telephone Encounter (Signed)
*  STAT* If patient is at the pharmacy, call can be transferred to refill team.   1. Which medications need to be refilled? (please list name of each medication and dose if known) Amiodarone  2. Which pharmacy/location (including street and city if local pharmacy) is medication to be sent to?[facility]  3. Do they need a 30 day or 90 day supply? [place]

## 2019-12-02 ENCOUNTER — Encounter: Payer: Self-pay | Admitting: Family Medicine

## 2019-12-02 ENCOUNTER — Other Ambulatory Visit: Payer: Self-pay

## 2019-12-02 ENCOUNTER — Ambulatory Visit (INDEPENDENT_AMBULATORY_CARE_PROVIDER_SITE_OTHER): Payer: Medicare Other | Admitting: Family Medicine

## 2019-12-02 VITALS — BP 132/80 | HR 62 | Temp 97.3°F | Resp 18 | Ht 66.0 in | Wt 221.7 lb

## 2019-12-02 DIAGNOSIS — D509 Iron deficiency anemia, unspecified: Secondary | ICD-10-CM

## 2019-12-02 DIAGNOSIS — R809 Proteinuria, unspecified: Secondary | ICD-10-CM

## 2019-12-02 DIAGNOSIS — Z9989 Dependence on other enabling machines and devices: Secondary | ICD-10-CM

## 2019-12-02 DIAGNOSIS — E669 Obesity, unspecified: Secondary | ICD-10-CM | POA: Diagnosis not present

## 2019-12-02 DIAGNOSIS — E785 Hyperlipidemia, unspecified: Secondary | ICD-10-CM

## 2019-12-02 DIAGNOSIS — D692 Other nonthrombocytopenic purpura: Secondary | ICD-10-CM | POA: Diagnosis not present

## 2019-12-02 DIAGNOSIS — E1169 Type 2 diabetes mellitus with other specified complication: Secondary | ICD-10-CM | POA: Diagnosis not present

## 2019-12-02 DIAGNOSIS — I25118 Atherosclerotic heart disease of native coronary artery with other forms of angina pectoris: Secondary | ICD-10-CM

## 2019-12-02 DIAGNOSIS — G4733 Obstructive sleep apnea (adult) (pediatric): Secondary | ICD-10-CM | POA: Diagnosis not present

## 2019-12-02 DIAGNOSIS — K219 Gastro-esophageal reflux disease without esophagitis: Secondary | ICD-10-CM | POA: Diagnosis not present

## 2019-12-02 DIAGNOSIS — I10 Essential (primary) hypertension: Secondary | ICD-10-CM

## 2019-12-02 DIAGNOSIS — I152 Hypertension secondary to endocrine disorders: Secondary | ICD-10-CM

## 2019-12-02 DIAGNOSIS — E1159 Type 2 diabetes mellitus with other circulatory complications: Secondary | ICD-10-CM

## 2019-12-02 DIAGNOSIS — E1129 Type 2 diabetes mellitus with other diabetic kidney complication: Secondary | ICD-10-CM

## 2019-12-02 LAB — POCT GLYCOSYLATED HEMOGLOBIN (HGB A1C): Hemoglobin A1C: 6.5 % — AB (ref 4.0–5.6)

## 2019-12-02 MED ORDER — VALSARTAN-HYDROCHLOROTHIAZIDE 160-12.5 MG PO TABS: 1.0000 | ORAL_TABLET | Freq: Every day | ORAL | 1 refills | Status: DC

## 2019-12-02 MED ORDER — ATORVASTATIN CALCIUM 40 MG PO TABS: 40.0000 mg | ORAL_TABLET | Freq: Every day | ORAL | 1 refills | Status: DC

## 2019-12-02 MED ORDER — FERROUS SULFATE 325 (65 FE) MG PO TABS: 325.0000 mg | ORAL_TABLET | Freq: Two times a day (BID) | ORAL | 1 refills | Status: AC

## 2019-12-02 MED ORDER — OMEPRAZOLE 40 MG PO CPDR: 40.0000 mg | DELAYED_RELEASE_CAPSULE | Freq: Every day | ORAL | 1 refills | Status: DC

## 2019-12-02 MED ORDER — GLIPIZIDE ER 2.5 MG PO TB24: 2.5000 mg | ORAL_TABLET | Freq: Every day | ORAL | 1 refills | Status: DC

## 2019-12-02 MED ORDER — METOPROLOL SUCCINATE ER 50 MG PO TB24: 50.0000 mg | ORAL_TABLET | Freq: Every day | ORAL | 1 refills | Status: DC

## 2019-12-02 MED ORDER — AMLODIPINE BESYLATE 2.5 MG PO TABS: 2.5000 mg | ORAL_TABLET | Freq: Every day | ORAL | 1 refills | Status: DC

## 2019-12-02 MED ORDER — SITAGLIPTIN PHOSPHATE 100 MG PO TABS: 100.0000 mg | ORAL_TABLET | Freq: Every day | ORAL | 1 refills | Status: DC

## 2019-12-02 MED ORDER — METFORMIN HCL ER 750 MG PO TB24: 1500.0000 mg | ORAL_TABLET | Freq: Every day | ORAL | 1 refills | Status: DC

## 2019-12-02 MED ORDER — POTASSIUM CHLORIDE CRYS ER 20 MEQ PO TBCR: 20.0000 meq | EXTENDED_RELEASE_TABLET | Freq: Every day | ORAL | 1 refills | Status: DC

## 2019-12-02 NOTE — Progress Notes (Signed)
Name: Jason Farrell.   MRN: [id number]    DOB: 1940/09/16   Date:12/02/2019       Progress Note  Subjective  Chief Complaint  Chief Complaint  Patient presents with  . Diabetes    3 month follow up  . Hyperlipidemia  . Hypertension  . Gastroesophageal Reflux    HPI  Meningioma: he had a partial resection done at [facility] by Dr. Lacinda Axon in 2020, he states he has been doing well, since last visit with Dr. Lacinda Axon he states his memory problems have resolved, no longer having confusion, he denies headaches, no blurred or double vision. He has follow up with Dr. Lacinda Axon coming up   DMII: he was diagnosed many years ago,he is taking Januvia, Metformin and Glipizide, glucose at home has been around 120's. He denies hypoglycemic episode, never below 95 Hedenies polyphagia or polydipsia. He states blurred vision has he states eye exam is up to date and we will obtain a copy. Last A1C was 6 % last visit, we changed glipizide from 5 mg to half pill bid, he states the pill becomes powder and we will change today to glipizide 2.5 mg XL. He has dyslipidemia and takes statin therapy   Morbid obesity: based on BMI above 35 with multiple risk factors.weight is stable now, he lost weight when hospitalized in 2020 but gradually gained it back. He has DM, HTN, dyslipidemia, OA  HTN: He had tachycardia and placed on amiodarone that was recently filled by Dr. Rockey Situ, he is on Toprol XL 50 mg BID , also on amlodipine since 08/2019 changed from lisinopril hctz to valsartan hctz and bp is better today  No side effects of medication.    COPD with history of tobacco use: he denies daily cough,mild SOB with moderate activity, no wheezing.Quit smoking in 1996, after MI.He never had to use inhalers, no flares in a while   S/p MI and and history of stent placement: sees Dr. Rockey Situ , denies chest pain or palpitation last visit 10/2019  OSA: wearing CPAP every night, denies snoring when wearing the cpap    Senile purpura: he worked in his shop yesterday and has a lot of bruises on both arms toda  SVT: doing well on amiodarone and beta blocker, he sees Dr. Rockey Situ yearly, unchanged    Patient Active Problem List   Diagnosis Date Noted  . Prolonged QT interval   . Hypomagnesemia   . PRES (posterior reversible encephalopathy syndrome) 08/30/2018  . Hypoalbuminemia due to protein-calorie malnutrition ([place])   . Hypokalemia   . Encephalopathy   . SVT (supraventricular tachycardia) ([place])   . Meningioma ([place])   . 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 Status post craniotomy 08/12/2018  . Intracranial mass 08/08/2018  . Senile purpura ([place]) 02/21/2018  . Type 2 diabetes mellitus with microalbuminuria, without long-term current use of insulin ([place]) 02/21/2018  . Type 2 diabetes mellitus without complications ([place]) [id number]  . 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 Obstructive sleep apnea on CPAP 12/09/2015  . Arthritis, degenerative 05/06/2015  . Gastro-esophageal reflux disease without esophagitis 05/06/2015  . CAD in native artery 01/25/2015  . Diabetes mellitus type 2 in obese ([place]) 08/16/2014  . Hyperlipidemia 08/16/2014  . Essential hypertension 08/16/2014  . Morbid obesity ([place]) 08/16/2014  . Stopped smoking with greater than 30 pack year history 08/16/2014  . COLD (chronic obstructive lung disease) ([place]) 08/16/2014    Past Surgical History:  Procedure Laterality Date  . CARDIAC CATHETERIZATION  2006  stent placement  . COLONOSCOPY    . CORONARY ANGIOPLASTY WITH STENT PLACEMENT  2006   stent x 2   . CORONARY ANGIOPLASTY WITH STENT PLACEMENT  1996   stent x 3   . CRANIOTOMY  08/12/2018  . FOOT SURGERY Right   . HALLUX VALGUS CHEILECTOMY Right 02/25/2015   Procedure: HALLUX VALGUS CHEILECTOMY;  Surgeon: [name], DPM;  Location: [facility] ORS;  Service: Podiatry;  Laterality: Right;    Family History  Problem Relation Age of Onset  .  Hyperlipidemia Father   . Hypertension Father   . Alcohol abuse Father   . Cancer Sister        lung  . Hypertension Sister   . Heart disease Sister   . Hyperlipidemia Sister   . COPD Sister     Social History   Tobacco Use  . Smoking status: Former Smoker    Packs/day: 1.50    Years: 35.00    Pack years: 52.50    Types: Cigarettes    Start date: 07/17/1959    Quit date: 08/27/1994    Years since quitting: 25.2  . Smokeless tobacco: Never Used  . Tobacco comment: smoking cessation materials not required  Substance Use Topics  . Alcohol use: Not Currently    Alcohol/week: 0.0 standard drinks     Current Outpatient Medications:  .  acetaminophen (TYLENOL) 325 MG tablet, Take 1-2 tablets (325-650 mg total) by mouth every 4 (four) hours as needed for mild pain., Disp: , Rfl:  .  amiodarone (PACERONE) 200 MG tablet, Take 1 tablet (200 mg total) by mouth daily., Disp: 90 tablet, Rfl: 3 .  amLODipine (NORVASC) 2.5 MG tablet, Take 1 tablet (2.5 mg total) by mouth daily., Disp: 90 tablet, Rfl: 1 .  aspirin 81 MG tablet, Take 81 mg by mouth daily., Disp: , Rfl:  .  atorvastatin (LIPITOR) 40 MG tablet, Take 1 tablet (40 mg total) by mouth daily., Disp: 90 tablet, Rfl: 1 .  Cholecalciferol (VITAMIN D) 50 MCG (2000 UT) tablet, Take 2,000 Units by mouth daily., Disp: , Rfl:  .  docusate sodium (COLACE) 100 MG capsule, Take 100 mg by mouth 2 (two) times daily. Pt takes every other day, Disp: , Rfl:  .  glipiZIDE (GLUCOTROL) 5 MG tablet, Take 1 tablet (5 mg total) by mouth 2 (two) times daily before a meal. Half to one (Patient taking differently: Take 2.5 mg by mouth 2 (two) times daily before a meal. Half to one), Disp: 180 tablet, Rfl: 1 .  glucose blood test strip, Use as instructed, Disp: 100 each, Rfl: 12 .  magnesium oxide (MAG-OX) 400 (241.3 Mg) MG tablet, Take 1 tablet (400 mg total) by mouth 2 (two) times daily., Disp: 60 tablet, Rfl: 0 .  metFORMIN (GLUCOPHAGE-XR) 750 MG 24 hr  tablet, Take 2 tablets (1,500 mg total) by mouth daily with breakfast., Disp: 180 tablet, Rfl: 1 .  metoprolol succinate (TOPROL-XL) 50 MG 24 hr tablet, Take 1 tablet (50 mg total) by mouth daily. Take with or immediately following a meal., Disp: 90 tablet, Rfl: 1 .  Multiple Vitamins-Minerals (CENTRUM SILVER PO), Take 1 tablet by mouth daily. , Disp: , Rfl:  .  omeprazole (PRILOSEC) 40 MG capsule, Take 1 capsule (40 mg total) by mouth daily., Disp: 90 capsule, Rfl: 1 .  potassium chloride SA (KLOR-CON) 20 MEQ tablet, Take 1 tablet (20 mEq total) by mouth daily., Disp: 90 tablet, Rfl: 1 .  sitaGLIPtin (JANUVIA) 100 MG tablet,  Take 1 tablet (100 mg total) by mouth daily., Disp: 90 tablet, Rfl: 1 .  valsartan-hydrochlorothiazide (DIOVAN HCT) 160-12.5 MG tablet, Take 1 tablet by mouth daily. In place of lisinopril hctz for BP, Disp: 90 tablet, Rfl: 1  No Known Allergies  I personally reviewed active problem list, medication list, surgical history , social history and previous notes  with the patient/caregiver today.   ROS  Constitutional: Negative for fever or weight change.  Respiratory: Negative for cough, positive for mild  shortness of breath with activity    Cardiovascular: Negative for chest pain or palpitations.  Gastrointestinal: Negative for abdominal pain, no bowel changes.  Musculoskeletal: positive for gait problem but no  joint swelling.  Skin: Negative for rash.  Neurological: Negative for dizziness or headache.  No other specific complaints in a complete review of systems (except as listed in HPI above).  Objective  Vitals:   12/02/19 1001  BP: 132/80  Pulse: 62  Resp: 18  Temp: (!) 97.3 F (36.3 C)  TempSrc: Temporal  SpO2: 97%  Weight: 221 lb 11.2 oz (100.6 kg)  Height: 5\' 6"  (1.676 m)    Body mass index is 35.78 kg/m.  Physical Exam  Constitutional: Patient appears well-developed and well-nourished. Obese  No distress.  HEENT: head atraumatic,  normocephalic, pupils equal and reactive to light, neck supple Cardiovascular: Normal rate, regular rhythm and normal heart sounds.  No murmur heard. No BLE edema. Pulmonary/Chest: Effort normal and breath sounds normal. No respiratory distress. Abdominal: Soft.  There is no tenderness. Skin: senile purpura  Psychiatric: Patient has a normal mood and affect. behavior is normal. Judgment and thought content normal.  Recent Results (from the past 2160 hour(s))  POCT HgB A1C     Status: Abnormal   Collection Time: 12/02/19 10:19 AM  Result Value Ref Range   Hemoglobin A1C 6.5 (A) 4.0 - 5.6 %   HbA1c POC (<> result, manual entry)     HbA1c, POC (prediabetic range)     HbA1c, POC (controlled diabetic range)        PHQ2/9: Depression screen [facility] 2/9 12/02/2019 08/21/2019 04/17/2019 02/17/2019 10/17/2018  Decreased Interest 0 0 0 0 0  Down, Depressed, Hopeless 0 0 0 0 0  PHQ - 2 Score 0 0 0 0 0  Altered sleeping 0 0 - 0 0  Tired, decreased energy 0 0 - 2 0  Change in appetite 0 0 - 0 0  Feeling bad or failure about yourself  0 0 - 0 0  Trouble concentrating 0 0 - 0 0  Moving slowly or fidgety/restless 0 0 - 0 0  Suicidal thoughts 0 0 - 0 0  PHQ-9 Score 0 0 - 2 0  Difficult doing work/chores Not difficult at all Not difficult at all - Not difficult at all -  Some recent data might be hidden    phq 9 is negative   Fall Risk: Fall Risk  12/02/2019 08/21/2019 04/17/2019 02/17/2019 10/17/2018  Falls in the past year? 0 0 0 0 0  Number falls in past yr: 0 0 0 0 0  Injury with Fall? 0 0 0 0 0  Risk for fall due to : - - History of fall(s) - -  Risk for fall due to: Comment - - - - -  Follow up Falls evaluation completed - Falls prevention discussed - -     Functional Status Survey: Is the patient deaf or have difficulty hearing?: Yes Does the patient have difficulty  seeing, even when wearing glasses/contacts?: No Does the patient have difficulty concentrating, remembering, or making decisions?:  No Does the patient have difficulty walking or climbing stairs?: No Does the patient have difficulty dressing or bathing?: No Does the patient have difficulty doing errands alone such as visiting a doctor's office or shopping?: No    Assessment & Plan  1. Diabetes mellitus type 2 in obese (HCC)  - POCT HgB A1C - glipiZIDE (GLUCOTROL XL) 2.5 MG 24 hr tablet; Take 1 tablet (2.5 mg total) by mouth daily with breakfast.  Dispense: 90 tablet; Refill: 1 - sitaGLIPtin (JANUVIA) 100 MG tablet; Take 1 tablet (100 mg total) by mouth daily.  Dispense: 90 tablet; Refill: 1 - metFORMIN (GLUCOPHAGE-XR) 750 MG 24 hr tablet; Take 2 tablets (1,500 mg total) by mouth daily with breakfast.  Dispense: 180 tablet; Refill: 1  2. Type 2 diabetes mellitus with microalbuminuria, without long-term current use of insulin (HCC)  - glipiZIDE (GLUCOTROL XL) 2.5 MG 24 hr tablet; Take 1 tablet (2.5 mg total) by mouth daily with breakfast.  Dispense: 90 tablet; Refill: 1 - valsartan-hydrochlorothiazide (DIOVAN HCT) 160-12.5 MG tablet; Take 1 tablet by mouth daily. In place of lisinopril hctz for BP  Dispense: 90 tablet; Refill: 1 - sitaGLIPtin (JANUVIA) 100 MG tablet; Take 1 tablet (100 mg total) by mouth daily.  Dispense: 90 tablet; Refill: 1 - metFORMIN (GLUCOPHAGE-XR) 750 MG 24 hr tablet; Take 2 tablets (1,500 mg total) by mouth daily with breakfast.  Dispense: 180 tablet; Refill: 1  3. Dyslipidemia associated with type 2 diabetes mellitus (HCC)  - sitaGLIPtin (JANUVIA) 100 MG tablet; Take 1 tablet (100 mg total) by mouth daily.  Dispense: 90 tablet; Refill: 1 - metFORMIN (GLUCOPHAGE-XR) 750 MG 24 hr tablet; Take 2 tablets (1,500 mg total) by mouth daily with breakfast.  Dispense: 180 tablet; Refill: 1 - atorvastatin (LIPITOR) 40 MG tablet; Take 1 tablet (40 mg total) by mouth daily.  Dispense: 90 tablet; Refill: 1  4. Essential hypertension  - valsartan-hydrochlorothiazide (DIOVAN HCT) 160-12.5 MG tablet; Take 1  tablet by mouth daily. In place of lisinopril hctz for BP  Dispense: 90 tablet; Refill: 1 - potassium chloride SA (KLOR-CON) 20 MEQ tablet; Take 1 tablet (20 mEq total) by mouth daily.  Dispense: 90 tablet; Refill: 1 - metoprolol succinate (TOPROL-XL) 50 MG 24 hr tablet; Take 1 tablet (50 mg total) by mouth daily. Take with or immediately following a meal.  Dispense: 90 tablet; Refill: 1 - amLODipine (NORVASC) 2.5 MG tablet; Take 1 tablet (2.5 mg total) by mouth daily.  Dispense: 90 tablet; Refill: 1  5. Hypertension associated with diabetes ([place])  - glipiZIDE (GLUCOTROL XL) 2.5 MG 24 hr tablet; Take 1 tablet (2.5 mg total) by mouth daily with breakfast.  Dispense: 90 tablet; Refill: 1 - valsartan-hydrochlorothiazide (DIOVAN HCT) 160-12.5 MG tablet; Take 1 tablet by mouth daily. In place of lisinopril hctz for BP  Dispense: 90 tablet; Refill: 1 - metoprolol succinate (TOPROL-XL) 50 MG 24 hr tablet; Take 1 tablet (50 mg total) by mouth daily. Take with or immediately following a meal.  Dispense: 90 tablet; Refill: 1 - amLODipine (NORVASC) 2.5 MG tablet; Take 1 tablet (2.5 mg total) by mouth daily.  Dispense: 90 tablet; Refill: 1  6. Gastroesophageal reflux disease without esophagitis  - omeprazole (PRILOSEC) 40 MG capsule; Take 1 capsule (40 mg total) by mouth daily.  Dispense: 90 capsule; Refill: 1  7. Senile purpura (HCC)  On both arms  8. Obstructive sleep apnea  on CPAP  Compliant   9. Iron deficiency anemia, unspecified iron deficiency anemia type  He has not started medication, negative hemoccult stools, discussed high iron diet , add supplementation and recheck next visit, if still low refer to hematologist  - ferrous sulfate 325 (65 FE) MG tablet; Take 1 tablet (325 mg total) by mouth 2 (two) times daily with a meal. Add colace 100 mg  Dispense: 180 tablet; Refill: 1

## 2019-12-25 ENCOUNTER — Other Ambulatory Visit: Payer: Self-pay | Admitting: Neurosurgery

## 2019-12-25 DIAGNOSIS — D329 Benign neoplasm of meninges, unspecified: Secondary | ICD-10-CM

## 2020-01-22 ENCOUNTER — Other Ambulatory Visit: Payer: Self-pay

## 2020-01-22 ENCOUNTER — Ambulatory Visit
Admission: RE | Admit: 2020-01-22 | Discharge: 2020-01-22 | Disposition: A | Discharge status: Home / Self Care | Payer: Medicare Other | Source: Ambulatory Visit | Attending: Neurosurgery | Admitting: Neurosurgery

## 2020-01-22 DIAGNOSIS — D329 Benign neoplasm of meninges, unspecified: Secondary | ICD-10-CM | POA: Insufficient documentation

## 2020-01-22 DIAGNOSIS — R590 Localized enlarged lymph nodes: Secondary | ICD-10-CM | POA: Diagnosis not present

## 2020-01-22 DIAGNOSIS — I6389 Other cerebral infarction: Secondary | ICD-10-CM | POA: Diagnosis not present

## 2020-01-22 DIAGNOSIS — G9389 Other specified disorders of brain: Secondary | ICD-10-CM | POA: Diagnosis not present

## 2020-01-22 MED ORDER — GADOBUTROL 1 MMOL/ML IV SOLN
10.0000 mL | Freq: Once | INTRAVENOUS | Status: AC | PRN
  Administered 2020-01-22: 10 mL via INTRAVENOUS

## 2020-02-09 DIAGNOSIS — D329 Benign neoplasm of meninges, unspecified: Secondary | ICD-10-CM | POA: Diagnosis not present

## 2020-03-16 ENCOUNTER — Telehealth: Payer: Self-pay | Admitting: *Deleted

## 2020-03-16 NOTE — Chronic Care Management (AMB) (Signed)
  Chronic Care Management   Outreach Note  03/16/2020 Name: Jason Farrell. MRN: [id number] DOB: 1940/08/31  Jason Farrell. is a 79 y.o. year old male who is a primary care patient of Steele Sizer, MD. I reached out to Jason Farrell. by phone today in response to a referral sent by Mr. Jason Mouton Jr.'s health plan.     An unsuccessful telephone outreach was attempted today. The patient was referred to the case management team for assistance with care management and care coordination.   Follow Up Plan: A HIPAA compliant phone message was left for the patient providing contact information and requesting a return call. The care management team will reach out to the patient again over the next 7 days. If patient returns call to provider office, please advise to call [place] at [phone].  [facility]

## 2020-03-23 NOTE — Chronic Care Management (AMB) (Signed)
  Chronic Care Management   Outreach Note  03/23/2020 Name: Jason Farrell. MRN: [id number] DOB: 1941/06/19  [name]. is a 79 y.o. year old male who is a primary care patient of Steele Sizer, MD. I reached out to [name]. by phone today in response to a referral sent by Mr. Purcell Mouton Jr.'s health plan.     A second unsuccessful telephone outreach was attempted today. The patient was referred to the case management team for assistance with care management and care coordination.   Follow Up Plan: A HIPAA compliant phone message was left for the patient providing contact information and requesting a return call. The care management team will reach out to the patient again over the next 7 days. If patient returns call to provider office, please advise to call [place] at [phone].  [place] [facility]

## 2020-03-29 NOTE — Chronic Care Management (AMB) (Signed)
  Chronic Care Management   Outreach Note  03/29/2020 Name: [name]. MRN: [id number] DOB: 15-Jun-1941  Jason Farrell. is a 79 y.o. year old male who is a primary care patient of Steele Sizer, MD. I reached out to [name]. by phone today in response to a referral sent by Mr. Purcell Mouton Jr[employer]s health plan.     Third unsuccessful telephone outreach was attempted today. The patient was referred to the case management team for assistance with care management and care coordination. The patient's primary care provider has been notified of our unsuccessful attempts to make or maintain contact with the patient. The care management team is pleased to engage with this patient at any time in the future should he/she be interested in assistance from the care management team.   Follow Up Plan: A HIPAA compliant phone message was left for the patient providing contact information and requesting a return call. We have been unable to make contact with the patient. The care management team is available to follow up with the patient after provider conversation with the patient regarding recommendation for care management engagement and subsequent re-referral to the care management team.   [place] [facility]

## 2020-03-30 DIAGNOSIS — Z23 Encounter for immunization: Secondary | ICD-10-CM | POA: Diagnosis not present

## 2020-04-01 NOTE — Progress Notes (Signed)
Name: Jason Farrell.   MRN: [id number]    DOB: 12-16-1940   Date:04/04/2020       Progress Note  Subjective  Chief Complaint  Chief Complaint  Patient presents with  . Follow-up  . Diabetes  . Hypertension  . Hyperlipidemia    HPI  Meningioma: he had a partial resection done at [facility] by Dr. Lacinda Axon in 2020, he states he has been doing well, since last visit with Dr. Lacinda Axon he states his memory problems have resolved, no longer having confusion, he denies headaches, no blurred or double vision. He was seen by Dr. Lacinda Axon July 2021 and states only mild growth  DMII: he was diagnosed many years ago,he is taking Januvia, Metformin and Glipizide, glucose at home has been 92-133 (he brought his log) . He denies hypoglycemic episode. Hedenies polyphagia or polydipsia. He states blurred vision has he states eye exam is due soon.  Today A1C is 6.5 %  last visit,continue current medications. Urine micro today was negative, he has associated dyslipidemia, HTN and obesity   Morbid obesity: based on BMI above 35 with multiple risk factors.weight is stable now, he lost weight when hospitalized in 2020 but gradually gained it back. He has DM, HTN, dyslipidemia, OA.   HTN: He had tachycardia and placed on amiodarone that was recently filled by Dr. Rockey Situ, he is on Toprol XL 50 mg daily  , also on amlodipine since 08/2019 , also on valsartan hctz, bp at home has been high 150's to 160's, but at office visits it has been normal, advised to return with his bp machine to compare the readings, we will change some of the medications for the pm.   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 Rockey Situ , denies chest pain or palpitation last visit 10/2019, recheck lipid level,  continue statin therapy   OSA: wearing CPAP every night, denies snoring when wearing the cpap , he states needs to unplug machine to turn off machine, advised to contact supplier   Senile purpura:stable, reassurance given to patient   SVT: doing well on amiodarone and beta blocker, he sees Dr. Rockey Situ yearly. Unchanged   OA knee: explained importance of increasing walking, he was advised to have a knee replacement a few years ago but he decided to hold off, discussed going back to Ortho   Patient Active Problem List   Diagnosis Date Noted  . Prolonged QT interval   . Hypomagnesemia   . PRES (posterior reversible encephalopathy syndrome) 08/30/2018  . Hypoalbuminemia due to protein-calorie malnutrition ([place])   . Hypokalemia   . Encephalopathy   . SVT (supraventricular tachycardia) ([place])   . Meningioma ([place])   . 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 Status post craniotomy 08/12/2018  . Intracranial mass 08/08/2018  . Senile purpura ([place]) 02/21/2018  . Type 2 diabetes mellitus with microalbuminuria, without long-term current use of insulin ([place]) 02/21/2018  . Type 2 diabetes mellitus without complications ([place]) 20/25/4270  . 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 Obstructive sleep apnea on CPAP 12/09/2015  . Arthritis, degenerative 05/06/2015  . Gastro-esophageal reflux disease without esophagitis 05/06/2015  . CAD in native artery 01/25/2015  . Diabetes mellitus type 2 in obese ([place]) 08/16/2014  . Hyperlipidemia 08/16/2014  .  Essential hypertension 08/16/2014  . Morbid obesity ([place]) 08/16/2014  . Stopped smoking with greater than 30 pack year history 08/16/2014  . COLD (chronic obstructive lung disease) ([place]) 08/16/2014    Past Surgical History:  Procedure Laterality Date  . CARDIAC CATHETERIZATION  2006   stent placement  . COLONOSCOPY    . CORONARY ANGIOPLASTY WITH STENT PLACEMENT  2006   stent x 2   . CORONARY ANGIOPLASTY WITH STENT PLACEMENT   1996   stent x 3   . CRANIOTOMY  08/12/2018  . FOOT SURGERY Right   . HALLUX VALGUS CHEILECTOMY Right 02/25/2015   Procedure: HALLUX VALGUS CHEILECTOMY;  Surgeon: [name], DPM;  Location: [facility] ORS;  Service: Podiatry;  Laterality: Right;    Family History  Problem Relation Age of Onset  . Hyperlipidemia Father   . Hypertension Father   . Alcohol abuse Father   . Cancer Sister        lung  . Hypertension Sister   . Heart disease Sister   . Hyperlipidemia Sister   . COPD Sister     Social History   Tobacco Use  . Smoking status: Former Smoker    Packs/day: 1.50    Years: 35.00    Pack years: 52.50    Types: Cigarettes    Start date: 07/17/1959    Quit date: 08/27/1994    Years since quitting: 25.6  . Smokeless tobacco: Never Used  . Tobacco comment: smoking cessation materials not required  Substance Use Topics  . Alcohol use: Not Currently    Alcohol/week: 0.0 standard drinks     Current Outpatient Medications:  .  acetaminophen (TYLENOL) 325 MG tablet, Take 1-2 tablets (325-650 mg total) by mouth every 4 (four) hours as needed for mild pain., Disp: , Rfl:  .  amiodarone (PACERONE) 200 MG tablet, Take 1 tablet (200 mg total) by mouth daily., Disp: 90 tablet, Rfl: 3 .  amLODipine (NORVASC) 2.5 MG tablet, Take 1 tablet (2.5 mg total) by mouth daily., Disp: 90 tablet, Rfl: 1 .  aspirin 81 MG tablet, Take 81 mg by mouth daily., Disp: , Rfl:  .  atorvastatin (LIPITOR) 40 MG tablet, Take 1 tablet (40 mg total) by mouth daily., Disp: 90 tablet, Rfl: 1 .  Cholecalciferol (VITAMIN D) 50 MCG (2000 UT) tablet, Take 2,000 Units by mouth daily., Disp: , Rfl:  .  docusate sodium (COLACE) 100 MG capsule, Take 100 mg by mouth 2 (two) times daily. Pt takes every other day, Disp: , Rfl:  .  ferrous sulfate 325 (65 FE) MG tablet, Take 1 tablet (325 mg total) by mouth 2 (two) times daily with a meal. Add colace 100 mg, Disp: 180 tablet, Rfl: 1 .  glipiZIDE (GLUCOTROL XL) 2.5 MG 24 hr  tablet, Take 1 tablet (2.5 mg total) by mouth daily with breakfast., Disp: 90 tablet, Rfl: 1 .  glucose blood test strip, Use as instructed, Disp: 100 each, Rfl: 12 .  magnesium oxide (MAG-OX) 400 (241.3 Mg) MG tablet, Take 1 tablet (400 mg total) by mouth 2 (two) times daily., Disp: 60 tablet, Rfl: 0 .  metFORMIN (GLUCOPHAGE-XR) 750 MG 24 hr tablet, Take 2 tablets (1,500 mg total) by mouth daily with breakfast., Disp: 180 tablet, Rfl: 1 .  metoprolol succinate (TOPROL-XL) 50 MG 24 hr tablet, Take 1 tablet (50 mg total) by mouth daily. Take with or immediately following a meal., Disp: 90 tablet, Rfl: 1 .  Multiple Vitamins-Minerals (CENTRUM SILVER PO), Take 1 tablet  by mouth daily. , Disp: , Rfl:  .  omeprazole (PRILOSEC) 40 MG capsule, Take 1 capsule (40 mg total) by mouth daily., Disp: 90 capsule, Rfl: 1 .  potassium chloride SA (KLOR-CON) 20 MEQ tablet, Take 1 tablet (20 mEq total) by mouth daily., Disp: 90 tablet, Rfl: 1 .  sitaGLIPtin (JANUVIA) 100 MG tablet, Take 1 tablet (100 mg total) by mouth daily., Disp: 90 tablet, Rfl: 1 .  valsartan-hydrochlorothiazide (DIOVAN HCT) 160-12.5 MG tablet, Take 1 tablet by mouth daily. In place of lisinopril hctz for BP, Disp: 90 tablet, Rfl: 1  No Known Allergies  I personally reviewed active problem list, medication list, allergies, family history, social history, health maintenance with the patient/caregiver today.   ROS  Constitutional: Negative for fever or weight change.  Respiratory: Negative for cough but has  shortness of breath with activity .   Cardiovascular: Negative for chest pain or palpitations.  Gastrointestinal: Negative for abdominal pain, no bowel changes.  Musculoskeletal: Negative for gait problem or joint swelling.  Skin: Negative for rash.  Neurological: Negative for dizziness or headache.  No other specific complaints in a complete review of systems (except as listed in HPI above).  Objective  Vitals:   04/04/20 1042   BP: (!) 122/58  Pulse: 62  Resp: 16  Temp: 97.6 F (36.4 C)  SpO2: 100%  Weight: 218 lb 12.8 oz (99.2 kg)  Height: 5\' 6"  (1.676 m)    Body mass index is 35.32 kg/m.  Physical Exam  Constitutional: Patient appears well-developed and well-nourished. Obese  No distress.  HEENT: head atraumatic, normocephalic, pupils equal and reactive to light,neck supple Cardiovascular: Normal rate, regular rhythm and normal heart sounds.  No murmur heard. No BLE edema. Pulmonary/Chest: Effort normal and breath sounds normal. No respiratory distress. Abdominal: Soft.  There is no tenderness. Psychiatric: Patient has a normal mood and affect. behavior is normal. Judgment and thought content normal. Muscular skeletal: crepitus with extension of knee    Recent Results (from the past 2160 hour(s))  POCT UA - Microalbumin     Status: Normal   Collection Time: 04/04/20 10:57 AM  Result Value Ref Range   Microalbumin Ur, POC neg mg/L   Creatinine, POC     Albumin/Creatinine Ratio, Urine, POC    POCT HgB A1C     Status: Abnormal   Collection Time: 04/04/20 10:58 AM  Result Value Ref Range   Hemoglobin A1C 6.5 (A) 4.0 - 5.6 %   HbA1c POC (<> result, manual entry)     HbA1c, POC (prediabetic range)     HbA1c, POC (controlled diabetic range)       PHQ2/9: Depression screen [facility] 2/9 04/04/2020 12/02/2019 08/21/2019 04/17/2019 02/17/2019  Decreased Interest 1 0 0 0 0  Down, Depressed, Hopeless 0 0 0 0 0  PHQ - 2 Score 1 0 0 0 0  Altered sleeping 0 0 0 - 0  Tired, decreased energy 0 0 0 - 2  Change in appetite 0 0 0 - 0  Feeling bad or failure about yourself  0 0 0 - 0  Trouble concentrating 0 0 0 - 0  Moving slowly or fidgety/restless 0 0 0 - 0  Suicidal thoughts 0 0 0 - 0  PHQ-9 Score 1 0 0 - 2  Difficult doing work/chores Not difficult at all Not difficult at all Not difficult at all - Not difficult at all  Some recent data might be hidden    phq 9 is negative  Fall Risk: Fall Risk   04/04/2020 12/02/2019 08/21/2019 04/17/2019 02/17/2019  Falls in the past year? 0 0 0 0 0  Number falls in past yr: 0 0 0 0 0  Injury with Fall? 0 0 0 0 0  Risk for fall due to : - - - History of fall(s) -  Risk for fall due to: Comment - - - - -  Follow up - Falls evaluation completed - Falls prevention discussed -     Functional Status Survey: Is the patient deaf or have difficulty hearing?: Yes Does the patient have difficulty seeing, even when wearing glasses/contacts?: No Does the patient have difficulty concentrating, remembering, or making decisions?: No Does the patient have difficulty walking or climbing stairs?: No Does the patient have difficulty dressing or bathing?: No Does the patient have difficulty doing errands alone such as visiting a doctor's office or shopping?: No    Assessment & Plan   1. Diabetes mellitus type 2 in obese (HCC)  - POCT HgB A1C - POCT UA - Microalbumin  2. Essential hypertension  - COMPLETE METABOLIC PANEL WITH GFR  3. Dyslipidemia associated with type 2 diabetes mellitus ([facility]CC) * - Lipid panel  4. Gastroesophageal reflux disease without esophagitis  Under control   5. Obstructive sleep apnea on CPAP   6. Encounter for hepatitis C screening test for low risk patient  - Hepatitis C Antibody  7. Iron deficiency anemia, unspecified iron deficiency anemia type  - CBC with Differential/Platelet - Iron, TIBC and Ferritin Panel  8. Hypertension associated with diabetes ([place])  bp is high at home, but at goal here and during other doctor's visits, advised to return with his bp cuff to recheck bp next week, also change norvasc and metoprolol for night time   9. Senile purpura (HCC)  Stable   10. Morbid obesity ([place])  BMI above 35 with co-morbidities   11. Benign meningioma ([place])  Seeing Dr. Lacinda Axon  12. Chronic bronchitis, unspecified chronic bronchitis type (HCC)  Stable   13. History of myocardial infarct at age less than 72  years   69. Primary osteoarthritis involving multiple joints

## 2020-04-04 ENCOUNTER — Ambulatory Visit (INDEPENDENT_AMBULATORY_CARE_PROVIDER_SITE_OTHER): Payer: Medicare Other | Admitting: Family Medicine

## 2020-04-04 ENCOUNTER — Other Ambulatory Visit: Payer: Self-pay

## 2020-04-04 ENCOUNTER — Encounter: Payer: Self-pay | Admitting: Family Medicine

## 2020-04-04 VITALS — BP 122/58 | HR 62 | Temp 97.6°F | Resp 16 | Ht 66.0 in | Wt 218.8 lb

## 2020-04-04 DIAGNOSIS — E1159 Type 2 diabetes mellitus with other circulatory complications: Secondary | ICD-10-CM | POA: Diagnosis not present

## 2020-04-04 DIAGNOSIS — D509 Iron deficiency anemia, unspecified: Secondary | ICD-10-CM | POA: Diagnosis not present

## 2020-04-04 DIAGNOSIS — E785 Hyperlipidemia, unspecified: Secondary | ICD-10-CM

## 2020-04-04 DIAGNOSIS — R809 Proteinuria, unspecified: Secondary | ICD-10-CM

## 2020-04-04 DIAGNOSIS — Z1159 Encounter for screening for other viral diseases: Secondary | ICD-10-CM

## 2020-04-04 DIAGNOSIS — M8949 Other hypertrophic osteoarthropathy, multiple sites: Secondary | ICD-10-CM

## 2020-04-04 DIAGNOSIS — Z9989 Dependence on other enabling machines and devices: Secondary | ICD-10-CM

## 2020-04-04 DIAGNOSIS — I25118 Atherosclerotic heart disease of native coronary artery with other forms of angina pectoris: Secondary | ICD-10-CM

## 2020-04-04 DIAGNOSIS — I10 Essential (primary) hypertension: Secondary | ICD-10-CM

## 2020-04-04 DIAGNOSIS — J42 Unspecified chronic bronchitis: Secondary | ICD-10-CM

## 2020-04-04 DIAGNOSIS — E669 Obesity, unspecified: Secondary | ICD-10-CM

## 2020-04-04 DIAGNOSIS — K219 Gastro-esophageal reflux disease without esophagitis: Secondary | ICD-10-CM | POA: Diagnosis not present

## 2020-04-04 DIAGNOSIS — G4733 Obstructive sleep apnea (adult) (pediatric): Secondary | ICD-10-CM

## 2020-04-04 DIAGNOSIS — I252 Old myocardial infarction: Secondary | ICD-10-CM

## 2020-04-04 DIAGNOSIS — D329 Benign neoplasm of meninges, unspecified: Secondary | ICD-10-CM | POA: Diagnosis not present

## 2020-04-04 DIAGNOSIS — D692 Other nonthrombocytopenic purpura: Secondary | ICD-10-CM

## 2020-04-04 DIAGNOSIS — E1169 Type 2 diabetes mellitus with other specified complication: Secondary | ICD-10-CM | POA: Diagnosis not present

## 2020-04-04 DIAGNOSIS — E1129 Type 2 diabetes mellitus with other diabetic kidney complication: Secondary | ICD-10-CM

## 2020-04-04 DIAGNOSIS — M159 Polyosteoarthritis, unspecified: Secondary | ICD-10-CM

## 2020-04-04 LAB — POCT GLYCOSYLATED HEMOGLOBIN (HGB A1C): Hemoglobin A1C: 6.5 % — AB (ref 4.0–5.6)

## 2020-04-04 LAB — POCT UA - MICROALBUMIN: Microalbumin Ur, POC: NEGATIVE mg/L

## 2020-04-04 MED ORDER — POTASSIUM CHLORIDE CRYS ER 20 MEQ PO TBCR: 20.0000 meq | EXTENDED_RELEASE_TABLET | Freq: Every day | ORAL | 1 refills | Status: DC

## 2020-04-04 MED ORDER — SITAGLIPTIN PHOSPHATE 100 MG PO TABS: 100.0000 mg | ORAL_TABLET | Freq: Every day | ORAL | 1 refills | Status: DC

## 2020-04-04 MED ORDER — VALSARTAN-HYDROCHLOROTHIAZIDE 160-12.5 MG PO TABS: 1.0000 | ORAL_TABLET | Freq: Every day | ORAL | 1 refills | Status: DC

## 2020-04-04 MED ORDER — ATORVASTATIN CALCIUM 40 MG PO TABS: 40.0000 mg | ORAL_TABLET | Freq: Every day | ORAL | 1 refills | Status: DC

## 2020-04-04 MED ORDER — AMLODIPINE BESYLATE 2.5 MG PO TABS: 2.5000 mg | ORAL_TABLET | Freq: Every evening | ORAL | 1 refills | Status: DC

## 2020-04-04 MED ORDER — OMEPRAZOLE 40 MG PO CPDR: 40.0000 mg | DELAYED_RELEASE_CAPSULE | Freq: Every day | ORAL | 1 refills | Status: DC

## 2020-04-04 MED ORDER — METOPROLOL SUCCINATE ER 50 MG PO TB24: 50.0000 mg | ORAL_TABLET | Freq: Every evening | ORAL | 1 refills | Status: DC

## 2020-04-04 MED ORDER — GLIPIZIDE ER 2.5 MG PO TB24: 2.5000 mg | ORAL_TABLET | Freq: Every day | ORAL | 1 refills | Status: DC

## 2020-04-04 MED ORDER — METFORMIN HCL ER 750 MG PO TB24: 1500.0000 mg | ORAL_TABLET | Freq: Every day | ORAL | 1 refills | Status: DC

## 2020-04-05 LAB — IRON,TIBC AND FERRITIN PANEL
%SAT: 24 % (calc) (ref 20–48)
Ferritin: 18 ng/mL — ABNORMAL LOW (ref 24–380)
Iron: 98 ug/dL (ref 50–180)
TIBC: 407 mcg/dL (calc) (ref 250–425)

## 2020-04-05 LAB — COMPLETE METABOLIC PANEL WITH GFR
AG Ratio: 2 (calc) (ref 1.0–2.5)
ALT: 18 U/L (ref 9–46)
AST: 12 U/L (ref 10–35)
Albumin: 4.3 g/dL (ref 3.6–5.1)
Alkaline phosphatase (APISO): 71 U/L (ref 35–144)
BUN: 14 mg/dL (ref 7–25)
CO2: 29 mmol/L (ref 20–32)
Calcium: 9.4 mg/dL (ref 8.6–10.3)
Chloride: 103 mmol/L (ref 98–110)
Creat: 0.82 mg/dL (ref 0.70–1.18)
GFR, Est African American: 99 mL/min/{1.73_m2} (ref >=60)
GFR, Est Non African American: 85 mL/min/{1.73_m2} (ref >=60)
Globulin: 2.2 g/dL (calc) (ref 1.9–3.7)
Glucose, Bld: 94 mg/dL (ref 65–99)
Potassium: 4.4 mmol/L (ref 3.5–5.3)
Sodium: 141 mmol/L (ref 135–146)
Total Bilirubin: 0.4 mg/dL (ref 0.2–1.2)
Total Protein: 6.5 g/dL (ref 6.1–8.1)

## 2020-04-05 LAB — CBC WITH DIFFERENTIAL/PLATELET
Absolute Monocytes: 639 cells/uL (ref 200–950)
Basophils Absolute: 66 cells/uL (ref 0–200)
Basophils Relative: 0.8 %
Eosinophils Absolute: 125 cells/uL (ref 15–500)
Eosinophils Relative: 1.5 %
HCT: 47.4 % (ref 38.5–50.0)
Hemoglobin: 15.8 g/dL (ref 13.2–17.1)
Lymphs Abs: 1087 cells/uL (ref 850–3900)
MCH: 28.6 pg (ref 27.0–33.0)
MCHC: 33.3 g/dL (ref 32.0–36.0)
MCV: 85.9 fL (ref 80.0–100.0)
MPV: 10.8 fL (ref 7.5–12.5)
Monocytes Relative: 7.7 %
Neutro Abs: 6383 cells/uL (ref 1500–7800)
Neutrophils Relative %: 76.9 %
Platelets: 231 10*3/uL (ref 140–400)
RBC: 5.52 10*6/uL (ref 4.20–5.80)
RDW: 15.6 % — ABNORMAL HIGH (ref 11.0–15.0)
Total Lymphocyte: 13.1 %
WBC: 8.3 10*3/uL (ref 3.8–10.8)

## 2020-04-05 LAB — LIPID PANEL
Cholesterol: 127 mg/dL (ref <200)
HDL: 50 mg/dL (ref >=40)
LDL Cholesterol (Calc): 56 mg/dL (calc)
Non-HDL Cholesterol (Calc): 77 mg/dL (calc) (ref <130)
Total CHOL/HDL Ratio: 2.5 (calc) (ref <5.0)
Triglycerides: 124 mg/dL (ref <150)

## 2020-04-05 LAB — HEPATITIS C ANTIBODY
Hepatitis C Ab: NONREACTIVE
SIGNAL TO CUT-OFF: 0 (ref <1.00)

## 2020-04-21 ENCOUNTER — Other Ambulatory Visit: Payer: Self-pay

## 2020-04-21 ENCOUNTER — Ambulatory Visit (INDEPENDENT_AMBULATORY_CARE_PROVIDER_SITE_OTHER): Payer: Medicare Other

## 2020-04-21 VITALS — BP 160/72 | HR 66 | Temp 98.0°F | Resp 15 | Ht 66.0 in | Wt 213.7 lb

## 2020-04-21 DIAGNOSIS — Z0000 Encounter for general adult medical examination without abnormal findings: Secondary | ICD-10-CM

## 2020-04-21 NOTE — Progress Notes (Signed)
Subjective:   Jason Farrell. is a 79 y.o. male who presents for Medicare Annual/Subsequent preventive examination.  Review of Systems     Cardiac Risk Factors include: advanced age (>71men, >27 women);diabetes mellitus;dyslipidemia;male gender;hypertension;obesity (BMI >30kg/m2)     Objective:    Today's Vitals   04/21/20 1022 04/21/20 1025  BP: (!) 160/72   Pulse: 66   Resp: 15   Temp: 98 F (36.7 C)   TempSrc: Oral   SpO2: 99%   Weight: 213 lb 11.2 oz (96.9 kg)   Height: 5\' 6"  (1.676 m)   PainSc:  3    Body mass index is 34.49 kg/m.  Advanced Directives 04/21/2020 04/17/2019 09/26/2018 09/23/2018 09/04/2018 08/30/2018 08/28/2018  Does Patient Have a Medical Advance Directive? No No No No No No No  Would patient like information on creating a medical advance directive? No - Patient declined No - Patient declined - No - Patient declined No - Patient declined No - Patient declined No - Patient declined    Current Medications (verified) Outpatient Encounter Medications as of 04/21/2020  Medication Sig  . acetaminophen (TYLENOL) 325 MG tablet Take 1-2 tablets (325-650 mg total) by mouth every 4 (four) hours as needed for mild pain.  Marland Kitchen amiodarone (PACERONE) 200 MG tablet Take 1 tablet (200 mg total) by mouth daily.  Marland Kitchen amLODipine (NORVASC) 2.5 MG tablet Take 1 tablet (2.5 mg total) by mouth every evening.  Marland Kitchen aspirin 81 MG tablet Take 81 mg by mouth daily.  Marland Kitchen atorvastatin (LIPITOR) 40 MG tablet Take 1 tablet (40 mg total) by mouth daily.  . Cholecalciferol (VITAMIN D) 50 MCG (2000 UT) tablet Take 2,000 Units by mouth daily.  Marland Kitchen docusate sodium (COLACE) 100 MG capsule Take 100 mg by mouth 2 (two) times daily. Pt takes every other day  . ferrous sulfate 325 (65 FE) MG tablet Take 1 tablet (325 mg total) by mouth 2 (two) times daily with a meal. Add colace 100 mg  . glipiZIDE (GLUCOTROL XL) 2.5 MG 24 hr tablet Take 1 tablet (2.5 mg total) by mouth daily with breakfast.  . glucose  blood test strip Use as instructed  . magnesium oxide (MAG-OX) 400 (241.3 Mg) MG tablet Take 1 tablet (400 mg total) by mouth 2 (two) times daily.  . metFORMIN (GLUCOPHAGE-XR) 750 MG 24 hr tablet Take 2 tablets (1,500 mg total) by mouth daily with breakfast.  . metoprolol succinate (TOPROL-XL) 50 MG 24 hr tablet Take 1 tablet (50 mg total) by mouth every evening.  . Multiple Vitamins-Minerals (CENTRUM SILVER PO) Take 1 tablet by mouth daily.   Marland Kitchen omeprazole (PRILOSEC) 40 MG capsule Take 1 capsule (40 mg total) by mouth daily.  . potassium chloride SA (KLOR-CON) 20 MEQ tablet Take 1 tablet (20 mEq total) by mouth daily.  . sitaGLIPtin (JANUVIA) 100 MG tablet Take 1 tablet (100 mg total) by mouth daily.  . valsartan-hydrochlorothiazide (DIOVAN HCT) 160-12.5 MG tablet Take 1 tablet by mouth daily. In place of lisinopril hctz for BP   No facility-administered encounter medications on file as of 04/21/2020.    Allergies (verified) Patient has no known allergies.   History: Past Medical History:  Diagnosis Date  . Coronary artery disease   . Diabetes mellitus without complication ([place])   . Dyslipidemia   . GERD (gastroesophageal reflux disease)   . Hyperlipidemia   . Hypertension   . MI (myocardial infarction) ([place])   . Osteoarthritis    Past Surgical History:  Procedure Laterality  Date  . CARDIAC CATHETERIZATION  2006   stent placement  . COLONOSCOPY    . CORONARY ANGIOPLASTY WITH STENT PLACEMENT  2006   stent x 2   . CORONARY ANGIOPLASTY WITH STENT PLACEMENT  1996   stent x 3   . CRANIOTOMY  08/12/2018  . FOOT SURGERY Right   . 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 Hyperlipidemia Father   . Hypertension Father   . Alcohol abuse Father   . Cancer Sister        lung  . Hypertension Sister     . Heart disease Sister   . Hyperlipidemia Sister   . COPD Sister   . Cancer Sister        unknown   Social History   Socioeconomic History  . Marital status: Married    Spouse name: Manuela Schwartz  . Number of children: 3  . Years of education: some college  . Highest education level: 12th grade  Occupational History  . Occupation: Retired  Tobacco Use  . Smoking status: Former Smoker    Packs/day: 1.50    Years: 35.00    Pack years: 52.50    Types: Cigarettes    Start date: 07/17/1959    Quit date: 08/27/1994    Years since quitting: 25.6  . Smokeless tobacco: Never Used  . Tobacco comment: smoking cessation materials not required  Vaping Use  . Vaping Use: Never used  Substance and Sexual Activity  . Alcohol use: Not Currently    Alcohol/week: 0.0 standard drinks  . Drug use: No  . Sexual activity: Not Currently    Partners: Female  Other Topics Concern  . Not on file  Social History Narrative  . Not on file   Social Determinants of Health   Financial Resource Strain: Low Risk   . Difficulty of Paying Living Expenses: Not hard at all  Food Insecurity: No Food Insecurity  . Worried About Charity fundraiser in the Last Year: Never true  . Ran Out of Food in the Last Year: Never true  Transportation Needs: No Transportation Needs  . Lack of Transportation (Medical): No  . Lack of Transportation (Non-Medical): No  Physical Activity:   . Days of Exercise per Week: Not on file  . Minutes of Exercise per Session: Not on file  Stress: No Stress Concern Present  . Feeling of Stress : Only a little  Social Connections: Moderately Isolated  . Frequency of Communication with Friends and Family: More than three times a week  . Frequency of Social Gatherings with Friends and Family: More than three times a week  . Attends Religious Services: Never  . Active Member of Clubs or Organizations: No  . Attends Archivist Meetings: Never  . Marital Status: Married     Tobacco Counseling Counseling given: Not Answered Comment: smoking cessation materials not required   Clinical Intake:  Pre-visit preparation completed: Yes  Pain : 0-10 Pain Score: 3  Pain Type: Chronic pain Pain Location: Back Pain Orientation: Lower Pain Descriptors / Indicators: Aching, Discomfort, Sore Pain Onset: More than a month ago Pain Frequency: Constant     BMI - recorded: 34.49 Nutritional Status: BMI > 30  Obese Nutritional Risks: None Diabetes: Yes CBG done?: No Did pt. bring in CBG monitor from home?:  No  How often do you need to have someone help you when you read instructions, pamphlets, or other written materials from your doctor or pharmacy?: 1 - Never  Nutrition Risk Assessment:  Has the patient had any N/V/D within the last 2 months?  No  Does the patient have any non-healing wounds?  No  Has the patient had any unintentional weight loss or weight gain?  No   Diabetes:  Is the patient diabetic?  Yes  If diabetic, was a CBG obtained today?  No  Did the patient bring in their glucometer from home?  No  How often do you monitor your CBG's? daily.   Financial Strains and Diabetes Management:  Are you having any financial strains with the device, your supplies or your medication? No .  Does the patient want to be seen by Chronic Care Management for management of their diabetes?  No  Would the patient like to be referred to a Nutritionist or for Diabetic Management?  No   Diabetic Exams:  Diabetic Eye Exam: Completed per patient and he has signed a release for records sent to [facility] but we have not received report.   Diabetic Foot Exam: Completed 08/21/19.   Interpreter Needed?: No  Information entered by :: Clemetine Marker LPN   Activities of Daily Living In your present state of health, do you have any difficulty performing the following activities: 04/21/2020 04/04/2020  Hearing? Y Y  Comment declines hearing aids -  Vision? N N   Difficulty concentrating or making decisions? N N  Walking or climbing stairs? N N  Dressing or bathing? N N  Doing errands, shopping? N N  Preparing Food and eating ? N -  Using the Toilet? N -  In the past six months, have you accidently leaked urine? N -  Do you have problems with loss of bowel control? N -  Managing your Medications? N -  Managing your Finances? N -  Housekeeping or managing your Housekeeping? N -  Some recent data might be hidden    Patient Care Team: Steele Sizer, MD as PCP - General (Family Medicine) Rockey Situ Kathlene November, MD as PCP - Cardiology (Cardiology) Minna Merritts, MD as Consulting Physician (Cardiology)  Indicate any recent Medical Services you may have received from other than [facility] providers in the past year (date may be approximate).     Assessment:   This is a routine wellness examination for Daiel.  Hearing/Vision screen  Hearing Screening   125Hz  250Hz  500Hz  1000Hz  2000Hz  3000Hz  4000Hz  6000Hz  8000Hz   Right ear:           Left ear:           Comments: Pt c/o mild hearing difficulty; declines hearing aids   Vision Screening Comments: Annual vision screenings at [place] issues and exercise activities discussed: Current Exercise Habits: The patient does not participate in regular exercise at present, Exercise limited by: orthopedic condition(s)  Goals    . DIET - INCREASE WATER INTAKE     Recommend to drink at least 6-8 8oz glasses of water per day.      Depression Screen PHQ 2/9 Scores 04/21/2020 04/04/2020 12/02/2019 08/21/2019 04/17/2019 02/17/2019 10/17/2018  PHQ - 2 Score 0 1 0 0 0 0 0  PHQ- 9 Score - 1 0 0 - 2 0    Fall Risk Fall Risk  04/21/2020 04/04/2020 12/02/2019 08/21/2019 04/17/2019  Falls in the past year? 0 0 0 0 0  Number  falls in past yr: 0 0 0 0 0  Injury with Fall? 0 0 0 0 0  Risk for fall due to : No Fall Risks - - - History of fall(s)  Risk for fall due to: Comment - - - - -  Follow up Falls prevention  discussed - Falls evaluation completed - Falls prevention discussed    Any stairs in or around the home? Yes  If so, are there any without handrails? No  Home free of loose throw rugs in walkways, pet beds, electrical cords, etc? Yes  Adequate lighting in your home to reduce risk of falls? Yes   ASSISTIVE DEVICES UTILIZED TO PREVENT FALLS:  Life alert? No  Use of a cane, walker or w/c? No  Grab bars in the bathroom? Yes  Shower chair or bench in shower? No  Elevated toilet seat or a handicapped toilet? No   TIMED UP AND GO:  Was the test performed? Yes .  Length of time to ambulate 10 feet: 5 sec.   Gait steady and fast without use of assistive device  Cognitive Function:     6CIT Screen 04/21/2020 04/17/2019 01/23/2018 12/03/2016  What Year? 0 points 0 points 0 points 0 points  What month? 0 points 0 points 0 points 0 points  What time? 0 points 0 points 0 points 0 points  Count back from 20 0 points 0 points 0 points 0 points  Months in reverse 0 points 0 points 2 points 2 points  Repeat phrase 0 points 0 points 2 points 4 points  Total Score 0 0 4 6    Immunizations Immunization History  Administered Date(s) Administered  . Fluad Quad(high Dose 65+) 04/02/2019  . Influenza, High Dose Seasonal PF 03/19/2018  . Influenza, Seasonal, Injecte, Preservative Fre 05/10/2014  . Influenza-Unspecified 04/15/2015, 03/21/2016, 04/02/2020  . PFIZER SARS-COV-2 Vaccination 09/05/2019, 09/29/2019  . Pneumococcal Conjugate-13 07/10/2016  . Pneumococcal Polysaccharide-23 07/22/2009    TDAP status: Due, Education has been provided regarding the importance of this vaccine. Advised may receive this vaccine at local pharmacy or Health Dept. Aware to provide a copy of the vaccination record if obtained from local pharmacy or Health Dept. Verbalized acceptance and understanding.   Flu Vaccine status: Up to date   Pneumococcal vaccine status: Up to date   Covid-19 vaccine status: Completed  vaccines  Qualifies for Shingles Vaccine? Yes   Zostavax completed No   Shingrix Completed?: No.    Education has been provided regarding the importance of this vaccine. Patient has been advised to call insurance company to determine out of pocket expense if they have not yet received this vaccine. Advised may also receive vaccine at local pharmacy or Health Dept. Verbalized acceptance and understanding.  Screening Tests Health Maintenance  Topic Date Due  . OPHTHALMOLOGY EXAM  03/20/2018  . FOOT EXAM  08/20/2020  . HEMOGLOBIN A1C  10/02/2020  . TETANUS/TDAP  07/16/2021  . INFLUENZA VACCINE  Completed  . COVID-19 Vaccine  Completed  . Hepatitis C Screening  Completed  . PNA vac Low Risk Adult  Completed    Health Maintenance  Health Maintenance Due  Topic Date Due  . OPHTHALMOLOGY EXAM  03/20/2018    Colorectal cancer screening: No longer required.   Lung Cancer Screening: (Low Dose CT Chest recommended if Age 6-80 years, 30 pack-year currently smoking OR have quit w/in 15years.) does not qualify.   Additional Screening:  Hepatitis C Screening: does qualify; Completed 04/04/20  Vision Screening: Recommended  annual ophthalmology exams for early detection of glaucoma and other disorders of the eye. Is the patient up to date with their annual eye exam?  Yes  Who is the provider or what is the name of the office in which the patient attends annual eye exams? [facility]  Dental Screening: Recommended annual dental exams for proper oral hygiene  Community Resource Referral / Chronic Care Management: CRR required this visit?  No   CCM required this visit?  No      Plan:     I have personally reviewed and noted the following in the patient's chart:   . Medical and social history . Use of alcohol, tobacco or illicit drugs  . Current medications and supplements . Functional ability and status . Nutritional status . Physical activity . Advanced directives . List of other  physicians . Hospitalizations, surgeries, and ER visits in previous 12 months . Vitals . Screenings to include cognitive, depression, and falls . Referrals and appointments  In addition, I have reviewed and discussed with patient certain preventive protocols, quality metrics, and best practice recommendations. A written personalized care plan for preventive services as well as general preventive health recommendations were provided to patient.     Clemetine Marker, LPN   12/16/6946   Nurse Notes: pt's BP at start of visit 160/72; recheck at end of visit 162/76. Pt denies blurred vision or chest pain but did have a headache yesterday that resolved. Pt states he takes medication regularly and home BP monitor has been running 140s-150s/80s. Scheduled patient for 2 week BP check per Miel and Dr. Ancil Boozer notified via staff msg. Pt advised to go to ER if any above sxs occur.

## 2020-04-21 NOTE — Patient Instructions (Signed)
[name] , Thank you for taking time to come for your Medicare Wellness Visit. I appreciate your ongoing commitment to your health goals. Please review the following plan we discussed and let me know if I can assist you in the future.   Screening recommendations/referrals: Colonoscopy: no longer required Recommended yearly ophthalmology/optometry visit for glaucoma screening and checkup Recommended yearly dental visit for hygiene and checkup  Vaccinations: Influenza vaccine: done 04/02/20 Pneumococcal vaccine: done 07/10/16 Tdap vaccine: due Shingles vaccine: Shingrix discussed. Please contact your pharmacy for coverage information.  Covid-19: done 09/05/19 & 09/29/19  Advanced directives: Advance directive discussed with you today. Even though you declined this today please call our office should you change your mind and we can give you the proper paperwork for you to fill out.  Conditions/risks identified: Recommend drinking 6-8 glasses of water per day   Next appointment: Follow up in one year for your annual wellness visit.   Preventive Care 77 Years and Older, Male Preventive care refers to lifestyle choices and visits with your health care provider that can promote health and wellness. What does preventive care include?  A yearly physical exam. This is also called an annual well check.  Dental exams once or twice a year.  Routine eye exams. Ask your health care provider how often you should have your eyes checked.  Personal lifestyle choices, including:  Daily care of your teeth and gums.  Regular physical activity.  Eating a healthy diet.  Avoiding tobacco and drug use.  Limiting alcohol use.  Practicing safe sex.  Taking low doses of aspirin every day.  Taking vitamin and mineral supplements as recommended by your health care provider. What happens during an annual well check? The services and screenings done by your health care provider during your annual  well check will depend on your age, overall health, lifestyle risk factors, and family history of disease. Counseling  Your health care provider may ask you questions about your:  Alcohol use.  Tobacco use.  Drug use.  Emotional well-being.  Home and relationship well-being.  Sexual activity.  Eating habits.  History of falls.  Memory and ability to understand (cognition).  Work and work Statistician. Screening  You may have the following tests or measurements:  Height, weight, and BMI.  Blood pressure.  Lipid and cholesterol levels. These may be checked every 5 years, or more frequently if you are over 77 years old.  Skin check.  Lung cancer screening. You may have this screening every year starting at age 77 if you have a 30-pack-year history of smoking and currently smoke or have quit within the past 15 years.  Fecal occult blood test (FOBT) of the stool. You may have this test every year starting at age 77.  Flexible sigmoidoscopy or colonoscopy. You may have a sigmoidoscopy every 5 years or a colonoscopy every 10 years starting at age 77.  Prostate cancer screening. Recommendations will vary depending on your family history and other risks.  Hepatitis C blood test.  Hepatitis B blood test.  Sexually transmitted disease (STD) testing.  Diabetes screening. This is done by checking your blood sugar (glucose) after you have not eaten for a while (fasting). You may have this done every 1-3 years.  Abdominal aortic aneurysm (AAA) screening. You may need this if you are a current or former smoker.  Osteoporosis. You may be screened starting at age 77 if you are at high risk. Talk with your health care provider about your test  results, treatment options, and if necessary, the need for more tests. Vaccines  Your health care provider may recommend certain vaccines, such as:  Influenza vaccine. This is recommended every year.  Tetanus, diphtheria, and acellular  pertussis (Tdap, Td) vaccine. You may need a Td booster every 10 years.  Zoster vaccine. You may need this after age 77.  Pneumococcal 13-valent conjugate (PCV13) vaccine. One dose is recommended after age 77.  Pneumococcal polysaccharide (PPSV23) vaccine. One dose is recommended after age 77. Talk to your health care provider about which screenings and vaccines you need and how often you need them. This information is not intended to replace advice given to you by your health care provider. Make sure you discuss any questions you have with your health care provider. Document Released: 07/29/2015 Document Revised: 03/21/2016 Document Reviewed: 05/03/2015 Elsevier Interactive Patient Education  2017 [employer] Prevention in the Home Falls can cause injuries. They can happen to people of all ages. There are many things you can do to make your home safe and to help prevent falls. What can I do on the outside of my home?  Regularly fix the edges of walkways and driveways and fix any cracks.  Remove anything that might make you trip as you walk through a door, such as a raised step or threshold.  Trim any bushes or trees on the path to your home.  Use bright outdoor lighting.  Clear any walking paths of anything that might make someone trip, such as rocks or tools.  Regularly check to see if handrails are loose or broken. Make sure that both sides of any steps have handrails.  Any raised decks and porches should have guardrails on the edges.  Have any leaves, snow, or ice cleared regularly.  Use sand or salt on walking paths during winter.  Clean up any spills in your garage right away. This includes oil or grease spills. What can I do in the bathroom?  Use night lights.  Install grab bars by the toilet and in the tub and shower. Do not use towel bars as grab bars.  Use non-skid mats or decals in the tub or shower.  If you need to sit down in the shower, use a plastic,  non-slip stool.  Keep the floor dry. Clean up any water that spills on the floor as soon as it happens.  Remove soap buildup in the tub or shower regularly.  Attach bath mats securely with double-sided non-slip rug tape.  Do not have throw rugs and other things on the floor that can make you trip. What can I do in the bedroom?  Use night lights.  Make sure that you have a light by your bed that is easy to reach.  Do not use any sheets or blankets that are too big for your bed. They should not hang down onto the floor.  Have a firm chair that has side arms. You can use this for support while you get dressed.  Do not have throw rugs and other things on the floor that can make you trip. What can I do in the kitchen?  Clean up any spills right away.  Avoid walking on wet floors.  Keep items that you use a lot in easy-to-reach places.  If you need to reach something above you, use a strong step stool that has a grab bar.  Keep electrical cords out of the way.  Do not use floor polish or wax that makes  floors slippery. If you must use wax, use non-skid floor wax.  Do not have throw rugs and other things on the floor that can make you trip. What can I do with my stairs?  Do not leave any items on the stairs.  Make sure that there are handrails on both sides of the stairs and use them. Fix handrails that are broken or loose. Make sure that handrails are as long as the stairways.  Check any carpeting to make sure that it is firmly attached to the stairs. Fix any carpet that is loose or worn.  Avoid having throw rugs at the top or bottom of the stairs. If you do have throw rugs, attach them to the floor with carpet tape.  Make sure that you have a light switch at the top of the stairs and the bottom of the stairs. If you do not have them, ask someone to add them for you. What else can I do to help prevent falls?  Wear shoes that:  Do not have high heels.  Have rubber  bottoms.  Are comfortable and fit you well.  Are closed at the toe. Do not wear sandals.  If you use a stepladder:  Make sure that it is fully opened. Do not climb a closed stepladder.  Make sure that both sides of the stepladder are locked into place.  Ask someone to hold it for you, if possible.  Clearly mark and make sure that you can see:  Any grab bars or handrails.  First and last steps.  Where the edge of each step is.  Use tools that help you move around (mobility aids) if they are needed. These include:  Canes.  Walkers.  Scooters.  Crutches.  Turn on the lights when you go into a dark area. Replace any light bulbs as soon as they burn out.  Set up your furniture so you have a clear path. Avoid moving your furniture around.  If any of your floors are uneven, fix them.  If there are any pets around you, be aware of where they are.  Review your medicines with your doctor. Some medicines can make you feel dizzy. This can increase your chance of falling. Ask your doctor what other things that you can do to help prevent falls. This information is not intended to replace advice given to you by your health care provider. Make sure you discuss any questions you have with your health care provider. Document Released: 04/28/2009 Document Revised: 12/08/2015 Document Reviewed: 08/06/2014 Elsevier Interactive Patient Education  2017 [employer].

## 2020-05-05 ENCOUNTER — Ambulatory Visit (INDEPENDENT_AMBULATORY_CARE_PROVIDER_SITE_OTHER): Payer: Medicare Other

## 2020-05-05 ENCOUNTER — Other Ambulatory Visit: Payer: Self-pay

## 2020-05-05 VITALS — BP 146/70 | HR 64

## 2020-05-05 DIAGNOSIS — Z0130 Encounter for examination of blood pressure without abnormal findings: Secondary | ICD-10-CM

## 2020-05-05 NOTE — Progress Notes (Addendum)
Patient is here for a blood pressure check. Patient denies chest pain, palpitations, shortness of breath or visual disturbances. At previous visit blood pressure was 160/72 with a heart rate of 66. Today during nurse visit first check blood pressure was 170/70 with a heart rate of 64. After resting for 10 minutes his blood pressure was 146/70.  He does take blood pressure medications as prescribed with no missed doses. I informed patient we would call if any changes were necessary.   We will continue current dose of amlodipine at 2.5 mg toprol XL 50 mg Increase valsartan hctz to 160/12.5 one and a half daily   Return for bp check in 1 week

## 2020-05-10 DIAGNOSIS — Z23 Encounter for immunization: Secondary | ICD-10-CM | POA: Diagnosis not present

## 2020-07-05 ENCOUNTER — Other Ambulatory Visit: Payer: Self-pay | Admitting: Family Medicine

## 2020-07-05 ENCOUNTER — Telehealth: Payer: Self-pay | Admitting: Family Medicine

## 2020-07-05 MED ORDER — VALSARTAN-HYDROCHLOROTHIAZIDE 320-12.5 MG PO TABS
1.0000 | ORAL_TABLET | Freq: Every day | ORAL | 0 refills | Status: DC
Start: 2020-07-05 — End: 2020-08-04

## 2020-07-05 MED ORDER — VALSARTAN-HYDROCHLOROTHIAZIDE 320-12.5 MG PO TABS: 1.0000 | ORAL_TABLET | Freq: Every day | ORAL | 0 refills | Status: DC

## 2020-07-05 NOTE — Telephone Encounter (Signed)
Per visit 05/05/20- patient was instructed to increase dosing to 1 1/2 tablets daily. Patient needs changes to Rx so he is not running out due to increase

## 2020-07-05 NOTE — Telephone Encounter (Signed)
valsartan-hydrochlorothiazide (DIOVAN HCT) 160-12.5 MG tablet 90 tablet 1 04/04/2020    Sig - Route: Take 1 tablet by mouth daily. In place of lisinopril hctz for BP - Oral   Sent to pharmacy as: valsartan-hydrochlorothiazide (DIOVAN HCT) 160-12.5 MG tablet   E-Prescribing Status: Receipt confirmed by pharmacy (04/04/2020 11:10 AM EDT)    Pt states the last time he was with Dr Ancil Boozer he was told to increase to 1 1/2 tablets, he states he did this but caused him to run out of med and needs new script to be send to "[facility] stating the new dosage. States out of med [place], [place] Phone:  [phone]  Fax:  [phone]

## 2020-07-07 ENCOUNTER — Telehealth: Payer: Self-pay | Admitting: Family Medicine

## 2020-07-07 NOTE — Telephone Encounter (Signed)
Pt calling stating that he is not able to receive his [facility] because they state its too soon. Pt states that the prescription was changed by PCP to have pt take one and a half tablets instead of one. Please advise.      [facility] [place], [address]  [place] [address]  Phone: [phone] Fax: [phone]  Hours: Not open 24 hours

## 2020-08-03 NOTE — Progress Notes (Signed)
Name: Jason Farrell.   MRN: [id number]    DOB: 1940-11-09   Date:08/04/2020       Progress Note  Subjective  Chief Complaint  Follow up   HPI   Meningioma: he had a partial resection done at [facility] by Dr. Lacinda Axon in 2020, he states he has been doing well, since last visit with Dr. Lacinda Axon he states his memory problems have resolved, no longer having confusion, he denies headaches, no blurred or double vision. He was seen by Dr. Lacinda Axon July 2021 and states only mild growth  DMII: he was diagnosed many years ago,he is taking Januvia, Metformin and Glipizide, glucose at home has been in the 120's range  (he brought his log) . He denies hypoglycemic episode. Hedenies polyphagia or polydipsia. Today A1C is 6.5 % and also last visit, Januvia is expensive and his A1C goal is close to 7 %, we will try stopping medication and monitoring glucose at home  Urine micro was negative , he has associated dyslipidemia, HTN and obesity   Claudication: weak distal pulses, he states he has aching on both legs when he walks, thigh and lower leg, he needs to take breaks, can walk at most one quarter a mild without stopping, discussed importance of seeing vascular surgeon but he would like to hold off for now. Continues statin therapy , and physical activity He also takes aspirin   Morbid obesity: based on BMI above 35 with multiple risk factors.weight is stable now, discussed importance of weight loss again  He has DM, HTN, dyslipidemia, OA.   HTN: He had tachycardia and placed on amiodarone that was recently filled by Dr. Rockey Situ, he is on Toprol XL 50 mg daily  , also on amlodipine since 08/2019 , also on valsartan hctz, just went from 160/12.5 to 320/12.5 a couple of weeks ago, bp still spiking to 161, usually high 140's, we will adjust dose of hctz   COPD with history of tobacco use: he denies daily cough,mild SOB with moderate activity, no wheezing.Quit smoking in 1996, after MI.He never had to use  inhalers, no flares in a while He takes breaks when doing yard work of shoveling snow.   S/p MI and and history of stent placement: sees Dr. Rockey Situ , he states he has noticed some chest pain in the morning that lasts a couple of minutes, started a few months ago, not associated sob, diaphoresis or palpitation , he has discussed it with Dr. Rockey Situ and given reassurance. Advised to call 911 if persists over 10 minutes, higher intensity or associated with other symptoms   OSA: wearing CPAP every night, denies snoring when wearing the cpap , he states needs to unplug machine to turn off machine, he has not contacted supplier yet   Senile purpura:stable, reassurance given to patient . Taking aspirin   SVT: doing well on amiodarone and beta blocker, he sees Dr. Rockey Situ yearly. 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 Prolonged QT interval   . Hypomagnesemia   . PRES (posterior reversible encephalopathy syndrome) 08/30/2018  . Hypoalbuminemia due to protein-calorie malnutrition ([place])   . Hypokalemia   .  Encephalopathy   . SVT (supraventricular tachycardia) ([place])   . Meningioma ([place])   . 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 Status post craniotomy 08/12/2018  . Intracranial mass 08/08/2018  . Senile purpura ([place]) 02/21/2018  . Type 2 diabetes mellitus with microalbuminuria, without long-term current use of insulin ([place]) 02/21/2018  . Type 2 diabetes mellitus without complications ([place]) [id number]  . 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 Obstructive sleep apnea on CPAP 12/09/2015  . Arthritis, degenerative 05/06/2015  . Gastro-esophageal reflux  disease without esophagitis 05/06/2015  . CAD in native artery 01/25/2015  . Diabetes mellitus type 2 in obese ([place]) 08/16/2014  . Hyperlipidemia 08/16/2014  . Essential hypertension 08/16/2014  . Morbid obesity ([place]) 08/16/2014  . Stopped smoking with greater than 30 pack year history 08/16/2014  . COLD (chronic obstructive lung disease) ([place]) 08/16/2014    Past Surgical History:  Procedure Laterality Date  . CARDIAC CATHETERIZATION  2006   stent placement  . COLONOSCOPY    . CORONARY ANGIOPLASTY WITH STENT PLACEMENT  2006   stent x 2   . CORONARY ANGIOPLASTY WITH STENT PLACEMENT  1996   stent x 3   . CRANIOTOMY  08/12/2018  . FOOT SURGERY Right   . HALLUX VALGUS CHEILECTOMY Right 02/25/2015   Procedure: HALLUX VALGUS CHEILECTOMY;  Surgeon: [name], DPM;  Location: [facility] ORS;  Service: Podiatry;  Laterality: Right;    Family History  Problem Relation Age of Onset  . Hyperlipidemia Father   . Hypertension Father   . Alcohol abuse Father   . Cancer Sister        lung  . Hypertension Sister   . Heart disease Sister   . Hyperlipidemia Sister   . COPD Sister   . Cancer Sister        unknown    Social History   Tobacco Use  . Smoking status: Former Smoker    Packs/day: 1.50    Years: 35.00    Pack years: 52.50    Types: Cigarettes    Start date: 07/17/1959    Quit date: 08/27/1994    Years since quitting: 25.9  . Smokeless tobacco: Never Used  . Tobacco comment: smoking cessation materials not required  Substance Use Topics  . Alcohol use: Not Currently    Alcohol/week: 0.0 standard drinks     Current Outpatient Medications:  .  acetaminophen (TYLENOL) 325 MG tablet, Take 1-2 tablets (325-650 mg total) by mouth every 4 (four) hours as needed for mild pain., Disp: , Rfl:  .  amiodarone (PACERONE) 200 MG tablet, Take 1 tablet (200 mg total) by mouth daily., Disp: 90 tablet, Rfl: 3 .  aspirin 81 MG tablet, Take 81 mg by mouth daily., Disp: , Rfl:  .   Cholecalciferol (VITAMIN D) 50 MCG (2000 UT) tablet, Take 2,000 Units by mouth daily., Disp: , Rfl:  .  docusate sodium (COLACE) 100 MG capsule, Take 100 mg by mouth 2 (two) times daily. Pt takes every other day, Disp: , Rfl:  .  ferrous sulfate 325 (65 FE) MG tablet, Take 1 tablet (325 mg total) by mouth 2 (two) times daily with a meal. Add colace 100 mg, Disp: 180 tablet, Rfl: 1 .  glucose blood test strip, Use as instructed, Disp: 100 each, Rfl: 12 .  magnesium oxide (MAG-OX) 400 (241.3 Mg) MG tablet, Take 1 tablet (400 mg total) by mouth 2 (two) times daily., Disp: 60 tablet, Rfl: 0 .  Multiple Vitamins-Minerals (CENTRUM SILVER PO),  Take 1 tablet by mouth daily. , Disp: , Rfl:  .  omeprazole (PRILOSEC) 40 MG capsule, Take 1 capsule (40 mg total) by mouth daily., Disp: 90 capsule, Rfl: 1 .  valsartan-hydrochlorothiazide (DIOVAN-HCT) 320-25 MG tablet, Take 1 tablet by mouth daily., Disp: 90 tablet, Rfl: 0 .  amLODipine (NORVASC) 2.5 MG tablet, Take 1 tablet (2.5 mg total) by mouth every evening., Disp: 90 tablet, Rfl: 1 .  atorvastatin (LIPITOR) 40 MG tablet, Take 1 tablet (40 mg total) by mouth daily., Disp: 90 tablet, Rfl: 1 .  glipiZIDE (GLUCOTROL XL) 2.5 MG 24 hr tablet, Take 1 tablet (2.5 mg total) by mouth daily with breakfast., Disp: 90 tablet, Rfl: 1 .  metFORMIN (GLUCOPHAGE-XR) 750 MG 24 hr tablet, Take 2 tablets (1,500 mg total) by mouth daily with breakfast., Disp: 180 tablet, Rfl: 1 .  metoprolol succinate (TOPROL-XL) 50 MG 24 hr tablet, Take 1 tablet (50 mg total) by mouth every evening., Disp: 90 tablet, Rfl: 1 .  potassium chloride SA (KLOR-CON) 20 MEQ tablet, Take 1 tablet (20 mEq total) by mouth daily., Disp: 90 tablet, Rfl: 1  No Known Allergies  I personally reviewed active problem list, medication list, allergies, family history, social history, health maintenance with the patient/caregiver today.   ROS  Constitutional: Negative for fever or weight change.  Respiratory:  Negative for cough and shortness of breath.   Cardiovascular: [posotve  for chest pain intermittently ( usually in the morning and resolves by itself - discussed with cardiologist and given reassurance) but no  palpitations.  Gastrointestinal: Negative for abdominal pain, no bowel changes.  Musculoskeletal: Negative for gait problem or joint swelling.  Skin: Negative for rash.  Neurological: Negative for dizziness or headache.  No other specific complaints in a complete review of systems (except as listed in HPI above).  Objective  Vitals:   08/04/20 1016  BP: 140/72  Pulse: 85  Resp: 18  Temp: 98.5 F (36.9 C)  TempSrc: Oral  SpO2: 99%  Weight: 219 lb 8 oz (99.6 kg)  Height: 5\' 6"  (1.676 m)    Body mass index is 35.43 kg/m.  Physical Exam  Constitutional: Patient appears well-developed and well-nourished. Obese  No distress.  HEENT: head atraumatic, normocephalic, pupils equal and reactive to light,  neck supple Cardiovascular: Normal rate, regular rhythm and normal heart sounds.  No murmur heard. No BLE edema. Pulmonary/Chest: Effort normal and breath sounds normal. No respiratory distress. Abdominal: Soft.  There is no tenderness. Psychiatric: Patient has a normal mood and affect. behavior is normal. Judgment and thought content normal.  Recent Results (from the past 2160 hour(s))  POCT HgB A1C     Status: Abnormal   Collection Time: 08/04/20 10:23 AM  Result Value Ref Range   Hemoglobin A1C 6.5 (A) 4.0 - 5.6 %   HbA1c POC (<> result, manual entry)     HbA1c, POC (prediabetic range)     HbA1c, POC (controlled diabetic range)      Diabetic Foot Exam: Diabetic Foot Exam - Simple   Simple Foot Form Visual Inspection See comments: Yes Sensation Testing Intact to touch and monofilament testing bilaterally: Yes Pulse Check See comments: Yes Comments Weak distal pulses, discussed referral to vascular surgeon but he wants to hold off  Bunion formation, thick  toenails      PHQ2/9: Depression screen [facility] 2/9 08/04/2020 04/21/2020 04/04/2020 12/02/2019 08/21/2019  Decreased Interest 0 0 1 0 0  Down, Depressed, Hopeless 0 0 0 0 0  PHQ -  2 Score 0 0 1 0 0  Altered sleeping - - 0 0 0  Tired, decreased energy - - 0 0 0  Change in appetite - - 0 0 0  Feeling bad or failure about yourself  - - 0 0 0  Trouble concentrating - - 0 0 0  Moving slowly or fidgety/restless - - 0 0 0  Suicidal thoughts - - 0 0 0  PHQ-9 Score - - 1 0 0  Difficult doing work/chores - - Not difficult at all Not difficult at all Not difficult at all  Some recent data might be hidden    phq 9 is negative  Fall Risk: Fall Risk  08/04/2020 04/21/2020 04/04/2020 12/02/2019 08/21/2019  Falls in the past year? 0 0 0 0 0  Number falls in past yr: 0 0 0 0 0  Injury with Fall? 0 0 0 0 0  Risk for fall due to : - No Fall Risks - - -  Risk for fall due to: Comment - - - - -  Follow up - Falls prevention discussed - Falls evaluation completed -      Functional Status Survey: Is the patient deaf or have difficulty hearing?: Yes Does the patient have difficulty seeing, even when wearing glasses/contacts?: No Does the patient have difficulty concentrating, remembering, or making decisions?: No Does the patient have difficulty walking or climbing stairs?: No Does the patient have difficulty dressing or bathing?: No Does the patient have difficulty doing errands alone such as visiting a doctor's office or shopping?: No    Assessment & Plan  1. Diabetes mellitus type 2 in obese (HCC)  - POCT HgB A1C - glipiZIDE (GLUCOTROL XL) 2.5 MG 24 hr tablet; Take 1 tablet (2.5 mg total) by mouth daily with breakfast.  Dispense: 90 tablet; Refill: 1 - metFORMIN (GLUCOPHAGE-XR) 750 MG 24 hr tablet; Take 2 tablets (1,500 mg total) by mouth daily with breakfast.  Dispense: 180 tablet; Refill: 1  2. Essential hypertension  - amLODipine (NORVASC) 2.5 MG tablet; Take 1 tablet (2.5 mg total) by mouth  every evening.  Dispense: 90 tablet; Refill: 1 - metoprolol succinate (TOPROL-XL) 50 MG 24 hr tablet; Take 1 tablet (50 mg total) by mouth every evening.  Dispense: 90 tablet; Refill: 1 - potassium chloride SA (KLOR-CON) 20 MEQ tablet; Take 1 tablet (20 mEq total) by mouth daily.  Dispense: 90 tablet; Refill: 1 - valsartan-hydrochlorothiazide (DIOVAN-HCT) 320-25 MG tablet; Take 1 tablet by mouth daily.  Dispense: 90 tablet; Refill: 0  3. Hypertension associated with diabetes ([place])  - amLODipine (NORVASC) 2.5 MG tablet; Take 1 tablet (2.5 mg total) by mouth every evening.  Dispense: 90 tablet; Refill: 1 - glipiZIDE (GLUCOTROL XL) 2.5 MG 24 hr tablet; Take 1 tablet (2.5 mg total) by mouth daily with breakfast.  Dispense: 90 tablet; Refill: 1 - metoprolol succinate (TOPROL-XL) 50 MG 24 hr tablet; Take 1 tablet (50 mg total) by mouth every evening.  Dispense: 90 tablet; Refill: 1  4. Dyslipidemia associated with type 2 diabetes mellitus (HCC)  - atorvastatin (LIPITOR) 40 MG tablet; Take 1 tablet (40 mg total) by mouth daily.  Dispense: 90 tablet; Refill: 1 - metFORMIN (GLUCOPHAGE-XR) 750 MG 24 hr tablet; Take 2 tablets (1,500 mg total) by mouth daily with breakfast.  Dispense: 180 tablet; Refill: 1  5. Type 2 diabetes mellitus with microalbuminuria, without long-term current use of insulin (HCC)  - glipiZIDE (GLUCOTROL XL) 2.5 MG 24 hr tablet; Take 1 tablet (2.5 mg  total) by mouth daily with breakfast.  Dispense: 90 tablet; Refill: 1 - metFORMIN (GLUCOPHAGE-XR) 750 MG 24 hr tablet; Take 2 tablets (1,500 mg total) by mouth daily with breakfast.  Dispense: 180 tablet; Refill: 1  6. Morbid obesity ([place])  Discussed with the patient the risk posed by an increased BMI. Discussed importance of portion control, calorie counting and at least 150 minutes of physical activity weekly. Avoid sweet beverages and drink more water. Eat at least 6 servings of fruit and vegetables daily   7. Senile purpura  ([place])  Reassurance given   8. Chronic bronchitis, unspecified chronic bronchitis type (HCC)  Stable  9. Benign meningioma ([place])  Under the care of Dr. Lacinda Axon  10. Primary osteoarthritis involving multiple joints  stable  11. Obstructive sleep apnea on CPAP  Compliant   12. Claudication of both lower extremities ([place])  Refused referral to vascular sufeon

## 2020-08-04 ENCOUNTER — Ambulatory Visit (INDEPENDENT_AMBULATORY_CARE_PROVIDER_SITE_OTHER): Payer: Medicare Other | Admitting: Family Medicine

## 2020-08-04 ENCOUNTER — Encounter: Payer: Self-pay | Admitting: Family Medicine

## 2020-08-04 ENCOUNTER — Other Ambulatory Visit: Payer: Self-pay

## 2020-08-04 VITALS — BP 140/72 | HR 85 | Temp 98.5°F | Resp 18 | Ht 66.0 in | Wt 219.5 lb

## 2020-08-04 DIAGNOSIS — D692 Other nonthrombocytopenic purpura: Secondary | ICD-10-CM

## 2020-08-04 DIAGNOSIS — E1169 Type 2 diabetes mellitus with other specified complication: Secondary | ICD-10-CM

## 2020-08-04 DIAGNOSIS — E669 Obesity, unspecified: Secondary | ICD-10-CM | POA: Diagnosis not present

## 2020-08-04 DIAGNOSIS — G4733 Obstructive sleep apnea (adult) (pediatric): Secondary | ICD-10-CM | POA: Diagnosis not present

## 2020-08-04 DIAGNOSIS — M8949 Other hypertrophic osteoarthropathy, multiple sites: Secondary | ICD-10-CM

## 2020-08-04 DIAGNOSIS — E1159 Type 2 diabetes mellitus with other circulatory complications: Secondary | ICD-10-CM

## 2020-08-04 DIAGNOSIS — R809 Proteinuria, unspecified: Secondary | ICD-10-CM

## 2020-08-04 DIAGNOSIS — I152 Hypertension secondary to endocrine disorders: Secondary | ICD-10-CM

## 2020-08-04 DIAGNOSIS — E1129 Type 2 diabetes mellitus with other diabetic kidney complication: Secondary | ICD-10-CM

## 2020-08-04 DIAGNOSIS — J42 Unspecified chronic bronchitis: Secondary | ICD-10-CM

## 2020-08-04 DIAGNOSIS — M159 Polyosteoarthritis, unspecified: Secondary | ICD-10-CM

## 2020-08-04 DIAGNOSIS — D329 Benign neoplasm of meninges, unspecified: Secondary | ICD-10-CM

## 2020-08-04 DIAGNOSIS — I739 Peripheral vascular disease, unspecified: Secondary | ICD-10-CM

## 2020-08-04 DIAGNOSIS — I10 Essential (primary) hypertension: Secondary | ICD-10-CM | POA: Diagnosis not present

## 2020-08-04 DIAGNOSIS — Z9989 Dependence on other enabling machines and devices: Secondary | ICD-10-CM

## 2020-08-04 DIAGNOSIS — M150 Primary generalized (osteo)arthritis: Secondary | ICD-10-CM

## 2020-08-04 DIAGNOSIS — E785 Hyperlipidemia, unspecified: Secondary | ICD-10-CM

## 2020-08-04 LAB — POCT GLYCOSYLATED HEMOGLOBIN (HGB A1C): Hemoglobin A1C: 6.5 % — AB (ref 4.0–5.6)

## 2020-08-04 MED ORDER — GLIPIZIDE ER 2.5 MG PO TB24: 2.5000 mg | ORAL_TABLET | Freq: Every day | ORAL | 1 refills | Status: DC

## 2020-08-04 MED ORDER — ATORVASTATIN CALCIUM 40 MG PO TABS: 40.0000 mg | ORAL_TABLET | Freq: Every day | ORAL | 1 refills | Status: DC

## 2020-08-04 MED ORDER — AMLODIPINE BESYLATE 2.5 MG PO TABS: 2.5000 mg | ORAL_TABLET | Freq: Every evening | ORAL | 1 refills | Status: DC

## 2020-08-04 MED ORDER — POTASSIUM CHLORIDE CRYS ER 20 MEQ PO TBCR: 20.0000 meq | EXTENDED_RELEASE_TABLET | Freq: Every day | ORAL | 1 refills | Status: DC

## 2020-08-04 MED ORDER — METFORMIN HCL ER 750 MG PO TB24: 1500.0000 mg | ORAL_TABLET | Freq: Every day | ORAL | 1 refills | Status: DC

## 2020-08-04 MED ORDER — METOPROLOL SUCCINATE ER 50 MG PO TB24: 50.0000 mg | ORAL_TABLET | Freq: Every evening | ORAL | 1 refills | Status: DC

## 2020-08-04 MED ORDER — VALSARTAN-HYDROCHLOROTHIAZIDE 320-25 MG PO TABS: 1.0000 | ORAL_TABLET | Freq: Every day | ORAL | 0 refills | Status: DC

## 2020-08-04 NOTE — Patient Instructions (Addendum)
We are sending higher dose of Valsartan hctz from 3250/12.5  To 320/25, to get bp between 130-140/80's  Needs eye exam  Stopping Januvia and monitor glucose, fasting up to 140

## 2020-08-08 DIAGNOSIS — L570 Actinic keratosis: Secondary | ICD-10-CM | POA: Diagnosis not present

## 2020-08-08 DIAGNOSIS — X32XXXA Exposure to sunlight, initial encounter: Secondary | ICD-10-CM | POA: Diagnosis not present

## 2020-08-08 DIAGNOSIS — C44712 Basal cell carcinoma of skin of right lower limb, including hip: Secondary | ICD-10-CM | POA: Diagnosis not present

## 2020-08-08 DIAGNOSIS — D485 Neoplasm of uncertain behavior of skin: Secondary | ICD-10-CM | POA: Diagnosis not present

## 2020-08-08 DIAGNOSIS — D225 Melanocytic nevi of trunk: Secondary | ICD-10-CM | POA: Diagnosis not present

## 2020-08-08 DIAGNOSIS — D2271 Melanocytic nevi of right lower limb, including hip: Secondary | ICD-10-CM | POA: Diagnosis not present

## 2020-08-08 DIAGNOSIS — D2262 Melanocytic nevi of left upper limb, including shoulder: Secondary | ICD-10-CM | POA: Diagnosis not present

## 2020-08-08 DIAGNOSIS — D2261 Melanocytic nevi of right upper limb, including shoulder: Secondary | ICD-10-CM | POA: Diagnosis not present

## 2020-08-08 DIAGNOSIS — Z85828 Personal history of other malignant neoplasm of skin: Secondary | ICD-10-CM | POA: Diagnosis not present

## 2020-09-05 ENCOUNTER — Other Ambulatory Visit: Payer: Self-pay | Admitting: Cardiovascular Disease

## 2020-10-05 DIAGNOSIS — C44712 Basal cell carcinoma of skin of right lower limb, including hip: Secondary | ICD-10-CM | POA: Diagnosis not present

## 2020-12-05 ENCOUNTER — Other Ambulatory Visit: Payer: Self-pay | Admitting: Cardiovascular Disease

## 2020-12-05 NOTE — Progress Notes (Signed)
Name: Jason Farrell.   MRN: [id number]    DOB: 03-22-1941   Date:12/06/2020       Progress Note  Subjective  Chief Complaint  Follow Up  HPI  Meningioma: he had a partial resection done at [facility] by Dr. Lacinda Axon in 2020, he states he has been doing well, since last visit with Dr. Lacinda Axon he states his memory problems have resolved, no longer having confusion, he denies headaches, no blurred or double vision. He was seen by Dr. Lacinda Axon July 2021 and states only mild growth  DMII: he was diagnosed many years ago,he is taking Januvia, Metformin and Glipizide, glucose at home has been in the went up from 120's to 140's range fasting but it can go up to 170's  He denies hypoglycemic episode. Hedenies polyphagia or polydipsia. Last  A1C is 6.5 % , today is up to 7.3 %, we stopped Januvia due to cost on his last visit but he also states eating more carbs and will try to resume a diabetic diet and increase physical activity  Urine micro was negative , he has associated dyslipidemia, HTN and obesity . He is on ARB, statin therapy/  Claudication: he states he has aching on both legs when he walks, thigh and lower leg, he needs to take breaks, can walk at most one quarter a mild without stopping, discussed importance of seeing vascular surgeon but he would like to hold off for now. Continues statin therapy , needs to resume physical activity, he has noticed legs feels heavy when walking   Morbid obesity: based on BMI above 35 with multiple risk factors.weight is stable now, discussed importance of weight loss again and to resume a diabetic diet   He has DM, HTN, dyslipidemia, OA.   HTN: He had tachycardia and placed on amiodarone that was recently filled by Dr. Rockey Situ, he is on Toprol XL 50 mg daily  ,Norvasc 2.5 mg and Valsartan hctz 320/25 but bp is still not at goal, we will adjust dose of norvasc from 2.5 mg to 5 mg and monitor   COPD with history of tobacco use: he denies daily cough,mild SOB  with moderate activity, no wheezing, but wakes up feeling tight in his chest, improves with activity .Quit smoking in 1996, after MI.He never had to use inhalers, no flares in a while He takes breaks when doing yard work of shoveling snow.   S/p MI and and history of stent placement: sees Dr. Rockey Situ , he states he has noticed some chest pain in the morning that lasts a couple of minutes, started a few months ago, not associated sob, diaphoresis or palpitation , he has discussed it with Dr. Rockey Situ and given reassurance. Advised to call 911 if persists over 10 minutes, higher intensity or associated with other symptoms Unchanged on statin, beta blockers , ARB and aspirin   OSA: wearing CPAP every night, he has another machine and has been working now   Senile purpura:stable, reassurance given to patient . Taking aspirin . Unchanged   SVT: doing well on amiodarone and beta blocker, he sees cardiologist yearly - Dr Rockey Situ.  He denies any palpitation   OA knee: explained importance of increasing walking, he was advised to have a knee replacement a few years ago but he decided to hold off, he states symptoms worse on left side, but no recent effusion. 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   Excoriation left and arm: while doing yard work, due for Tdap  Patient Active Problem List   Diagnosis Date Noted  . Prolonged QT interval   . Hypomagnesemia   . PRES (posterior reversible encephalopathy syndrome) 08/30/2018  . Hypoalbuminemia due to protein-calorie malnutrition ([place])   . Hypokalemia   . Encephalopathy   . SVT (supraventricular tachycardia) ([place])   . Meningioma ([place])   . 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 Status post craniotomy 08/12/2018  . Intracranial mass 08/08/2018  . Senile purpura ([place]) 02/21/2018  . Type 2 diabetes mellitus with microalbuminuria, without long-term current use of insulin ([place]) 02/21/2018  . Type 2 diabetes mellitus without complications ([place]) [id number]  . 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 Obstructive sleep apnea on CPAP 12/09/2015  . Arthritis, degenerative 05/06/2015  . Gastro-esophageal reflux disease without esophagitis 05/06/2015  . CAD in native artery 01/25/2015  . Diabetes mellitus type 2 in obese ([place]) 08/16/2014  . Hyperlipidemia 08/16/2014  . Essential hypertension 08/16/2014  . Morbid obesity ([place]) 08/16/2014  . Stopped smoking with greater than 30 pack year history 08/16/2014  . COLD (chronic obstructive lung disease) ([place]) 08/16/2014    Past Surgical History:  Procedure Laterality Date  . CARDIAC CATHETERIZATION  2006   stent placement  . COLONOSCOPY    . CORONARY ANGIOPLASTY WITH STENT PLACEMENT  2006   stent x 2   . CORONARY ANGIOPLASTY WITH STENT PLACEMENT  1996   stent x 3   . CRANIOTOMY  08/12/2018  . FOOT SURGERY Right   . HALLUX VALGUS CHEILECTOMY Right 02/25/2015   Procedure: HALLUX VALGUS CHEILECTOMY;  Surgeon: [name], DPM;  Location: [facility] ORS;  Service: Podiatry;  Laterality: Right;    Family History  Problem Relation Age of Onset  . Hyperlipidemia Father   . Hypertension Father   . Alcohol abuse Father   . Cancer Sister        lung  . Hypertension Sister   . Heart disease Sister   . Hyperlipidemia Sister   . COPD Sister   . Cancer Sister        unknown    Social History   Tobacco Use  . Smoking status: Former Smoker    Packs/day: 1.50    Years: 35.00    Pack years: 52.50    Types: Cigarettes    Start date: 07/17/1959    Quit date: 08/27/1994    Years since quitting: 26.2  . Smokeless tobacco: Never Used  . Tobacco comment: smoking cessation materials not required  Substance Use Topics  . Alcohol  use: Not Currently    Alcohol/week: 0.0 standard drinks     Current Outpatient Medications:  .  acetaminophen (TYLENOL) 325 MG tablet, Take 1-2 tablets (325-650 mg total) by mouth every 4 (four) hours as needed for mild pain., Disp: , Rfl:  .  amiodarone (PACERONE) 200 MG tablet, Take 1 tablet (200 mg total) by mouth daily., Disp: 90 tablet, Rfl: 3 .  aspirin 81 MG tablet, Take 81 mg by mouth daily., Disp: , Rfl:  .  atorvastatin (LIPITOR) 40 MG tablet, Take 1 tablet (40 mg total) by mouth daily., Disp: 90 tablet, Rfl: 1 .  docusate sodium (COLACE) 100 MG capsule, Take 100 mg by mouth 2 (two) times  daily. Pt takes every other day, Disp: , Rfl:  .  ferrous sulfate 325 (65 FE) MG tablet, Take 1 tablet (325 mg total) by mouth 2 (two) times daily with a meal. Add colace 100 mg, Disp: 180 tablet, Rfl: 1 .  glipiZIDE (GLUCOTROL XL) 2.5 MG 24 hr tablet, Take 1 tablet (2.5 mg total) by mouth daily with breakfast., Disp: 90 tablet, Rfl: 1 .  glucose blood test strip, Use as instructed, Disp: 100 each, Rfl: 12 .  magnesium oxide (MAG-OX) 400 (241.3 Mg) MG tablet, Take 1 tablet (400 mg total) by mouth 2 (two) times daily., Disp: 60 tablet, Rfl: 0 .  metFORMIN (GLUCOPHAGE-XR) 750 MG 24 hr tablet, Take 2 tablets (1,500 mg total) by mouth daily with breakfast., Disp: 180 tablet, Rfl: 1 .  metoprolol succinate (TOPROL-XL) 50 MG 24 hr tablet, Take 1 tablet (50 mg total) by mouth every evening., Disp: 90 tablet, Rfl: 1 .  Multiple Vitamins-Minerals (CENTRUM SILVER PO), Take 1 tablet by mouth daily. , Disp: , Rfl:  .  omeprazole (PRILOSEC) 40 MG capsule, Take 1 capsule (40 mg total) by mouth daily., Disp: 90 capsule, Rfl: 1 .  potassium chloride SA (KLOR-CON) 20 MEQ tablet, Take 1 tablet (20 mEq total) by mouth daily., Disp: 90 tablet, Rfl: 1 .  amLODipine (NORVASC) 5 MG tablet, Take 1 tablet (5 mg total) by mouth every evening., Disp: 90 tablet, Rfl: 0 .  Cholecalciferol (VITAMIN D) 50 MCG (2000 UT) tablet, Take  2,000 Units by mouth daily., Disp: , Rfl:  .  valsartan-hydrochlorothiazide (DIOVAN-HCT) 320-25 MG tablet, Take 1 tablet by mouth daily., Disp: 90 tablet, Rfl: 0  No Known Allergies  I personally reviewed active problem list, medication list, allergies, family history, social history, health maintenance with the patient/caregiver today.   ROS  Constitutional: Negative for fever or weight change.  Respiratory: Negative for cough and shortness of breath.   Cardiovascular: Negative for chest pain or palpitations.  Gastrointestinal: Negative for abdominal pain, no bowel changes.  Musculoskeletal: positive  for gait problem but no  joint swelling.  Skin: Negative for rash.  Neurological: Negative for dizziness or headache.  No other specific complaints in a complete review of systems (except as listed in HPI above).  Objective  Vitals:   12/06/20 1034  BP: (!) 148/78  Pulse: (!) 58  Resp: 18  Temp: 97.9 F (36.6 C)  SpO2: 97%  Weight: 215 lb 6.4 oz (97.7 kg)  Height: 5\' 6"  (1.676 m)    Body mass index is 34.77 kg/m.  Physical Exam  Constitutional: Patient appears well-developed and well-nourished. Obese  No distress.  HEENT: head atraumatic, normocephalic, pupils equal and reactive to light, ears cerume impaction bilaterally,  neck supple Cardiovascular: Normal rate, regular rhythm and normal heart sounds.  No murmur heard. No BLE edema. Pulmonary/Chest: Effort normal and breath sounds normal. No respiratory distress. Abdominal: Soft.  There is no tenderness. Psychiatric: Patient has a normal mood and affect. behavior is normal. Judgment and thought content normal. Skin: AK's on arms , sees dermatologist , excoriation on right arm and right leg, no signs of infection  Diabetic Foot Exam: Diabetic Foot Exam - Simple   Simple Foot Form Diabetic Foot exam was performed with the following findings: Yes 12/06/2020 10:49 AM  Visual Inspection See comments: Yes Sensation  Testing Intact to touch and monofilament testing bilaterally: Yes Pulse Check Posterior Tibialis and Dorsalis pulse intact bilaterally: Yes Comments Thick toenails, tinea pedis of toe webs  PHQ2/9: Depression screen [facility] 2/9 12/06/2020 08/04/2020 04/21/2020 04/04/2020 12/02/2019  Decreased Interest 0 0 0 1 0  Down, Depressed, Hopeless 0 0 0 0 0  PHQ - 2 Score 0 0 0 1 0  Altered sleeping 0 - - 0 0  Tired, decreased energy 0 - - 0 0  Change in appetite 0 - - 0 0  Feeling bad or failure about yourself  0 - - 0 0  Trouble concentrating 0 - - 0 0  Moving slowly or fidgety/restless 0 - - 0 0  Suicidal thoughts 0 - - 0 0  PHQ-9 Score 0 - - 1 0  Difficult doing work/chores Not difficult at all - - Not difficult at all Not difficult at all  Some recent data might be hidden    phq 9 is negative   Fall Risk: Fall Risk  12/06/2020 08/04/2020 04/21/2020 04/04/2020 12/02/2019  Falls in the past year? 0 0 0 0 0  Number falls in past yr: 0 0 0 0 0  Injury with Fall? 0 0 0 0 0  Risk for fall due to : - - No Fall Risks - -  Risk for fall due to: Comment - - - - -  Follow up - - Falls prevention discussed - Falls evaluation completed     Functional Status Survey: Is the patient deaf or have difficulty hearing?: Yes Does the patient have difficulty seeing, even when wearing glasses/contacts?: No Does the patient have difficulty concentrating, remembering, or making decisions?: No Does the patient have difficulty walking or climbing stairs?: Yes Does the patient have difficulty dressing or bathing?: No Does the patient have difficulty doing errands alone such as visiting a doctor's office or shopping?: No    Assessment & Plan  1. Diabetes mellitus type 2 in obese (HCC)  - POCT HgB A1C - HM Diabetes Foot Exam - Ambulatory referral to Podiatry  2. Tinea pedis of both feet  - Ambulatory referral to Podiatry  3. Senile purpura ([place])   4. Dyslipidemia associated with type 2 diabetes  mellitus ([place])   5. Essential hypertension  - amLODipine (NORVASC) 5 MG tablet; Take 1 tablet (5 mg total) by mouth every evening.  Dispense: 90 tablet; Refill: 0 - valsartan-hydrochlorothiazide (DIOVAN-HCT) 320-25 MG tablet; Take 1 tablet by mouth daily.  Dispense: 90 tablet; Refill: 0  6. Hypertension associated with diabetes ([place])  - amLODipine (NORVASC) 5 MG tablet; Take 1 tablet (5 mg total) by mouth every evening.  Dispense: 90 tablet; Refill: 0   7. Morbid obesity ([place])   Discussed with the patient the risk posed by an increased BMI. Discussed importance of portion control, calorie counting and at least 150 minutes of physical activity weekly. Avoid sweet beverages and drink more water. Eat at least 6 servings of fruit and vegetables daily   8. Chronic bronchitis, unspecified chronic bronchitis type ([place])   9. Benign meningioma ([place])   10. Obstructive sleep apnea on CPAP   11. Claudication of both lower extremities ([place])  On statin and aspirin, dose not want to see vascular surgeon at this time  12. History of myocardial infarct at age less than 23 years   32. Primary osteoarthritis involving multiple joints  Stable    14. Bilateral impacted cerumen  Verbal consent given Possible side effects discussed with patient Ears were  lavaged with warm water and peroxide  Patient tolerated procedure well No complications   15. Excoriation  - Tdap vaccine greater than or equal  to 7yo IM  16. Need for Tdap vaccination  - Tdap vaccine greater than or equal to 7yo IM

## 2020-12-06 ENCOUNTER — Other Ambulatory Visit: Payer: Self-pay

## 2020-12-06 ENCOUNTER — Encounter: Payer: Self-pay | Admitting: Family Medicine

## 2020-12-06 ENCOUNTER — Ambulatory Visit (INDEPENDENT_AMBULATORY_CARE_PROVIDER_SITE_OTHER): Payer: Medicare Other | Admitting: Family Medicine

## 2020-12-06 VITALS — BP 148/78 | HR 58 | Temp 97.9°F | Resp 18 | Ht 66.0 in | Wt 215.4 lb

## 2020-12-06 DIAGNOSIS — H6123 Impacted cerumen, bilateral: Secondary | ICD-10-CM

## 2020-12-06 DIAGNOSIS — I152 Hypertension secondary to endocrine disorders: Secondary | ICD-10-CM

## 2020-12-06 DIAGNOSIS — E1159 Type 2 diabetes mellitus with other circulatory complications: Secondary | ICD-10-CM | POA: Diagnosis not present

## 2020-12-06 DIAGNOSIS — I739 Peripheral vascular disease, unspecified: Secondary | ICD-10-CM | POA: Diagnosis not present

## 2020-12-06 DIAGNOSIS — D692 Other nonthrombocytopenic purpura: Secondary | ICD-10-CM | POA: Diagnosis not present

## 2020-12-06 DIAGNOSIS — G4733 Obstructive sleep apnea (adult) (pediatric): Secondary | ICD-10-CM | POA: Diagnosis not present

## 2020-12-06 DIAGNOSIS — J42 Unspecified chronic bronchitis: Secondary | ICD-10-CM

## 2020-12-06 DIAGNOSIS — D329 Benign neoplasm of meninges, unspecified: Secondary | ICD-10-CM | POA: Diagnosis not present

## 2020-12-06 DIAGNOSIS — I252 Old myocardial infarction: Secondary | ICD-10-CM | POA: Diagnosis not present

## 2020-12-06 DIAGNOSIS — E1169 Type 2 diabetes mellitus with other specified complication: Secondary | ICD-10-CM | POA: Diagnosis not present

## 2020-12-06 DIAGNOSIS — I10 Essential (primary) hypertension: Secondary | ICD-10-CM | POA: Diagnosis not present

## 2020-12-06 DIAGNOSIS — T148XXA Other injury of unspecified body region, initial encounter: Secondary | ICD-10-CM | POA: Diagnosis not present

## 2020-12-06 DIAGNOSIS — M8949 Other hypertrophic osteoarthropathy, multiple sites: Secondary | ICD-10-CM | POA: Diagnosis not present

## 2020-12-06 DIAGNOSIS — Z23 Encounter for immunization: Secondary | ICD-10-CM

## 2020-12-06 DIAGNOSIS — E785 Hyperlipidemia, unspecified: Secondary | ICD-10-CM

## 2020-12-06 DIAGNOSIS — E119 Type 2 diabetes mellitus without complications: Secondary | ICD-10-CM

## 2020-12-06 DIAGNOSIS — M159 Polyosteoarthritis, unspecified: Secondary | ICD-10-CM

## 2020-12-06 DIAGNOSIS — M150 Primary generalized (osteo)arthritis: Secondary | ICD-10-CM

## 2020-12-06 DIAGNOSIS — E669 Obesity, unspecified: Secondary | ICD-10-CM

## 2020-12-06 DIAGNOSIS — Z9989 Dependence on other enabling machines and devices: Secondary | ICD-10-CM

## 2020-12-06 DIAGNOSIS — B353 Tinea pedis: Secondary | ICD-10-CM

## 2020-12-06 LAB — POCT GLYCOSYLATED HEMOGLOBIN (HGB A1C): Hemoglobin A1C: 7.3 % — AB (ref 4.0–5.6)

## 2020-12-06 MED ORDER — VALSARTAN-HYDROCHLOROTHIAZIDE 320-25 MG PO TABS: 1.0000 | ORAL_TABLET | Freq: Every day | ORAL | 0 refills | Status: DC

## 2020-12-06 MED ORDER — AMLODIPINE BESYLATE 5 MG PO TABS: 5.0000 mg | ORAL_TABLET | Freq: Every evening | ORAL | 0 refills | Status: DC

## 2020-12-06 NOTE — Patient Instructions (Signed)
Please double dose of amlodipine from 2.5 mg daily to 5 mg daily ( two pills) until new prescription of 5 mg arrives

## 2020-12-07 ENCOUNTER — Encounter: Payer: Self-pay | Admitting: Family Medicine

## 2020-12-20 ENCOUNTER — Other Ambulatory Visit: Payer: Self-pay

## 2020-12-20 ENCOUNTER — Ambulatory Visit (INDEPENDENT_AMBULATORY_CARE_PROVIDER_SITE_OTHER): Payer: Medicare Other | Admitting: Podiatry

## 2020-12-20 DIAGNOSIS — B351 Tinea unguium: Secondary | ICD-10-CM

## 2020-12-21 ENCOUNTER — Encounter: Payer: Self-pay | Admitting: Podiatry

## 2020-12-21 NOTE — Progress Notes (Signed)
Subjective:  Patient ID: Jason Farrell., male    DOB: 05-08-1941,  MRN: [id number]  Chief Complaint  Patient presents with  . Nail Problem    Nail discoloration     80 y.o. male presents with the above complaint.  Patient complains of thickened elongated dystrophic toenails x10.  Patient states that this has been present for quite some time.  He would like to discuss treatment options for fungus.  He is a diabetic with last A1c of 7.3.  He has never treated it in the past.  He has not taken any medication for it.  He has not seen anyone else prior to seeing me.  He does not have any pain to it.   Review of Systems: Negative except as noted in the HPI. Denies N/V/F/Ch.  Past Medical History:  Diagnosis Date  . Coronary artery disease   . Diabetes mellitus without complication ([place])   . Dyslipidemia   . GERD (gastroesophageal reflux disease)   . Hyperlipidemia   . Hypertension   . MI (myocardial infarction) ([place])   . Osteoarthritis     Current Outpatient Medications:  .  acetaminophen (TYLENOL) 325 MG tablet, Take 1-2 tablets (325-650 mg total) by mouth every 4 (four) hours as needed for mild pain., Disp: , Rfl:  .  amiodarone (PACERONE) 200 MG tablet, Take 1 tablet (200 mg total) by mouth daily., Disp: 90 tablet, Rfl: 3 .  amLODipine (NORVASC) 5 MG tablet, Take 1 tablet (5 mg total) by mouth every evening., Disp: 90 tablet, Rfl: 0 .  aspirin 81 MG tablet, Take 81 mg by mouth daily., Disp: , Rfl:  .  atorvastatin (LIPITOR) 40 MG tablet, Take 1 tablet (40 mg total) by mouth daily., Disp: 90 tablet, Rfl: 1 .  Cholecalciferol (VITAMIN D) 50 MCG (2000 UT) tablet, Take 2,000 Units by mouth daily., Disp: , Rfl:  .  docusate sodium (COLACE) 100 MG capsule, Take 100 mg by mouth 2 (two) times daily. Pt takes every other day, Disp: , Rfl:  .  ferrous sulfate 325 (65 FE) MG tablet, Take 1 tablet (325 mg total) by mouth 2 (two) times daily with a meal. Add colace 100 mg, Disp: 180  tablet, Rfl: 1 .  glipiZIDE (GLUCOTROL XL) 2.5 MG 24 hr tablet, Take 1 tablet (2.5 mg total) by mouth daily with breakfast., Disp: 90 tablet, Rfl: 1 .  glucose blood test strip, Use as instructed, Disp: 100 each, Rfl: 12 .  magnesium oxide (MAG-OX) 400 (241.3 Mg) MG tablet, Take 1 tablet (400 mg total) by mouth 2 (two) times daily., Disp: 60 tablet, Rfl: 0 .  metFORMIN (GLUCOPHAGE-XR) 750 MG 24 hr tablet, Take 2 tablets (1,500 mg total) by mouth daily with breakfast., Disp: 180 tablet, Rfl: 1 .  metoprolol succinate (TOPROL-XL) 50 MG 24 hr tablet, Take 1 tablet (50 mg total) by mouth every evening., Disp: 90 tablet, Rfl: 1 .  Multiple Vitamins-Minerals (CENTRUM SILVER PO), Take 1 tablet by mouth daily. , Disp: , Rfl:  .  omeprazole (PRILOSEC) 40 MG capsule, Take 1 capsule (40 mg total) by mouth daily., Disp: 90 capsule, Rfl: 1 .  potassium chloride SA (KLOR-CON) 20 MEQ tablet, Take 1 tablet (20 mEq total) by mouth daily., Disp: 90 tablet, Rfl: 1 .  valsartan-hydrochlorothiazide (DIOVAN-HCT) 320-25 MG tablet, Take 1 tablet by mouth daily., Disp: 90 tablet, Rfl: 0  Social History   Tobacco Use  Smoking Status Former Smoker  . Packs/day: 1.50  . Years: 35.00  .  Pack years: 52.50  . Types: Cigarettes  . Start date: 07/17/1959  . Quit date: 08/27/1994  . Years since quitting: 26.3  Smokeless Tobacco Never Used  Tobacco Comment   smoking cessation materials not required    No Known Allergies Objective:  There were no vitals filed for this visit. There is no height or weight on file to calculate BMI. Constitutional Well developed. Well nourished.  Vascular Dorsalis pedis pulses palpable bilaterally. Posterior tibial pulses palpable bilaterally. Capillary refill normal to all digits.  No cyanosis or clubbing noted. Pedal hair growth normal.  Neurologic Normal speech. Oriented to person, place, and time. Epicritic sensation to light touch grossly present bilaterally.  Dermatologic Nails  thickened elongated dystrophic mycotic discolored toenails x10.  No pain on palpation. Skin within normal limits  Orthopedic: Normal joint ROM without pain or crepitus bilaterally. No visible deformities. No bony tenderness.   Radiographs: None Assessment:   1. Onychomycosis due to dermatophyte   2. Nail fungus    Plan:  Patient was evaluated and treated and all questions answered.  Onychomycosis x10 -Educated the patient on the etiology of onychomycosis and various treatment options associated with improving the fungal load.  I explained to the patient that there is 3 treatment options available to treat the onychomycosis including topical, p.o., laser treatment.  Patient has elected to hold off on any therapy for now.  He will think about it and will get back to me.  Given his systemic issue as well as his age I would hold off on Lamisil for now as well.  He states understanding   No follow-ups on file.

## 2020-12-22 ENCOUNTER — Ambulatory Visit: Payer: Medicare Other | Admitting: Podiatry

## 2021-01-06 ENCOUNTER — Other Ambulatory Visit: Payer: Self-pay | Admitting: Neurosurgery

## 2021-01-06 ENCOUNTER — Other Ambulatory Visit (HOSPITAL_COMMUNITY): Payer: Self-pay | Admitting: Neurosurgery

## 2021-01-06 DIAGNOSIS — D329 Benign neoplasm of meninges, unspecified: Secondary | ICD-10-CM

## 2021-01-18 NOTE — Progress Notes (Signed)
Date:  01/20/2021   ID:  Jason Miles., DOB 1940-10-23, MRN [id number]  Patient Location:  [address] [place] [address]   Provider location:   [facility], [place] office  PCP:  Steele Sizer, MD  Cardiologist:  Arvid Right [facility]  Chief Complaint  Patient presents with   12 month follow up     Patient c/o right side chest pain most mornings. Medications reviewed by the patient verbally.     History of Present Illness:    Jason Wynter. is a 80 y.o. 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  On discussions today, reports that he has chronic Tightness in right pectoral area, discussed previously Happens when he wakes up in the morning, does not happen in the rest of the day Goes away quickly in the morning Walks the dog, no sx with exertion Push mows , no sx No exercise program  No tachycardia, on amiodarone  Activities are limited by chronic joint issues, knees, hips  Lab work reviewed A1c: 7.3 Total chol 127 LDL 56  EKG personally reviewed by myself on todays visit NSR rate 53 bpm, RBBB  Other past medical history reviewed In the hospital 09/2018 intermittent confusion with difficulty speaking and visual changes since 06/2018 work-up revealing large meningioma at level of cerebral aqueduct with vasogenic edema posterior medial temporal lobes and obstructive hydrocephalus.    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 Marylu Farrell.   Postop course  mental status changes with bouts of lethargy, confusion, problems with sleep-wake  disruption with delirium, loss of bilateral vision as well as issues with leukocytosis and SVT. Amiodarone and B-blocker for SVT   Sister with lung cancer now in remission Reports he stopped smoking 1996  He stopped smoking in 1996 after his MI . Smoked 38 yrs    Past Medical History:  Diagnosis Date   Coronary artery disease    Diabetes mellitus without complication ([place])    Dyslipidemia    GERD (gastroesophageal reflux disease)    Hyperlipidemia    Hypertension    MI (myocardial infarction) ([place])    Osteoarthritis    Past Surgical History:  Procedure Laterality Date   CARDIAC CATHETERIZATION  2006   stent placement   COLONOSCOPY     CORONARY ANGIOPLASTY WITH STENT PLACEMENT  2006   stent x 2    CORONARY ANGIOPLASTY WITH STENT PLACEMENT  1996   stent x 3    CRANIOTOMY  08/12/2018   FOOT SURGERY Right    HALLUX VALGUS CHEILECTOMY Right 02/25/2015   Procedure: HALLUX VALGUS CHEILECTOMY;  Surgeon: [name], DPM;  Location: [facility] ORS;  Service: Podiatry;  Laterality: Right;     Current Meds  Medication Sig   acetaminophen (TYLENOL) 325 MG tablet Take 1-2 tablets (325-650 mg total) by mouth every 4 (four) hours as needed for mild pain.   amiodarone (PACERONE) 200 MG tablet Take 1 tablet (200 mg total) by mouth daily.   amLODipine (NORVASC) 5 MG tablet Take 1 tablet (5 mg total) by mouth every evening.   aspirin 81 MG tablet  Take 81 mg by mouth daily.   atorvastatin (LIPITOR) 40 MG tablet Take 1 tablet (40 mg total) by mouth daily.   Cholecalciferol (VITAMIN D) 50 MCG (2000 UT) tablet Take 2,000 Units by mouth daily.   docusate sodium (COLACE) 100 MG capsule Take 100 mg by mouth 2 (two) times daily. Pt takes every other day   ferrous sulfate 325 (65 FE) MG tablet Take 1 tablet (325 mg total) by mouth 2 (two) times daily with a meal. Add colace 100 mg   glipiZIDE (GLUCOTROL XL) 2.5 MG 24 hr tablet Take 1 tablet (2.5 mg total) by mouth daily with breakfast.   glucose  blood test strip Use as instructed   magnesium oxide (MAG-OX) 400 (241.3 Mg) MG tablet Take 1 tablet (400 mg total) by mouth 2 (two) times daily.   metFORMIN (GLUCOPHAGE-XR) 750 MG 24 hr tablet Take 2 tablets (1,500 mg total) by mouth daily with breakfast.   metoprolol succinate (TOPROL-XL) 50 MG 24 hr tablet Take 1 tablet (50 mg total) by mouth every evening.   Multiple Vitamins-Minerals (CENTRUM SILVER PO) Take 1 tablet by mouth daily.    omeprazole (PRILOSEC) 40 MG capsule Take 1 capsule (40 mg total) by mouth daily.   potassium chloride SA (KLOR-CON) 20 MEQ tablet Take 1 tablet (20 mEq total) by mouth daily.   valsartan-hydrochlorothiazide (DIOVAN-HCT) 320-25 MG tablet Take 1 tablet by mouth daily.     Allergies:   Patient has no known allergies.   Social History   Tobacco Use   Smoking status: Former    Packs/day: 1.50    Years: 35.00    Pack years: 52.50    Types: Cigarettes    Start date: 07/17/1959    Quit date: 08/27/1994    Years since quitting: 26.4   Smokeless tobacco: Never   Tobacco comments:    smoking cessation materials not required  Vaping Use   Vaping Use: Never used  Substance Use Topics   Alcohol use: Not Currently    Alcohol/week: 0.0 standard drinks   Drug use: No     Family Hx: The patient's family history includes Alcohol abuse in his father; COPD in his sister; Cancer in his sister and sister; Heart disease in his sister; Hyperlipidemia in his father and sister; Hypertension in his father and sister.  ROS:   Please see the history of present illness.    Review of Systems  Constitutional: Negative.   Respiratory: Negative.    Cardiovascular: Negative.   Gastrointestinal: Negative.   Musculoskeletal: Negative.   Neurological: Negative.   Psychiatric/Behavioral: Negative.    All other systems reviewed and are negative.    Labs/Other Tests and Data Reviewed:    Recent Labs: 04/04/2020: ALT 18; BUN 14; Creat 0.82; Hemoglobin 15.8; Platelets 231;  Potassium 4.4; Sodium 141   Recent Lipid Panel Lab Results  Component Value Date/Time   CHOL 127 04/04/2020 11:22 AM   CHOL 126 11/07/2015 10:06 AM   TRIG 124 04/04/2020 11:22 AM   HDL 50 04/04/2020 11:22 AM   HDL 47 11/07/2015 10:06 AM   CHOLHDL 2.5 04/04/2020 11:22 AM   LDLCALC 56 04/04/2020 11:22 AM    Wt Readings from Last 3 Encounters:  01/20/21 211 lb (95.7 kg)  12/06/20 215 lb 6.4 oz (97.7 kg)  08/04/20 219 lb 8 oz (99.6 kg)     Exam:    BP 122/60 (BP Location: Left Arm, Patient Position: Sitting, Cuff Size: Normal)   Pulse (!) 53  Ht 5\' 6"  (8.315 m)   Wt 211 lb (95.7 kg)   SpO2 98%   BMI 34.06 kg/m    Constitutional:  oriented to person, place, and time. No distress.  HENT:  Head: Grossly normal Eyes:  no discharge. No scleral icterus.  Neck: No JVD, no carotid bruits  Cardiovascular: Regular rate and rhythm, no murmurs appreciated Pulmonary/Chest: Clear to auscultation bilaterally, no wheezes or rails Abdominal: Soft.  no distension.  no tenderness.  Musculoskeletal: Normal range of motion Neurological:  normal muscle tone. Coordination normal. No atrophy Skin: Skin warm and dry Psychiatric: normal affect, pleasant  ASSESSMENT & PLAN:     Coronary artery disease involving native coronary artery of native heart without angina pectoris - Currently with no symptoms of angina. No further workup at this time. Continue current medication regimen.  Essential hypertension - Blood pressure is well controlled on today's visit. No changes made to the medications.   Mixed hyperlipidemia Cholesterol is at goal on the current lipid regimen. No changes to the medications were made.   Controlled type 2 diabetes mellitus without complication, without long-term current use of insulin (HCC) Hemoglobin A1c 7.3 Stressed importance of aggressive low carbohydrate diet and walking program   Class 2 obesity due to excess calories without serious comorbidity in adult,  unspecified BMI We have encouraged continued exercise, careful diet management in an effort to lose weight.  Centrilobular emphysema ([place]) 38 years of smoking Chronic mild shortness of breath on heavy exertion Not on inhalers, recommended walking program for weight loss and conditioning   Obstructive sleep apnea on CPAP Stable, on CPAP  SVt On amiodarone and beta-blocker, will check TSH today   Total encounter time more than 25 minutes  Greater than 50% was spent in counseling and coordination of care with the patient    Signed, Ida Rogue, MD  01/20/2021 10:01 AM    [place] Office [address], [place], [place] [address]

## 2021-01-20 ENCOUNTER — Ambulatory Visit (INDEPENDENT_AMBULATORY_CARE_PROVIDER_SITE_OTHER): Payer: Medicare Other | Admitting: Cardiovascular Disease

## 2021-01-20 ENCOUNTER — Encounter: Payer: Self-pay | Admitting: Cardiovascular Disease

## 2021-01-20 ENCOUNTER — Other Ambulatory Visit: Payer: Self-pay

## 2021-01-20 VITALS — BP 122/60 | HR 53 | Ht 66.0 in | Wt 211.0 lb

## 2021-01-20 DIAGNOSIS — E1169 Type 2 diabetes mellitus with other specified complication: Secondary | ICD-10-CM | POA: Diagnosis not present

## 2021-01-20 DIAGNOSIS — E119 Type 2 diabetes mellitus without complications: Secondary | ICD-10-CM

## 2021-01-20 DIAGNOSIS — G4733 Obstructive sleep apnea (adult) (pediatric): Secondary | ICD-10-CM

## 2021-01-20 DIAGNOSIS — E669 Obesity, unspecified: Secondary | ICD-10-CM

## 2021-01-20 DIAGNOSIS — Z9989 Dependence on other enabling machines and devices: Secondary | ICD-10-CM

## 2021-01-20 DIAGNOSIS — I10 Essential (primary) hypertension: Secondary | ICD-10-CM

## 2021-01-20 DIAGNOSIS — J432 Centrilobular emphysema: Secondary | ICD-10-CM

## 2021-01-20 DIAGNOSIS — Z79899 Other long term (current) drug therapy: Secondary | ICD-10-CM

## 2021-01-20 DIAGNOSIS — I25118 Atherosclerotic heart disease of native coronary artery with other forms of angina pectoris: Secondary | ICD-10-CM

## 2021-01-20 NOTE — Patient Instructions (Addendum)
Medication Instructions:  No changes, please continue your current medications   If you need a refill on your cardiac medications before your next appointment, please call your pharmacy.   Lab work: TSH LABS WILL APPEAR ON MYCHART, ABNORMAL RESULTS WILL BE CALLED  Testing/Procedures: No new testing needed  Follow-Up: At [facility], you and your health needs are our priority.  As part of our continuing mission to provide you with exceptional heart care, we have created designated Provider Care Teams.  These Care Teams include your primary Cardiologist (physician) and Advanced Practice Providers (APPs -  Physician Assistants and Nurse Practitioners) who all work together to provide you with the care you need, when you need it.  You will need a follow up appointment in 12 months  Providers on your designated Care Team:   Murray Hodgkins, NP Christell Faith, PA-C Marrianne Mood, PA-C [place], [place]  COVID-19 Vaccine Information can be found at: [email or web address].uk For questions related to vaccine distribution or appointments, please email [email or web address] or call [phone].

## 2021-01-21 LAB — TSH: TSH: 0.843 u[IU]/mL (ref 0.450–4.500)

## 2021-01-23 ENCOUNTER — Ambulatory Visit
Admission: RE | Admit: 2021-01-23 | Discharge: 2021-01-23 | Disposition: A | Discharge status: Home / Self Care | Payer: Medicare Other | Source: Ambulatory Visit | Attending: Neurosurgery | Admitting: Neurosurgery

## 2021-01-23 ENCOUNTER — Other Ambulatory Visit: Payer: Self-pay

## 2021-01-23 DIAGNOSIS — Z982 Presence of cerebrospinal fluid drainage device: Secondary | ICD-10-CM | POA: Diagnosis not present

## 2021-01-23 DIAGNOSIS — D329 Benign neoplasm of meninges, unspecified: Secondary | ICD-10-CM

## 2021-01-23 DIAGNOSIS — G9389 Other specified disorders of brain: Secondary | ICD-10-CM | POA: Diagnosis not present

## 2021-01-23 MED ORDER — GADOBUTROL 1 MMOL/ML IV SOLN
10.0000 mL | Freq: Once | INTRAVENOUS | Status: AC | PRN
  Administered 2021-01-23: 10 mL via INTRAVENOUS

## 2021-02-01 ENCOUNTER — Telehealth: Payer: Self-pay | Admitting: *Deleted

## 2021-02-01 NOTE — Chronic Care Management (AMB) (Signed)
  Chronic Care Management   Outreach Note  02/01/2021 Name: Jason Farrell. MRN: [id number] DOB: 10/29/1940  Jason Farrell. is a 80 y.o. year old male who is a primary care patient of Steele Sizer, MD. I reached out to Jason Farrell. by phone today in response to a referral sent by Mr. Jason Mouton Jr.'s PCP, Dr. Ancil Boozer      An unsuccessful telephone outreach was attempted today. The patient was referred to the case management team for assistance with care management and care coordination.   Follow Up Plan: A HIPAA compliant phone message was left for the patient providing contact information and requesting a return call. The care management team will reach out to the patient again over the next 7 days.  If patient returns call to provider office, please advise to call [place] at [phone].  [place] Management

## 2021-02-02 ENCOUNTER — Other Ambulatory Visit: Payer: Self-pay | Admitting: Family Medicine

## 2021-02-02 DIAGNOSIS — K219 Gastro-esophageal reflux disease without esophagitis: Secondary | ICD-10-CM

## 2021-02-02 NOTE — Telephone Encounter (Signed)
Requested Prescriptions  Pending Prescriptions Disp Refills  . omeprazole (PRILOSEC) 40 MG capsule [Pharmacy Med Name: OMEPRAZOLE DR CAPS 40MG ] 90 capsule 0    Sig: TAKE 1 CAPSULE DAILY     Gastroenterology: Proton Pump Inhibitors Passed - 02/02/2021  1:11 AM      Passed - Valid encounter within last 12 months    Recent Outpatient Visits          1 month ago Diabetes mellitus type 2 in obese [facility])   [facility] [place], Drue Stager, MD   6 months ago Diabetes mellitus type 2 in obese [facility])   [facility] [place], Drue Stager, MD   10 months ago Diabetes mellitus type 2 in obese [facility])   [facility] Steele Sizer, MD   1 year ago Diabetes mellitus type 2 in obese [facility])   [facility] Steele Sizer, MD   1 year ago Diabetes mellitus type 2 in obese [facility])   [facility] Steele Sizer, MD      Future Appointments            In 2 months Ancil Boozer, Drue Stager, MD [facility] [place], [place]   In 2 months  [place]

## 2021-02-07 NOTE — Chronic Care Management (AMB) (Signed)
  Chronic Care Management   Outreach Note  02/07/2021 Name: Jason Farrell. MRN: [id number] DOB: 1940-10-24  Jason Farrell. is a 80 y.o. year old male who is a primary care patient of Steele Sizer, MD. I reached out to Jason Farrell. by phone today in response to a referral sent by Mr. Jason Mouton Jr.'s PCP, Dr. Ancil Boozer.      A second unsuccessful telephone outreach was attempted today. The patient was referred to the case management team for assistance with care management and care coordination.   Follow Up Plan: A HIPAA compliant phone message was left for the patient providing contact information and requesting a return call. The care management team will reach out to the patient again over the next 7 days.  If patient returns call to provider office, please advise to call [place] at [phone].  [place] Management  Direct Dial: [phone]

## 2021-02-14 DIAGNOSIS — D329 Benign neoplasm of meninges, unspecified: Secondary | ICD-10-CM | POA: Diagnosis not present

## 2021-02-14 NOTE — Chronic Care Management (AMB) (Signed)
  Chronic Care Management   Note  02/14/2021 Name: Jason Farrell. MRN: [id number] DOB: 27-May-1941  [name]. is a 80 y.o. year old male who is a primary care patient of Steele Sizer, MD. I reached out to [name]. by phone today in response to a referral sent by Mr. Purcell Mouton Jr.'s PCP, Dr. Ancil Boozer.      Mr. Pink was given information about Chronic Care Management services today including:  CCM service includes personalized support from designated clinical staff supervised by his physician, including individualized plan of care and coordination with other care providers 24/7 contact phone numbers for assistance for urgent and routine care needs. Service will only be billed when office clinical staff spend 20 minutes or more in a month to coordinate care. Only one practitioner may furnish and bill the service in a calendar month. The patient may stop CCM services at any time (effective at the end of the month) by phone call to the office staff. The patient will be responsible for cost sharing (co-pay) of up to 20% of the service fee (after annual deductible is met).  Patient did not agree to enrollment in care management services and does not wish to consider at this time.  Follow up plan: Patient declines further follow up and engagement by the care management team. Appropriate care team members and provider have been notified via electronic communication. The care management team is available to follow up with the patient after provider conversation with the patient regarding recommendation for care management engagement and subsequent re-referral to the care management team.   [place] Management  Direct Dial: [phone]

## 2021-03-06 DIAGNOSIS — X32XXXA Exposure to sunlight, initial encounter: Secondary | ICD-10-CM | POA: Diagnosis not present

## 2021-03-06 DIAGNOSIS — D485 Neoplasm of uncertain behavior of skin: Secondary | ICD-10-CM | POA: Diagnosis not present

## 2021-03-06 DIAGNOSIS — L570 Actinic keratosis: Secondary | ICD-10-CM | POA: Diagnosis not present

## 2021-03-06 DIAGNOSIS — Z08 Encounter for follow-up examination after completed treatment for malignant neoplasm: Secondary | ICD-10-CM | POA: Diagnosis not present

## 2021-03-06 DIAGNOSIS — D225 Melanocytic nevi of trunk: Secondary | ICD-10-CM | POA: Diagnosis not present

## 2021-03-06 DIAGNOSIS — C44219 Basal cell carcinoma of skin of left ear and external auricular canal: Secondary | ICD-10-CM | POA: Diagnosis not present

## 2021-03-06 DIAGNOSIS — C44329 Squamous cell carcinoma of skin of other parts of face: Secondary | ICD-10-CM | POA: Diagnosis not present

## 2021-03-06 DIAGNOSIS — L821 Other seborrheic keratosis: Secondary | ICD-10-CM | POA: Diagnosis not present

## 2021-03-06 DIAGNOSIS — Z85828 Personal history of other malignant neoplasm of skin: Secondary | ICD-10-CM | POA: Diagnosis not present

## 2021-03-06 DIAGNOSIS — R208 Other disturbances of skin sensation: Secondary | ICD-10-CM | POA: Diagnosis not present

## 2021-03-07 LAB — HM DIABETES EYE EXAM

## 2021-03-14 DIAGNOSIS — Z23 Encounter for immunization: Secondary | ICD-10-CM | POA: Diagnosis not present

## 2021-04-02 ENCOUNTER — Other Ambulatory Visit: Payer: Self-pay | Admitting: Family Medicine

## 2021-04-02 DIAGNOSIS — I10 Essential (primary) hypertension: Secondary | ICD-10-CM

## 2021-04-05 NOTE — Progress Notes (Signed)
Name: Jason Farrell.   MRN: [id number]    DOB: May 11, 1941   Date:04/06/2021       Progress Note  Subjective  Chief Complaint  Follow up   HPI  Meningioma: he had a partial resection done at [facility] by Dr. Lacinda Axon in 2020, he states he has been doing well, since last visit with Dr. Lacinda Axon he states his memory problems have resolved, no longer having confusion, he denies headaches, no blurred or double vision. He was seen by Dr. Lacinda Axon July 2022 and stable, going back for follow up in 18 months  MRI brain 07/22 1. 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 2. Stable appearance of small left frontal convexity meningioma. 3. 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     DMII: he was diagnosed many years ago, he is  taking Metformin and Glipizide, glucose at home has been in the went up from 120's to 140's range fasting but it can go up to 170's  He denies hypoglycemic episode.  He denies polyphagia or polydipsia.A1C went from 6.5 % to 7.3 % when we stopped Januvia , today is down to 6.8 % , he states he has been walking the dog for 10-15 minutes daily and also smaller portion sidze.  He is due for repeat urine micro  , he has associated dyslipidemia, HTN and obesity . He is on ARB, statin therapy and denies side effects of medications  Claudication: he states he has aching on both legs when he walks, thigh and lower leg, he states since walking the dog daily pain has decreased, not interested in seeing vascular surgeon    Obesity:  weight is down, wife has been carrying for her mother and has not been around their cooking as much, he has been eating less   HTN:  He had tachycardia and placed on amiodarone that was recently filled by Dr. Rockey Situ, he is on Toprol XL 50 mg daily  ,Norvasc 5 mg and Valsartan hctz 320/25 , bp at home has been around 140's, today is lower,  but denies dizziness , chest pain or palpitation    COPD with history of tobacco use: he denies daily cough, mild SOB with moderate activity, no wheezing, Quit smoking in 1996, after MI. Stable     S/p MI and and history of stent placement: sees Dr. Rockey Situ  last visit July 2022, no recent episodes of chest pain or palpitation. He is still taking  statin, beta blockers , ARB and aspirin    OSA: wearing CPAP , he is compliant with therapy  he has some pressure on his chest when he wakes up but resolves when he removed CPAP machine and cardiologist is aware    Senile purpura:stable, reassurance given to patient . Taking aspirin . Unchanged    SVT: doing well on amiodarone and beta blocker , he sees cardiologist yearly - Dr Rockey Situ.  He denies any palpitation lately , his last TSH was done 01/2021 and within normal limits   OA knee: explained importance of increasing walking, he was advised to have a knee replacement a few years ago but he decided to hold off, he states symptoms worse on left side, but no recent effusion. 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[place])    Status post craniotomy 08/12/2018   Intracranial mass 08/08/2018   Senile purpura ([place]) 02/21/2018   Type 2 diabetes mellitus  with microalbuminuria, without long-term current use of insulin ([place]) 02/21/2018   Type 2 diabetes mellitus without complications ([place]) 25/11/3974   Osteoarthritis of hip 11/04/2017   Obstructive sleep apnea on CPAP 12/09/2015   Arthritis, degenerative 05/06/2015   Gastro-esophageal reflux disease without esophagitis 05/06/2015   CAD in native artery 01/25/2015   Diabetes mellitus type 2 in obese ([place]) 08/16/2014   Hyperlipidemia 08/16/2014   Essential hypertension 08/16/2014   Morbid obesity ([place]) 08/16/2014   Stopped smoking with greater than 30 pack year history 08/16/2014   COLD (chronic obstructive lung disease) ([place]) 08/16/2014    Past Surgical History:  Procedure Laterality Date   CARDIAC CATHETERIZATION  2006   stent placement   COLONOSCOPY     CORONARY ANGIOPLASTY WITH STENT PLACEMENT  2006   stent x 2    CORONARY ANGIOPLASTY WITH STENT PLACEMENT  1996   stent x 3    CRANIOTOMY  08/12/2018   FOOT SURGERY Right    HALLUX VALGUS CHEILECTOMY Right 02/25/2015   Procedure: HALLUX VALGUS CHEILECTOMY;  Surgeon: [name], DPM;  Location: [facility] ORS;  Service: Podiatry;  Laterality: Right;    Family History  Problem Relation Age of Onset   Hyperlipidemia Father    Hypertension Father    Alcohol abuse Father    Cancer Sister        lung   Hypertension Sister    Heart disease Sister    Hyperlipidemia Sister    COPD Sister    Cancer Sister        unknown    Social History   Tobacco Use   Smoking status: Former    Packs/day: 1.50    Years: 35.00    Pack years: 52.50    Types: Cigarettes    Start date: 07/17/1959    Quit date: 08/27/1994    Years since quitting: 26.6   Smokeless tobacco: Never   Tobacco comments:    smoking cessation materials not required  Substance Use Topics   Alcohol use: Not Currently    Alcohol/week: 0.0 standard drinks     Current Outpatient Medications:    acetaminophen (TYLENOL) 325 MG tablet, Take 1-2 tablets (325-650 mg total)  by mouth every 4 (four) hours as needed for mild pain., Disp: , Rfl:    amiodarone (PACERONE) 200 MG tablet, Take 1 tablet (200 mg total) by mouth daily., Disp: 90 tablet, Rfl: 3   amLODipine (NORVASC) 2.5 MG tablet, Take by mouth., Disp: , Rfl:    amLODipine (NORVASC) 5 MG tablet, Take 1 tablet (5 mg total) by mouth every evening., Disp: 90 tablet, Rfl: 0   aspirin 81 MG tablet, Take 81 mg by mouth daily., Disp: , Rfl:    atorvastatin (LIPITOR) 40 MG tablet, Take 1 tablet (40  mg total) by mouth daily., Disp: 90 tablet, Rfl: 1   Cholecalciferol (VITAMIN D) 50 MCG (2000 UT) tablet, Take 2,000 Units by mouth daily., Disp: , Rfl:    docusate sodium (COLACE) 100 MG capsule, Take 100 mg by mouth 2 (two) times daily. Pt takes every other day, Disp: , Rfl:    ferrous sulfate 325 (65 FE) MG tablet, Take 1 tablet (325 mg total) by mouth 2 (two) times daily with a meal. Add colace 100 mg, Disp: 180 tablet, Rfl: 1   glipiZIDE (GLUCOTROL XL) 2.5 MG 24 hr tablet, Take 1 tablet (2.5 mg total) by mouth daily with breakfast., Disp: 90 tablet, Rfl: 1   glucose blood test strip, Use as instructed, Disp: 100 each, Rfl: 12   magnesium oxide (MAG-OX) 400 (241.3 Mg) MG tablet, Take 1 tablet (400 mg total) by mouth 2 (two) times daily., Disp: 60 tablet, Rfl: 0   metFORMIN (GLUCOPHAGE-XR) 750 MG 24 hr tablet, Take 2 tablets (1,500 mg total) by mouth daily with breakfast., Disp: 180 tablet, Rfl: 1   metoprolol succinate (TOPROL-XL) 50 MG 24 hr tablet, Take 1 tablet (50 mg total) by mouth every evening., Disp: 90 tablet, Rfl: 1   Multiple Vitamins-Minerals (CENTRUM SILVER PO), Take 1 tablet by mouth daily. , Disp: , Rfl:    omeprazole (PRILOSEC) 40 MG capsule, TAKE 1 CAPSULE DAILY, Disp: 90 capsule, Rfl: 0   potassium chloride SA (KLOR-CON) 20 MEQ tablet, Take 1 tablet (20 mEq total) by mouth daily., Disp: 90 tablet, Rfl: 1   valsartan-hydrochlorothiazide (DIOVAN-HCT) 320-25 MG tablet, TAKE 1 TABLET DAILY, Disp: 90 tablet,  Rfl: 0  No Known Allergies  I personally reviewed active problem list, medication list, allergies, family history, social history, health maintenance with the patient/caregiver today.   ROS  Constitutional: Negative for fever , positive for  weight change.  Respiratory: Negative for cough and shortness of breath.   Cardiovascular: Negative for chest pain or palpitations.  Gastrointestinal: Negative for abdominal pain, no bowel changes.  Musculoskeletal: Negative for gait problem or joint swelling.  Skin: Negative for rash.  Neurological: Negative for dizziness or headache.  No other specific complaints in a complete review of systems (except as listed in HPI above).   Objective  Vitals:   04/06/21 0930  BP: 118/70  Pulse: 65  Resp: 16  Temp: 97.6 F (36.4 C)  TempSrc: Oral  SpO2: 95%  Weight: 198 lb 6.4 oz (90 kg)  Height: 5\' 6"  (1.676 m)    Body mass index is 32.02 kg/m.  Physical Exam  Constitutional: Patient appears well-developed and well-nourished. Obese  No distress.  HEENT: head atraumatic, normocephalic, pupils equal and reactive to light, neck supple Cardiovascular: Normal rate, regular rhythm and normal heart sounds.  No murmur heard. No BLE edema. Pulmonary/Chest: Effort normal and breath sounds normal. No respiratory distress. Abdominal: Soft.  There is no tenderness. Skin: senile purpura on both arms  Psychiatric: Patient has a normal mood and affect. behavior is normal. Judgment and thought content normal.   Recent Results (from the past 2160 hour(s))  TSH     Status: None   Collection Time: 01/20/21 10:12 AM  Result Value Ref Range   TSH 0.843 0.450 - 4.500 uIU/mL  POCT HgB A1C     Status: Abnormal   Collection Time: 04/06/21  9:36 AM  Result Value Ref Range   Hemoglobin A1C 6.8 (A) 4.0 - 5.6 %   HbA1c POC (<> result, manual entry)  HbA1c, POC (prediabetic range)     HbA1c, POC (controlled diabetic range)        PHQ2/9: Depression  screen [facility] 2/9 04/06/2021 12/06/2020 08/04/2020 04/21/2020 04/04/2020  Decreased Interest 0 0 0 0 1  Down, Depressed, Hopeless 0 0 0 0 0  PHQ - 2 Score 0 0 0 0 1  Altered sleeping - 0 - - 0  Tired, decreased energy - 0 - - 0  Change in appetite - 0 - - 0  Feeling bad or failure about yourself  - 0 - - 0  Trouble concentrating - 0 - - 0  Moving slowly or fidgety/restless - 0 - - 0  Suicidal thoughts - 0 - - 0  PHQ-9 Score - 0 - - 1  Difficult doing work/chores - Not difficult at all - - Not difficult at all  Some recent data might be hidden    phq 9 is negative  Fall Risk: Fall Risk  04/06/2021 12/06/2020 08/04/2020 04/21/2020 04/04/2020  Falls in the past year? 0 0 0 0 0  Number falls in past yr: - 0 0 0 0  Injury with Fall? - 0 0 0 0  Risk for fall due to : - - - No Fall Risks -  Risk for fall due to: Comment - - - - -  Follow up Falls prevention discussed - - Falls prevention discussed -     Functional Status Survey: Is the patient deaf or have difficulty hearing?: Yes Does the patient have difficulty seeing, even when wearing glasses/contacts?: No Does the patient have difficulty concentrating, remembering, or making decisions?: No Does the patient have difficulty walking or climbing stairs?: No Does the patient have difficulty dressing or bathing?: No Does the patient have difficulty doing errands alone such as visiting a doctor's office or shopping?: No   Assessment & Plan  1. Diabetes mellitus type 2 in obese (HCC)  - POCT HgB A1C - Microalbumin / creatinine urine ratio - glipiZIDE (GLUCOTROL XL) 2.5 MG 24 hr tablet; Take 1 tablet (2.5 mg total) by mouth daily with breakfast.  Dispense: 90 tablet; Refill: 1 - metFORMIN (GLUCOPHAGE-XR) 750 MG 24 hr tablet; Take 2 tablets (1,500 mg total) by mouth daily with breakfast.  Dispense: 180 tablet; Refill: 1  2. Need for immunization against influenza  Had it at local pharmacy   3. Need for shingles vaccine  Sent rx to pharmacy    4. Essential hypertension  - COMPLETE METABOLIC PANEL WITH GFR - CBC with Differential/Platelet - amLODipine (NORVASC) 5 MG tablet; Take 1 tablet (5 mg total) by mouth every evening.  Dispense: 90 tablet; Refill: 1 - metoprolol succinate (TOPROL-XL) 50 MG 24 hr tablet; Take 1 tablet (50 mg total) by mouth every evening.  Dispense: 90 tablet; Refill: 1 - potassium chloride SA (KLOR-CON) 20 MEQ tablet; Take 1 tablet (20 mEq total) by mouth daily.  Dispense: 90 tablet; Refill: 1 - valsartan-hydrochlorothiazide (DIOVAN-HCT) 320-25 MG tablet; Take 1 tablet by mouth daily.  Dispense: 90 tablet; Refill: 0  5. Dyslipidemia associated with type 2 diabetes mellitus (HCC)  - Lipid panel - atorvastatin (LIPITOR) 40 MG tablet; Take 1 tablet (40 mg total) by mouth daily.  Dispense: 90 tablet; Refill: 1 - metFORMIN (GLUCOPHAGE-XR) 750 MG 24 hr tablet; Take 2 tablets (1,500 mg total) by mouth daily with breakfast.  Dispense: 180 tablet; Refill: 1  6. Obesity   Losing weight  7. Senile purpura (HCC)  Both arms, reassurance given  8. Hypertension  associated with diabetes ([place])  - amLODipine (NORVASC) 5 MG tablet; Take 1 tablet (5 mg total) by mouth every evening.  Dispense: 90 tablet; Refill: 1 - glipiZIDE (GLUCOTROL XL) 2.5 MG 24 hr tablet; Take 1 tablet (2.5 mg total) by mouth daily with breakfast.  Dispense: 90 tablet; Refill: 1 - metoprolol succinate (TOPROL-XL) 50 MG 24 hr tablet; Take 1 tablet (50 mg total) by mouth every evening.  Dispense: 90 tablet; Refill: 1  9. Chronic bronchitis, unspecified chronic bronchitis type ([place])   10. Obstructive sleep apnea on CPAP  Compliant   11. Benign meningioma ([place])  Stable, up to date with follow ups with neurosurgeon   12. Claudication of both lower extremities ([place])  Doing better with increase in activity  13. Primary osteoarthritis involving multiple joints   14. History of iron deficiency anemia  - CBC with Differential/Platelet -  Iron, TIBC and Ferritin Panel  15. Type 2 diabetes mellitus with microalbuminuria, without long-term current use of insulin (HCC)  - glipiZIDE (GLUCOTROL XL) 2.5 MG 24 hr tablet; Take 1 tablet (2.5 mg total) by mouth daily with breakfast.  Dispense: 90 tablet; Refill: 1 - metFORMIN (GLUCOPHAGE-XR) 750 MG 24 hr tablet; Take 2 tablets (1,500 mg total) by mouth daily with breakfast.  Dispense: 180 tablet; Refill: 1  16. Gastroesophageal reflux disease without esophagitis  - omeprazole (PRILOSEC) 40 MG capsule; Take 1 capsule (40 mg total) by mouth daily as needed.  Dispense: 90 capsule; Refill: 0  17. Lesion of thyroid gland  - US THYROID; Future

## 2021-04-06 ENCOUNTER — Encounter: Payer: Self-pay | Admitting: Family Medicine

## 2021-04-06 ENCOUNTER — Ambulatory Visit (INDEPENDENT_AMBULATORY_CARE_PROVIDER_SITE_OTHER): Payer: Medicare Other | Admitting: Family Medicine

## 2021-04-06 ENCOUNTER — Other Ambulatory Visit: Payer: Self-pay

## 2021-04-06 VITALS — BP 118/70 | HR 65 | Temp 97.6°F | Resp 16 | Ht 66.0 in | Wt 198.4 lb

## 2021-04-06 DIAGNOSIS — M8949 Other hypertrophic osteoarthropathy, multiple sites: Secondary | ICD-10-CM | POA: Diagnosis not present

## 2021-04-06 DIAGNOSIS — E0789 Other specified disorders of thyroid: Secondary | ICD-10-CM

## 2021-04-06 DIAGNOSIS — D329 Benign neoplasm of meninges, unspecified: Secondary | ICD-10-CM | POA: Diagnosis not present

## 2021-04-06 DIAGNOSIS — I739 Peripheral vascular disease, unspecified: Secondary | ICD-10-CM | POA: Diagnosis not present

## 2021-04-06 DIAGNOSIS — E079 Disorder of thyroid, unspecified: Secondary | ICD-10-CM

## 2021-04-06 DIAGNOSIS — G4733 Obstructive sleep apnea (adult) (pediatric): Secondary | ICD-10-CM

## 2021-04-06 DIAGNOSIS — E1159 Type 2 diabetes mellitus with other circulatory complications: Secondary | ICD-10-CM | POA: Diagnosis not present

## 2021-04-06 DIAGNOSIS — E1169 Type 2 diabetes mellitus with other specified complication: Secondary | ICD-10-CM

## 2021-04-06 DIAGNOSIS — Z862 Personal history of diseases of the blood and blood-forming organs and certain disorders involving the immune mechanism: Secondary | ICD-10-CM

## 2021-04-06 DIAGNOSIS — I10 Essential (primary) hypertension: Secondary | ICD-10-CM | POA: Diagnosis not present

## 2021-04-06 DIAGNOSIS — J42 Unspecified chronic bronchitis: Secondary | ICD-10-CM

## 2021-04-06 DIAGNOSIS — I25118 Atherosclerotic heart disease of native coronary artery with other forms of angina pectoris: Secondary | ICD-10-CM

## 2021-04-06 DIAGNOSIS — E669 Obesity, unspecified: Secondary | ICD-10-CM

## 2021-04-06 DIAGNOSIS — M159 Polyosteoarthritis, unspecified: Secondary | ICD-10-CM

## 2021-04-06 DIAGNOSIS — E785 Hyperlipidemia, unspecified: Secondary | ICD-10-CM | POA: Diagnosis not present

## 2021-04-06 DIAGNOSIS — D692 Other nonthrombocytopenic purpura: Secondary | ICD-10-CM | POA: Diagnosis not present

## 2021-04-06 DIAGNOSIS — Z9989 Dependence on other enabling machines and devices: Secondary | ICD-10-CM

## 2021-04-06 DIAGNOSIS — I152 Hypertension secondary to endocrine disorders: Secondary | ICD-10-CM

## 2021-04-06 DIAGNOSIS — E1129 Type 2 diabetes mellitus with other diabetic kidney complication: Secondary | ICD-10-CM

## 2021-04-06 DIAGNOSIS — Z23 Encounter for immunization: Secondary | ICD-10-CM | POA: Diagnosis not present

## 2021-04-06 DIAGNOSIS — R809 Proteinuria, unspecified: Secondary | ICD-10-CM

## 2021-04-06 DIAGNOSIS — K219 Gastro-esophageal reflux disease without esophagitis: Secondary | ICD-10-CM

## 2021-04-06 LAB — POCT GLYCOSYLATED HEMOGLOBIN (HGB A1C): Hemoglobin A1C: 6.8 % — AB (ref 4.0–5.6)

## 2021-04-06 MED ORDER — GLIPIZIDE ER 2.5 MG PO TB24: 2.5000 mg | ORAL_TABLET | Freq: Every day | ORAL | 1 refills | Status: DC

## 2021-04-06 MED ORDER — VALSARTAN-HYDROCHLOROTHIAZIDE 320-25 MG PO TABS: 1.0000 | ORAL_TABLET | Freq: Every day | ORAL | 0 refills | Status: DC

## 2021-04-06 MED ORDER — ATORVASTATIN CALCIUM 40 MG PO TABS: 40.0000 mg | ORAL_TABLET | Freq: Every day | ORAL | 1 refills | Status: DC

## 2021-04-06 MED ORDER — AMLODIPINE BESYLATE 5 MG PO TABS: 5.0000 mg | ORAL_TABLET | Freq: Every evening | ORAL | 1 refills | Status: DC

## 2021-04-06 MED ORDER — SHINGRIX 50 MCG/0.5ML IM SUSR
0.5000 mL | Freq: Once | INTRAMUSCULAR | 1 refills | Status: AC
Start: 2021-04-06 — End: 2021-04-06

## 2021-04-06 MED ORDER — METFORMIN HCL ER 750 MG PO TB24: 1500.0000 mg | ORAL_TABLET | Freq: Every day | ORAL | 1 refills | Status: DC

## 2021-04-06 MED ORDER — METOPROLOL SUCCINATE ER 50 MG PO TB24: 50.0000 mg | ORAL_TABLET | Freq: Every evening | ORAL | 1 refills | Status: DC

## 2021-04-06 MED ORDER — OMEPRAZOLE 40 MG PO CPDR: 40.0000 mg | DELAYED_RELEASE_CAPSULE | Freq: Every day | ORAL | 0 refills | Status: DC | PRN

## 2021-04-06 MED ORDER — POTASSIUM CHLORIDE CRYS ER 20 MEQ PO TBCR: 20.0000 meq | EXTENDED_RELEASE_TABLET | Freq: Every day | ORAL | 1 refills | Status: DC

## 2021-04-07 LAB — CBC WITH DIFFERENTIAL/PLATELET
Absolute Monocytes: 794 cells/uL (ref 200–950)
Basophils Absolute: 38 cells/uL (ref 0–200)
Basophils Relative: 0.6 %
Eosinophils Absolute: 164 cells/uL (ref 15–500)
Eosinophils Relative: 2.6 %
HCT: 43.6 % (ref 38.5–50.0)
Hemoglobin: 14.3 g/dL (ref 13.2–17.1)
Lymphs Abs: 1014 cells/uL (ref 850–3900)
MCH: 29.2 pg (ref 27.0–33.0)
MCHC: 32.8 g/dL (ref 32.0–36.0)
MCV: 89.2 fL (ref 80.0–100.0)
MPV: 10.9 fL (ref 7.5–12.5)
Monocytes Relative: 12.6 %
Neutro Abs: 4290 cells/uL (ref 1500–7800)
Neutrophils Relative %: 68.1 %
Platelets: 232 10*3/uL (ref 140–400)
RBC: 4.89 10*6/uL (ref 4.20–5.80)
RDW: 12.4 % (ref 11.0–15.0)
Total Lymphocyte: 16.1 %
WBC: 6.3 10*3/uL (ref 3.8–10.8)

## 2021-04-07 LAB — COMPLETE METABOLIC PANEL WITH GFR
AG Ratio: 1.8 (calc) (ref 1.0–2.5)
ALT: 20 U/L (ref 9–46)
AST: 15 U/L (ref 10–35)
Albumin: 3.8 g/dL (ref 3.6–5.1)
Alkaline phosphatase (APISO): 76 U/L (ref 35–144)
BUN: 15 mg/dL (ref 7–25)
CO2: 30 mmol/L (ref 20–32)
Calcium: 9.4 mg/dL (ref 8.6–10.3)
Chloride: 105 mmol/L (ref 98–110)
Creat: 0.81 mg/dL (ref 0.70–1.28)
Globulin: 2.1 g/dL (calc) (ref 1.9–3.7)
Glucose, Bld: 119 mg/dL — ABNORMAL HIGH (ref 65–99)
Potassium: 5.2 mmol/L (ref 3.5–5.3)
Sodium: 140 mmol/L (ref 135–146)
Total Bilirubin: 0.4 mg/dL (ref 0.2–1.2)
Total Protein: 5.9 g/dL — ABNORMAL LOW (ref 6.1–8.1)
eGFR: 90 mL/min/{1.73_m2} (ref >=60)

## 2021-04-07 LAB — IRON,TIBC AND FERRITIN PANEL
%SAT: 16 % (calc) — ABNORMAL LOW (ref 20–48)
Ferritin: 74 ng/mL (ref 24–380)
Iron: 47 ug/dL — ABNORMAL LOW (ref 50–180)
TIBC: 291 mcg/dL (calc) (ref 250–425)

## 2021-04-07 LAB — LIPID PANEL
Cholesterol: 92 mg/dL (ref <200)
HDL: 43 mg/dL (ref >=40)
LDL Cholesterol (Calc): 32 mg/dL (calc)
Non-HDL Cholesterol (Calc): 49 mg/dL (calc) (ref <130)
Total CHOL/HDL Ratio: 2.1 (calc) (ref <5.0)
Triglycerides: 87 mg/dL (ref <150)

## 2021-04-07 LAB — MICROALBUMIN / CREATININE URINE RATIO
Creatinine, Urine: 68 mg/dL (ref 20–320)
Microalb Creat Ratio: 26 mcg/mg creat (ref <30)
Microalb, Ur: 1.8 mg/dL

## 2021-04-25 ENCOUNTER — Ambulatory Visit (INDEPENDENT_AMBULATORY_CARE_PROVIDER_SITE_OTHER): Payer: Medicare Other

## 2021-04-25 ENCOUNTER — Other Ambulatory Visit: Payer: Self-pay

## 2021-04-25 VITALS — BP 148/62 | HR 66 | Temp 97.5°F | Resp 16 | Ht 66.0 in | Wt 195.2 lb

## 2021-04-25 DIAGNOSIS — Z0000 Encounter for general adult medical examination without abnormal findings: Secondary | ICD-10-CM | POA: Diagnosis not present

## 2021-04-25 NOTE — Progress Notes (Addendum)
Subjective:   Jason Darco. is a 80 y.o. male who presents for Medicare Annual/Subsequent preventive examination.  Review of Systems     Cardiac Risk Factors include: advanced age (>74men, >31 women);diabetes mellitus;dyslipidemia;male gender;hypertension;obesity (BMI >30kg/m2)     Objective:    Today's Vitals   04/25/21 1047 04/25/21 1049  BP: (!) 148/62   Pulse: 66   Resp: 16   Temp: (!) 97.5 F (36.4 C)   TempSrc: Oral   SpO2: 98%   Weight: 195 lb 3.2 oz (88.5 kg)   Height: 5\' 6"  (1.676 m)   PainSc:  0-No pain   Body mass index is 31.51 kg/m.  Advanced Directives 04/25/2021 04/21/2020 04/17/2019 09/26/2018 09/23/2018 09/04/2018 08/30/2018  Does Patient Have a Medical Advance Directive? No No No No No No No  Would patient like information on creating a medical advance directive? No - Patient declined No - Patient declined No - Patient declined - No - Patient declined No - Patient declined No - Patient declined    Current Medications (verified) Outpatient Encounter Medications as of 04/25/2021  Medication Sig   acetaminophen (TYLENOL) 325 MG tablet Take 1-2 tablets (325-650 mg total) by mouth every 4 (four) hours as needed for mild pain.   amiodarone (PACERONE) 200 MG tablet Take 1 tablet (200 mg total) by mouth daily.   amLODipine (NORVASC) 5 MG tablet Take 1 tablet (5 mg total) by mouth every evening.   aspirin 81 MG tablet Take 81 mg by mouth daily.   atorvastatin (LIPITOR) 40 MG tablet Take 1 tablet (40 mg total) by mouth daily.   Cholecalciferol (VITAMIN D) 50 MCG (2000 UT) tablet Take 2,000 Units by mouth daily.   docusate sodium (COLACE) 100 MG capsule Take 100 mg by mouth 2 (two) times daily. Pt takes every other day   ferrous sulfate 325 (65 FE) MG tablet Take 1 tablet (325 mg total) by mouth 2 (two) times daily with a meal. Add colace 100 mg   glipiZIDE (GLUCOTROL XL) 2.5 MG 24 hr tablet Take 1 tablet (2.5 mg total) by mouth daily with breakfast.   glucose  blood test strip Use as instructed   magnesium oxide (MAG-OX) 400 (241.3 Mg) MG tablet Take 1 tablet (400 mg total) by mouth 2 (two) times daily.   metFORMIN (GLUCOPHAGE-XR) 750 MG 24 hr tablet Take 2 tablets (1,500 mg total) by mouth daily with breakfast.   metoprolol succinate (TOPROL-XL) 50 MG 24 hr tablet Take 1 tablet (50 mg total) by mouth every evening.   Multiple Vitamins-Minerals (CENTRUM SILVER PO) Take 1 tablet by mouth daily.    omeprazole (PRILOSEC) 40 MG capsule Take 1 capsule (40 mg total) by mouth daily as needed.   potassium chloride SA (KLOR-CON) 20 MEQ tablet Take 1 tablet (20 mEq total) by mouth daily.   valsartan-hydrochlorothiazide (DIOVAN-HCT) 320-25 MG tablet Take 1 tablet by mouth daily.   No facility-administered encounter medications on file as of 04/25/2021.    Allergies (verified) Patient has no known allergies.   History: Past Medical History:  Diagnosis Date   Coronary artery disease    Diabetes mellitus without complication (HCC)    Dyslipidemia    GERD (gastroesophageal reflux disease)    Hyperlipidemia    Hypertension    MI (myocardial infarction) ([place])    Osteoarthritis    Past Surgical History:  Procedure Laterality Date   CARDIAC CATHETERIZATION  2006   stent placement   COLONOSCOPY     CORONARY ANGIOPLASTY WITH  STENT PLACEMENT  2006   stent x 2    CORONARY ANGIOPLASTY WITH STENT PLACEMENT  1996   stent x 3    CRANIOTOMY  08/12/2018   FOOT SURGERY Right    HALLUX VALGUS CHEILECTOMY Right 02/25/2015   Procedure: HALLUX VALGUS CHEILECTOMY;  Surgeon: [name], DPM;  Location: [facility] ORS;  Service: Podiatry;  Laterality: Right;   Family History  Problem Relation Age of Onset   Hyperlipidemia Father    Hypertension Father    Alcohol abuse Father    Cancer Sister        lung   Hypertension Sister    Heart disease Sister    Hyperlipidemia Sister    COPD Sister    Cancer Sister        unknown   Social History   Socioeconomic  History   Marital status: Married    Spouse name: Manuela Schwartz   Number of children: 3   Years of education: some college   Highest education level: 12th grade  Occupational History   Occupation: Retired  Tobacco Use   Smoking status: Former    Packs/day: 1.50    Years: 35.00    Pack years: 52.50    Types: Cigarettes    Start date: 07/17/1959    Quit date: 08/27/1994    Years since quitting: 26.6   Smokeless tobacco: Never   Tobacco comments:    smoking cessation materials not required  Vaping Use   Vaping Use: Never used  Substance and Sexual Activity   Alcohol use: Not Currently    Alcohol/week: 0.0 standard drinks   Drug use: No   Sexual activity: Not Currently    Partners: Female  Other Topics Concern   Not on file  Social History Narrative   Not on file   Social Determinants of Health   Financial Resource Strain: Low Risk    Difficulty of Paying Living Expenses: Not hard at all  Food Insecurity: No Food Insecurity   Worried About Charity fundraiser in the Last Year: Never true   [place] in the Last Year: Never true  Transportation Needs: No Transportation Needs   Lack of Transportation (Medical): No   Lack of Transportation (Non-Medical): No  Physical Activity: Inactive   Days of Exercise per Week: 0 days   Minutes of Exercise per Session: 0 min  Stress: No Stress Concern Present   Feeling of Stress : Not at all  Social Connections: Moderately Isolated   Frequency of Communication with Friends and Family: More than three times a week   Frequency of Social Gatherings with Friends and Family: More than three times a week   Attends Religious Services: Never   Marine scientist or Organizations: No   Attends Music therapist: Never   Marital Status: Married    Tobacco Counseling Counseling given: Not Answered Tobacco comments: smoking cessation materials not required   Clinical Intake:  Pre-visit preparation completed: Yes  Pain :  No/denies pain Pain Score: 0-No pain     BMI - recorded: 31.51 Nutritional Status: BMI > 30  Obese Nutritional Risks: None Diabetes: Yes CBG done?: No Did pt. bring in CBG monitor from home?: No  How often do you need to have someone help you when you read instructions, pamphlets, or other written materials from your doctor or pharmacy?: 1 - Never  Nutrition Risk Assessment:  Has the patient had any N/V/D within the last 2 months?  No  Does the patient have any non-healing wounds?  No  Has the patient had any unintentional weight loss or weight gain?  No   Diabetes:  Is the patient diabetic?  Yes  If diabetic, was a CBG obtained today?  No  Did the patient bring in their glucometer from home?  No  How often do you monitor your CBG's? Twice weekly per patient.   Financial Strains and Diabetes Management:  Are you having any financial strains with the device, your supplies or your medication? No .  Does the patient want to be seen by Chronic Care Management for management of their diabetes?  No  Would the patient like to be referred to a Nutritionist or for Diabetic Management?  No   Diabetic Exams:  Diabetic Eye Exam: Completed per patient [facility].   Diabetic Foot Exam: Completed 12/06/20.   Interpreter Needed?: No  Information entered by :: Clemetine Marker LPN   Activities of Daily Living In your present state of health, do you have any difficulty performing the following activities: 04/25/2021 04/06/2021  Hearing? Tempie Donning  Comment wears hearing aids -  Vision? N N  Difficulty concentrating or making decisions? N N  Walking or climbing stairs? N N  Dressing or bathing? N N  Doing errands, shopping? N N  Preparing Food and eating ? N -  Using the Toilet? N -  In the past six months, have you accidently leaked urine? N -  Do you have problems with loss of bowel control? N -  Managing your Medications? N -  Managing your Finances? N -  Housekeeping or managing your  Housekeeping? N -  Some recent data might be hidden    Patient Care Team: Steele Sizer, MD as PCP - General (Family Medicine) Minna Merritts, MD as Consulting Physician (Cardiology) Deetta Perla, MD as Consulting Physician (Neurosurgery)  Indicate any recent Medical Services you may have received from other than [facility] providers in the past year (date may be approximate).     Assessment:   This is a routine wellness examination for Jason Farrell.  Hearing/Vision screen Hearing Screening - Comments:: Pt wears hearing aids he purchased over the counter Vision Screening - Comments:: Annual vision screenings at [place] issues and exercise activities discussed: Current Exercise Habits: The patient does not participate in regular exercise at present, Exercise limited by: None identified   Goals Addressed             This Visit's Progress    DIET - INCREASE WATER INTAKE   On track    Recommend to drink at least 6-8 8oz glasses of water per day.       Depression Screen PHQ 2/9 Scores 04/25/2021 04/06/2021 12/06/2020 08/04/2020 04/21/2020 04/04/2020 12/02/2019  PHQ - 2 Score 0 0 0 0 0 1 0  PHQ- 9 Score - - 0 - - 1 0    Fall Risk Fall Risk  04/25/2021 04/06/2021 12/06/2020 08/04/2020 04/21/2020  Falls in the past year? 0 0 0 0 0  Number falls in past yr: 0 - 0 0 0  Injury with Fall? 0 - 0 0 0  Risk for fall due to : No Fall Risks - - - No Fall Risks  Risk for fall due to: Comment - - - - -  Follow up Falls prevention discussed Falls prevention discussed - - Falls prevention discussed    FALL RISK PREVENTION PERTAINING TO THE HOME:  Any stairs in or around the home? Yes  If so, are there any without handrails? No  Home free of loose throw rugs in walkways, pet beds, electrical cords, etc? Yes  Adequate lighting in your home to reduce risk of falls? Yes   ASSISTIVE DEVICES UTILIZED TO PREVENT FALLS:  Life alert? No  Use of a cane, walker or w/c? No  Grab bars in the  bathroom? Yes  Shower chair or bench in shower? No  Elevated toilet seat or a handicapped toilet? No   TIMED UP AND GO:  Was the test performed? Yes .  Length of time to ambulate 10 feet: 5 sec.   Gait steady and fast without use of assistive device  Cognitive Function: Normal cognitive status assessed by direct observation by this Nurse Health Advisor. No abnormalities found.       6CIT Screen 04/21/2020 04/17/2019 01/23/2018 12/03/2016  What Year? 0 points 0 points 0 points 0 points  What month? 0 points 0 points 0 points 0 points  What time? 0 points 0 points 0 points 0 points  Count back from 20 0 points 0 points 0 points 0 points  Months in reverse 0 points 0 points 2 points 2 points  Repeat phrase 0 points 0 points 2 points 4 points  Total Score 0 0 4 6    Immunizations Immunization History  Administered Date(s) Administered   Fluad Quad(high Dose 65+) 04/02/2019   Influenza, High Dose Seasonal PF 03/19/2018, 03/31/2021   Influenza, Seasonal, Injecte, Preservative Fre 05/10/2014   Influenza-Unspecified 04/15/2015, 03/21/2016, 04/02/2020   PFIZER(Purple Top)SARS-COV-2 Vaccination 09/05/2019, 09/29/2019, 03/31/2021   Pneumococcal Conjugate-13 07/10/2016   Pneumococcal Polysaccharide-23 07/22/2009   Tdap 12/06/2020    TDAP status: Up to date  Flu Vaccine status: Up to date  Pneumococcal vaccine status: Up to date  Covid-19 vaccine status: Completed vaccines  Qualifies for Shingles Vaccine? Yes   Zostavax completed No   Shingrix Completed?: No.    Education has been provided regarding the importance of this vaccine. Patient has been advised to call insurance company to determine out of pocket expense if they have not yet received this vaccine. Advised may also receive vaccine at local pharmacy or Health Dept. Verbalized acceptance and understanding.  Screening Tests Health Maintenance  Topic Date Due   OPHTHALMOLOGY EXAM  03/20/2018   Zoster Vaccines- Shingrix (1  of 2) 07/06/2021 (Originally 02/28/1960)   COVID-19 Vaccine (4 - Booster for Pfizer series) 06/23/2021   HEMOGLOBIN A1C  10/04/2021   FOOT EXAM  12/06/2021   TETANUS/TDAP  12/07/2030   INFLUENZA VACCINE  Completed   Hepatitis C Screening  Completed   HPV VACCINES  Aged Out    Health Maintenance  Health Maintenance Due  Topic Date Due   OPHTHALMOLOGY EXAM  03/20/2018    Colorectal cancer screening: No longer required.   Lung Cancer Screening: (Low Dose CT Chest recommended if Age 28-80 years, 30 pack-year currently smoking OR have quit w/in 15years.) does not qualify.  Additional Screening:  Hepatitis C Screening: does qualify; Completed 04/04/20  Vision Screening: Recommended annual ophthalmology exams for early detection of glaucoma and other disorders of the eye. Is the patient up to date with their annual eye exam?  Yes  Who is the provider or what is the name of the office in which the patient attends annual eye exams? [facility]   Dental Screening: Recommended annual dental exams for proper oral hygiene  Community Resource Referral / Chronic Care Management: CRR required this visit?  No   CCM required  this visit?  No      Plan:     I have personally reviewed and noted the following in the patient's chart:   Medical and social history Use of alcohol, tobacco or illicit drugs  Current medications and supplements including opioid prescriptions. Patient is not currently taking opioid prescriptions. Functional ability and status Nutritional status Physical activity Advanced directives List of other physicians Hospitalizations, surgeries, and ER visits in previous 12 months Vitals Screenings to include cognitive, depression, and falls Referrals and appointments  In addition, I have reviewed and discussed with patient certain preventive protocols, quality metrics, and best practice recommendations. A written personalized care plan for preventive services as well as  general preventive health recommendations were provided to patient.     Clemetine Marker, LPN   [id number]   Nurse Notes: none

## 2021-04-25 NOTE — Patient Instructions (Signed)
Jason Farrell , Thank you for taking time to come for your Medicare Wellness Visit. I appreciate your ongoing commitment to your health goals. Please review the following plan we discussed and let me know if I can assist you in the future.   Screening recommendations/referrals: Colonoscopy: no longer required Recommended yearly ophthalmology/optometry visit for glaucoma screening and checkup Recommended yearly dental visit for hygiene and checkup  Vaccinations: Influenza vaccine: done 03/31/21 Pneumococcal vaccine: done 07/10/16 Tdap vaccine: done 12/06/20 Shingles vaccine: Shingrix discussed. Please contact your pharmacy for coverage information.  Covid-19:  done 09/05/19, 09/29/19 & 03/31/21  Advanced directives: Advance directive discussed with you today. Even though you declined this today please call our office should you change your mind and we can give you the proper paperwork for you to fill out.   Conditions/risks identified: Keep up the great work!  Next appointment: Follow up in one year for your annual wellness visit.   Preventive Care 29 Years and Older, Male Preventive care refers to lifestyle choices and visits with your health care provider that can promote health and wellness. What does preventive care include? A yearly physical exam. This is also called an annual well check. Dental exams once or twice a year. Routine eye exams. Ask your health care provider how often you should have your eyes checked. Personal lifestyle choices, including: Daily care of your teeth and gums. Regular physical activity. Eating a healthy diet. Avoiding tobacco and drug use. Limiting alcohol use. Practicing safe sex. Taking low doses of aspirin every day. Taking vitamin and mineral supplements as recommended by your health care provider. What happens during an annual well check? The services and screenings done by your health care provider during your annual well check will depend on your  age, overall health, lifestyle risk factors, and family history of disease. Counseling  Your health care provider may ask you questions about your: Alcohol use. Tobacco use. Drug use. Emotional well-being. Home and relationship well-being. Sexual activity. Eating habits. History of falls. Memory and ability to understand (cognition). Work and work Statistician. Screening  You may have the following tests or measurements: Height, weight, and BMI. Blood pressure. Lipid and cholesterol levels. These may be checked every 5 years, or more frequently if you are over 74 years old. Skin check. Lung cancer screening. You may have this screening every year starting at age 36 if you have a 30-pack-year history of smoking and currently smoke or have quit within the past 15 years. Fecal occult blood test (FOBT) of the stool. You may have this test every year starting at age 18. Flexible sigmoidoscopy or colonoscopy. You may have a sigmoidoscopy every 5 years or a colonoscopy every 10 years starting at age 30. Prostate cancer screening. Recommendations will vary depending on your family history and other risks. Hepatitis C blood test. Hepatitis B blood test. Sexually transmitted disease (STD) testing. Diabetes screening. This is done by checking your blood sugar (glucose) after you have not eaten for a while (fasting). You may have this done every 1-3 years. Abdominal aortic aneurysm (AAA) screening. You may need this if you are a current or former smoker. Osteoporosis. You may be screened starting at age 10 if you are at high risk. Talk with your health care provider about your test results, treatment options, and if necessary, the need for more tests. Vaccines  Your health care provider may recommend certain vaccines, such as: Influenza vaccine. This is recommended every year. Tetanus, diphtheria, and acellular pertussis (Tdap,  Td) vaccine. You may need a Td booster every 10 years. Zoster  vaccine. You may need this after age 52. Pneumococcal 13-valent conjugate (PCV13) vaccine. One dose is recommended after age 59. Pneumococcal polysaccharide (PPSV23) vaccine. One dose is recommended after age 3. Talk to your health care provider about which screenings and vaccines you need and how often you need them. This information is not intended to replace advice given to you by your health care provider. Make sure you discuss any questions you have with your health care provider. Document Released: 07/29/2015 Document Revised: 03/21/2016 Document Reviewed: 05/03/2015 Elsevier Interactive Patient Education  2017 [employer] Prevention in the Home Falls can cause injuries. They can happen to people of all ages. There are many things you can do to make your home safe and to help prevent falls. What can I do on the outside of my home? Regularly fix the edges of walkways and driveways and fix any cracks. Remove anything that might make you trip as you walk through a door, such as a raised step or threshold. Trim any bushes or trees on the path to your home. Use bright outdoor lighting. Clear any walking paths of anything that might make someone trip, such as rocks or tools. Regularly check to see if handrails are loose or broken. Make sure that both sides of any steps have handrails. Any raised decks and porches should have guardrails on the edges. Have any leaves, snow, or ice cleared regularly. Use sand or salt on walking paths during winter. Clean up any spills in your garage right away. This includes oil or grease spills. What can I do in the bathroom? Use night lights. Install grab bars by the toilet and in the tub and shower. Do not use towel bars as grab bars. Use non-skid mats or decals in the tub or shower. If you need to sit down in the shower, use a plastic, non-slip stool. Keep the floor dry. Clean up any water that spills on the floor as soon as it happens. Remove  soap buildup in the tub or shower regularly. Attach bath mats securely with double-sided non-slip rug tape. Do not have throw rugs and other things on the floor that can make you trip. What can I do in the bedroom? Use night lights. Make sure that you have a light by your bed that is easy to reach. Do not use any sheets or blankets that are too big for your bed. They should not hang down onto the floor. Have a firm chair that has side arms. You can use this for support while you get dressed. Do not have throw rugs and other things on the floor that can make you trip. What can I do in the kitchen? Clean up any spills right away. Avoid walking on wet floors. Keep items that you use a lot in easy-to-reach places. If you need to reach something above you, use a strong step stool that has a grab bar. Keep electrical cords out of the way. Do not use floor polish or wax that makes floors slippery. If you must use wax, use non-skid floor wax. Do not have throw rugs and other things on the floor that can make you trip. What can I do with my stairs? Do not leave any items on the stairs. Make sure that there are handrails on both sides of the stairs and use them. Fix handrails that are broken or loose. Make sure that handrails are as  long as the stairways. Check any carpeting to make sure that it is firmly attached to the stairs. Fix any carpet that is loose or worn. Avoid having throw rugs at the top or bottom of the stairs. If you do have throw rugs, attach them to the floor with carpet tape. Make sure that you have a light switch at the top of the stairs and the bottom of the stairs. If you do not have them, ask someone to add them for you. What else can I do to help prevent falls? Wear shoes that: Do not have high heels. Have rubber bottoms. Are comfortable and fit you well. Are closed at the toe. Do not wear sandals. If you use a stepladder: Make sure that it is fully opened. Do not climb a  closed stepladder. Make sure that both sides of the stepladder are locked into place. Ask someone to hold it for you, if possible. Clearly mark and make sure that you can see: Any grab bars or handrails. First and last steps. Where the edge of each step is. Use tools that help you move around (mobility aids) if they are needed. These include: Canes. Walkers. Scooters. Crutches. Turn on the lights when you go into a dark area. Replace any light bulbs as soon as they burn out. Set up your furniture so you have a clear path. Avoid moving your furniture around. If any of your floors are uneven, fix them. If there are any pets around you, be aware of where they are. Review your medicines with your doctor. Some medicines can make you feel dizzy. This can increase your chance of falling. Ask your doctor what other things that you can do to help prevent falls. This information is not intended to replace advice given to you by your health care provider. Make sure you discuss any questions you have with your health care provider. Document Released: 04/28/2009 Document Revised: 12/08/2015 Document Reviewed: 08/06/2014 Elsevier Interactive Patient Education  2017 [employer].

## 2021-06-26 ENCOUNTER — Other Ambulatory Visit: Payer: Self-pay | Admitting: Family Medicine

## 2021-06-26 DIAGNOSIS — I10 Essential (primary) hypertension: Secondary | ICD-10-CM

## 2021-07-04 DIAGNOSIS — C44329 Squamous cell carcinoma of skin of other parts of face: Secondary | ICD-10-CM | POA: Diagnosis not present

## 2021-07-04 DIAGNOSIS — L905 Scar conditions and fibrosis of skin: Secondary | ICD-10-CM | POA: Diagnosis not present

## 2021-07-27 ENCOUNTER — Other Ambulatory Visit: Payer: Self-pay | Admitting: Family Medicine

## 2021-07-27 DIAGNOSIS — K219 Gastro-esophageal reflux disease without esophagitis: Secondary | ICD-10-CM

## 2021-07-27 NOTE — Telephone Encounter (Signed)
Requested Prescriptions  Pending Prescriptions Disp Refills   omeprazole (PRILOSEC) 40 MG capsule [Pharmacy Med Name: OMEPRAZOLE DR CAPS 40MG ] 90 capsule 3    Sig: TAKE 1 CAPSULE DAILY AS NEEDED     Gastroenterology: Proton Pump Inhibitors Passed - 07/27/2021  1:08 AM      Passed - Valid encounter within last 12 months    Recent Outpatient Visits          3 months ago Diabetes mellitus type 2 in obese [facility])   [facility] [place], Drue Stager, MD   7 months ago Diabetes mellitus type 2 in obese [facility])   [facility] [place], Drue Stager, MD   11 months ago Diabetes mellitus type 2 in obese [facility])   [facility] Steele Sizer, MD   1 year ago Diabetes mellitus type 2 in obese [facility])   [facility] Steele Sizer, MD   1 year ago Diabetes mellitus type 2 in obese [facility])   [facility] Steele Sizer, MD      Future Appointments            In 1 week Steele Sizer, MD [facility] [place], [place]   In 9 months  [facility], [facility]

## 2021-08-07 NOTE — Progress Notes (Signed)
Name: Jason Farrell.   MRN: [id number]    DOB: Jul 04, 1941   Date:08/08/2021       Progress Note  Subjective  Chief Complaint  Follow Up  HPI  Meningioma: he had a partial resection done at [facility] by Dr. Lacinda Axon in 2020, he states he has been doing well, since last visit with Dr. Lacinda Axon he states his memory problems have resolved, no longer having confusion, he denies headaches, no blurred or double vision. He is going back to see Dr. Lacinda Axon this Summer   MRI brain 07/22 1. 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 2. Stable appearance of small left frontal convexity meningioma. 3. 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 He states his wife has been using sugar substitute in the kitchen, they bought a new house and he has been more active doing work around the house. He denies hypoglycemic episodes   He denies polyphagia or polydipsia.A1C went from 6.5 % to 7.3 % when we stopped Januvia , and last visit it was 6.8 % today it is down to 5.8 % .  He has associated dyslipidemia, HTN and obesity . He is on ARB, statin therapy and denies side effects of medications  Claudication: he states he has aching on both legs when he walks, thigh and lower leg, he states since walking the dog daily pain has decreased, not interested in seeing vascular surgeon . Currently just has some knee pain after activity    Obesity:  weight is stable since last visit, wife is still carrying for her mother but now her mother in law is living with them and wife has been able to cook again    HTN:  He had tachycardia and placed on amiodarone that was recently filled by Dr. Rockey Situ, he is on Toprol XL 50 mg daily, Norvasc 5 mg and Valsartan hctz  320/25 , bp at home has been around 130's/80's, he denies dizziness , chest pain or palpitation    COPD with history of tobacco use: he denies daily cough, mild SOB with moderate activity, no wheezing, Quit smoking in 1996, after MI. 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 Rockey Situ  last visit July 2022, no recent episodes of chest pain or palpitation. He is still taking  statin and last LDL was down to 32 , beta blockers , ARB and aspirin . 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 Reassurance given    SVT: doing well on amiodarone and beta blocker , he sees cardiologist yearly - Dr Rockey Situ.  He denies any palpitation lately , his last TSH was done 01/2021 and within normal limits . 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   Hearing loss: he recently got an otc hearing aid and is doing well . Unchanged   History of iron deficiency anemia: diagnosed 2021, hemoccult stools negative times 3, he has been taking iron supplementation, denies pica, last level was normal . Advised to cut down on iron supplementation to three times weekly   Patient Active Problem List   Diagnosis Date Noted   Prolonged QT interval    PRES (posterior reversible encephalopathy syndrome) 08/30/2018   Hypoalbuminemia due to protein-calorie malnutrition (HCC)    SVT (supraventricular tachycardia) (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[place])    Status post craniotomy 08/12/2018   Senile purpura ([place]) 02/21/2018   Type 2 diabetes mellitus with microalbuminuria, without long-term current use of insulin ([place]) 02/21/2018   Osteoarthritis of hip 11/04/2017   Obstructive sleep apnea on CPAP 12/09/2015   Arthritis, degenerative 05/06/2015   Gastro-esophageal reflux disease without esophagitis 05/06/2015   CAD in native artery 01/25/2015   Diabetes mellitus type 2 in obese ([place]) 08/16/2014   Hyperlipidemia 08/16/2014   Essential hypertension 08/16/2014   Morbid obesity ([place]) 08/16/2014   Stopped smoking with greater than 30 pack year history 08/16/2014   COLD (chronic obstructive lung disease) ([place]) 08/16/2014    Past Surgical History:  Procedure Laterality Date   CARDIAC CATHETERIZATION  2006   stent placement   COLONOSCOPY     CORONARY ANGIOPLASTY WITH STENT PLACEMENT  2006   stent x 2    CORONARY ANGIOPLASTY WITH STENT PLACEMENT  1996   stent x 3    CRANIOTOMY  08/12/2018   FOOT SURGERY Right    HALLUX VALGUS CHEILECTOMY Right 02/25/2015   Procedure: HALLUX VALGUS CHEILECTOMY;  Surgeon: [name], DPM;  Location: [facility] ORS;  Service: Podiatry;  Laterality: Right;    Family History  Problem Relation Age of Onset   Hyperlipidemia Father    Hypertension Father    Alcohol abuse Father    Cancer Sister        lung   Hypertension Sister    Heart disease Sister    Hyperlipidemia Sister    COPD Sister    Cancer Sister        unknown    Social History   Tobacco Use   Smoking status: Former    Packs/day: 1.50    Years: 35.00    Pack years: 52.50    Types: Cigarettes    Start date: 07/17/1959    Quit date: 08/27/1994    Years since quitting: 26.9   Smokeless tobacco: Never   Tobacco comments:    smoking cessation materials not required  Substance Use Topics   Alcohol use: Not Currently    Alcohol/week: 0.0 standard drinks     Current  Outpatient Medications:    acetaminophen (TYLENOL) 325 MG tablet, Take 1-2 tablets (325-650 mg total) by mouth every 4 (four) hours as needed for mild pain., Disp: , Rfl:    amiodarone (PACERONE) 200 MG tablet, Take 1 tablet (200 mg total) by mouth daily., Disp: 90 tablet, Rfl: 3   amLODipine (NORVASC) 5 MG tablet, Take 1 tablet (5 mg total) by mouth every evening., Disp: 90 tablet, Rfl: 1   aspirin 81 MG tablet, Take 81 mg by mouth daily., Disp: , Rfl:    atorvastatin (LIPITOR) 40 MG  tablet, Take 1 tablet (40 mg total) by mouth daily., Disp: 90 tablet, Rfl: 1   Cholecalciferol (VITAMIN D) 50 MCG (2000 UT) tablet, Take 2,000 Units by mouth daily., Disp: , Rfl:    docusate sodium (COLACE) 100 MG capsule, Take 100 mg by mouth 2 (two) times daily. Pt takes every other day, Disp: , Rfl:    ferrous sulfate 325 (65 FE) MG tablet, Take 1 tablet (325 mg total) by mouth 2 (two) times daily with a meal. Add colace 100 mg, Disp: 180 tablet, Rfl: 1   glipiZIDE (GLUCOTROL XL) 2.5 MG 24 hr tablet, Take 1 tablet (2.5 mg total) by mouth daily with breakfast., Disp: 90 tablet, Rfl: 1   glucose blood test strip, Use as instructed, Disp: 100 each, Rfl: 12   magnesium oxide (MAG-OX) 400 (241.3 Mg) MG tablet, Take 1 tablet (400 mg total) by mouth 2 (two) times daily., Disp: 60 tablet, Rfl: 0   metFORMIN (GLUCOPHAGE-XR) 750 MG 24 hr tablet, Take 2 tablets (1,500 mg total) by mouth daily with breakfast., Disp: 180 tablet, Rfl: 1   metoprolol succinate (TOPROL-XL) 50 MG 24 hr tablet, Take 1 tablet (50 mg total) by mouth every evening., Disp: 90 tablet, Rfl: 1   Multiple Vitamins-Minerals (CENTRUM SILVER PO), Take 1 tablet by mouth daily. , Disp: , Rfl:    omeprazole (PRILOSEC) 40 MG capsule, TAKE 1 CAPSULE DAILY AS NEEDED, Disp: 90 capsule, Rfl: 2   potassium chloride SA (KLOR-CON) 20 MEQ tablet, Take 1 tablet (20 mEq total) by mouth daily., Disp: 90 tablet, Rfl: 1   valsartan-hydrochlorothiazide (DIOVAN-HCT) 320-25 MG  tablet, TAKE 1 TABLET DAILY, Disp: 90 tablet, Rfl: 0  No Known Allergies  I personally reviewed active problem list, medication list, allergies, family history, social history, health maintenance with the patient/caregiver today.   ROS  Constitutional: Negative for fever or weight change.  Respiratory: Negative for cough and shortness of breath.   Cardiovascular: Negative for chest pain or palpitations.  Gastrointestinal: Negative for abdominal pain, no bowel changes.  Musculoskeletal: Negative for gait problem or joint swelling.  Skin: Negative for rash.  Neurological: Negative for dizziness or headache.  No other specific complaints in a complete review of systems (except as listed in HPI above).   Objective  Vitals:   08/08/21 0926  BP: 136/64  Pulse: 65  Resp: 16  Temp: 97.9 F (36.6 C)  SpO2: 98%  Weight: 195 lb (88.5 kg)  Height: 5\' 6"  (1.676 m)    Body mass index is 31.47 kg/m.  Physical Exam  Constitutional: Patient appears well-developed and well-nourished. Obese  No distress.  HEENT: head atraumatic, normocephalic, pupils equal and reactive to light,, neck supple Cardiovascular: Normal rate, regular rhythm and normal heart sounds.  No murmur heard. No BLE edema. Pulmonary/Chest: Effort normal and breath sounds normal. No respiratory distress. Abdominal: Soft.  There is no tenderness. Psychiatric: Patient has a normal mood and affect. behavior is normal. Judgment and thought content normal.   Recent Results (from the past 2160 hour(s))  POCT HgB A1C     Status: Abnormal   Collection Time: 08/08/21  9:36 AM  Result Value Ref Range   Hemoglobin A1C 5.8 (A) 4.0 - 5.6 %   HbA1c POC (<> result, manual entry)     HbA1c, POC (prediabetic range)     HbA1c, POC (controlled diabetic range)       PHQ2/9: Depression screen [facility] 2/9 08/08/2021 04/25/2021 04/06/2021 12/06/2020 08/04/2020  Decreased Interest 0 0 0  0 0  Down, Depressed, Hopeless 0 0 0 0 0  PHQ - 2 Score  0 0 0 0 0  Altered sleeping 0 - - 0 -  Tired, decreased energy 0 - - 0 -  Change in appetite 0 - - 0 -  Feeling bad or failure about yourself  0 - - 0 -  Trouble concentrating 0 - - 0 -  Moving slowly or fidgety/restless 0 - - 0 -  Suicidal thoughts 0 - - 0 -  PHQ-9 Score 0 - - 0 -  Difficult doing work/chores - - - Not difficult at all -  Some recent data might be hidden    phq 9 is negative   Fall Risk: Fall Risk  08/08/2021 04/25/2021 04/06/2021 12/06/2020 08/04/2020  Falls in the past year? 0 0 0 0 0  Number falls in past yr: 0 0 - 0 0  Injury with Fall? 0 0 - 0 0  Risk for fall due to : No Fall Risks No Fall Risks - - -  Risk for fall due to: Comment - - - - -  Follow up Falls prevention discussed Falls prevention discussed Falls prevention discussed - -      Functional Status Survey: Is the patient deaf or have difficulty hearing?: Yes Does the patient have difficulty seeing, even when wearing glasses/contacts?: No Does the patient have difficulty concentrating, remembering, or making decisions?: No Does the patient have difficulty walking or climbing stairs?: Yes Does the patient have difficulty dressing or bathing?: No Does the patient have difficulty doing errands alone such as visiting a doctor's office or shopping?: No    Assessment & Plan  1. Diabetes mellitus type 2 in obese (HCC)  - POCT HgB A1C  2. Senile purpura ([place])  Reassurance given , both arms   3. Dyslipidemia associated with type 2 diabetes mellitus (HCC)   4. Claudication of both lower extremities ([place])  Improved with daily walks  5. Benign meningioma ([place])  Doing well , going to see neurosurgeon this Summer  6. Type 2 diabetes mellitus with microalbuminuria, without long-term current use of insulin (HCC)   7. Obstructive sleep apnea on CPAP   8. Gastroesophageal reflux disease without esophagitis  Stable.   9. Need for shingles vaccine  - Zoster Vaccine Adjuvanted [facility])  injection; Inject 0.5 mLs into the muscle once for 1 dose.  Dispense: 0.5 mL; Refill: 1

## 2021-08-08 ENCOUNTER — Ambulatory Visit (INDEPENDENT_AMBULATORY_CARE_PROVIDER_SITE_OTHER): Payer: Medicare Other | Admitting: Family Medicine

## 2021-08-08 ENCOUNTER — Encounter: Payer: Self-pay | Admitting: Family Medicine

## 2021-08-08 VITALS — BP 136/64 | HR 65 | Temp 97.9°F | Resp 16 | Ht 66.0 in | Wt 195.0 lb

## 2021-08-08 DIAGNOSIS — Z23 Encounter for immunization: Secondary | ICD-10-CM

## 2021-08-08 DIAGNOSIS — D692 Other nonthrombocytopenic purpura: Secondary | ICD-10-CM | POA: Diagnosis not present

## 2021-08-08 DIAGNOSIS — K219 Gastro-esophageal reflux disease without esophagitis: Secondary | ICD-10-CM

## 2021-08-08 DIAGNOSIS — G4733 Obstructive sleep apnea (adult) (pediatric): Secondary | ICD-10-CM | POA: Diagnosis not present

## 2021-08-08 DIAGNOSIS — E1169 Type 2 diabetes mellitus with other specified complication: Secondary | ICD-10-CM

## 2021-08-08 DIAGNOSIS — R809 Proteinuria, unspecified: Secondary | ICD-10-CM | POA: Diagnosis not present

## 2021-08-08 DIAGNOSIS — E119 Type 2 diabetes mellitus without complications: Secondary | ICD-10-CM

## 2021-08-08 DIAGNOSIS — E669 Obesity, unspecified: Secondary | ICD-10-CM | POA: Diagnosis not present

## 2021-08-08 DIAGNOSIS — Z9989 Dependence on other enabling machines and devices: Secondary | ICD-10-CM | POA: Diagnosis not present

## 2021-08-08 DIAGNOSIS — E1129 Type 2 diabetes mellitus with other diabetic kidney complication: Secondary | ICD-10-CM

## 2021-08-08 DIAGNOSIS — I739 Peripheral vascular disease, unspecified: Secondary | ICD-10-CM

## 2021-08-08 DIAGNOSIS — E785 Hyperlipidemia, unspecified: Secondary | ICD-10-CM | POA: Diagnosis not present

## 2021-08-08 DIAGNOSIS — D329 Benign neoplasm of meninges, unspecified: Secondary | ICD-10-CM | POA: Diagnosis not present

## 2021-08-08 LAB — POCT GLYCOSYLATED HEMOGLOBIN (HGB A1C): Hemoglobin A1C: 5.8 % — AB (ref 4.0–5.6)

## 2021-08-08 MED ORDER — SHINGRIX 50 MCG/0.5ML IM SUSR: 0.5000 mL | Freq: Once | INTRAMUSCULAR | 1 refills | Status: AC

## 2021-08-09 DIAGNOSIS — L578 Other skin changes due to chronic exposure to nonionizing radiation: Secondary | ICD-10-CM | POA: Diagnosis not present

## 2021-08-09 DIAGNOSIS — L814 Other melanin hyperpigmentation: Secondary | ICD-10-CM | POA: Diagnosis not present

## 2021-08-09 DIAGNOSIS — L988 Other specified disorders of the skin and subcutaneous tissue: Secondary | ICD-10-CM | POA: Diagnosis not present

## 2021-08-09 DIAGNOSIS — C44219 Basal cell carcinoma of skin of left ear and external auricular canal: Secondary | ICD-10-CM | POA: Diagnosis not present

## 2021-08-10 ENCOUNTER — Encounter: Payer: Self-pay | Admitting: Family Medicine

## 2021-09-06 DIAGNOSIS — D2261 Melanocytic nevi of right upper limb, including shoulder: Secondary | ICD-10-CM | POA: Diagnosis not present

## 2021-09-06 DIAGNOSIS — L821 Other seborrheic keratosis: Secondary | ICD-10-CM | POA: Diagnosis not present

## 2021-09-06 DIAGNOSIS — X32XXXA Exposure to sunlight, initial encounter: Secondary | ICD-10-CM | POA: Diagnosis not present

## 2021-09-06 DIAGNOSIS — D2262 Melanocytic nevi of left upper limb, including shoulder: Secondary | ICD-10-CM | POA: Diagnosis not present

## 2021-09-06 DIAGNOSIS — Z85828 Personal history of other malignant neoplasm of skin: Secondary | ICD-10-CM | POA: Diagnosis not present

## 2021-09-06 DIAGNOSIS — D225 Melanocytic nevi of trunk: Secondary | ICD-10-CM | POA: Diagnosis not present

## 2021-09-06 DIAGNOSIS — L570 Actinic keratosis: Secondary | ICD-10-CM | POA: Diagnosis not present

## 2021-09-11 DIAGNOSIS — Z23 Encounter for immunization: Secondary | ICD-10-CM | POA: Diagnosis not present

## 2021-09-27 ENCOUNTER — Other Ambulatory Visit: Payer: Self-pay | Admitting: Family Medicine

## 2021-09-27 DIAGNOSIS — I10 Essential (primary) hypertension: Secondary | ICD-10-CM

## 2021-10-03 DIAGNOSIS — E119 Type 2 diabetes mellitus without complications: Secondary | ICD-10-CM | POA: Diagnosis not present

## 2021-10-03 DIAGNOSIS — M79674 Pain in right toe(s): Secondary | ICD-10-CM | POA: Diagnosis not present

## 2021-10-03 DIAGNOSIS — M898X9 Other specified disorders of bone, unspecified site: Secondary | ICD-10-CM | POA: Diagnosis not present

## 2021-10-03 DIAGNOSIS — M205X2 Other deformities of toe(s) (acquired), left foot: Secondary | ICD-10-CM | POA: Diagnosis not present

## 2021-10-03 DIAGNOSIS — M79675 Pain in left toe(s): Secondary | ICD-10-CM | POA: Diagnosis not present

## 2021-10-03 DIAGNOSIS — L600 Ingrowing nail: Secondary | ICD-10-CM | POA: Diagnosis not present

## 2021-10-03 DIAGNOSIS — B351 Tinea unguium: Secondary | ICD-10-CM | POA: Diagnosis not present

## 2021-10-09 ENCOUNTER — Other Ambulatory Visit: Payer: Self-pay | Admitting: Family Medicine

## 2021-10-09 DIAGNOSIS — E1159 Type 2 diabetes mellitus with other circulatory complications: Secondary | ICD-10-CM

## 2021-10-09 DIAGNOSIS — E1169 Type 2 diabetes mellitus with other specified complication: Secondary | ICD-10-CM

## 2021-10-09 DIAGNOSIS — E1129 Type 2 diabetes mellitus with other diabetic kidney complication: Secondary | ICD-10-CM

## 2021-10-11 ENCOUNTER — Other Ambulatory Visit: Payer: Self-pay | Admitting: Family Medicine

## 2021-10-11 DIAGNOSIS — I10 Essential (primary) hypertension: Secondary | ICD-10-CM

## 2021-10-18 ENCOUNTER — Other Ambulatory Visit: Payer: Self-pay | Admitting: Family Medicine

## 2021-10-18 ENCOUNTER — Telehealth: Payer: Self-pay | Admitting: Family Medicine

## 2021-10-18 DIAGNOSIS — I10 Essential (primary) hypertension: Secondary | ICD-10-CM

## 2021-10-18 DIAGNOSIS — E1159 Type 2 diabetes mellitus with other circulatory complications: Secondary | ICD-10-CM

## 2021-10-18 NOTE — Telephone Encounter (Signed)
Pt called in to request a refill for potassium chloride SA (KLOR-CON) 20 MEQ tablet. Pt was told by his pharmacy to contact his PCP. Pt says that the need a new Rx.  ? ? ?Pharmacy:  ?[facility], [place] Phone:  [phone]  ?Fax:  [phone]  ?  ? ? ?Future visit: 11/16/21 ?

## 2021-10-18 NOTE — Telephone Encounter (Signed)
[facility] called and spoke to [place], [place] about the refill(s) Klor Con requested. Advised it was sent on 10/11/21 #90/0 refill(s). She says it was received and delivered on 10/14/21 in the patient's mailbox by the [place], tracking [phone]. Patient called, left VM to return the call to the office. Will need to be notified of the above if he returns the call. ? ?

## 2021-11-09 ENCOUNTER — Other Ambulatory Visit: Payer: Self-pay | Admitting: Family Medicine

## 2021-11-09 DIAGNOSIS — E1169 Type 2 diabetes mellitus with other specified complication: Secondary | ICD-10-CM

## 2021-11-09 DIAGNOSIS — E1129 Type 2 diabetes mellitus with other diabetic kidney complication: Secondary | ICD-10-CM

## 2021-11-10 ENCOUNTER — Telehealth: Payer: Self-pay | Admitting: Family Medicine

## 2021-11-10 DIAGNOSIS — I10 Essential (primary) hypertension: Secondary | ICD-10-CM

## 2021-11-10 DIAGNOSIS — I152 Hypertension secondary to endocrine disorders: Secondary | ICD-10-CM

## 2021-11-10 MED ORDER — METOPROLOL SUCCINATE ER 50 MG PO TB24: 50.0000 mg | ORAL_TABLET | Freq: Every evening | ORAL | 1 refills | Status: DC

## 2021-11-10 NOTE — Telephone Encounter (Signed)
Requested Prescriptions  ?Pending Prescriptions Disp Refills  ?? metoprolol succinate (TOPROL-XL) 50 MG 24 hr tablet 90 tablet 1  ?  Sig: Take 1 tablet (50 mg total) by mouth every evening.  ?  ? Cardiovascular:  Beta Blockers Passed - 11/10/2021 10:37 AM  ?  ?  Passed - Last BP in normal range  ?  BP Readings from Last 1 Encounters:  ?08/08/21 136/64  ?   ?  ?  Passed - Last Heart Rate in normal range  ?  Pulse Readings from Last 1 Encounters:  ?08/08/21 65  ?   ?  ?  Passed - Valid encounter within last 6 months  ?  Recent Outpatient Visits   ?      ? 3 months ago Diabetes mellitus type 2 in obese [facility])  ? [facility], Drue Stager, MD  ? 7 months ago Diabetes mellitus type 2 in obese [facility])  ? [facility] [place], Drue Stager, MD  ? 11 months ago Diabetes mellitus type 2 in obese [facility])  ? [facility] [place], Drue Stager, MD  ? 1 year ago Diabetes mellitus type 2 in obese [facility])  ? [facility], Drue Stager, MD  ? 1 year ago Diabetes mellitus type 2 in obese [facility])  ? [facility] Steele Sizer, MD  ?  ?  ?Future Appointments   ?        ? In 3 weeks Steele Sizer, MD [facility] [place]  ? In 5 months  [place]  ?  ? ?  ?  ?  ? ?

## 2021-11-10 NOTE — Telephone Encounter (Signed)
Medication Refill - Medication: metoprolol succinate (TOPROL-XL) 50 MG 24 hr tablet [[id number]]  ? ?Has the patient contacted their pharmacy? Yes.   ? ?Preferred Pharmacy (with phone number or street name): [facility], [place]  ?[address] [place] [address]  ?Phone:  [phone]  Fax:  [phone]  ? ? ?Has the patient been seen for an appointment in the last year OR does the patient have an upcoming appointment? Yes.   ? ?Agent: Please be advised that RX refills may take up to 3 business days. We ask that you follow-up with your pharmacy. ? ?

## 2021-11-13 MED ORDER — METOPROLOL SUCCINATE ER 50 MG PO TB24: 50.0000 mg | ORAL_TABLET | Freq: Every evening | ORAL | 0 refills | Status: DC

## 2021-11-13 NOTE — Telephone Encounter (Signed)
Patient called and advised Metoprolol was sent to [facility] and not [facility]. He says he is out and will go to [facility] to pick up a refill. Advised I will send to [facility] for the next refill. I asked if he wants his preferred pharmacy list updated to only include [facility], he says yes remove [facility] and [facility], both removed. Rx sent to [facility]. ?

## 2021-11-13 NOTE — Telephone Encounter (Signed)
Pt is calling to check on the status of medication refill. Pt ran out of medication yesterday. ?

## 2021-11-13 NOTE — Telephone Encounter (Signed)
Pt called, left VM that rx was sent to pharmacy on 11/10/21.  ? ?metoprolol succinate (TOPROL-XL) 50 MG 24 hr tablet 90 tablet 1 11/10/2021    ?Sig - Route: Take 1 tablet (50 mg total) by mouth every evening. - Oral   ?Sent to pharmacy as: metoprolol succinate (TOPROL-XL) 50 MG 24 hr tablet   ?E-Prescribing Status: Receipt confirmed by pharmacy (11/10/2021  2:34 PM EDT)   ? ?

## 2021-11-13 NOTE — Addendum Note (Signed)
Addended by: Matilde Sprang on: 11/13/2021 05:36 PM ? ? Modules accepted: Orders ? ?

## 2021-11-23 ENCOUNTER — Other Ambulatory Visit: Payer: Self-pay | Admitting: Family Medicine

## 2021-11-23 DIAGNOSIS — E1159 Type 2 diabetes mellitus with other circulatory complications: Secondary | ICD-10-CM

## 2021-11-23 DIAGNOSIS — E1169 Type 2 diabetes mellitus with other specified complication: Secondary | ICD-10-CM

## 2021-11-23 DIAGNOSIS — I10 Essential (primary) hypertension: Secondary | ICD-10-CM

## 2021-11-29 ENCOUNTER — Other Ambulatory Visit: Payer: Self-pay | Admitting: Cardiovascular Disease

## 2021-11-29 NOTE — Telephone Encounter (Signed)
Please schedule 12 month F/U appointment for 90 day refills. Thank you! 

## 2021-11-29 NOTE — Telephone Encounter (Signed)
Scheduled

## 2021-12-05 NOTE — Progress Notes (Unsigned)
Name: Jason Farrell.   MRN: [id number]    DOB: Oct 03, 1940   Date:12/06/2021       Progress Note  Subjective  Chief Complaint  Follow Up  HPI  Meningioma: he had a partial resection done at [facility] by Dr. Lacinda Axon in 2020, he states he has been doing well, since last visit with Dr. Lacinda Axon he states his memory problems have resolved, no longer having confusion, he denies headaches, no blurred or double vision. He is going back to see neurosurgeon - Dr. Lacinda Axon no longer at [facility]  MRI brain 07/22 1. 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 2. Stable appearance of small left frontal convexity meningioma. 3. 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 He states his wife has been using sugar substitute in the kitchen, they bought a new house and he has been more active doing work around the house, still maintains their yard.  He denies polyphagia or polydipsia.A1C went from 6.5 % to 7.3 % when we stopped Januvia , and last visit it was 6.8 % today it is down to 5.8 %  past two times .  He has associated dyslipidemia, HTN and obesity . He is on ARB, statin therapy and denies side effects of medications. He denies any hypoglycemic episodes, but advised to try taking half dose of glipizide   Claudication: he states he has aching on both legs when he walks for about 20 minutes , thigh and lower leg, he states since walking the dog daily pain has decreased, not interested in seeing vascular surgeon .Continue statin therapy    Obesity:  weight is stable since last visit, wife is still carrying for her mother but now her mother in law is living with them and wife has been able to cook again    HTN:  He had tachycardia and  placed on amiodarone that was recently filled by Dr. Rockey Situ, he is on Toprol XL 50 mg daily, Norvasc 5 mg and Valsartan hctz 320/25 , bp at home has been around 130's/80's, he denies dizziness , chest pain or palpitation BP is at goal     COPD with history of tobacco use: he denies daily cough, mild SOB with moderate activity, no wheezing, Quit smoking in 1996, after MI. He is not interested in taking inhalers    S/p MI and and history of stent placement/CAD with stable angina : sees Dr. Rockey Situ  last visit July 2022, no recent episodes of chest pain or palpitation, he has SOB with moderate activity and has to stop to rest . He is still taking  statin and last LDL was down to 32 , beta blockers , ARB and aspirin .   OSA: wearing CPAP , he is compliant with therapy  no longer having chest tightness when waking up    Senile purpura:he has more bruises on his arms, and sometimes legs when working in his yard    SVT: doing well on amiodarone and beta blocker , he sees cardiologist yearly - Dr Rockey Situ.  He denies any palpitation lately , his last TSH was done 01/2021 and within normal limits . We will recheck it yearly   OA knee: explained importance of increasing walking, he was advised  to have a knee replacement a few years ago but he decided to hold off, he states symptoms worse on left side, but no recent effusion or redness.  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 Unchanged   Hearing loss: he recently got an otc hearing aid and is doing well . Stable   History of iron deficiency anemia: diagnosed 2021, hemoccult stools negative times 3, he has been taking iron supplementation, denies pica, last level was normal . He is now only taking iron a few times a week.   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    Status post craniotomy 08/12/2018   Senile purpura ([place]) 02/21/2018   Type 2 diabetes mellitus with microalbuminuria, without long-term current use of insulin ([place]) 02/21/2018   Osteoarthritis of hip 11/04/2017   Obstructive sleep apnea on CPAP 12/09/2015   OA (osteoarthritis) of knee 05/06/2015   Gastro-esophageal reflux disease without esophagitis 05/06/2015   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 ([place]) 01/25/2015   Hyperlipidemia 08/16/2014   Essential hypertension 08/16/2014   Stopped smoking with greater than 30 pack year history 08/16/2014   COLD (chronic obstructive lung disease) ([place]) 08/16/2014    Past Surgical History:  Procedure Laterality Date   CARDIAC CATHETERIZATION  2006   stent placement   COLONOSCOPY     CORONARY ANGIOPLASTY WITH STENT PLACEMENT  2006   stent x 2    CORONARY ANGIOPLASTY WITH STENT PLACEMENT  1996   stent x 3    CRANIOTOMY  08/12/2018   FOOT SURGERY Right    HALLUX VALGUS CHEILECTOMY Right 02/25/2015   Procedure: HALLUX VALGUS CHEILECTOMY;  Surgeon: [name], DPM;  Location: [facility] ORS;  Service: Podiatry;  Laterality: Right;    Family History  Problem Relation Age of Onset   Hyperlipidemia Father    Hypertension Father    Alcohol abuse Father    Cancer Sister        lung   Hypertension Sister    Heart disease Sister    Hyperlipidemia Sister    COPD Sister    Cancer Sister        unknown    Social History   Tobacco Use   Smoking status: Former    Packs/day: 1.50    Years: 35.00    Pack years: 52.50    Types: Cigarettes    Start date: 07/17/1959    Quit date: 08/27/1994    Years since quitting: 27.2   Smokeless tobacco: Never   Tobacco comments:    smoking cessation materials not required  Substance Use Topics    Alcohol use: Not Currently    Alcohol/week: 0.0 standard drinks     Current Outpatient Medications:    acetaminophen (TYLENOL) 325 MG tablet, Take 1-2 tablets (325-650 mg total) by mouth every 4 (four) hours as needed for mild pain., Disp: , Rfl:    amiodarone (PACERONE) 200 MG tablet, TAKE 1 TABLET DAILY, Disp: 90 tablet, Rfl: 0   aspirin 81 MG tablet, Take 81 mg by mouth daily., Disp: , Rfl:    atorvastatin (LIPITOR) 40 MG tablet, TAKE  1 TABLET DAILY, Disp: 90 tablet, Rfl: 3   Cholecalciferol (VITAMIN D) 50 MCG (2000 UT) tablet, Take 2,000 Units by mouth daily., Disp: , Rfl:    docusate sodium (COLACE) 100 MG capsule, Take 100 mg by mouth 2 (two) times daily. Pt takes every other day, Disp: , Rfl:    ferrous sulfate 325 (65 FE) MG tablet, Take 1 tablet (325 mg total) by mouth 2 (two) times daily with a meal. Add colace 100 mg, Disp: 180 tablet, Rfl: 1   glucose blood test strip, Use as instructed, Disp: 100 each, Rfl: 12   KLOR-CON M20 20 MEQ tablet, TAKE 1 TABLET DAILY, Disp: 90 tablet, Rfl: 0   magnesium oxide (MAG-OX) 400 (241.3 Mg) MG tablet, Take 1 tablet (400 mg total) by mouth 2 (two) times daily., Disp: 60 tablet, Rfl: 0   Multiple Vitamins-Minerals (CENTRUM SILVER PO), Take 1 tablet by mouth daily. , Disp: , Rfl:    omeprazole (PRILOSEC) 40 MG capsule, TAKE 1 CAPSULE DAILY AS NEEDED, Disp: 90 capsule, Rfl: 2   amLODipine (NORVASC) 5 MG tablet, Take 1 tablet (5 mg total) by mouth every evening., Disp: 90 tablet, Rfl: 1   glipiZIDE (GLUCOTROL XL) 2.5 MG 24 hr tablet, Take 1 tablet (2.5 mg total) by mouth daily with breakfast., Disp: 90 tablet, Rfl: 0   metFORMIN (GLUCOPHAGE-XR) 750 MG 24 hr tablet, TAKE 2 TABLETS DAILY WITH BREAKFAST, Disp: 180 tablet, Rfl: 1   metoprolol succinate (TOPROL-XL) 50 MG 24 hr tablet, Take 1 tablet (50 mg total) by mouth every evening., Disp: 90 tablet, Rfl: 1   valsartan-hydrochlorothiazide (DIOVAN-HCT) 320-25 MG tablet, Take 1 tablet by mouth daily.,  Disp: 90 tablet, Rfl: 1  No Known Allergies  I personally reviewed active problem list, medication list, allergies, family history, social history, health maintenance with the patient/caregiver today.   ROS  Constitutional: Negative for fever or weight change.  Respiratory: Negative for cough and shortness of breath.   Cardiovascular: Negative for chest pain or palpitations.  Gastrointestinal: Negative for abdominal pain, no bowel changes.  Musculoskeletal: Negative for gait problem or joint swelling.  Skin: Negative for rash.  Neurological: Negative for dizziness or headache.  No other specific complaints in a complete review of systems (except as listed in HPI above).   Objective  Vitals:   12/06/21 1047  BP: 132/66  Pulse: 61  Resp: 16  SpO2: 98%  Weight: 194 lb (88 kg)  Height: '5\' 6"'$  (1.676 m)    Body mass index is 31.31 kg/m.  Physical Exam  Constitutional: Patient appears well-developed and well-nourished. Obese  No distress.  HEENT: head atraumatic, normocephalic, pupils equal and reactive to light, neck supple Cardiovascular: Normal rate, regular rhythm and normal heart sounds.  No murmur heard. No BLE edema. Pulmonary/Chest: Effort normal and breath sounds normal. No respiratory distress. Abdominal: Soft.  There is no tenderness. Psychiatric: Patient has a normal mood and affect. behavior is normal. Judgment and thought content normal.   Diabetic Foot Exam: Diabetic Foot Exam - Simple   Simple Foot Form Visual Inspection See comments: Yes Sensation Testing Intact to touch and monofilament testing bilaterally: Yes Pulse Check See comments: Yes Comments Thick toenails. Decrease distal pulses       PHQ2/9:    12/06/2021   10:46 AM 08/08/2021    9:26 AM 04/25/2021   10:52 AM 04/06/2021    9:31 AM 12/06/2020   10:37 AM  Depression screen PHQ 2/9  Decreased Interest 0 0 0 0  0  Down, Depressed, Hopeless 0 0 0 0 0  PHQ - 2 Score 0 0 0 0 0  Altered  sleeping 0 0   0  Tired, decreased energy 0 0   0  Change in appetite 0 0   0  Feeling bad or failure about yourself  0 0   0  Trouble concentrating 0 0   0  Moving slowly or fidgety/restless 0 0   0  Suicidal thoughts 0 0   0  PHQ-9 Score 0 0   0  Difficult doing work/chores     Not difficult at all    phq 9 is negative   Fall Risk:    12/06/2021   10:46 AM 08/08/2021    9:25 AM 04/25/2021   10:54 AM 04/06/2021    9:31 AM 12/06/2020   10:37 AM  Fall Risk   Falls in the past year? 0 0 0 0 0  Number falls in past yr: 0 0 0  0  Injury with Fall? 0 0 0  0  Risk for fall due to : No Fall Risks No Fall Risks No Fall Risks    Follow up Falls prevention discussed Falls prevention discussed Falls prevention discussed Falls prevention discussed       Functional Status Survey: Is the patient deaf or have difficulty hearing?: Yes Does the patient have difficulty seeing, even when wearing glasses/contacts?: No Does the patient have difficulty concentrating, remembering, or making decisions?: No Does the patient have difficulty walking or climbing stairs?: Yes Does the patient have difficulty dressing or bathing?: No Does the patient have difficulty doing errands alone such as visiting a doctor's office or shopping?: No    Assessment & Plan  Problem List Items Addressed This Visit     Essential hypertension    Controlled        Relevant Medications   amLODipine (NORVASC) 5 MG tablet   metoprolol succinate (TOPROL-XL) 50 MG 24 hr tablet   valsartan-hydrochlorothiazide (DIOVAN-HCT) 320-25 MG tablet   COLD (chronic obstructive lung disease) ([place])    Refuses medication        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 ([place])    Keep follow up with cardiologist this Summer       Relevant Medications   amLODipine (NORVASC) 5 MG tablet   metoprolol succinate (TOPROL-XL) 50 MG 24 hr tablet   valsartan-hydrochlorothiazide (DIOVAN-HCT) 320-25 MG  tablet   OA (osteoarthritis) of knee    Referral placed for Ortho        Relevant Orders   Ambulatory referral to Orthopedic Surgery   Obstructive sleep apnea on CPAP    Compliant        Type 2 diabetes mellitus with microalbuminuria, without long-term current use of insulin (HCC) - Primary    Last urine micro normal, on ARB       Relevant Medications   metFORMIN (GLUCOPHAGE-XR) 750 MG 24 hr tablet   glipiZIDE (GLUCOTROL XL) 2.5 MG 24 hr tablet   valsartan-hydrochlorothiazide (DIOVAN-HCT) 320-25 MG tablet   Other Relevant Orders   POCT HgB A1C (Completed)   HM Diabetes Foot Exam (Completed)   Meningioma (HCC)    Keep follow up with neurosurgeon       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   SVT (supraventricular tachycardia) (HCC)    Controlled with pacerone and metoprolol        Relevant Medications   amLODipine (NORVASC) 5 MG tablet  metoprolol succinate (TOPROL-XL) 50 MG 24 hr tablet   valsartan-hydrochlorothiazide (DIOVAN-HCT) 320-25 MG tablet   Gastro-esophageal reflux disease without esophagitis   Senile purpura (HCC)   Relevant Medications   amLODipine (NORVASC) 5 MG tablet   metoprolol succinate (TOPROL-XL) 50 MG 24 hr tablet   valsartan-hydrochlorothiazide (DIOVAN-HCT) 320-25 MG tablet   Other Visit Diagnoses     Dyslipidemia associated with type 2 diabetes mellitus (HCC)       Relevant Medications   metFORMIN (GLUCOPHAGE-XR) 750 MG 24 hr tablet   glipiZIDE (GLUCOTROL XL) 2.5 MG 24 hr tablet   valsartan-hydrochlorothiazide (DIOVAN-HCT) 320-25 MG tablet   Hypertension associated with diabetes (HCC)       Relevant Medications   metFORMIN (GLUCOPHAGE-XR) 750 MG 24 hr tablet   glipiZIDE (GLUCOTROL XL) 2.5 MG 24 hr tablet   amLODipine (NORVASC) 5 MG tablet   metoprolol succinate (TOPROL-XL) 50 MG 24 hr tablet   valsartan-hydrochlorothiazide (DIOVAN-HCT) 320-25 MG tablet

## 2021-12-06 ENCOUNTER — Encounter: Payer: Self-pay | Admitting: Family Medicine

## 2021-12-06 ENCOUNTER — Ambulatory Visit (INDEPENDENT_AMBULATORY_CARE_PROVIDER_SITE_OTHER): Payer: Medicare Other | Admitting: Family Medicine

## 2021-12-06 VITALS — BP 132/66 | HR 61 | Resp 16 | Ht 66.0 in | Wt 194.0 lb

## 2021-12-06 DIAGNOSIS — Z8679 Personal history of other diseases of the circulatory system: Secondary | ICD-10-CM | POA: Diagnosis not present

## 2021-12-06 DIAGNOSIS — D329 Benign neoplasm of meninges, unspecified: Secondary | ICD-10-CM | POA: Diagnosis not present

## 2021-12-06 DIAGNOSIS — J410 Simple chronic bronchitis: Secondary | ICD-10-CM

## 2021-12-06 DIAGNOSIS — R809 Proteinuria, unspecified: Secondary | ICD-10-CM

## 2021-12-06 DIAGNOSIS — Z9989 Dependence on other enabling machines and devices: Secondary | ICD-10-CM

## 2021-12-06 DIAGNOSIS — E1129 Type 2 diabetes mellitus with other diabetic kidney complication: Secondary | ICD-10-CM | POA: Diagnosis not present

## 2021-12-06 DIAGNOSIS — I471 Supraventricular tachycardia: Secondary | ICD-10-CM | POA: Diagnosis not present

## 2021-12-06 DIAGNOSIS — G4733 Obstructive sleep apnea (adult) (pediatric): Secondary | ICD-10-CM

## 2021-12-06 DIAGNOSIS — I152 Hypertension secondary to endocrine disorders: Secondary | ICD-10-CM

## 2021-12-06 DIAGNOSIS — I25118 Atherosclerotic heart disease of native coronary artery with other forms of angina pectoris: Secondary | ICD-10-CM

## 2021-12-06 DIAGNOSIS — E1159 Type 2 diabetes mellitus with other circulatory complications: Secondary | ICD-10-CM

## 2021-12-06 DIAGNOSIS — I10 Essential (primary) hypertension: Secondary | ICD-10-CM | POA: Diagnosis not present

## 2021-12-06 DIAGNOSIS — E669 Obesity, unspecified: Secondary | ICD-10-CM

## 2021-12-06 DIAGNOSIS — E1169 Type 2 diabetes mellitus with other specified complication: Secondary | ICD-10-CM | POA: Diagnosis not present

## 2021-12-06 DIAGNOSIS — K219 Gastro-esophageal reflux disease without esophagitis: Secondary | ICD-10-CM | POA: Diagnosis not present

## 2021-12-06 DIAGNOSIS — M170 Bilateral primary osteoarthritis of knee: Secondary | ICD-10-CM

## 2021-12-06 DIAGNOSIS — D692 Other nonthrombocytopenic purpura: Secondary | ICD-10-CM | POA: Diagnosis not present

## 2021-12-06 DIAGNOSIS — E785 Hyperlipidemia, unspecified: Secondary | ICD-10-CM

## 2021-12-06 LAB — POCT GLYCOSYLATED HEMOGLOBIN (HGB A1C): Hemoglobin A1C: 5.8 % — AB (ref 4.0–5.6)

## 2021-12-06 MED ORDER — METFORMIN HCL ER 750 MG PO TB24: ORAL_TABLET | ORAL | 1 refills | Status: DC

## 2021-12-06 MED ORDER — METOPROLOL SUCCINATE ER 50 MG PO TB24: 50.0000 mg | ORAL_TABLET | Freq: Every evening | ORAL | 1 refills | Status: DC

## 2021-12-06 MED ORDER — GLIPIZIDE ER 2.5 MG PO TB24: 2.5000 mg | ORAL_TABLET | Freq: Every day | ORAL | 0 refills | Status: DC

## 2021-12-06 MED ORDER — AMLODIPINE BESYLATE 5 MG PO TABS: 5.0000 mg | ORAL_TABLET | Freq: Every evening | ORAL | 1 refills | Status: DC

## 2021-12-06 MED ORDER — VALSARTAN-HYDROCHLOROTHIAZIDE 320-25 MG PO TABS: 1.0000 | ORAL_TABLET | Freq: Every day | ORAL | 1 refills | Status: DC

## 2021-12-06 NOTE — Assessment & Plan Note (Signed)
Compliant 

## 2021-12-06 NOTE — Assessment & Plan Note (Signed)
Keep follow up with cardiologist this Summer

## 2021-12-06 NOTE — Assessment & Plan Note (Signed)
Controlled with pacerone and metoprolol

## 2021-12-06 NOTE — Assessment & Plan Note (Signed)
Last urine micro normal, on ARB

## 2021-12-06 NOTE — Assessment & Plan Note (Signed)
Refuses medication

## 2021-12-06 NOTE — Assessment & Plan Note (Signed)
Controlled.  

## 2021-12-06 NOTE — Assessment & Plan Note (Signed)
Referral placed for Ortho

## 2021-12-06 NOTE — Assessment & Plan Note (Signed)
Keep follow up with neurosurgeon

## 2021-12-22 ENCOUNTER — Other Ambulatory Visit: Payer: Self-pay | Admitting: Family Medicine

## 2021-12-22 DIAGNOSIS — I10 Essential (primary) hypertension: Secondary | ICD-10-CM

## 2021-12-28 DIAGNOSIS — M170 Bilateral primary osteoarthritis of knee: Secondary | ICD-10-CM | POA: Diagnosis not present

## 2022-01-25 NOTE — Progress Notes (Signed)
Date:  01/26/2022   ID:  Jason Farrell., DOB [birth year], MRN [id number]  Patient Location:  [address] [place] [address]   Provider location:   [facility], [place] office  PCP:  Steele Sizer, MD  Cardiologist:  Patsy Baltimore  Chief Complaint  Patient presents with   Other    12 month f/u no complaints today. Meds reviewed verbally with pt.    History of Present Illness:    Jason Farrell. is a 81 y.o. 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  Last seen in clinic July 2022  Lab work reviewed on today's visit A1C 5.8 Total chol 120,  LDL 32  In general reports doing well, denies any chest pain concerning for angina Active on the property Gardening, lots of hedging, mowing Activities are limited by chronic joint issues, knees, hips Helps take care of 3 small dogs  No arrhythmia/tachycardia concerning for atrial fibrillation or SVT  EKG personally reviewed by myself on todays visit Sinus bradycardia rate 52 bpm RBBB  Other past medical history reviewed In the hospital 09/2018 intermittent confusion with difficulty speaking and visual changes since 06/2018 work-up revealing large meningioma at level of cerebral aqueduct with vasogenic edema posterior medial temporal lobes and obstructive hydrocephalus.    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 Marylu Lund.   Postop course  mental status changes with bouts of lethargy, confusion, problems with sleep-wake disruption with delirium, loss of bilateral vision as well as issues with  leukocytosis and SVT. Amiodarone and B-blocker for SVT   Sister with lung cancer now in remission Reports he stopped smoking 1996  He stopped smoking in 1996 after his MI . Smoked 38 yrs   Past Medical History:  Diagnosis Date   Coronary artery disease    Diabetes mellitus without complication ([place])    Dyslipidemia    GERD (gastroesophageal reflux disease)    Hyperlipidemia    Hypertension    MI (myocardial infarction) ([place])    Osteoarthritis    Past Surgical History:  Procedure Laterality Date   CARDIAC CATHETERIZATION  2006   stent placement   COLONOSCOPY     CORONARY ANGIOPLASTY WITH STENT PLACEMENT  2006   stent x 2    CORONARY ANGIOPLASTY WITH STENT PLACEMENT  1996   stent x 3    CRANIOTOMY  08/12/2018   FOOT SURGERY Right    HALLUX VALGUS CHEILECTOMY Right 02/25/2015   Procedure: HALLUX VALGUS CHEILECTOMY;  Surgeon: [name], DPM;  Location: [facility] ORS;  Service: Podiatry;  Laterality: Right;     Current Meds  Medication Sig   acetaminophen (TYLENOL) 325 MG tablet Take 1-2 tablets (325-650 mg total) by mouth every 4 (four) hours as needed for mild pain.   amLODipine (NORVASC) 5 MG tablet Take 1 tablet (5 mg total) by mouth every evening.   aspirin 81 MG tablet Take 81 mg by mouth daily.   atorvastatin (LIPITOR) 40 MG tablet TAKE 1 TABLET DAILY   Cholecalciferol (VITAMIN D) 50 MCG (2000 UT) tablet Take 2,000 Units by mouth daily.  docusate sodium (COLACE) 100 MG capsule Take 100 mg by mouth 2 (two) times daily. Pt takes every other day   ferrous sulfate 325 (65 FE) MG tablet Take 1 tablet (325 mg total) by mouth 2 (two) times daily with a meal. Add colace 100 mg (Patient taking differently: Take 325 mg by mouth daily with breakfast.)   glipiZIDE (GLUCOTROL XL) 2.5 MG 24 hr tablet Take 1 tablet (2.5 mg total) by mouth daily with breakfast.   glucose blood test strip Use as instructed   KLOR-CON M20 20 MEQ tablet TAKE 1 TABLET DAILY   magnesium oxide (MAG-OX)  400 (241.3 Mg) MG tablet Take 1 tablet (400 mg total) by mouth 2 (two) times daily.   metFORMIN (GLUCOPHAGE-XR) 750 MG 24 hr tablet TAKE 2 TABLETS DAILY WITH BREAKFAST   Multiple Vitamins-Minerals (CENTRUM SILVER PO) Take 1 tablet by mouth daily.    omeprazole (PRILOSEC) 40 MG capsule TAKE 1 CAPSULE DAILY AS NEEDED   valsartan-hydrochlorothiazide (DIOVAN-HCT) 320-25 MG tablet Take 1 tablet by mouth daily.   [DISCONTINUED] amiodarone (PACERONE) 200 MG tablet TAKE 1 TABLET DAILY   [DISCONTINUED] metoprolol succinate (TOPROL-XL) 50 MG 24 hr tablet Take 1 tablet (50 mg total) by mouth every evening.     Allergies:   Patient has no known allergies.   Social History   Tobacco Use   Smoking status: Former    Packs/day: 1.50    Years: 35.00    Total pack years: 52.50    Types: Cigarettes    Start date: 07/17/1959    Quit date: 08/27/1994    Years since quitting: 27.4   Smokeless tobacco: Never   Tobacco comments:    smoking cessation materials not required  Vaping Use   Vaping Use: Never used  Substance Use Topics   Alcohol use: Not Currently    Alcohol/week: 0.0 standard drinks of alcohol   Drug use: No     Family Hx: The patient's family history includes Alcohol abuse in his father; COPD in his sister; Cancer in his sister and sister; Heart disease in his sister; Hyperlipidemia in his father and sister; Hypertension in his father and sister.  ROS:   Please see the history of present illness.    Review of Systems  Constitutional: Negative.   Respiratory: Negative.    Cardiovascular: Negative.   Gastrointestinal: Negative.   Musculoskeletal: Negative.   Neurological: Negative.   Psychiatric/Behavioral: Negative.    All other systems reviewed and are negative.     Labs/Other Tests and Data Reviewed:    Recent Labs: 04/06/2021: ALT 20; BUN 15; Creat 0.81; Hemoglobin 14.3; Platelets 232; Potassium 5.2; Sodium 140   Recent Lipid Panel Lab Results  Component Value Date/Time    CHOL 92 04/06/2021 10:19 AM   CHOL 126 11/07/2015 10:06 AM   TRIG 87 04/06/2021 10:19 AM   HDL 43 04/06/2021 10:19 AM   HDL 47 11/07/2015 10:06 AM   CHOLHDL 2.1 04/06/2021 10:19 AM   LDLCALC 32 04/06/2021 10:19 AM    Wt Readings from Last 3 Encounters:  01/26/22 192 lb 8 oz (87.3 kg)  12/06/21 194 lb (88 kg)  08/08/21 195 lb (88.5 kg)     Exam:    BP 120/70 (BP Location: Left Arm, Patient Position: Sitting, Cuff Size: Normal)   Pulse (!) 52   Ht '5\' 6"'$  (1.676 m)   Wt 192 lb 8 oz (87.3 kg)   SpO2 98%   BMI 31.07 kg/m  Constitutional:  oriented to person,  place, and time. No distress.  HENT:  Head: Grossly normal Eyes:  no discharge. No scleral icterus.  Neck: No JVD, no carotid bruits  Cardiovascular: Regular rate and rhythm, no murmurs appreciated Pulmonary/Chest: Clear to auscultation bilaterally, no wheezes or rails Abdominal: Soft.  no distension.  no tenderness.  Musculoskeletal: Normal range of motion Neurological:  normal muscle tone. Coordination normal. No atrophy Skin: Skin warm and dry Psychiatric: normal affect, pleasant  ASSESSMENT & PLAN:     Coronary artery disease involving native coronary artery of native heart without angina pectoris - Currently with no symptoms of angina. No further workup at this time. Continue current medication regimen. Cholesterol at goal, A1c low, non-smoker  Essential hypertension - Decrease metoprolol succinate down to 25 daily continue other medications   Mixed hyperlipidemia Cholesterol is at goal on the current lipid regimen. No changes to the medications were made.   Controlled type 2 diabetes mellitus without complication, without long-term current use of insulin (HCC) Hemoglobin A1c less than 6 Doing well   Class 2 obesity due to excess calories without serious comorbidity in adult, unspecified BMI We have encouraged continued exercise, careful diet management in an effort to lose weight.  Centrilobular emphysema  ([place]) 38 years of smoking, quit smoking Chronic mild shortness of breath on heavy exertion   Obstructive sleep apnea on CPAP Stable, on CPAP  SVT On amiodarone and beta-blocker,  We will check a TSH Decrease metoprolol as above for bradycardia down to 25 daily   Total encounter time more than 30 minutes  Greater than 50% was spent in counseling and coordination of care with the patient    Signed, Ida Rogue, MD  01/26/2022 12:32 PM    [place] Office [address], [place], [place] [address]

## 2022-01-26 ENCOUNTER — Ambulatory Visit (INDEPENDENT_AMBULATORY_CARE_PROVIDER_SITE_OTHER): Payer: Medicare Other | Admitting: Cardiovascular Disease

## 2022-01-26 ENCOUNTER — Encounter: Payer: Self-pay | Admitting: Cardiovascular Disease

## 2022-01-26 VITALS — BP 120/70 | HR 52 | Ht 66.0 in | Wt 192.5 lb

## 2022-01-26 DIAGNOSIS — E669 Obesity, unspecified: Secondary | ICD-10-CM | POA: Diagnosis not present

## 2022-01-26 DIAGNOSIS — Z9989 Dependence on other enabling machines and devices: Secondary | ICD-10-CM | POA: Diagnosis not present

## 2022-01-26 DIAGNOSIS — E1159 Type 2 diabetes mellitus with other circulatory complications: Secondary | ICD-10-CM

## 2022-01-26 DIAGNOSIS — E1169 Type 2 diabetes mellitus with other specified complication: Secondary | ICD-10-CM | POA: Diagnosis not present

## 2022-01-26 DIAGNOSIS — I152 Hypertension secondary to endocrine disorders: Secondary | ICD-10-CM

## 2022-01-26 DIAGNOSIS — G4733 Obstructive sleep apnea (adult) (pediatric): Secondary | ICD-10-CM

## 2022-01-26 DIAGNOSIS — J432 Centrilobular emphysema: Secondary | ICD-10-CM | POA: Diagnosis not present

## 2022-01-26 DIAGNOSIS — I25118 Atherosclerotic heart disease of native coronary artery with other forms of angina pectoris: Secondary | ICD-10-CM | POA: Diagnosis not present

## 2022-01-26 DIAGNOSIS — Z79899 Other long term (current) drug therapy: Secondary | ICD-10-CM | POA: Diagnosis not present

## 2022-01-26 DIAGNOSIS — E119 Type 2 diabetes mellitus without complications: Secondary | ICD-10-CM | POA: Diagnosis not present

## 2022-01-26 DIAGNOSIS — I10 Essential (primary) hypertension: Secondary | ICD-10-CM | POA: Diagnosis not present

## 2022-01-26 MED ORDER — METOPROLOL SUCCINATE ER 25 MG PO TB24: 25.0000 mg | ORAL_TABLET | Freq: Every evening | ORAL | 3 refills | Status: DC

## 2022-01-26 MED ORDER — AMIODARONE HCL 200 MG PO TABS: 200.0000 mg | ORAL_TABLET | Freq: Every day | ORAL | 3 refills | Status: DC

## 2022-01-26 NOTE — Patient Instructions (Addendum)
Medication Instructions:  Your physician has recommended you make the following change in your medication:   - DECREASE metoprolol down to 25 mg daily (from 50 mg daily)  If you need a refill on your cardiac medications before your next appointment, please call your pharmacy.   Lab work:  Your physician recommends that you return for lab work (TSH) in: the next week  - Please go to the [facility]. You will check in at the front desk to the right as you walk into the atrium. Valet Parking is offered if needed. - No appointment needed. You may go any day between 7 am and 6 pm.    Testing/Procedures: No new testing needed  Follow-Up: At [facility], you and your health needs are our priority.  As part of our continuing mission to provide you with exceptional heart care, we have created designated Provider Care Teams.  These Care Teams include your primary Cardiologist (physician) and Advanced Practice Providers (APPs -  Physician Assistants and Nurse Practitioners) who all work together to provide you with the care you need, when you need it.  You will need a follow up appointment in 12 months  Providers on your designated Care Team:   Murray Hodgkins, NP Christell Faith, PA-C Cadence Kathlen Mody, [place]  COVID-19 Vaccine Information can be found at: [email or web address] For questions related to vaccine distribution or appointments, please email [email or web address] or call [phone].

## 2022-01-30 ENCOUNTER — Other Ambulatory Visit
Admission: RE | Admit: 2022-01-30 | Discharge: 2022-01-30 | Disposition: A | Discharge status: Home / Self Care | Payer: Medicare Other | Attending: Cardiovascular Disease | Admitting: Cardiovascular Disease

## 2022-01-30 DIAGNOSIS — Z79899 Other long term (current) drug therapy: Secondary | ICD-10-CM | POA: Insufficient documentation

## 2022-01-30 LAB — TSH: TSH: 1.936 u[IU]/mL (ref 0.350–4.500)

## 2022-01-30 NOTE — Addendum Note (Signed)
Addended by: Britt Bottom on: 01/30/2022 09:47 AM   Modules accepted: Orders

## 2022-03-15 ENCOUNTER — Other Ambulatory Visit: Payer: Self-pay | Admitting: Family Medicine

## 2022-03-15 DIAGNOSIS — E1159 Type 2 diabetes mellitus with other circulatory complications: Secondary | ICD-10-CM

## 2022-03-15 DIAGNOSIS — E1129 Type 2 diabetes mellitus with other diabetic kidney complication: Secondary | ICD-10-CM

## 2022-04-03 DIAGNOSIS — Z23 Encounter for immunization: Secondary | ICD-10-CM | POA: Diagnosis not present

## 2022-04-05 NOTE — Progress Notes (Signed)
Name: Jason Farrell.   MRN: [id number]    DOB: 1940/09/23   Date:04/06/2022       Progress Note  Subjective  Chief Complaint  Follow Up  HPI  Meningioma: he had a partial resection done at [facility] by Dr. Lacinda Axon in 2020, he states he has been doing well, since last visit with Dr. Lacinda Axon he states his memory problems have resolved, no longer having confusion, he denies headaches, no blurred or double vision. He is going back to see neurosurgeon at [facility] clinic, Dr. Lacinda Axon no longer there but will follow up with local provider   MRI brain 07/22 1. 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 2. Stable appearance of small left frontal convexity meningioma. 3. 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     DMII: he was diagnosed many years ago, he is  taking Metformin and Glipizide, glucose at home has been better controlled, usually 100-130,  He denies polyphagia or polydipsia.A1C went from 6.5 % to 7.3 % when we stopped Januvia , and last visit it was 6.8 % today it is down to 5.8 %  x 2 and today 6.1 % He has associated dyslipidemia, HTN and obesity . He is on ARB, statin therapy and denies side effects of medications. He denies any hypoglycemic episodes.  Claudication: he states he has aching on both legs when he walks for about 20 minutes , thigh and lower leg, he states since walking the dog daily pain has decreased, not interested in seeing vascular surgeon .Continue statin therapy, he also takes asprin   Obesity:  weight is stable since last visit, wife is still carrying for her mother but now her mother in law is living with them and wife has been able to cook again    HTN:  He had tachycardia and placed on amiodarone that was recently filled by Dr. Rockey Situ, he is on Toprol XL 25 mg daily( dosed decreased in July due to bradycardia) , Norvasc 5 mg and Valsartan hctz  320/25 , bp at home has been been below 10 at home and also here today, we will try going down on Hctz to 12.5 mg and stop taking potassium supplementation    COPD with history of tobacco use: he denies daily cough, mild SOB with moderate activity, no wheezing, Quit smoking in 1996, after MI. He is not interested in taking inhalers    S/p MI and and history of stent placement/CAD with stable angina : sees Dr. Rockey Situ  last visit July 2022, no recent episodes of chest pain or palpitation, he has SOB with moderate activity and has to stop to rest . He is still taking  statin and we will recheck labs today    OSA: wearing CPAP , he is compliant with therapy  no longer having chest tightness when waking up   Rhinitis: going on for over one year, when he eats and also with activity we will try Atrovent    Senile purpura:he has more bruises on his arms, unchanged   SVT: doing well on amiodarone and beta blocker , he sees cardiologist yearly - Dr Rockey Situ.  He denies any palpitation lately , his last TSH was done 01/2022 and within normal limits .   OA knee: explained importance of increasing walking, he was advised to have a knee replacement a few years ago but he decided to hold off, he  states symptoms worse on left side, but no recent effusion or redness. 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 Unchanged   Hearing loss: he recently got an otc hearing aid and is doing well . Stable   History of iron deficiency anemia: diagnosed 2021, hemoccult stools negative times 3, he has been taking iron supplementation, denies pica, last level was normal . 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    Status post craniotomy 08/12/2018   Senile purpura ([place]) 02/21/2018   Type 2 diabetes mellitus with microalbuminuria, without long-term current use of insulin ([place]) 02/21/2018   Osteoarthritis of hip 11/04/2017   Obstructive sleep apnea on CPAP 12/09/2015   OA (osteoarthritis) of knee 05/06/2015   Gastro-esophageal reflux disease without esophagitis 05/06/2015   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 ([place]) 01/25/2015   Hyperlipidemia 08/16/2014   Essential hypertension 08/16/2014   Stopped smoking with greater than 30 pack year history 08/16/2014   COLD (chronic obstructive lung disease) ([place]) 08/16/2014    Past Surgical History:  Procedure Laterality Date   CARDIAC CATHETERIZATION  2006   stent placement   COLONOSCOPY     CORONARY ANGIOPLASTY WITH STENT PLACEMENT  2006   stent x 2    CORONARY ANGIOPLASTY WITH STENT PLACEMENT  1996   stent x 3    CRANIOTOMY  08/12/2018   FOOT SURGERY Right    HALLUX VALGUS CHEILECTOMY Right 02/25/2015   Procedure: HALLUX VALGUS CHEILECTOMY;  Surgeon: [name], DPM;  Location: [facility] ORS;  Service: Podiatry;  Laterality: Right;    Family History  Problem Relation Age of Onset   Hyperlipidemia Father    Hypertension Father    Alcohol abuse Father    Cancer Sister        lung   Hypertension Sister    Heart disease Sister    Hyperlipidemia Sister    COPD Sister    Cancer Sister        unknown    Social History   Tobacco Use   Smoking status: Former    Packs/day: 1.50    Years: 35.00    Total pack years: 52.50    Types: Cigarettes    Start date: 07/17/1959    Quit date: 08/27/1994    Years since quitting: 27.6   Smokeless tobacco: Never   Tobacco comments:    smoking cessation materials not required  Substance Use Topics   Alcohol  use: Not Currently    Alcohol/week: 0.0 standard drinks of alcohol     Current Outpatient Medications:    acetaminophen (TYLENOL) 325 MG tablet, Take 1-2 tablets (325-650 mg total) by mouth every 4 (four) hours as needed for mild pain., Disp: , Rfl:    amiodarone (PACERONE) 200 MG tablet, Take 1 tablet (200 mg total) by mouth daily., Disp: 90 tablet, Rfl: 3   amLODipine (NORVASC) 5 MG tablet, Take 1 tablet (5 mg total) by mouth every evening., Disp: 90 tablet, Rfl: 1   aspirin 81  MG tablet, Take 81 mg by mouth daily., Disp: , Rfl:    atorvastatin (LIPITOR) 40 MG tablet, TAKE 1 TABLET DAILY, Disp: 90 tablet, Rfl: 3   Cholecalciferol (VITAMIN D) 50 MCG (2000 UT) tablet, Take 2,000 Units by mouth daily., Disp: , Rfl:    docusate sodium (COLACE) 100 MG capsule, Take 100 mg by mouth 2 (two) times daily. Pt takes every other day, Disp: , Rfl:    ferrous sulfate 325 (65 FE) MG tablet, Take 1 tablet (325 mg total) by mouth 2 (two) times daily with a meal. Add colace 100 mg (Patient taking differently: Take 325 mg by mouth daily with breakfast.), Disp: 180 tablet, Rfl: 1   glipiZIDE (GLUCOTROL XL) 2.5 MG 24 hr tablet, TAKE 1 TABLET DAILY WITH BREAKFAST, Disp: 90 tablet, Rfl: 0   glucose blood test strip, Use as instructed, Disp: 100 each, Rfl: 12   KLOR-CON M20 20 MEQ tablet, TAKE 1 TABLET DAILY, Disp: 90 tablet, Rfl: 3   magnesium oxide (MAG-OX) 400 (241.3 Mg) MG tablet, Take 1 tablet (400 mg total) by mouth 2 (two) times daily., Disp: 60 tablet, Rfl: 0   metFORMIN (GLUCOPHAGE-XR) 750 MG 24 hr tablet, TAKE 2 TABLETS DAILY WITH BREAKFAST, Disp: 180 tablet, Rfl: 1   metoprolol succinate (TOPROL-XL) 25 MG 24 hr tablet, Take 1 tablet (25 mg total) by mouth every evening., Disp: 90 tablet, Rfl: 3   Multiple Vitamins-Minerals (CENTRUM SILVER PO), Take 1 tablet by mouth daily. , Disp: , Rfl:    omeprazole (PRILOSEC) 40 MG capsule, TAKE 1 CAPSULE DAILY AS NEEDED, Disp: 90 capsule, Rfl: 2    valsartan-hydrochlorothiazide (DIOVAN-HCT) 320-25 MG tablet, Take 1 tablet by mouth daily., Disp: 90 tablet, Rfl: 1  No Known Allergies  I personally reviewed active problem list, medication list, allergies, family history, social history, health maintenance with the patient/caregiver today.   ROS  Constitutional: Negative for fever or weight change.  Respiratory: Negative for cough and shortness of breath.   Cardiovascular: Negative for chest pain or palpitations.  Gastrointestinal: Negative for abdominal pain, no bowel changes.  Musculoskeletal: Negative for gait problem or joint swelling.  Skin: Negative for rash.  Neurological: Negative for dizziness or headache.  No other specific complaints in a complete review of systems (except as listed in HPI above).   Objective  Vitals:   04/06/22 1055  BP: 116/68  Pulse: (!) 58  Resp: 16  SpO2: 97%  Weight: 191 lb (86.6 kg)  Height: '5\' 6"'$  (1.676 m)    Body mass index is 30.83 kg/m.  Physical Exam  Constitutional: Patient appears well-developed and well-nourished. Obese  No distress.  HEENT: head atraumatic, normocephalic, pupils equal and reactive to light, neck supple Cardiovascular: Normal rate, regular rhythm and normal heart sounds.  No murmur heard. No BLE edema. Pulmonary/Chest: Effort normal and breath sounds normal. No respiratory distress. Abdominal: Soft.  There is no tenderness. Psychiatric: Patient has a normal mood and affect. behavior is normal. Judgment and thought content normal.   Recent Results (from the past 2160 hour(s))  TSH     Status: None   Collection Time: 01/30/22 11:15 AM  Result Value Ref Range   TSH 1.936 0.350 - 4.500 uIU/mL    Comment: Performed by a 3rd Generation assay with a functional sensitivity of <=0.01 uIU/mL. Performed at [facility], [address] [place], [place] [address]   POCT HgB A1C     Status: Abnormal   Collection Time: 04/06/22 10:58 AM  Result Value Ref  Range   Hemoglobin A1C 6.1 (A) 4.0 - 5.6 %   HbA1c POC (<> result, manual entry)     HbA1c, POC (prediabetic range)     HbA1c, POC (controlled diabetic range)      PHQ2/9:    04/06/2022   10:56 AM 12/06/2021   10:46 AM 08/08/2021    9:26 AM 04/25/2021   10:52 AM 04/06/2021    9:31 AM  Depression screen PHQ 2/9  Decreased Interest 0 0 0 0 0  Down, Depressed, Hopeless 0 0 0 0 0  PHQ - 2 Score 0 0 0 0 0  Altered sleeping 0 0 0    Tired, decreased energy 0 0 0    Change in appetite 0 0 0    Feeling bad or failure about yourself  0 0 0    Trouble concentrating 0 0 0    Moving slowly or fidgety/restless 0 0 0    Suicidal thoughts 0 0 0    PHQ-9 Score 0 0 0      phq 9 is negative   Fall Risk:    04/06/2022   10:56 AM 12/06/2021   10:46 AM 08/08/2021    9:25 AM 04/25/2021   10:54 AM 04/06/2021    9:31 AM  [place] in the past year? 0 0 0 0 0  Number falls in past yr: 0 0 0 0   Injury with Fall? 0 0 0 0   Risk for fall due to : No Fall Risks No Fall Risks No Fall Risks No Fall Risks   Follow up Falls prevention discussed Falls prevention discussed Falls prevention discussed Falls prevention discussed Falls prevention discussed      Functional Status Survey: Is the patient deaf or have difficulty hearing?: Yes Does the patient have difficulty seeing, even when wearing glasses/contacts?: No Does the patient have difficulty concentrating, remembering, or making decisions?: No Does the patient have difficulty walking or climbing stairs?: Yes Does the patient have difficulty dressing or bathing?: No Does the patient have difficulty doing errands alone such as visiting a doctor's office or shopping?: No    Assessment & Plan  1. Type 2 diabetes mellitus with microalbuminuria, without long-term current use of insulin (HCC)  - POCT HgB A1C - Urine Microalbumin w/creat. ratio - COMPLETE METABOLIC PANEL WITH GFR - metFORMIN (GLUCOPHAGE-XR) 750 MG 24 hr tablet; TAKE 2  TABLETS DAILY WITH BREAKFAST  Dispense: 180 tablet; Refill: 1  2. Essential hypertension  - CBC with Differential/Platelet - amLODipine (NORVASC) 5 MG tablet; Take 1 tablet (5 mg total) by mouth every evening.  Dispense: 90 tablet; Refill: 1 - valsartan-hydrochlorothiazide (DIOVAN-HCT) 320-12.5 MG tablet; Take 1 tablet by mouth daily.  Dispense: 90 tablet; Refill: 1 - Magnesium  3. Hypertension associated with diabetes ([place])  - amLODipine (NORVASC) 5 MG tablet; Take 1 tablet (5 mg total) by mouth every evening.  Dispense: 90 tablet; Refill: 1  4. Dyslipidemia associated with type 2 diabetes mellitus (HCC)  - Lipid panel - metFORMIN (GLUCOPHAGE-XR) 750 MG 24 hr tablet; TAKE 2 TABLETS DAILY WITH BREAKFAST  Dispense: 180 tablet; Refill: 1  5. Gastroesophageal reflux disease without esophagitis   6. Vasomotor rhinitis  - ipratropium (ATROVENT) 0.03 % nasal spray; Place 2 sprays into both nostrils every 12 (twelve) hours.  Dispense: 90 mL; Refill: 1  7. 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 Meningioma ([place])  Needs to follow up with neurosurgeon  9. Senile purpura ([place])  Reassurance given   10. SVT (supraventricular tachycardia) (HCC)  No longer a problem, taking beta blocker  11. History of anemia  - CBC with Differential/Platelet

## 2022-04-06 ENCOUNTER — Ambulatory Visit (INDEPENDENT_AMBULATORY_CARE_PROVIDER_SITE_OTHER): Payer: Medicare Other | Admitting: Family Medicine

## 2022-04-06 ENCOUNTER — Encounter: Payer: Self-pay | Admitting: Family Medicine

## 2022-04-06 VITALS — BP 116/68 | HR 58 | Resp 16 | Ht 66.0 in | Wt 191.0 lb

## 2022-04-06 DIAGNOSIS — Z862 Personal history of diseases of the blood and blood-forming organs and certain disorders involving the immune mechanism: Secondary | ICD-10-CM

## 2022-04-06 DIAGNOSIS — I471 Supraventricular tachycardia: Secondary | ICD-10-CM

## 2022-04-06 DIAGNOSIS — R809 Proteinuria, unspecified: Secondary | ICD-10-CM | POA: Diagnosis not present

## 2022-04-06 DIAGNOSIS — D692 Other nonthrombocytopenic purpura: Secondary | ICD-10-CM

## 2022-04-06 DIAGNOSIS — D329 Benign neoplasm of meninges, unspecified: Secondary | ICD-10-CM | POA: Diagnosis not present

## 2022-04-06 DIAGNOSIS — I10 Essential (primary) hypertension: Secondary | ICD-10-CM

## 2022-04-06 DIAGNOSIS — E785 Hyperlipidemia, unspecified: Secondary | ICD-10-CM

## 2022-04-06 DIAGNOSIS — I25118 Atherosclerotic heart disease of native coronary artery with other forms of angina pectoris: Secondary | ICD-10-CM

## 2022-04-06 DIAGNOSIS — J300 Vasomotor rhinitis: Secondary | ICD-10-CM | POA: Diagnosis not present

## 2022-04-06 DIAGNOSIS — E1129 Type 2 diabetes mellitus with other diabetic kidney complication: Secondary | ICD-10-CM

## 2022-04-06 DIAGNOSIS — E1159 Type 2 diabetes mellitus with other circulatory complications: Secondary | ICD-10-CM | POA: Diagnosis not present

## 2022-04-06 DIAGNOSIS — Z8679 Personal history of other diseases of the circulatory system: Secondary | ICD-10-CM | POA: Diagnosis not present

## 2022-04-06 DIAGNOSIS — E1169 Type 2 diabetes mellitus with other specified complication: Secondary | ICD-10-CM | POA: Diagnosis not present

## 2022-04-06 DIAGNOSIS — K219 Gastro-esophageal reflux disease without esophagitis: Secondary | ICD-10-CM | POA: Diagnosis not present

## 2022-04-06 DIAGNOSIS — I152 Hypertension secondary to endocrine disorders: Secondary | ICD-10-CM

## 2022-04-06 LAB — POCT GLYCOSYLATED HEMOGLOBIN (HGB A1C): Hemoglobin A1C: 6.1 % — AB (ref 4.0–5.6)

## 2022-04-06 MED ORDER — OMEPRAZOLE 40 MG PO CPDR: 40.0000 mg | DELAYED_RELEASE_CAPSULE | Freq: Every day | ORAL | 1 refills | Status: DC

## 2022-04-06 MED ORDER — AMLODIPINE BESYLATE 5 MG PO TABS: 5.0000 mg | ORAL_TABLET | Freq: Every evening | ORAL | 1 refills | Status: DC

## 2022-04-06 MED ORDER — METFORMIN HCL ER 750 MG PO TB24: ORAL_TABLET | ORAL | 1 refills | Status: DC

## 2022-04-06 MED ORDER — VALSARTAN-HYDROCHLOROTHIAZIDE 320-12.5 MG PO TABS: 1.0000 | ORAL_TABLET | Freq: Every day | ORAL | 1 refills | Status: DC

## 2022-04-06 MED ORDER — IPRATROPIUM BROMIDE 0.03 % NA SOLN: 2.0000 | Freq: Two times a day (BID) | NASAL | 1 refills | Status: AC

## 2022-04-07 LAB — COMPLETE METABOLIC PANEL WITH GFR
AG Ratio: 1.6 (calc) (ref 1.0–2.5)
ALT: 19 U/L (ref 9–46)
AST: 16 U/L (ref 10–35)
Albumin: 4.1 g/dL (ref 3.6–5.1)
Alkaline phosphatase (APISO): 91 U/L (ref 35–144)
BUN: 16 mg/dL (ref 7–25)
CO2: 27 mmol/L (ref 20–32)
Calcium: 9.4 mg/dL (ref 8.6–10.3)
Chloride: 103 mmol/L (ref 98–110)
Creat: 0.94 mg/dL (ref 0.70–1.28)
Globulin: 2.5 g/dL (calc) (ref 1.9–3.7)
Glucose, Bld: 112 mg/dL — ABNORMAL HIGH (ref 65–99)
Potassium: 5.1 mmol/L (ref 3.5–5.3)
Sodium: 141 mmol/L (ref 135–146)
Total Bilirubin: 0.5 mg/dL (ref 0.2–1.2)
Total Protein: 6.6 g/dL (ref 6.1–8.1)
eGFR: 82 mL/min/{1.73_m2} (ref >=60)

## 2022-04-07 LAB — CBC WITH DIFFERENTIAL/PLATELET
Absolute Monocytes: 785 cells/uL (ref 200–950)
Basophils Absolute: 72 cells/uL (ref 0–200)
Basophils Relative: 1 %
Eosinophils Absolute: 79 cells/uL (ref 15–500)
Eosinophils Relative: 1.1 %
HCT: 44.7 % (ref 38.5–50.0)
Hemoglobin: 15.5 g/dL (ref 13.2–17.1)
Lymphs Abs: 1166 cells/uL (ref 850–3900)
MCH: 31 pg (ref 27.0–33.0)
MCHC: 34.7 g/dL (ref 32.0–36.0)
MCV: 89.4 fL (ref 80.0–100.0)
MPV: 10.5 fL (ref 7.5–12.5)
Monocytes Relative: 10.9 %
Neutro Abs: 5098 cells/uL (ref 1500–7800)
Neutrophils Relative %: 70.8 %
Platelets: 275 10*3/uL (ref 140–400)
RBC: 5 10*6/uL (ref 4.20–5.80)
RDW: 13.1 % (ref 11.0–15.0)
Total Lymphocyte: 16.2 %
WBC: 7.2 10*3/uL (ref 3.8–10.8)

## 2022-04-07 LAB — MAGNESIUM: Magnesium: 2.1 mg/dL (ref 1.5–2.5)

## 2022-04-07 LAB — LIPID PANEL
Cholesterol: 110 mg/dL (ref <200)
HDL: 55 mg/dL (ref >=40)
LDL Cholesterol (Calc): 40 mg/dL (calc)
Non-HDL Cholesterol (Calc): 55 mg/dL (calc) (ref <130)
Total CHOL/HDL Ratio: 2 (calc) (ref <5.0)
Triglycerides: 71 mg/dL (ref <150)

## 2022-05-01 ENCOUNTER — Ambulatory Visit (INDEPENDENT_AMBULATORY_CARE_PROVIDER_SITE_OTHER): Payer: Medicare Other

## 2022-05-01 VITALS — BP 146/62 | HR 56 | Temp 97.8°F | Resp 17 | Ht 66.0 in | Wt 190.9 lb

## 2022-05-01 DIAGNOSIS — Z0000 Encounter for general adult medical examination without abnormal findings: Secondary | ICD-10-CM | POA: Diagnosis not present

## 2022-05-01 NOTE — Progress Notes (Signed)
Subjective:   Jason Farrell. is a 81 y.o. male who presents for Medicare Annual/Subsequent preventive examination.  Review of Systems    Per HPI unless specifically indicated below.  Cardiac Risk Factors include: advanced age (>22mn, >>29women);male gender, essential hypertension, and hyperlipidemia.        Objective:    Today's Vitals   05/01/22 1011 05/01/22 1021  BP: (!) 162/68   Pulse: (!) 56   Resp: 17   Temp: 97.8 F (36.6 C)   TempSrc: Oral   SpO2: 97%   Weight: 190 lb 14.4 oz (86.6 kg)   Height: '5\' 6"'$  (1.676 m)   PainSc: 0-No pain 0-No pain   Body mass index is 30.81 kg/m.     04/25/2021   10:53 AM 04/21/2020   10:44 AM 04/17/2019   10:25 AM 09/26/2018    3:04 PM 09/23/2018    9:17 AM 09/04/2018    6:20 PM 08/30/2018    4:57 PM  Advanced Directives  Does Patient Have a Medical Advance Directive? No No No No No No No  Would patient like information on creating a medical advance directive? No - Patient declined No - Patient declined No - Patient declined  No - Patient declined No - Patient declined No - Patient declined    Current Medications (verified) Outpatient Encounter Medications as of 05/01/2022  Medication Sig   acetaminophen (TYLENOL) 325 MG tablet Take 1-2 tablets (325-650 mg total) by mouth every 4 (four) hours as needed for mild pain.   amiodarone (PACERONE) 200 MG tablet Take 1 tablet (200 mg total) by mouth daily.   amLODipine (NORVASC) 5 MG tablet Take 1 tablet (5 mg total) by mouth every evening.   aspirin 81 MG tablet Take 81 mg by mouth daily.   atorvastatin (LIPITOR) 40 MG tablet TAKE 1 TABLET DAILY   Cholecalciferol (VITAMIN D) 50 MCG (2000 UT) tablet Take 2,000 Units by mouth daily.   docusate sodium (COLACE) 100 MG capsule Take 100 mg by mouth 2 (two) times daily. Pt takes every other day   ferrous sulfate 325 (65 FE) MG tablet Take 1 tablet (325 mg total) by mouth 2 (two) times daily with a meal. Add colace 100 mg (Patient taking  differently: Take 325 mg by mouth daily with breakfast.)   glipiZIDE (GLUCOTROL XL) 2.5 MG 24 hr tablet TAKE 1 TABLET DAILY WITH BREAKFAST   glucose blood test strip Use as instructed   ipratropium (ATROVENT) 0.03 % nasal spray Place 2 sprays into both nostrils every 12 (twelve) hours.   magnesium oxide (MAG-OX) 400 (241.3 Mg) MG tablet Take 1 tablet (400 mg total) by mouth 2 (two) times daily.   metFORMIN (GLUCOPHAGE-XR) 750 MG 24 hr tablet TAKE 2 TABLETS DAILY WITH BREAKFAST   metoprolol succinate (TOPROL-XL) 25 MG 24 hr tablet Take 1 tablet (25 mg total) by mouth every evening.   Multiple Vitamins-Minerals (CENTRUM SILVER PO) Take 1 tablet by mouth daily.    valsartan-hydrochlorothiazide (DIOVAN-HCT) 320-12.5 MG tablet Take 1 tablet by mouth daily.   No facility-administered encounter medications on file as of 05/01/2022.    Allergies (verified) Patient has no known allergies.   History: Past Medical History:  Diagnosis Date   Coronary artery disease    Diabetes mellitus without complication (HCC)    Dyslipidemia    GERD (gastroesophageal reflux disease)    Hyperlipidemia    Hypertension    MI (myocardial infarction) ([facility]    Osteoarthritis    Past Surgical  History:  Procedure Laterality Date   CARDIAC CATHETERIZATION  2006   stent placement   COLONOSCOPY     CORONARY ANGIOPLASTY WITH STENT PLACEMENT  2006   stent x 2    CORONARY ANGIOPLASTY WITH STENT PLACEMENT  1996   stent x 3    CRANIOTOMY  08/12/2018   FOOT SURGERY Right    HALLUX VALGUS CHEILECTOMY Right 02/25/2015   Procedure: HALLUX VALGUS CHEILECTOMY;  Surgeon: [name], DPM;  Location: [facility] ORS;  Service: Podiatry;  Laterality: Right;   Family History  Problem Relation Age of Onset   Hyperlipidemia Father    Hypertension Father    Alcohol abuse Father    Cancer Sister        lung   Hypertension Sister    Heart disease Sister    Hyperlipidemia Sister    COPD Sister    Cancer Sister         unknown   Social History   Socioeconomic History   Marital status: Married    Spouse name: Manuela Schwartz   Number of children: 4   Years of education: some college   Highest education level: 12th grade  Occupational History   Occupation: Retired  Tobacco Use   Smoking status: Former    Packs/day: 1.50    Years: 35.00    Total pack years: 52.50    Types: Cigarettes    Start date: 07/17/1959    Quit date: 08/27/1994    Years since quitting: 27.6   Smokeless tobacco: Never   Tobacco comments:    smoking cessation materials not required  Vaping Use   Vaping Use: Never used  Substance and Sexual Activity   Alcohol use: Not Currently    Alcohol/week: 0.0 standard drinks of alcohol   Drug use: No   Sexual activity: Not Currently    Partners: Female  Other Topics Concern   Not on file  Social History Narrative   Not on file   Social Determinants of Health   Financial Resource Strain: Low Risk  (05/01/2022)   Overall Financial Resource Strain (CARDIA)    Difficulty of Paying Living Expenses: Not hard at all  Food Insecurity: No Food Insecurity (05/01/2022)   Hunger Vital Sign    Worried About Running Out of Food in the Last Year: Never true    Ran Out of Food in the Last Year: Never true  Transportation Needs: No Transportation Needs (05/01/2022)   PRAPARE - Hydrologist (Medical): No    Lack of Transportation (Non-Medical): No  Physical Activity: Sufficiently Active (05/01/2022)   Exercise Vital Sign    Days of Exercise per Week: 4 days    Minutes of Exercise per Session: 60 min  Stress: No Stress Concern Present (05/01/2022)   [place]    Feeling of Stress : Not at all  Social Connections: Socially Isolated (05/01/2022)   Social Connection and Isolation Panel [NHANES]    Frequency of Communication with Friends and Family: Once a week    Frequency of Social Gatherings with Friends  and Family: Once a week    Attends Religious Services: Never    Marine scientist or Organizations: No    Attends Music therapist: Never    Marital Status: Married    Tobacco Counseling Counseling given: Not Answered Tobacco comments: smoking cessation materials not required   Clinical Intake:  Pre-visit preparation completed: No  Pain : No/denies  pain Pain Score: 0-No pain     Nutritional Status: BMI > 30  Obese Nutritional Risks: Unintentional weight loss (pt reports he lose about 20lbs in the last 6-8 months) Diabetes: Yes CBG done?: Yes CBG resulted in Enter/ Edit results?: Yes (124 bs) Did pt. bring in CBG monitor from home?: Yes Glucose Meter Downloaded?: No  How often do you need to have someone help you when you read instructions, pamphlets, or other written materials from your doctor or pharmacy?: 1 - Never  Diabetic? No Interpreter Needed?: No  Information entered by :: Donnie Mesa, CMA   Activities of Daily Living    05/01/2022   10:17 AM 04/06/2022   10:57 AM  In your present state of health, do you have any difficulty performing the following activities:  Hearing? 1 1  Comment the pt had his hearing checked in the past and was told that he need hearing aids. He declined a referral at this time.   Vision? 1 0  Comment [facility]   Difficulty concentrating or making decisions? 0 0  Walking or climbing stairs? 1 1  Dressing or bathing? 0 0  Doing errands, shopping? 0 0    Patient Care Team: Steele Sizer, MD as PCP - General (Family Medicine) Minna Merritts, MD as Consulting Physician (Cardiology) Deetta Perla, MD as Consulting Physician (Neurosurgery)  Indicate any recent Medical Services you may have received from other than [facility] providers in the past year (date may be approximate). No hospitalization in the past 12 months.     Assessment:   This is a routine wellness examination for Messiyah.  Hearing/Vision  screen Difficulty with hearing. The pt denies a referral to Audiology. He has some OTC hearing aids he use occasionally.  Denies any changes with his vision. Due for an Annual Eye Exam, [facility]  Dietary issues and exercise activities discussed: Current Exercise Habits: Home exercise routine, Type of exercise: Other - see comments (yardwork), Time (Minutes): 60, Frequency (Times/Week): 4, Weekly Exercise (Minutes/Week): 240, Intensity: Mild   Goals Addressed   None    Depression Screen    05/01/2022   10:15 AM 04/06/2022   10:56 AM 12/06/2021   10:46 AM 08/08/2021    9:26 AM 04/25/2021   10:52 AM 04/06/2021    9:31 AM 12/06/2020   10:37 AM  PHQ 2/9 Scores  PHQ - 2 Score 0 0 0 0 0 0 0  PHQ- 9 Score 1 0 0 0   0    Fall Risk    05/01/2022   10:17 AM 04/06/2022   10:56 AM 12/06/2021   10:46 AM 08/08/2021    9:25 AM 04/25/2021   10:54 AM  Fall Risk   Falls in the past year? 0 0 0 0 0  Number falls in past yr: 0 0 0 0 0  Injury with Fall? 0 0 0 0 0  Risk for fall due to : No Fall Risks No Fall Risks No Fall Risks No Fall Risks No Fall Risks  Follow up Falls evaluation completed Falls prevention discussed Falls prevention discussed Falls prevention discussed Falls prevention discussed    FALL RISK PREVENTION PERTAINING TO THE HOME:  Any stairs in or around the home?  No stairs in the home  If so, are there any without handrails? No  Home free of loose throw rugs in walkways, pet beds, electrical cords, etc? Yes  Adequate lighting in your home to reduce risk of falls? Yes   ASSISTIVE DEVICES UTILIZED  TO PREVENT FALLS:  Life alert? No  Use of a cane, walker or w/c? No  Grab bars in the bathroom? No  Shower chair or bench in shower? Yes  Elevated toilet seat or a handicapped toilet? Yes   TIMED UP AND GO:  Was the test performed? Yes .  Length of time to ambulate 10 feet: 10 sec.   Gait slow and steady without use of assistive device  Cognitive Function:         05/01/2022   10:19 AM 04/21/2020   10:32 AM 04/17/2019   10:28 AM 01/23/2018   10:50 AM 12/03/2016   10:11 AM  6CIT Screen  What Year? 0 points 0 points 0 points 0 points 0 points  What month? 0 points 0 points 0 points 0 points 0 points  What time? 0 points 0 points 0 points 0 points 0 points  Count back from 20 0 points 0 points 0 points 0 points 0 points  Months in reverse 0 points 0 points 0 points 2 points 2 points  Repeat phrase 4 points 0 points 0 points 2 points 4 points  Total Score 4 points 0 points 0 points 4 points 6 points    Immunizations Immunization History  Administered Date(s) Administered   Fluad Quad(high Dose 65+) 04/02/2019   Influenza, High Dose Seasonal PF 03/19/2018, 03/31/2021, 04/03/2022   Influenza, Seasonal, Injecte, Preservative Fre 05/10/2014   Influenza-Unspecified 04/15/2015, 03/21/2016, 04/02/2020   Moderna Covid-19 Vaccine Bivalent Booster 32yr & up 09/11/2021   PFIZER(Purple Top)SARS-COV-2 Vaccination 09/05/2019, 09/29/2019, 05/10/2020, 03/14/2021   Pfizer Covid-19 Vaccine Bivalent Booster 151yr& up 04/03/2022   Pneumococcal Conjugate-13 07/10/2016   Pneumococcal Polysaccharide-23 07/22/2009   Tdap 12/06/2020    TDAP status: Up to date  Flu Vaccine status: Up to date  Pneumococcal vaccine status: Up to date  Covid-19 vaccine status: Information provided on how to obtain vaccines.   Qualifies for Shingles Vaccine? Yes   Zostavax completed No   Shingrix Completed?: No.    Education has been provided regarding the importance of this vaccine. Patient has been advised to call insurance company to determine out of pocket expense if they have not yet received this vaccine. Advised may also receive vaccine at local pharmacy or Health Dept. Verbalized acceptance and understanding.  Screening Tests Health Maintenance  Topic Date Due   Zoster Vaccines- Shingrix (1 of 2) Never done   OPHTHALMOLOGY EXAM  03/07/2022   Diabetic kidney evaluation -  Urine ACR  04/06/2022   COVID-19 Vaccine (7 - Pfizer risk series) 05/29/2022   HEMOGLOBIN A1C  10/05/2022   FOOT EXAM  12/07/2022   Diabetic kidney evaluation - GFR measurement  04/07/2023   TETANUS/TDAP  12/07/2030   Pneumonia Vaccine 81Years old  Completed   INFLUENZA VACCINE  Completed   Hepatitis C Screening  Completed   HPV VACCINES  Aged Out   COLONOSCOPY (Pts [id number] coverage will need to be confirmed)  Discontinued    Health Maintenance  Health Maintenance Due  Topic Date Due   Zoster Vaccines- Shingrix (1 of 2) Never done   OPHTHALMOLOGY EXAM  03/07/2022   Diabetic kidney evaluation - Urine ACR  04/06/2022    Colorectal cancer screening: No longer required.   Lung Cancer Screening: (Low Dose CT Chest recommended if Age 89-17-80ars, 30 pack-year currently smoking OR have quit w/in 15years.) does not qualify.     Additional Screening:  Hepatitis C Screening: does qualify; Completed 04/04/2020  Vision Screening:  Recommended annual ophthalmology exams for early detection of glaucoma and other disorders of the eye. Is the patient up to date with their annual eye exam?  Yes  Who is the provider or what is the name of the office in which the patient attends annual eye exams? [facility]  If pt is not established with a provider, would they like to be referred to a provider to establish care? No .   Dental Screening: Recommended annual dental exams for proper oral hygiene  Community Resource Referral / Chronic Care Management: CRR required this visit?  Yes   CCM required this visit?  No      Plan:     I have personally reviewed and noted the following in the patient's chart:   Medical and social history Use of alcohol, tobacco or illicit drugs  Current medications and supplements including opioid prescriptions. Patient is not currently taking opioid prescriptions. Functional ability and status Nutritional status Physical activity Advanced  directives List of other physicians Hospitalizations, surgeries, and ER visits in previous 12 months Vitals Screenings to include cognitive, depression, and falls Referrals and appointments  In addition, I have reviewed and discussed with patient certain preventive protocols, quality metrics, and best practice recommendations. A written personalized care plan for preventive services as well as general preventive health recommendations were provided to patient.    Mr. Lucks , Thank you for taking time to come for your Medicare Wellness Visit. I appreciate your ongoing commitment to your health goals. Please review the following plan we discussed and let me know if I can assist you in the future.   These are the goals we discussed:  Goals      DIET - INCREASE WATER INTAKE     Recommend to drink at least 6-8 8oz glasses of water per day.        This is a list of the screening recommended for you and due dates:  Health Maintenance  Topic Date Due   Zoster (Shingles) Vaccine (1 of 2) Never done   Eye exam for diabetics  03/07/2022   Yearly kidney health urinalysis for diabetes  04/06/2022   COVID-19 Vaccine (7 - Pfizer risk series) 05/29/2022   Hemoglobin A1C  10/05/2022   Complete foot exam   12/07/2022   Yearly kidney function blood test for diabetes  04/07/2023   Tetanus Vaccine  12/07/2030   Pneumonia Vaccine  Completed   Flu Shot  Completed   Hepatitis C Screening: USPSTF Recommendation to screen - Ages 18-79 yo.  Completed   HPV Vaccine  Aged Out   Colon Cancer Screening  Discontinued     Wilson Singer, [facility]   05/01/2022   Nurse Notes: Approximately 30 minute Face-To-Face Visit

## 2022-05-01 NOTE — Patient Instructions (Signed)
Health Maintenance, Male Adopting a healthy lifestyle and getting preventive care are important in promoting health and wellness. Ask your health care provider about: The right schedule for you to have regular tests and exams. Things you can do on your own to prevent diseases and keep yourself healthy. What should I know about diet, weight, and exercise? Eat a healthy diet  Eat a diet that includes plenty of vegetables, fruits, low-fat dairy products, and lean protein. Do not eat a lot of foods that are high in solid fats, added sugars, or sodium. Maintain a healthy weight Body mass index (BMI) is a measurement that can be used to identify possible weight problems. It estimates body fat based on height and weight. Your health care provider can help determine your BMI and help you achieve or maintain a healthy weight. Get regular exercise Get regular exercise. This is one of the most important things you can do for your health. Most adults should: Exercise for at least 150 minutes each week. The exercise should increase your heart rate and make you sweat (moderate-intensity exercise). Do strengthening exercises at least twice a week. This is in addition to the moderate-intensity exercise. Spend less time sitting. Even light physical activity can be beneficial. Watch cholesterol and blood lipids Have your blood tested for lipids and cholesterol at 79 years of age, then have this test every 5 years. You may need to have your cholesterol levels checked more often if: Your lipid or cholesterol levels are high. You are older than 79 years of age. You are at high risk for heart disease. What should I know about cancer screening? Many types of cancers can be detected early and may often be prevented. Depending on your health history and family history, you may need to have cancer screening at various ages. This may include screening for: Colorectal cancer. Prostate cancer. Skin cancer. Lung  cancer. What should I know about heart disease, diabetes, and high blood pressure? Blood pressure and heart disease High blood pressure causes heart disease and increases the risk of stroke. This is more likely to develop in people who have high blood pressure readings or are overweight. Talk with your health care provider about your target blood pressure readings. Have your blood pressure checked: Every 3-5 years if you are 18-39 years of age. Every year if you are 40 years old or older. If you are between the ages of 65 and 75 and are a current or former smoker, ask your health care provider if you should have a one-time screening for abdominal aortic aneurysm (AAA). Diabetes Have regular diabetes screenings. This checks your fasting blood sugar level. Have the screening done: Once every three years after age 45 if you are at a normal weight and have a low risk for diabetes. More often and at a younger age if you are overweight or have a high risk for diabetes. What should I know about preventing infection? Hepatitis B If you have a higher risk for hepatitis B, you should be screened for this virus. Talk with your health care provider to find out if you are at risk for hepatitis B infection. Hepatitis C Blood testing is recommended for: Everyone born from 1945 through 1965. Anyone with known risk factors for hepatitis C. Sexually transmitted infections (STIs) You should be screened each year for STIs, including gonorrhea and chlamydia, if: You are sexually active and are younger than 79 years of age. You are older than 79 years of age and your   health care provider tells you that you are at risk for this type of infection. Your sexual activity has changed since you were last screened, and you are at increased risk for chlamydia or gonorrhea. Ask your health care provider if you are at risk. Ask your health care provider about whether you are at high risk for HIV. Your health care provider  may recommend a prescription medicine to help prevent HIV infection. If you choose to take medicine to prevent HIV, you should first get tested for HIV. You should then be tested every 3 months for as long as you are taking the medicine. Follow these instructions at home: Alcohol use Do not drink alcohol if your health care provider tells you not to drink. If you drink alcohol: Limit how much you have to 0-2 drinks a day. Know how much alcohol is in your drink. In the [place] one drink equals one 12 oz bottle of beer (355 mL), one 5 oz glass of wine (148 mL), or one 1 oz glass of hard liquor (44 mL). Lifestyle Do not use any products that contain nicotine or tobacco. These products include cigarettes, chewing tobacco, and vaping devices, such as e-cigarettes. If you need help quitting, ask your health care provider. Do not use street drugs. Do not share needles. Ask your health care provider for help if you need support or information about quitting drugs. General instructions Schedule regular health, dental, and eye exams. Stay current with your vaccines. Tell your health care provider if: You often feel depressed. You have ever been abused or do not feel safe at home. Summary Adopting a healthy lifestyle and getting preventive care are important in promoting health and wellness. Follow your health care provider's instructions about healthy diet, exercising, and getting tested or screened for diseases. Follow your health care provider's instructions on monitoring your cholesterol and blood pressure. This information is not intended to replace advice given to you by your health care provider. Make sure you discuss any questions you have with your health care provider. Document Revised: 11/21/2020 Document Reviewed: 11/21/2020 Elsevier Patient Education  2023 [employer]  

## 2022-06-11 DIAGNOSIS — D2262 Melanocytic nevi of left upper limb, including shoulder: Secondary | ICD-10-CM | POA: Diagnosis not present

## 2022-06-11 DIAGNOSIS — D0471 Carcinoma in situ of skin of right lower limb, including hip: Secondary | ICD-10-CM | POA: Diagnosis not present

## 2022-06-11 DIAGNOSIS — X32XXXA Exposure to sunlight, initial encounter: Secondary | ICD-10-CM | POA: Diagnosis not present

## 2022-06-11 DIAGNOSIS — L570 Actinic keratosis: Secondary | ICD-10-CM | POA: Diagnosis not present

## 2022-06-11 DIAGNOSIS — C44311 Basal cell carcinoma of skin of nose: Secondary | ICD-10-CM | POA: Diagnosis not present

## 2022-06-11 DIAGNOSIS — D225 Melanocytic nevi of trunk: Secondary | ICD-10-CM | POA: Diagnosis not present

## 2022-06-11 DIAGNOSIS — D2272 Melanocytic nevi of left lower limb, including hip: Secondary | ICD-10-CM | POA: Diagnosis not present

## 2022-06-11 DIAGNOSIS — D485 Neoplasm of uncertain behavior of skin: Secondary | ICD-10-CM | POA: Diagnosis not present

## 2022-06-11 DIAGNOSIS — Z85828 Personal history of other malignant neoplasm of skin: Secondary | ICD-10-CM | POA: Diagnosis not present

## 2022-06-14 ENCOUNTER — Other Ambulatory Visit: Payer: Self-pay | Admitting: Family Medicine

## 2022-06-14 DIAGNOSIS — E1129 Type 2 diabetes mellitus with other diabetic kidney complication: Secondary | ICD-10-CM

## 2022-06-14 DIAGNOSIS — I152 Hypertension secondary to endocrine disorders: Secondary | ICD-10-CM

## 2022-06-26 DIAGNOSIS — D0461 Carcinoma in situ of skin of right upper limb, including shoulder: Secondary | ICD-10-CM | POA: Diagnosis not present

## 2022-07-19 DIAGNOSIS — L578 Other skin changes due to chronic exposure to nonionizing radiation: Secondary | ICD-10-CM | POA: Diagnosis not present

## 2022-07-19 DIAGNOSIS — C44311 Basal cell carcinoma of skin of nose: Secondary | ICD-10-CM | POA: Diagnosis not present

## 2022-07-19 DIAGNOSIS — L988 Other specified disorders of the skin and subcutaneous tissue: Secondary | ICD-10-CM | POA: Diagnosis not present

## 2022-07-19 DIAGNOSIS — L814 Other melanin hyperpigmentation: Secondary | ICD-10-CM | POA: Diagnosis not present

## 2022-08-06 NOTE — Progress Notes (Signed)
Name: Jason Farrell.   MRN: [id number]    DOB: [birth year]   Date:08/07/2022       Progress Note  Subjective  Chief Complaint  Follow Up  HPI  Meningioma: he had a partial resection done at [facility] by Dr. Lacinda Axon in 2020, he states he has been doing well, since last visit with Dr. Lacinda Axon he states his memory problems have resolved, no longer having confusion, states no balanced problems, he denies headaches, no blurred or double vision. He has a visit coming up to see neurosurgeon and have repeat scans  MRI brain 07/22 1. 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 2. Stable appearance of small left frontal convexity meningioma. 3. 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   DMII: he was diagnosed many years ago, he is  taking Metformin and Glipizide, glucose at home has been better controlled, usually 113-118  He denies polyphagia or polydipsia.A1C went from 6.5 % to 7.3 % when we stopped Januvia , and last visit it was 6.8 % today it is down to 5.8 %  x 2 last visit it was  6.1 % and today is up to 6.8 %, he states likely secondary to holidays  He has associated dyslipidemia, HTN and obesity . He is on ARB, statin therapy and denies side effects of medications.   Claudication: he states he has aching on both legs when he walks for about 20 minutes , thigh and lower leg, he was doing better while walking his dog daily but has not been as active lately, helping mother in law and also too cold to be outside. Discussed silver sneakers    Obesity:  weight is stable since last visit, wife is still carrying for her mother but now her mother in law is living with them and wife has been able to cook again    HTN:  He had tachycardia and placed on amiodarone that was recently filled by Dr. Rockey Situ, he is on Toprol XL 25 mg daily( dosed decreased in July due to bradycardia) ,  Norvasc 5 mg and Valsartan hctz 320/25 , bp at home was low and we adjusted dose of vaslartan hctz to 320/12.5 mg and he is doing well, bp is at goal today , he states not checking bp at home lately   Chronic bronchitis  with history of tobacco use: he denies daily cough, mild SOB with moderate activity, states not as active lately so not sure if any changes, no wheezing, Quit smoking in 1996, after MI. He is not interested in taking inhalers    S/p MI and and history of stent placement/CAD with stable angina : sees Dr. Rockey Situ  last visit July 2022, no recent episodes of chest pain or palpitation, he has SOB with moderate activity and has to stop to rest . 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  He denies any palpitation lately , his last TSH was done 01/2022 and within normal limits . Unchanged   OA knee: explained importance of increasing walking, he was advised to have a knee replacement a few years ago but he decided to hold off, he states symptoms worse on left side, still has intermittent effusion but stable.   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 Unchanged   Hearing loss: he recently got an otc hearing aid and is doing well . Still had difficulty understanding me   History of iron deficiency anemia: diagnosed 2021, hemoccult stools negative times 3, he has been taking iron supplementation, denies pica, last level was normal .  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    Status post craniotomy 08/12/2018   Senile purpura ([place]) 02/21/2018   Type 2 diabetes mellitus with microalbuminuria, without long-term current use of insulin ([place]) 02/21/2018   Osteoarthritis of hip 11/04/2017   Obstructive sleep apnea on CPAP 12/09/2015   OA (osteoarthritis) of knee 05/06/2015   Gastroesophageal reflux disease without esophagitis 05/06/2015   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 ([place]) 01/25/2015   Hyperlipidemia 08/16/2014   Essential hypertension 08/16/2014   Stopped smoking with greater than 30 pack year history 08/16/2014   COLD (chronic obstructive lung disease) ([place]) 08/16/2014    Past Surgical History:  Procedure Laterality Date   CARDIAC CATHETERIZATION  2006   stent placement   COLONOSCOPY     CORONARY ANGIOPLASTY WITH STENT PLACEMENT  2006   stent x 2    CORONARY ANGIOPLASTY WITH STENT PLACEMENT  1996   stent x 3    CRANIOTOMY  08/12/2018   FOOT SURGERY Right    HALLUX VALGUS CHEILECTOMY Right 02/25/2015   Procedure: HALLUX VALGUS CHEILECTOMY;  Surgeon: [name], DPM;  Location: [facility] ORS;  Service: Podiatry;  Laterality: Right;    Family History  Problem Relation Age of Onset   Hyperlipidemia Father    Hypertension Father    Alcohol abuse Father    Cancer Sister        lung   Hypertension Sister    Heart disease Sister    Hyperlipidemia Sister    COPD Sister    Cancer Sister        unknown    Social History   Tobacco Use    Smoking status: Former    Packs/day: 1.50    Years: 35.00    Total pack years: 52.50    Types: Cigarettes    Start date: 07/17/1959    Quit date: 08/27/1994    Years since quitting: 27.9   Smokeless tobacco: Never   Tobacco comments:    smoking cessation materials not required  Substance Use Topics   Alcohol use: Not Currently    Alcohol/week: 0.0 standard drinks of alcohol     Current Outpatient Medications:    acetaminophen (TYLENOL) 325 MG tablet, Take 1-2 tablets (  325-650 mg total) by mouth every 4 (four) hours as needed for mild pain., Disp: , Rfl:    amiodarone (PACERONE) 200 MG tablet, Take 1 tablet (200 mg total) by mouth daily., Disp: 90 tablet, Rfl: 3   amLODipine (NORVASC) 5 MG tablet, Take 1 tablet (5 mg total) by mouth every evening., Disp: 90 tablet, Rfl: 1   aspirin 81 MG tablet, Take 81 mg by mouth daily., Disp: , Rfl:    atorvastatin (LIPITOR) 40 MG tablet, TAKE 1 TABLET DAILY, Disp: 90 tablet, Rfl: 3   Cholecalciferol (VITAMIN D) 50 MCG (2000 UT) tablet, Take 2,000 Units by mouth daily., Disp: , Rfl:    docusate sodium (COLACE) 100 MG capsule, Take 100 mg by mouth 2 (two) times daily. Pt takes every other day, Disp: , Rfl:    ferrous sulfate 325 (65 FE) MG tablet, Take 1 tablet (325 mg total) by mouth 2 (two) times daily with a meal. Add colace 100 mg (Patient taking differently: Take 325 mg by mouth daily with breakfast.), Disp: 180 tablet, Rfl: 1   fluorouracil (EFUDEX) 5 % cream, Apply topically., Disp: , Rfl:    glipiZIDE (GLUCOTROL XL) 2.5 MG 24 hr tablet, TAKE 1 TABLET DAILY WITH BREAKFAST, Disp: 90 tablet, Rfl: 0   glucose blood test strip, Use as instructed, Disp: 100 each, Rfl: 12   ipratropium (ATROVENT) 0.03 % nasal spray, Place 2 sprays into both nostrils every 12 (twelve) hours., Disp: 90 mL, Rfl: 1   KLOR-CON M20 20 MEQ tablet, Take 20 mEq by mouth daily., Disp: , Rfl:    magnesium oxide (MAG-OX) 400 (241.3 Mg) MG tablet, Take 1 tablet (400 mg total) by  mouth 2 (two) times daily., Disp: 60 tablet, Rfl: 0   metFORMIN (GLUCOPHAGE-XR) 750 MG 24 hr tablet, TAKE 2 TABLETS DAILY WITH BREAKFAST, Disp: 180 tablet, Rfl: 1   metoprolol succinate (TOPROL-XL) 25 MG 24 hr tablet, Take 1 tablet (25 mg total) by mouth every evening., Disp: 90 tablet, Rfl: 3   Multiple Vitamins-Minerals (CENTRUM SILVER PO), Take 1 tablet by mouth daily. , Disp: , Rfl:    omeprazole (PRILOSEC) 40 MG capsule, Take 40 mg by mouth daily., Disp: , Rfl:    valsartan-hydrochlorothiazide (DIOVAN-HCT) 320-12.5 MG tablet, Take 1 tablet by mouth daily., Disp: 90 tablet, Rfl: 1  No Known Allergies  I personally reviewed active problem list, medication list, allergies, family history, social history, health maintenance with the patient/caregiver today.   ROS  Constitutional: Negative for fever or weight change.  Respiratory: Negative for cough and shortness of breath.   Cardiovascular: Negative for chest pain or palpitations.  Gastrointestinal: Negative for abdominal pain, no bowel changes.  Musculoskeletal: positive  for gait problem and has intermittent left  joint swelling.  Skin: Negative for rash.  Neurological: Negative for dizziness or headache.  No other specific complaints in a complete review of systems (except as listed in HPI above).   Objective  Vitals:   08/07/22 0956  BP: 132/72  Pulse: 60  Resp: 16  Temp: 97.7 F (36.5 C)  TempSrc: Oral  SpO2: 94%  Weight: 201 lb 8 oz (91.4 kg)  Height: '5\' 6"'$  (1.676 m)    Body mass index is 32.52 kg/m.  Physical Exam  Constitutional: Patient appears well-developed and well-nourished. Obese  No distress.  HEENT: head atraumatic, normocephalic, pupils equal and reactive to light,, neck supple Cardiovascular: Normal rate, regular rhythm and normal heart sounds.  No murmur heard. No BLE edema. Pulmonary/Chest: Effort  normal and breath sounds normal. No respiratory distress. Abdominal: Soft.  There is no  tenderness. Skin: scar from recent nose surgery done by dermatologist  Psychiatric: Patient has a normal mood and affect. behavior is normal. Judgment and thought content normal.   PHQ2/9:    08/07/2022    9:58 AM 05/01/2022   10:15 AM 04/06/2022   10:56 AM 12/06/2021   10:46 AM 08/08/2021    9:26 AM  Depression screen PHQ 2/9  Decreased Interest 0 0 0 0 0  Down, Depressed, Hopeless 0 0 0 0 0  PHQ - 2 Score 0 0 0 0 0  Altered sleeping 0 0 0 0 0  Tired, decreased energy 0 1 0 0 0  Change in appetite 0 0 0 0 0  Feeling bad or failure about yourself  0 0 0 0 0  Trouble concentrating 0 0 0 0 0  Moving slowly or fidgety/restless 0 0 0 0 0  Suicidal thoughts 0 0 0 0 0  PHQ-9 Score 0 1 0 0 0    phq 9 is negative   Fall Risk:    08/07/2022    9:57 AM 05/01/2022   10:17 AM 04/06/2022   10:56 AM 12/06/2021   10:46 AM 08/08/2021    9:25 AM  Fall Risk   Falls in the past year? 0 0 0 0 0  Number falls in past yr:  0 0 0 0  Injury with Fall?  0 0 0 0  Risk for fall due to : No Fall Risks No Fall Risks No Fall Risks No Fall Risks No Fall Risks  Follow up Falls prevention discussed;Education provided Falls evaluation completed Falls prevention discussed Falls prevention discussed Falls prevention discussed      Functional Status Survey: Is the patient deaf or have difficulty hearing?: Yes (wears aid) Does the patient have difficulty seeing, even when wearing glasses/contacts?: No Does the patient have difficulty concentrating, remembering, or making decisions?: No Does the patient have difficulty walking or climbing stairs?: Yes Does the patient have difficulty dressing or bathing?: No Does the patient have difficulty doing errands alone such as visiting a doctor's office or shopping?: No    Assessment & Plan  1. Type 2 diabetes mellitus with microalbuminuria, without long-term current use of insulin (HCC)  - POCT HgB A1C  2. History of basal cell carcinoma (BCC)  Keep follow  up with [facility]   3. Dyslipidemia associated with type 2 diabetes mellitus (HCC)  Resume low carb diet   4. Meningioma [facility])  He has an upcoming visit with neurosurgeon   5. Atherosclerosis of native coronary artery of native heart with stable angina pectoris [facility])  On medical management , sees cardiologist once a year and doing well   6. Senile purpura ([place])  Reassurance given   7. Simple chronic bronchitis (HCC)  Stable   8. Vasomotor rhinitis  Improved with Atrovaent   9. SVT (supraventricular tachycardia)  Rate controlled, taking medications as prescribed   10. 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 Gastroesophageal reflux disease without esophagitis  Controlled with life style modification   12. Obstructive sleep apnea on CPAP  Continue CPAP  13. Primary osteoarthritis of both knees

## 2022-08-07 ENCOUNTER — Encounter: Payer: Self-pay | Admitting: Family Medicine

## 2022-08-07 ENCOUNTER — Ambulatory Visit (INDEPENDENT_AMBULATORY_CARE_PROVIDER_SITE_OTHER): Payer: Medicare Other | Admitting: Family Medicine

## 2022-08-07 VITALS — BP 132/72 | HR 60 | Temp 97.7°F | Resp 16 | Ht 66.0 in | Wt 201.5 lb

## 2022-08-07 DIAGNOSIS — G4733 Obstructive sleep apnea (adult) (pediatric): Secondary | ICD-10-CM | POA: Diagnosis not present

## 2022-08-07 DIAGNOSIS — J410 Simple chronic bronchitis: Secondary | ICD-10-CM | POA: Diagnosis not present

## 2022-08-07 DIAGNOSIS — Z8679 Personal history of other diseases of the circulatory system: Secondary | ICD-10-CM | POA: Diagnosis not present

## 2022-08-07 DIAGNOSIS — Z85828 Personal history of other malignant neoplasm of skin: Secondary | ICD-10-CM | POA: Diagnosis not present

## 2022-08-07 DIAGNOSIS — I10 Essential (primary) hypertension: Secondary | ICD-10-CM | POA: Diagnosis not present

## 2022-08-07 DIAGNOSIS — K219 Gastro-esophageal reflux disease without esophagitis: Secondary | ICD-10-CM

## 2022-08-07 DIAGNOSIS — R809 Proteinuria, unspecified: Secondary | ICD-10-CM | POA: Diagnosis not present

## 2022-08-07 DIAGNOSIS — D329 Benign neoplasm of meninges, unspecified: Secondary | ICD-10-CM | POA: Diagnosis not present

## 2022-08-07 DIAGNOSIS — I471 Supraventricular tachycardia, unspecified: Secondary | ICD-10-CM | POA: Diagnosis not present

## 2022-08-07 DIAGNOSIS — M170 Bilateral primary osteoarthritis of knee: Secondary | ICD-10-CM

## 2022-08-07 DIAGNOSIS — E785 Hyperlipidemia, unspecified: Secondary | ICD-10-CM | POA: Diagnosis not present

## 2022-08-07 DIAGNOSIS — J300 Vasomotor rhinitis: Secondary | ICD-10-CM | POA: Diagnosis not present

## 2022-08-07 DIAGNOSIS — D692 Other nonthrombocytopenic purpura: Secondary | ICD-10-CM | POA: Diagnosis not present

## 2022-08-07 DIAGNOSIS — E1129 Type 2 diabetes mellitus with other diabetic kidney complication: Secondary | ICD-10-CM | POA: Diagnosis not present

## 2022-08-07 DIAGNOSIS — I25118 Atherosclerotic heart disease of native coronary artery with other forms of angina pectoris: Secondary | ICD-10-CM

## 2022-08-07 DIAGNOSIS — Z862 Personal history of diseases of the blood and blood-forming organs and certain disorders involving the immune mechanism: Secondary | ICD-10-CM | POA: Diagnosis not present

## 2022-08-07 DIAGNOSIS — E1169 Type 2 diabetes mellitus with other specified complication: Secondary | ICD-10-CM | POA: Diagnosis not present

## 2022-08-07 LAB — POCT GLYCOSYLATED HEMOGLOBIN (HGB A1C): Hemoglobin A1C: 6.7 % — AB (ref 4.0–5.6)

## 2022-08-08 LAB — MICROALBUMIN / CREATININE URINE RATIO
Creatinine, Urine: 37 mg/dL (ref 20–320)
Microalb Creat Ratio: 24 mcg/mg creat (ref <30)
Microalb, Ur: 0.9 mg/dL

## 2022-08-16 DIAGNOSIS — D329 Benign neoplasm of meninges, unspecified: Secondary | ICD-10-CM | POA: Diagnosis not present

## 2022-08-20 ENCOUNTER — Other Ambulatory Visit: Payer: Self-pay | Admitting: Neurology

## 2022-08-20 DIAGNOSIS — D329 Benign neoplasm of meninges, unspecified: Secondary | ICD-10-CM

## 2022-09-04 ENCOUNTER — Ambulatory Visit: Payer: Medicare Other

## 2022-09-04 ENCOUNTER — Ambulatory Visit
Admission: RE | Admit: 2022-09-04 | Discharge: 2022-09-04 | Disposition: A | Discharge status: Home / Self Care | Payer: Medicare Other | Source: Ambulatory Visit | Attending: Neurology | Admitting: Neurology

## 2022-09-04 DIAGNOSIS — G9389 Other specified disorders of brain: Secondary | ICD-10-CM | POA: Diagnosis not present

## 2022-09-04 DIAGNOSIS — Z982 Presence of cerebrospinal fluid drainage device: Secondary | ICD-10-CM | POA: Diagnosis not present

## 2022-09-04 DIAGNOSIS — D329 Benign neoplasm of meninges, unspecified: Secondary | ICD-10-CM | POA: Diagnosis not present

## 2022-09-04 DIAGNOSIS — G319 Degenerative disease of nervous system, unspecified: Secondary | ICD-10-CM | POA: Diagnosis not present

## 2022-09-04 MED ORDER — GADOBUTROL 1 MMOL/ML IV SOLN
9.0000 mL | Freq: Once | INTRAVENOUS | Status: AC | PRN
  Administered 2022-09-04: 9 mL via INTRAVENOUS

## 2022-09-12 ENCOUNTER — Other Ambulatory Visit: Payer: Self-pay | Admitting: Family Medicine

## 2022-09-12 DIAGNOSIS — I10 Essential (primary) hypertension: Secondary | ICD-10-CM

## 2022-09-12 DIAGNOSIS — R809 Proteinuria, unspecified: Secondary | ICD-10-CM

## 2022-09-12 DIAGNOSIS — I152 Hypertension secondary to endocrine disorders: Secondary | ICD-10-CM

## 2022-10-03 LAB — HM DIABETES EYE EXAM

## 2022-10-12 ENCOUNTER — Other Ambulatory Visit: Payer: Self-pay | Admitting: Family Medicine

## 2022-10-12 DIAGNOSIS — E1169 Type 2 diabetes mellitus with other specified complication: Secondary | ICD-10-CM

## 2022-10-12 DIAGNOSIS — E1129 Type 2 diabetes mellitus with other diabetic kidney complication: Secondary | ICD-10-CM

## 2022-11-01 ENCOUNTER — Other Ambulatory Visit: Payer: Self-pay | Admitting: Family Medicine

## 2022-11-01 DIAGNOSIS — E1169 Type 2 diabetes mellitus with other specified complication: Secondary | ICD-10-CM

## 2022-12-05 NOTE — Progress Notes (Unsigned)
Name: Jason Farrell.   MRN: [id number]    DOB: 1940-07-29   Date:12/06/2022       Progress Note  Subjective  Chief Complaint  Follow Up  HPI  Meningioma: he had a partial resection done at [facility] by Dr. Adriana Simas in 2020, he states he has been doing well, since last visit with Dr. Adriana Simas he states his memory problems have resolved, no longer having confusion, states no balanced problems, he denies headaches, no blurred or double vision. He saw Dr. Sherryll Burger in Feb and repeat MRI was stable   MRI brain 07/22 1. 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 2. Stable appearance of small left frontal convexity meningioma. 3. 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  IMPRESSION:08/2022   1. 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 2. Unchanged 1.3 cm left frontal convexity meningioma. 3. Mild enlargement of a 2.8 cm left parotid mass.   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 A1C went from 6.5 % to 7.3 % when we stopped Januvia , and last visit it was 6.8 % today it is down to 5.8 %  x 2  up to  6.1 % and and last visit it was  6.8 % we will send him to the lab today.  He has associated dyslipidemia, HTN and obesity . He is on ARB, statin therapy and denies side effects of medications.   Claudication: he states he has aching on both legs when he walks for about 20 minutes , thigh and lower leg, he states he is walking daily with his dog again and also pushing the lawn mower and is feeling a little better now    Obesity:  weight is stable since last visit, wife is still carrying for her mother but now her mother in law is living with them and wife has been able to cook again    HTN:  He had tachycardia and placed on  amiodarone that was recently filled by Dr. Mariah Milling, he is on Toprol XL 25 mg daily( dosed decreased in July due to bradycardia) , Norvasc 5 mg and Valsartan hctz 320/25 , bp at home was low and we adjusted dose of vaslartan hctz to 320/12.5 mg and he was doing well, however today his bp is above goal. Advised patient to monitor bp at home and return in 2 weeks for bp check only , we may need to adjust dose of HCTZ again   Chronic bronchitis  with history of tobacco use: he denies daily cough, mild SOB with moderate activity.  Quit smoking in 1996, after MI. He is not interested in taking inhalers    S/p MI and and history of stent placement/CAD with stable angina : sees Dr. Mariah Milling  last visit July 2022, no recent episodes of chest pain or palpitation, he has been active lately and going okay . 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  He denies any palpitation lately , his last TSH was done 01/2022 and within normal limits . Unchanged   OA knee he was advised to have a knee replacement a few years ago but he decided to hold off, he states symptoms worse on left side, still has intermittent effusion but stable. 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 He is asymptomatic   Hearing loss: he recently got an otc hearing aid and is doing well . He did not wear it today   History of iron deficiency anemia: diagnosed 2021, hemoccult stools negative times 3, he has been taking iron supplementation, denies pica, last level was normal .  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    Status post craniotomy 08/12/2018   Senile purpura (HCC) 02/21/2018   Type 2 diabetes mellitus with microalbuminuria, without long-term current use of insulin (HCC) 02/21/2018   Osteoarthritis of hip 11/04/2017   Obstructive sleep apnea on CPAP 12/09/2015   OA (osteoarthritis) of knee 05/06/2015   Gastroesophageal reflux disease without esophagitis 05/06/2015   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) 01/25/2015   Hyperlipidemia 08/16/2014   Essential hypertension 08/16/2014   Stopped smoking with greater than 30 pack year history 08/16/2014   COLD (chronic obstructive lung disease) (HCC) 08/16/2014    Past Surgical History:  Procedure Laterality Date   CARDIAC CATHETERIZATION  2006   stent placement   COLONOSCOPY     CORONARY ANGIOPLASTY WITH STENT PLACEMENT  2006   stent x 2    CORONARY ANGIOPLASTY WITH STENT PLACEMENT  1996   stent x 3    CRANIOTOMY  08/12/2018   FOOT SURGERY Right    HALLUX VALGUS CHEILECTOMY Right 02/25/2015   Procedure: HALLUX VALGUS CHEILECTOMY;  Surgeon: Recardo Evangelist, DPM;  Location: [facility] ORS;  Service: Podiatry;  Laterality: Right;    Family History  Problem Relation Age of Onset   Hyperlipidemia Father    Hypertension Father    Alcohol abuse Father    Cancer Sister        lung   Hypertension Sister    Heart disease Sister    Hyperlipidemia Sister    COPD Sister    Cancer Sister        unknown    Social  History   Tobacco Use   Smoking status: Former    Packs/day: 1.50    Years: 35.00    Additional pack years: 0.00    Total pack years: 52.50    Types: Cigarettes    Start date: 07/17/1959    Quit date: 08/27/1994    Years since quitting: 28.2   Smokeless tobacco: Never   Tobacco comments:    smoking cessation materials not required  Substance Use Topics   Alcohol use: Not Currently    Alcohol/week: 0.0 standard drinks of  alcohol     Current Outpatient Medications:    acetaminophen (TYLENOL) 325 MG tablet, Take 1-2 tablets (325-650 mg total) by mouth every 4 (four) hours as needed for mild pain., Disp: , Rfl:    amiodarone (PACERONE) 200 MG tablet, Take 1 tablet (200 mg total) by mouth daily., Disp: 90 tablet, Rfl: 3   amLODipine (NORVASC) 5 MG tablet, Take 1 tablet (5 mg total) by mouth every evening., Disp: 90 tablet, Rfl: 1   aspirin 81 MG tablet, Take 81 mg by mouth daily., Disp: , Rfl:    atorvastatin (LIPITOR) 40 MG tablet, TAKE 1 TABLET DAILY, Disp: 90 tablet, Rfl: 3   Cholecalciferol (VITAMIN D) 50 MCG (2000 UT) tablet, Take 2,000 Units by mouth daily., Disp: , Rfl:    docusate sodium (COLACE) 100 MG capsule, Take 100 mg by mouth 2 (two) times daily. Pt takes every other day, Disp: , Rfl:    ferrous sulfate 325 (65 FE) MG tablet, Take 1 tablet (325 mg total) by mouth 2 (two) times daily with a meal. Add colace 100 mg (Patient taking differently: Take 325 mg by mouth daily with breakfast.), Disp: 180 tablet, Rfl: 1   fluorouracil (EFUDEX) 5 % cream, Apply topically., Disp: , Rfl:    glipiZIDE (GLUCOTROL XL) 2.5 MG 24 hr tablet, TAKE 1 TABLET DAILY WITH BREAKFAST, Disp: 90 tablet, Rfl: 3   glucose blood test strip, Use as instructed, Disp: 100 each, Rfl: 12   ipratropium (ATROVENT) 0.03 % nasal spray, Place 2 sprays into both nostrils every 12 (twelve) hours., Disp: 90 mL, Rfl: 1   KLOR-CON M20 20 MEQ tablet, Take 20 mEq by mouth daily., Disp: , Rfl:    magnesium oxide (MAG-OX)  400 (241.3 Mg) MG tablet, Take 1 tablet (400 mg total) by mouth 2 (two) times daily., Disp: 60 tablet, Rfl: 0   metFORMIN (GLUCOPHAGE-XR) 750 MG 24 hr tablet, TAKE 2 TABLETS DAILY WITH BREAKFAST, Disp: 180 tablet, Rfl: 0   metoprolol succinate (TOPROL-XL) 25 MG 24 hr tablet, Take 1 tablet (25 mg total) by mouth every evening., Disp: 90 tablet, Rfl: 3   Multiple Vitamins-Minerals (CENTRUM SILVER PO), Take 1 tablet by mouth daily. , Disp: , Rfl:    omeprazole (PRILOSEC) 40 MG capsule, Take 40 mg by mouth daily., Disp: , Rfl:    valsartan-hydrochlorothiazide (DIOVAN-HCT) 320-12.5 MG tablet, TAKE 1 TABLET DAILY, Disp: 90 tablet, Rfl: 3  No Known Allergies  I personally reviewed active problem list, medication list, allergies, family history, social history, health maintenance with the patient/caregiver today.   ROS  Ten systems reviewed and is negative except as mentioned in HPI   Objective  Vitals:   12/06/22 0923 12/06/22 0944  BP: (!) 150/64 (!) 150/50  Pulse: (!) 56   Resp: 18   Temp: 97.7 F (36.5 C)   TempSrc: Oral   SpO2: 98%   Weight: 201 lb 4.8 oz (91.3 kg)   Height: 5\' 6"  (1.676 m)     Body mass index is 32.49 kg/m.  Physical Exam  Constitutional: Patient appears well-developed and well-nourished. Obese  No distress.  HEENT: head atraumatic, normocephalic, pupils equal and reactive to light, neck supple Cardiovascular: Normal rate, regular rhythm and normal heart sounds.  No murmur heard. 1 plus pitting BLE edema. Pulmonary/Chest: Effort normal , mild rhonchi No respiratory distress. Abdominal: Soft.  There is no tenderness. Psychiatric: Patient has a normal mood and affect. behavior is normal. Judgment and thought content normal.   Diabetic Foot  Exam:     PHQ2/9:    12/06/2022    9:29 AM 08/07/2022    9:58 AM 05/01/2022   10:15 AM 04/06/2022   10:56 AM 12/06/2021   10:46 AM  Depression screen PHQ 2/9  Decreased Interest 0 0 0 0 0  Down, Depressed, Hopeless  0 0 0 0 0  PHQ - 2 Score 0 0 0 0 0  Altered sleeping 0 0 0 0 0  Tired, decreased energy 0 0 1 0 0  Change in appetite 0 0 0 0 0  Feeling bad or failure about yourself  0 0 0 0 0  Trouble concentrating 0 0 0 0 0  Moving slowly or fidgety/restless 0 0 0 0 0  Suicidal thoughts 0 0 0 0 0  PHQ-9 Score 0 0 1 0 0    phq 9 is negative   Fall Risk:    12/06/2022    9:29 AM 08/07/2022    9:57 AM 05/01/2022   10:17 AM 04/06/2022   10:56 AM 12/06/2021   10:46 AM  Fall Risk   Falls in the past year? 0 0 0 0 0  Number falls in past yr:   0 0 0  Injury with Fall?   0 0 0  Risk for fall due to : No Fall Risks No Fall Risks No Fall Risks No Fall Risks No Fall Risks  Follow up Falls prevention discussed Falls prevention discussed;Education provided Falls evaluation completed Falls prevention discussed Falls prevention discussed      Functional Status Survey: Is the patient deaf or have difficulty hearing?: Yes Does the patient have difficulty seeing, even when wearing glasses/contacts?: No Does the patient have difficulty concentrating, remembering, or making decisions?: No Does the patient have difficulty walking or climbing stairs?: Yes Does the patient have difficulty dressing or bathing?: No Does the patient have difficulty doing errands alone such as visiting a doctor's office or shopping?: No    Assessment & Plan  1. Type 2 diabetes mellitus with microalbuminuria, without long-term current use of insulin (HCC)  - HM Diabetes Foot Exam - HgB A1c  2. Meningioma (HCC)  Up to date with follow up  3. Atherosclerosis of native coronary artery of native heart with stable angina pectoris (HCC)  On statin therapy, denies chest pain  4. Senile purpura (HCC)  On arms   5. Dyslipidemia associated with type 2 diabetes mellitus (HCC)   6. History of basal cell carcinoma (BCC)  Keep follow up with dermatologist   7. Simple chronic bronchitis (HCC)  Rhonchi on exam, he quit  smoking, may need spirometry  8. Morbid obesity (HCC)  Discussed with the patient the risk posed by an increased BMI. Discussed importance of portion control, calorie counting and at least 150 minutes of physical activity weekly. Avoid sweet beverages and drink more water. Eat at least 6 servings of fruit and vegetables daily    9. Claudication of both lower extremities (HCC)   10. Obstructive sleep apnea on CPAP

## 2022-12-06 ENCOUNTER — Ambulatory Visit (INDEPENDENT_AMBULATORY_CARE_PROVIDER_SITE_OTHER): Payer: Medicare Other | Admitting: Family Medicine

## 2022-12-06 ENCOUNTER — Encounter: Payer: Self-pay | Admitting: Family Medicine

## 2022-12-06 VITALS — BP 150/50 | HR 56 | Temp 97.7°F | Resp 18 | Ht 66.0 in | Wt 201.3 lb

## 2022-12-06 DIAGNOSIS — I739 Peripheral vascular disease, unspecified: Secondary | ICD-10-CM | POA: Diagnosis not present

## 2022-12-06 DIAGNOSIS — D329 Benign neoplasm of meninges, unspecified: Secondary | ICD-10-CM | POA: Diagnosis not present

## 2022-12-06 DIAGNOSIS — Z85828 Personal history of other malignant neoplasm of skin: Secondary | ICD-10-CM

## 2022-12-06 DIAGNOSIS — G4733 Obstructive sleep apnea (adult) (pediatric): Secondary | ICD-10-CM | POA: Diagnosis not present

## 2022-12-06 DIAGNOSIS — I25118 Atherosclerotic heart disease of native coronary artery with other forms of angina pectoris: Secondary | ICD-10-CM

## 2022-12-06 DIAGNOSIS — D692 Other nonthrombocytopenic purpura: Secondary | ICD-10-CM

## 2022-12-06 DIAGNOSIS — J410 Simple chronic bronchitis: Secondary | ICD-10-CM | POA: Diagnosis not present

## 2022-12-06 DIAGNOSIS — E785 Hyperlipidemia, unspecified: Secondary | ICD-10-CM

## 2022-12-06 DIAGNOSIS — E1129 Type 2 diabetes mellitus with other diabetic kidney complication: Secondary | ICD-10-CM | POA: Diagnosis not present

## 2022-12-06 DIAGNOSIS — Z7984 Long term (current) use of oral hypoglycemic drugs: Secondary | ICD-10-CM

## 2022-12-06 DIAGNOSIS — R809 Proteinuria, unspecified: Secondary | ICD-10-CM

## 2022-12-06 DIAGNOSIS — E1169 Type 2 diabetes mellitus with other specified complication: Secondary | ICD-10-CM | POA: Diagnosis not present

## 2022-12-07 LAB — HEMOGLOBIN A1C
Hgb A1c MFr Bld: 6.5 %{Hb} — ABNORMAL HIGH
Mean Plasma Glucose: 140 mg/dL
eAG (mmol/L): 7.7 mmol/L

## 2022-12-13 DIAGNOSIS — Z85828 Personal history of other malignant neoplasm of skin: Secondary | ICD-10-CM | POA: Diagnosis not present

## 2022-12-13 DIAGNOSIS — R208 Other disturbances of skin sensation: Secondary | ICD-10-CM | POA: Diagnosis not present

## 2022-12-13 DIAGNOSIS — L821 Other seborrheic keratosis: Secondary | ICD-10-CM | POA: Diagnosis not present

## 2022-12-13 DIAGNOSIS — D2261 Melanocytic nevi of right upper limb, including shoulder: Secondary | ICD-10-CM | POA: Diagnosis not present

## 2022-12-13 DIAGNOSIS — L570 Actinic keratosis: Secondary | ICD-10-CM | POA: Diagnosis not present

## 2022-12-13 DIAGNOSIS — D2262 Melanocytic nevi of left upper limb, including shoulder: Secondary | ICD-10-CM | POA: Diagnosis not present

## 2022-12-13 DIAGNOSIS — D2271 Melanocytic nevi of right lower limb, including hip: Secondary | ICD-10-CM | POA: Diagnosis not present

## 2022-12-13 DIAGNOSIS — D2272 Melanocytic nevi of left lower limb, including hip: Secondary | ICD-10-CM | POA: Diagnosis not present

## 2022-12-13 DIAGNOSIS — D225 Melanocytic nevi of trunk: Secondary | ICD-10-CM | POA: Diagnosis not present

## 2022-12-13 DIAGNOSIS — D485 Neoplasm of uncertain behavior of skin: Secondary | ICD-10-CM | POA: Diagnosis not present

## 2022-12-13 DIAGNOSIS — C44622 Squamous cell carcinoma of skin of right upper limb, including shoulder: Secondary | ICD-10-CM | POA: Diagnosis not present

## 2022-12-19 ENCOUNTER — Other Ambulatory Visit: Payer: Self-pay | Admitting: Internal Medicine

## 2022-12-19 ENCOUNTER — Other Ambulatory Visit: Payer: Self-pay | Admitting: Family Medicine

## 2022-12-19 ENCOUNTER — Ambulatory Visit: Payer: Medicare Other

## 2022-12-19 DIAGNOSIS — I10 Essential (primary) hypertension: Secondary | ICD-10-CM

## 2022-12-19 DIAGNOSIS — I152 Hypertension secondary to endocrine disorders: Secondary | ICD-10-CM

## 2022-12-19 DIAGNOSIS — E1159 Type 2 diabetes mellitus with other circulatory complications: Secondary | ICD-10-CM

## 2022-12-19 MED ORDER — VALSARTAN-HYDROCHLOROTHIAZIDE 320-25 MG PO TABS: 1.0000 | ORAL_TABLET | Freq: Every day | ORAL | 1 refills | Status: DC

## 2022-12-19 NOTE — Telephone Encounter (Signed)
Requested medication (s) are due for refill today - unknown  Requested medication (s) are on the active medication list -yes  Future visit scheduled -yes  Last refill: 06/22/22  Notes to clinic: medication listed as historical medication   Requested Prescriptions  Pending Prescriptions Disp Refills   KLOR-CON M20 20 MEQ tablet [Pharmacy Med Name: POTASSIUM CHLORIDE ER (DISP) TABS 20MEQ] 90 tablet 3    Sig: TAKE 1 TABLET DAILY     Endocrinology:  Minerals - Potassium Supplementation Passed - 12/19/2022 12:46 AM      Passed - K in normal range and within 360 days    Potassium  Date Value Ref Range Status  04/06/2022 5.1 3.5 - 5.3 mmol/L Final         Passed - Cr in normal range and within 360 days    Creat  Date Value Ref Range Status  04/06/2022 0.94 0.70 - 1.28 mg/dL Final   Creatinine, Urine  Date Value Ref Range Status  08/07/2022 37 20 - 320 mg/dL Final         Passed - Valid encounter within last 12 months    Recent Outpatient Visits           1 week ago Type 2 diabetes mellitus with microalbuminuria, without long-term current use of insulin (HCC)   [place] [facility] Alba Cory, MD   4 months ago Type 2 diabetes mellitus with microalbuminuria, without long-term current use of insulin [facility])   [place] [facility] Alba Cory, MD   8 months ago Type 2 diabetes mellitus with microalbuminuria, without long-term current use of insulin [facility])   [place] [facility] Alba Cory, MD   1 year ago Type 2 diabetes mellitus with microalbuminuria, without long-term current use of insulin [facility])   [facility] Alba Cory, MD   1 year ago Diabetes mellitus type 2 in obese [facility])   [facility] Alba Cory, MD       Future Appointments             In 3 months Carlynn Purl, Danna Hefty, MD [facility], [facility]                Requested Prescriptions  Pending Prescriptions Disp Refills   KLOR-CON M20 20 MEQ tablet [Pharmacy Med Name: POTASSIUM CHLORIDE ER (DISP) TABS 20MEQ] 90 tablet 3    Sig: TAKE 1 TABLET DAILY     Endocrinology:  Minerals - Potassium Supplementation Passed - 12/19/2022 12:46 AM      Passed - K in normal range and within 360 days    Potassium  Date Value Ref Range Status  04/06/2022 5.1 3.5 - 5.3 mmol/L Final         Passed - Cr in normal range and within 360 days    Creat  Date Value Ref Range Status  04/06/2022 0.94 0.70 - 1.28 mg/dL Final   Creatinine, Urine  Date Value Ref Range Status  08/07/2022 37 20 - 320 mg/dL Final         Passed - Valid encounter within last 12 months    Recent Outpatient Visits           1 week ago Type 2 diabetes mellitus with microalbuminuria, without long-term current use of insulin [facility])    [facility] Alba Cory, MD   4 months ago Type 2 diabetes mellitus with microalbuminuria, without long-term current use of insulin (HCC)  [facility] [place], Danna Hefty, MD   8 months ago Type 2 diabetes mellitus with microalbuminuria, without long-term current use of insulin [facility])   [facility] [place], Danna Hefty, MD   1 year ago Type 2 diabetes mellitus with microalbuminuria, without long-term current use of insulin [facility])   [place] [facility] Alba Cory, MD   1 year ago Diabetes mellitus type 2 in obese [facility])   [place] [facility] Alba Cory, MD       Future Appointments             In 3 months Carlynn Purl, Danna Hefty, MD [facility], [facility]

## 2022-12-19 NOTE — Progress Notes (Signed)
Patient here for Bp check, Per last visit:  HTN:  He had tachycardia and placed on amiodarone that was recently filled by Dr. Mariah Milling, he is on Toprol XL 25 mg daily( dosed decreased in July due to bradycardia) , Norvasc 5 mg and Valsartan hctz 320/25 , bp at home was low and we adjusted dose of vaslartan hctz to 320/12.5 mg and he was doing well, however today his bp is above goal. Advised patient to monitor bp at home and return in 2 weeks for bp check only , we may need to adjust dose of HCTZ again.  At home reading running in 140's/60's.  Bp today  is  140/60, and his cuff read 134/54.  Please advise

## 2023-01-10 ENCOUNTER — Other Ambulatory Visit: Payer: Self-pay | Admitting: Family Medicine

## 2023-01-10 DIAGNOSIS — R809 Proteinuria, unspecified: Secondary | ICD-10-CM

## 2023-01-10 DIAGNOSIS — E1169 Type 2 diabetes mellitus with other specified complication: Secondary | ICD-10-CM

## 2023-01-14 DIAGNOSIS — R4189 Other symptoms and signs involving cognitive functions and awareness: Secondary | ICD-10-CM | POA: Diagnosis not present

## 2023-01-14 DIAGNOSIS — D329 Benign neoplasm of meninges, unspecified: Secondary | ICD-10-CM | POA: Diagnosis not present

## 2023-01-22 DIAGNOSIS — D0461 Carcinoma in situ of skin of right upper limb, including shoulder: Secondary | ICD-10-CM | POA: Diagnosis not present

## 2023-01-22 DIAGNOSIS — C44622 Squamous cell carcinoma of skin of right upper limb, including shoulder: Secondary | ICD-10-CM | POA: Diagnosis not present

## 2023-01-30 ENCOUNTER — Other Ambulatory Visit: Payer: Self-pay | Admitting: Cardiovascular Disease

## 2023-01-30 NOTE — Telephone Encounter (Signed)
Please contact pt for future appointment. Pt overdue for 12 month f/u. 

## 2023-01-30 NOTE — Telephone Encounter (Signed)
Pt scheduled on 9/16

## 2023-03-29 NOTE — Progress Notes (Unsigned)
Date:  04/01/2023   ID:  Jason Oms., DOB February 01, 1941, MRN [id number]  Patient Location:  [address] [place] [address]   Provider location:   [facility] [place] office  PCP:  Alba Cory, MD  Cardiologist:  [facility]  Chief Complaint  Patient presents with   Follow-up    12 month follow up. Patient feels well. Medications reviewed verbally.     History of Present Illness:    Jason Treiber. is a 82 y.o. 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  Last seen in clinic July 2023  In follow-up reports he has been feeling well Works in the yard, denies chest pain or shortness of breath on exertion Has arthritis  BP elevated, reports taking his medications faithfully Has a blood pressure cuff at home  Lab work reviewed on today's visit A1C 6.5 Total chol 110, LDL 40  EKG Interpretation Date/Time:  Monday April 01 2023 10:43:18 EDT Ventricular Rate:  55 PR Interval:  254 QRS Duration:  150 QT Interval:  516 QTC Calculation: 493 R Axis:   42  Text Interpretation: Sinus bradycardia with 1st degree A-V block Right bundle branch block Cannot rule out Inferior infarct (cited on or before 12-Sep-2018) When compared with ECG of 18-Sep-2018 09:02, PR interval has increased Vent. rate has decreased BY  40 BPM Borderline criteria for Lateral infarct are no longer Present T wave inversion now evident in Inferior leads Confirmed by Julien Nordmann [phone]) on 04/01/2023 10:54:11 AM   Other past medical history reviewed In the hospital 09/2018 intermittent confusion with difficulty speaking and  visual changes since 06/2018 work-up revealing large meningioma at level of cerebral aqueduct with vasogenic edema posterior medial temporal lobes and obstructive hydrocephalus.    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 Thedore Mins.   Postop course  mental status changes with bouts of lethargy, confusion, problems with sleep-wake disruption with delirium, loss of bilateral vision as well as issues with leukocytosis and SVT. Amiodarone and B-blocker for SVT   Sister with lung cancer now in remission Reports he stopped smoking 1996  He stopped smoking in 1996 after his MI . Smoked 38 yrs   Past Medical History:  Diagnosis Date   Coronary artery disease    Diabetes mellitus without complication (HCC)    Dyslipidemia    GERD (gastroesophageal reflux disease)    Hyperlipidemia    Hypertension    MI (myocardial infarction) (HCC)    Osteoarthritis    Past Surgical History:  Procedure Laterality Date   CARDIAC CATHETERIZATION  2006   stent placement   COLONOSCOPY     CORONARY ANGIOPLASTY WITH STENT PLACEMENT  2006   stent x 2    CORONARY ANGIOPLASTY WITH STENT PLACEMENT  1996   stent x 3    CRANIOTOMY  08/12/2018   FOOT SURGERY Right    HALLUX VALGUS CHEILECTOMY Right 02/25/2015   Procedure: HALLUX VALGUS CHEILECTOMY;  Surgeon: Recardo Evangelist, DPM;  Location: [facility] ORS;  Service: Podiatry;  Laterality: Right;     Current Meds  Medication Sig   acetaminophen (TYLENOL) 325 MG  tablet Take 1-2 tablets (325-650 mg total) by mouth every 4 (four) hours as needed for mild pain.   amiodarone (PACERONE) 200 MG tablet TAKE 1 TABLET DAILY   amLODipine (NORVASC) 5 MG tablet TAKE 1 TABLET EVERY EVENING   aspirin 81 MG tablet Take 81 mg by mouth daily.   atorvastatin (LIPITOR) 40 MG tablet TAKE 1 TABLET DAILY   Cholecalciferol (VITAMIN D) 50 MCG (2000 UT) tablet Take 2,000 Units by mouth daily.   docusate sodium (COLACE) 100 MG capsule Take 100 mg by mouth 2 (two)  times daily. Pt takes every other day   ferrous sulfate 325 (65 FE) MG tablet Take 1 tablet (325 mg total) by mouth 2 (two) times daily with a meal. Add colace 100 mg (Patient taking differently: Take 325 mg by mouth daily with breakfast.)   fluorouracil (EFUDEX) 5 % cream Apply topically.   glipiZIDE (GLUCOTROL XL) 2.5 MG 24 hr tablet TAKE 1 TABLET DAILY WITH BREAKFAST   glucose blood test strip Use as instructed   ipratropium (ATROVENT) 0.03 % nasal spray Place 2 sprays into both nostrils every 12 (twelve) hours.   KLOR-CON M20 20 MEQ tablet TAKE 1 TABLET DAILY   magnesium oxide (MAG-OX) 400 (241.3 Mg) MG tablet Take 1 tablet (400 mg total) by mouth 2 (two) times daily.   metFORMIN (GLUCOPHAGE-XR) 750 MG 24 hr tablet TAKE 2 TABLETS DAILY WITH BREAKFAST   metoprolol succinate (TOPROL-XL) 25 MG 24 hr tablet Take 1 tablet (25 mg total) by mouth every evening.   Multiple Vitamins-Minerals (CENTRUM SILVER PO) Take 1 tablet by mouth daily.    omeprazole (PRILOSEC) 40 MG capsule Take 40 mg by mouth daily. prn   valsartan-hydrochlorothiazide (DIOVAN-HCT) 320-25 MG tablet Take 1 tablet by mouth daily.     Allergies:   Patient has no known allergies.   Social History   Tobacco Use   Smoking status: Former    Current packs/day: 0.00    Average packs/day: 1.5 packs/day for 35.1 years (52.7 ttl pk-yrs)    Types: Cigarettes    Start date: 07/17/1959    Quit date: 08/27/1994    Years since quitting: 28.6   Smokeless tobacco: Never   Tobacco comments:    smoking cessation materials not required  Vaping Use   Vaping status: Never Used  Substance Use Topics   Alcohol use: Not Currently    Alcohol/week: 0.0 standard drinks of alcohol   Drug use: No     Family Hx: The patient's family history includes Alcohol abuse in his father; COPD in his sister; Cancer in his sister and sister; Heart disease in his sister; Hyperlipidemia in his father and sister; Hypertension in his father and sister.  ROS:    Please see the history of present illness.    Review of Systems  Constitutional: Negative.   Respiratory: Negative.    Cardiovascular: Negative.   Gastrointestinal: Negative.   Musculoskeletal: Negative.   Neurological: Negative.   Psychiatric/Behavioral: Negative.    All other systems reviewed and are negative.    Labs/Other Tests and Data Reviewed:    Recent Labs: 04/06/2022: ALT 19; BUN 16; Creat 0.94; Hemoglobin 15.5; Magnesium 2.1; Platelets 275; Potassium 5.1; Sodium 141   Recent Lipid Panel Lab Results  Component Value Date/Time   CHOL 110 04/06/2022 11:37 AM   CHOL 126 11/07/2015 10:06 AM   TRIG 71 04/06/2022 11:37 AM   HDL 55 04/06/2022 11:37 AM   HDL 47 11/07/2015 10:06 AM   CHOLHDL 2.0  04/06/2022 11:37 AM   LDLCALC 40 04/06/2022 11:37 AM    Wt Readings from Last 3 Encounters:  04/01/23 200 lb 3.2 oz (90.8 kg)  12/06/22 201 lb 4.8 oz (91.3 kg)  08/07/22 201 lb 8 oz (91.4 kg)     Exam:    BP (!) 150/62 (BP Location: Left Arm, Patient Position: Sitting, Cuff Size: Normal)   Pulse (!) 55   Ht 5\' 6"  (1.676 m)   Wt 200 lb 3.2 oz (90.8 kg)   SpO2 96%   BMI 32.31 kg/m  Constitutional:  oriented to person, place, and time. No distress.  HENT:  Head: Grossly normal Eyes:  no discharge. No scleral icterus.  Neck: No JVD, no carotid bruits  Cardiovascular: Regular rate and rhythm, no murmurs appreciated Pulmonary/Chest: Clear to auscultation bilaterally, no wheezes or rails Abdominal: Soft.  no distension.  no tenderness.  Musculoskeletal: Normal range of motion Neurological:  normal muscle tone. Coordination normal. No atrophy Skin: Skin warm and dry Psychiatric: normal affect, pleasant   ASSESSMENT & PLAN:     Coronary artery disease involving native coronary artery of native heart without angina pectoris - Currently with no symptoms of angina. No further workup at this time. Continue current medication regimen. Current non-smoker, A1c trending higher,  cholesterol at goal  Essential hypertension - Blood pressure running high recommend he start Cardura 1 mg twice daily Will avoid higher dose amlodipine given chronic trace leg swelling Stay on metoprolol, valsartan HCTZ   Mixed hyperlipidemia Cholesterol is at goal on the current lipid regimen. No changes to the medications were made.   Controlled type 2 diabetes mellitus without complication, without long-term current use of insulin (HCC) Hemoglobin A1c mid 6 range, recommend low carbohydrate diet   Class 2 obesity due to excess calories without serious comorbidity in adult, unspecified BMI We have encouraged continued exercise, careful diet management in an effort to lose weight.  Centrilobular emphysema (HCC) 38 years of smoking, quit smoking Chronic mild shortness of breath on heavy exertion Recommend walking program, weight loss   Obstructive sleep apnea on CPAP Stable, on CPAP  SVT On amiodarone and beta-blocker,  Will check TSH, LFTs today   Total encounter time more than 30 minutes  Greater than 50% was spent in counseling and coordination of care with the patient    Signed, Julien Nordmann, MD  04/01/2023 11:02 AM    [facility] [address], [place], [place] [address]

## 2023-04-01 ENCOUNTER — Encounter: Payer: Self-pay | Admitting: Cardiovascular Disease

## 2023-04-01 ENCOUNTER — Ambulatory Visit: Payer: Medicare Other | Attending: Cardiovascular Disease | Admitting: Cardiovascular Disease

## 2023-04-01 ENCOUNTER — Other Ambulatory Visit: Payer: Self-pay | Admitting: Cardiovascular Disease

## 2023-04-01 VITALS — BP 150/62 | HR 55 | Ht 66.0 in | Wt 200.2 lb

## 2023-04-01 DIAGNOSIS — E1159 Type 2 diabetes mellitus with other circulatory complications: Secondary | ICD-10-CM | POA: Insufficient documentation

## 2023-04-01 DIAGNOSIS — E119 Type 2 diabetes mellitus without complications: Secondary | ICD-10-CM | POA: Insufficient documentation

## 2023-04-01 DIAGNOSIS — I10 Essential (primary) hypertension: Secondary | ICD-10-CM | POA: Insufficient documentation

## 2023-04-01 DIAGNOSIS — Z79899 Other long term (current) drug therapy: Secondary | ICD-10-CM | POA: Insufficient documentation

## 2023-04-01 DIAGNOSIS — I152 Hypertension secondary to endocrine disorders: Secondary | ICD-10-CM | POA: Diagnosis present

## 2023-04-01 DIAGNOSIS — I25118 Atherosclerotic heart disease of native coronary artery with other forms of angina pectoris: Secondary | ICD-10-CM | POA: Insufficient documentation

## 2023-04-01 DIAGNOSIS — J432 Centrilobular emphysema: Secondary | ICD-10-CM | POA: Insufficient documentation

## 2023-04-01 DIAGNOSIS — G4733 Obstructive sleep apnea (adult) (pediatric): Secondary | ICD-10-CM | POA: Diagnosis not present

## 2023-04-01 MED ORDER — DOXAZOSIN MESYLATE 1 MG PO TABS: 1.0000 mg | ORAL_TABLET | Freq: Two times a day (BID) | ORAL | 3 refills | Status: DC

## 2023-04-01 NOTE — Addendum Note (Signed)
Addended by: Jani Gravel on: 04/01/2023 11:18 AM   Modules accepted: Orders

## 2023-04-01 NOTE — Patient Instructions (Addendum)
Medication Instructions:  Please start cardura/doxazosin 1 mg twice a day  Please monitor blood pressure at home. Send blood pressures over MyChart or call the office.   If you need a refill on your cardiac medications before your next appointment, please call your pharmacy.   Lab work: Your provider would like for you to have following labs drawn today TSH and LFT.    Testing/Procedures: No new testing needed  Follow-Up: At [facility], you and your health needs are our priority.  As part of our continuing mission to provide you with exceptional heart care, we have created designated Provider Care Teams.  These Care Teams include your primary Cardiologist (physician) and Advanced Practice Providers (APPs -  Physician Assistants and Nurse Practitioners) who all work together to provide you with the care you need, when you need it.  You will need a follow up appointment in 12 months  Providers on your designated Care Team:   Nicolasa Ducking, NP Eula Listen, PA-C Cadence Fransico Michael, [place]  COVID-19 Vaccine Information can be found at: [email or web address] For questions related to vaccine distribution or appointments, please email [email or web address] or call [phone].

## 2023-04-02 LAB — HEPATIC FUNCTION PANEL
ALT: 19 IU/L (ref 0–44)
AST: 14 IU/L (ref 0–40)
Albumin: 4.5 g/dL (ref 3.8–4.8)
Alkaline Phosphatase: 90 IU/L (ref 44–121)
Bilirubin Total: 0.4 mg/dL (ref 0.0–1.2)
Bilirubin, Direct: 0.14 mg/dL (ref 0.00–0.40)
Total Protein: 6.6 g/dL (ref 6.0–8.5)

## 2023-04-02 LAB — TSH: TSH: 2.38 u[IU]/mL (ref 0.450–4.500)

## 2023-04-02 NOTE — Progress Notes (Unsigned)
Name: Jason Farrell.   MRN: [id number]    DOB: [birth year]   Date:04/03/2023       Progress Note  Subjective  Chief Complaint  Follow Up  HPI  Meningioma: he had a partial resection done at [facility] by Dr. Adriana Simas in 2020, he states he has been doing well, since last visit with Dr. Adriana Simas he states his memory problems have resolved, no longer having confusion, states no balanced problems, he denies headaches, no blurred or double vision. He had a repeat  MRI in Feb and saw Dr. Sherryll Burger in 01/2023 and is scheduled to follow up in 2 years   MRI brain 07/22 1. 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 2. Stable appearance of small left frontal convexity meningioma. 3. 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  IMPRESSION:08/2022   1. 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 2. Unchanged 1.3 cm left frontal convexity meningioma. 3. Mild enlargement of a 2.8 cm left parotid mass.    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 A1C went from 6.5 % to 7.3 % when we stopped Januvia ,  it went up to  6.8 % down to 5.8 %  x 2  up to  6.1 % and and last visit it was  6.8 %  since than it has been 6.5 % past two visits He has associated dyslipidemia, HTN and obesity . He is on ARB, statin therapy and denies side effects of medications. He denies hypoglycemic episodes   Claudication: he states he has aching on both legs when he walks for about 20 minutes , thigh and lower leg, he states he is walking daily with his dog again and also pushing the lawn mower and symptoms seems to have improved. On statin therapy and also takes aspirin daily     Obesity: he states he has been eating more than usual and weight is trending up. Discussed  importance of resuming a healthier diet    HTN:  He had tachycardia and placed on amiodarone that was recently filled by Dr. Mariah Milling, he is on Toprol XL 25 mg daily ( dosed decreased in July due to bradycardia) , Norvasc 5 mg and Valsartan hctz 320/25, we increased hydrochlorothiazide dose on his last visit since BP was higher than normal. He went to see Dr. Mariah Milling two days ago and BP was even higher ( he states he was in a rush but it was checked twice ) and he added Cardura 1 mg to take BID - he has not started medication yet and bp today is 130/68.  Advised him to monitor bp at home and also can stop by for CMA / BP check once he starts taking Cardura   Chronic bronchitis  with history of tobacco use: he denies daily cough, mild SOB with moderate activity.  Quit smoking in 1996, after MI. He is not interested in taking inhalers    S/p MI and and history of stent placement/CAD with stable angina : sees Dr. Mariah Milling  last visit July 2022, no recent episodes of chest pain or palpitation,he has been able to take care of his own yard for hours . He is taking Atorvastatin 40 mg and last LDL was at goal  40    OSA:  wearing CPAP , he is compliant with CPAP, wearing machine every night and keeps it on all night . 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  He denies any palpitation lately , his last TSH was done 03/2023 and within normal limits .   OA knee he was advised to have a knee replacement a few years ago but he decided to hold off, he states symptoms worse on left side, still has intermittent effusion but stable. 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 He is asymptomatic . Unchanged   Hearing loss: he recently  got an otc hearing aid and is doing well . He did not wear it today - he forgets to wear it   History of iron deficiency anemia: diagnosed 2021, hemoccult stools negative times 3, he has been taking iron supplementation, denies pica, last level was normal .  Last hemoglobin level was normal we will recheck with next lab drawn   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    Status post craniotomy 08/12/2018   Senile purpura (HCC) 02/21/2018   Type 2 diabetes mellitus with microalbuminuria, without long-term current use of insulin (HCC) 02/21/2018   Osteoarthritis of hip 11/04/2017   Obstructive sleep apnea on CPAP 12/09/2015   OA (osteoarthritis) of knee 05/06/2015   Gastroesophageal reflux disease without esophagitis 05/06/2015   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) 01/25/2015   Hyperlipidemia 08/16/2014   Essential hypertension 08/16/2014   Stopped smoking with greater than 30 pack year history 08/16/2014   COLD (chronic obstructive lung disease) (HCC) 08/16/2014    Past Surgical History:  Procedure Laterality Date   CARDIAC CATHETERIZATION  2006   stent placement   COLONOSCOPY     CORONARY ANGIOPLASTY WITH STENT PLACEMENT  2006   stent x 2    CORONARY ANGIOPLASTY WITH STENT PLACEMENT  1996   stent x 3    CRANIOTOMY  08/12/2018   FOOT SURGERY Right    HALLUX VALGUS CHEILECTOMY Right 02/25/2015   Procedure: HALLUX VALGUS CHEILECTOMY;  Surgeon: Recardo Evangelist, DPM;  Location: [facility] ORS;  Service: Podiatry;  Laterality: Right;    Family History  Problem Relation Age of Onset   Hyperlipidemia Father    Hypertension Father    Alcohol abuse  Father    Cancer Sister        lung   Hypertension Sister    Heart disease Sister    Hyperlipidemia Sister    COPD Sister    Cancer Sister        unknown    Social History   Tobacco Use   Smoking status: Former    Current packs/day: 0.00    Average packs/day: 1.5 packs/day for 35.1 years (52.7 ttl pk-yrs)    Types: Cigarettes    Start date: 07/17/1959    Quit date: 08/27/1994    Years  since quitting: 28.6   Smokeless tobacco: Never   Tobacco comments:    smoking cessation materials not required  Substance Use Topics   Alcohol use: Not Currently    Alcohol/week: 0.0 standard drinks of alcohol     Current Outpatient Medications:    acetaminophen (TYLENOL) 325 MG tablet, Take 1-2 tablets (325-650 mg total) by mouth every 4 (four) hours as needed for mild pain., Disp: , Rfl:    amiodarone (PACERONE) 200 MG tablet, TAKE 1 TABLET DAILY, Disp: 90 tablet, Rfl: 0   amLODipine (NORVASC) 5 MG tablet, TAKE 1 TABLET EVERY EVENING, Disp: 90 tablet, Rfl: 1   aspirin 81 MG tablet, Take 81 mg by mouth daily., Disp: , Rfl:    atorvastatin (LIPITOR) 40 MG tablet, TAKE 1 TABLET DAILY, Disp: 90 tablet, Rfl: 3   Cholecalciferol (VITAMIN D) 50 MCG (2000 UT) tablet, Take 2,000 Units by mouth daily., Disp: , Rfl:    docusate sodium (COLACE) 100 MG capsule, Take 100 mg by mouth 2 (two) times daily. Pt takes every other day, Disp: , Rfl:    doxazosin (CARDURA) 1 MG tablet, Take 1 tablet (1 mg total) by mouth 2 (two) times daily., Disp: 180 tablet, Rfl: 3   ferrous sulfate 325 (65 FE) MG tablet, Take 1 tablet (325 mg total) by mouth 2 (two) times daily with a meal. Add colace 100 mg (Patient taking differently: Take 325 mg by mouth daily with breakfast.), Disp: 180 tablet, Rfl: 1   fluorouracil (EFUDEX) 5 % cream, Apply topically., Disp: , Rfl:    glipiZIDE (GLUCOTROL XL) 2.5 MG 24 hr tablet, TAKE 1 TABLET DAILY WITH BREAKFAST, Disp: 90 tablet, Rfl: 3   glucose blood test strip, Use as instructed, Disp:  100 each, Rfl: 12   ipratropium (ATROVENT) 0.03 % nasal spray, Place 2 sprays into both nostrils every 12 (twelve) hours., Disp: 90 mL, Rfl: 1   KLOR-CON M20 20 MEQ tablet, TAKE 1 TABLET DAILY, Disp: 90 tablet, Rfl: 3   magnesium oxide (MAG-OX) 400 (241.3 Mg) MG tablet, Take 1 tablet (400 mg total) by mouth 2 (two) times daily., Disp: 60 tablet, Rfl: 0   metFORMIN (GLUCOPHAGE-XR) 750 MG 24 hr tablet, TAKE 2 TABLETS DAILY WITH BREAKFAST, Disp: 180 tablet, Rfl: 0   metoprolol succinate (TOPROL-XL) 25 MG 24 hr tablet, TAKE 1 TABLET EVERY EVENING, Disp: 90 tablet, Rfl: 3   Multiple Vitamins-Minerals (CENTRUM SILVER PO), Take 1 tablet by mouth daily. , Disp: , Rfl:    omeprazole (PRILOSEC) 40 MG capsule, Take 40 mg by mouth daily. prn, Disp: , Rfl:    valsartan-hydrochlorothiazide (DIOVAN-HCT) 320-25 MG tablet, Take 1 tablet by mouth daily., Disp: 90 tablet, Rfl: 1  No Known Allergies  I personally reviewed active problem list, medication list, allergies, family history, social history, health maintenance with the patient/caregiver today.   ROS  Ten systems reviewed and is negative except as mentioned in HPI    Objective  Vitals:   04/03/23 0918  BP: 130/68  Pulse: 61  Resp: 18  Temp: 98 F (36.7 C)  TempSrc: Oral  SpO2: 97%  Weight: 204 lb 8 oz (92.8 kg)  Height: 5\' 6"  (1.676 m)    Body mass index is 33.01 kg/m.  Physical Exam  Constitutional: Patient appears well-developed and well-nourished. Obese  No distress.  HEENT: head atraumatic, normocephalic, pupils equal and reactive to light, neck supple Cardiovascular: Normal rate, regular rhythm and normal heart sounds.  No murmur heard. No BLE edema. Pulmonary/Chest:  Effort normal and breath sounds normal. No respiratory distress. Abdominal: Soft.  There is no tenderness. Psychiatric: Patient has a normal mood and affect. behavior is normal. Judgment and thought content normal.   Recent Results (from the past 2160 hour(s))   Hepatic function panel     Status: None   Collection Time: 04/01/23 11:28 AM  Result Value Ref Range   Total Protein 6.6 6.0 - 8.5 g/dL   Albumin 4.5 3.8 - 4.8 g/dL   Bilirubin Total 0.4 0.0 - 1.2 mg/dL   Bilirubin, Direct 1.61 0.00 - 0.40 mg/dL   Alkaline Phosphatase 90 44 - 121 IU/L   AST 14 0 - 40 IU/L   ALT 19 0 - 44 IU/L  TSH     Status: None   Collection Time: 04/01/23 11:28 AM  Result Value Ref Range   TSH 2.380 0.450 - 4.500 uIU/mL  POCT HgB A1C     Status: Abnormal   Collection Time: 04/03/23  9:28 AM  Result Value Ref Range   Hemoglobin A1C 6.5 (A) 4.0 - 5.6 %   HbA1c POC (<> result, manual entry)     HbA1c, POC (prediabetic range)     HbA1c, POC (controlled diabetic range)      PHQ2/9:    04/03/2023    9:17 AM 12/06/2022    9:29 AM 08/07/2022    9:58 AM 05/01/2022   10:15 AM 04/06/2022   10:56 AM  Depression screen PHQ 2/9  Decreased Interest 0 0 0 0 0  Down, Depressed, Hopeless 0 0 0 0 0  PHQ - 2 Score 0 0 0 0 0  Altered sleeping 0 0 0 0 0  Tired, decreased energy 0 0 0 1 0  Change in appetite 0 0 0 0 0  Feeling bad or failure about yourself  0 0 0 0 0  Trouble concentrating 0 0 0 0 0  Moving slowly or fidgety/restless 0 0 0 0 0  Suicidal thoughts 0 0 0 0 0  PHQ-9 Score 0 0 0 1 0  Difficult doing work/chores Not difficult at all        phq 9 is negative   Fall Risk:    04/03/2023    9:26 AM 04/03/2023    9:17 AM 12/06/2022    9:29 AM 08/07/2022    9:57 AM 05/01/2022   10:17 AM  Fall Risk   Falls in the past year? 0 0 0 0 0  Number falls in past yr:  0   0  Injury with Fall?  0   0  Risk for fall due to : No Fall Risks No Fall Risks No Fall Risks No Fall Risks No Fall Risks  Follow up Falls prevention discussed Falls prevention discussed;Education provided;Falls evaluation completed Falls prevention discussed Falls prevention discussed;Education provided Falls evaluation completed      Functional Status Survey: Does the patient have difficulty  seeing, even when wearing glasses/contacts?: No Does the patient have difficulty concentrating, remembering, or making decisions?: No Does the patient have difficulty walking or climbing stairs?: Yes Does the patient have difficulty dressing or bathing?: No Does the patient have difficulty doing errands alone such as visiting a doctor's office or shopping?: No    Assessment & Plan  1. Type 2 diabetes mellitus with microalbuminuria, without long-term current use of insulin (HCC)  - COMPLETE METABOLIC PANEL WITH GFR - POCT HgB A1C - metFORMIN (GLUCOPHAGE-XR) 750 MG 24 hr tablet; TAKE 2 TABLETS DAILY WITH BREAKFAST  Dispense: 180 tablet; Refill: 1  2. Need for immunization against influenza  He will get it next week at local pharmacy   3. Simple chronic bronchitis (HCC)  stable  4. Hypertension associated with diabetes (HCC)  - valsartan-hydrochlorothiazide (DIOVAN-HCT) 320-25 MG tablet; Take 1 tablet by mouth daily.  Dispense: 90 tablet; Refill: 1 - amLODipine (NORVASC) 5 MG tablet; Take 1 tablet (5 mg total) by mouth every evening.  Dispense: 90 tablet; Refill: 1  5. Meningioma (HCC)  Up to date with MRI and follow ups  6. Senile purpura (HCC)  Reassurance given   7. Atherosclerosis of native coronary artery of native heart with stable angina pectoris (HCC)  Doing well, on statin therapy   8. Claudication of both lower extremities (HCC)  Improving with activity  9. Dyslipidemia associated with type 2 diabetes mellitus (HCC)  - metFORMIN (GLUCOPHAGE-XR) 750 MG 24 hr tablet; TAKE 2 TABLETS DAILY WITH BREAKFAST  Dispense: 180 tablet; Refill: 1  10. Essential hypertension  - amLODipine (NORVASC) 5 MG tablet; Take 1 tablet (5 mg total) by mouth every evening.  Dispense: 90 tablet; Refill: 1  11. Obstructive sleep apnea on CPAP

## 2023-04-03 ENCOUNTER — Encounter: Payer: Self-pay | Admitting: Family Medicine

## 2023-04-03 ENCOUNTER — Other Ambulatory Visit (HOSPITAL_COMMUNITY): Payer: Self-pay

## 2023-04-03 ENCOUNTER — Telehealth: Payer: Self-pay | Admitting: Pharmacy Technician

## 2023-04-03 ENCOUNTER — Encounter: Payer: Self-pay | Admitting: Cardiovascular Disease

## 2023-04-03 ENCOUNTER — Ambulatory Visit (INDEPENDENT_AMBULATORY_CARE_PROVIDER_SITE_OTHER): Payer: Medicare Other | Admitting: Family Medicine

## 2023-04-03 VITALS — BP 130/68 | HR 61 | Temp 98.0°F | Resp 18 | Ht 66.0 in | Wt 204.5 lb

## 2023-04-03 DIAGNOSIS — E1129 Type 2 diabetes mellitus with other diabetic kidney complication: Secondary | ICD-10-CM

## 2023-04-03 DIAGNOSIS — Z23 Encounter for immunization: Secondary | ICD-10-CM | POA: Diagnosis not present

## 2023-04-03 DIAGNOSIS — E785 Hyperlipidemia, unspecified: Secondary | ICD-10-CM | POA: Diagnosis not present

## 2023-04-03 DIAGNOSIS — D329 Benign neoplasm of meninges, unspecified: Secondary | ICD-10-CM

## 2023-04-03 DIAGNOSIS — I739 Peripheral vascular disease, unspecified: Secondary | ICD-10-CM | POA: Diagnosis not present

## 2023-04-03 DIAGNOSIS — I152 Hypertension secondary to endocrine disorders: Secondary | ICD-10-CM | POA: Diagnosis not present

## 2023-04-03 DIAGNOSIS — G4733 Obstructive sleep apnea (adult) (pediatric): Secondary | ICD-10-CM

## 2023-04-03 DIAGNOSIS — E1159 Type 2 diabetes mellitus with other circulatory complications: Secondary | ICD-10-CM

## 2023-04-03 DIAGNOSIS — I25118 Atherosclerotic heart disease of native coronary artery with other forms of angina pectoris: Secondary | ICD-10-CM | POA: Diagnosis not present

## 2023-04-03 DIAGNOSIS — I10 Essential (primary) hypertension: Secondary | ICD-10-CM

## 2023-04-03 DIAGNOSIS — E1169 Type 2 diabetes mellitus with other specified complication: Secondary | ICD-10-CM | POA: Diagnosis not present

## 2023-04-03 DIAGNOSIS — D692 Other nonthrombocytopenic purpura: Secondary | ICD-10-CM | POA: Diagnosis not present

## 2023-04-03 DIAGNOSIS — J410 Simple chronic bronchitis: Secondary | ICD-10-CM

## 2023-04-03 LAB — POCT GLYCOSYLATED HEMOGLOBIN (HGB A1C): Hemoglobin A1C: 6.5 % — AB (ref 4.0–5.6)

## 2023-04-03 MED ORDER — VALSARTAN-HYDROCHLOROTHIAZIDE 320-25 MG PO TABS: 1.0000 | ORAL_TABLET | Freq: Every day | ORAL | 1 refills | Status: DC

## 2023-04-03 MED ORDER — AMLODIPINE BESYLATE 5 MG PO TABS
5.0000 mg | ORAL_TABLET | Freq: Every evening | ORAL | 1 refills | Status: DC
Start: 2023-04-03 — End: 2023-08-20

## 2023-04-03 MED ORDER — METFORMIN HCL ER 750 MG PO TB24: ORAL_TABLET | ORAL | 1 refills | Status: DC

## 2023-04-03 NOTE — Telephone Encounter (Signed)
Left a message for the patient to call back.  

## 2023-04-03 NOTE — Telephone Encounter (Signed)
See My chart message

## 2023-04-03 NOTE — Telephone Encounter (Signed)
Pharmacy Patient Advocate Encounter  Received notification from [facility] that Prior Authorization for doxazosin has been APPROVED from 03/04/23 to 04/02/24. Ran test claim, Copay is $30.00. This test claim was processed through [facility]- copay amounts may vary at other pharmacies due to pharmacy/plan contracts, or as the patient moves through the different stages of their insurance plan.   PA #/Case ID/Reference #: [id number]

## 2023-04-03 NOTE — Telephone Encounter (Signed)
Pharmacy Patient Advocate Encounter   Received notification from Fax that prior authorization for doxazosin is required/requested.   Insurance verification completed.   The patient is insured through [employer] .   Per test claim: PA required; PA submitted to [facility] via CoverMyMeds Key/confirmation #/EOC [id number] Status is pending

## 2023-04-17 DIAGNOSIS — Z23 Encounter for immunization: Secondary | ICD-10-CM | POA: Diagnosis not present

## 2023-04-30 ENCOUNTER — Other Ambulatory Visit: Payer: Self-pay | Admitting: Cardiovascular Disease

## 2023-05-09 ENCOUNTER — Ambulatory Visit: Payer: Medicare Other

## 2023-05-09 VITALS — BP 128/80 | Ht 66.0 in | Wt 209.1 lb

## 2023-05-09 DIAGNOSIS — Z0000 Encounter for general adult medical examination without abnormal findings: Secondary | ICD-10-CM | POA: Diagnosis not present

## 2023-05-09 NOTE — Progress Notes (Signed)
Subjective:   Jason Plumber. is a 82 y.o. male who presents for Medicare Annual/Subsequent preventive examination.  Visit Complete: In person  Patient Medicare AWV questionnaire was completed by the patient on (not done); I have confirmed that all information answered by patient is correct and no changes since this date.  Cardiac Risk Factors include: advanced age (>28men, >55 women);diabetes mellitus;dyslipidemia;hypertension;male gender;obesity (BMI >30kg/m2)    Objective:    Today's Vitals   05/09/23 1008 05/09/23 1016  BP: 128/80   Weight: 209 lb 1.6 oz (94.8 kg)   Height: 5\' 6"  (1.676 m)   PainSc:  0-No pain   Body mass index is 33.75 kg/m.     05/09/2023   10:24 AM 04/25/2021   10:53 AM 04/21/2020   10:44 AM 04/17/2019   10:25 AM 09/26/2018    3:04 PM 09/23/2018    9:17 AM 09/04/2018    6:20 PM  Advanced Directives  Does Patient Have a Medical Advance Directive? No No No No No No No  Would patient like information on creating a medical advance directive?  No - Patient declined No - Patient declined No - Patient declined  No - Patient declined No - Patient declined    Current Medications (verified) Outpatient Encounter Medications as of 05/09/2023  Medication Sig   acetaminophen (TYLENOL) 325 MG tablet Take 1-2 tablets (325-650 mg total) by mouth every 4 (four) hours as needed for mild pain.   amiodarone (PACERONE) 200 MG tablet TAKE 1 TABLET DAILY   amLODipine (NORVASC) 5 MG tablet Take 1 tablet (5 mg total) by mouth every evening.   aspirin 81 MG tablet Take 81 mg by mouth daily.   atorvastatin (LIPITOR) 40 MG tablet TAKE 1 TABLET DAILY   Cholecalciferol (VITAMIN D) 50 MCG (2000 UT) tablet Take 2,000 Units by mouth daily.   docusate sodium (COLACE) 100 MG capsule Take 100 mg by mouth 2 (two) times daily. Pt takes every other day   doxazosin (CARDURA) 1 MG tablet Take 1 tablet (1 mg total) by mouth 2 (two) times daily.   ferrous sulfate 325 (65 FE) MG tablet  Take 1 tablet (325 mg total) by mouth 2 (two) times daily with a meal. Add colace 100 mg (Patient taking differently: Take 325 mg by mouth daily with breakfast.)   fluorouracil (EFUDEX) 5 % cream Apply topically.   glipiZIDE (GLUCOTROL XL) 2.5 MG 24 hr tablet TAKE 1 TABLET DAILY WITH BREAKFAST   glucose blood test strip Use as instructed   ipratropium (ATROVENT) 0.03 % nasal spray Place 2 sprays into both nostrils every 12 (twelve) hours.   KLOR-CON M20 20 MEQ tablet TAKE 1 TABLET DAILY   magnesium oxide (MAG-OX) 400 (241.3 Mg) MG tablet Take 1 tablet (400 mg total) by mouth 2 (two) times daily.   metFORMIN (GLUCOPHAGE-XR) 750 MG 24 hr tablet TAKE 2 TABLETS DAILY WITH BREAKFAST   metoprolol succinate (TOPROL-XL) 25 MG 24 hr tablet TAKE 1 TABLET EVERY EVENING   Multiple Vitamins-Minerals (CENTRUM SILVER PO) Take 1 tablet by mouth daily.    valsartan-hydrochlorothiazide (DIOVAN-HCT) 320-25 MG tablet Take 1 tablet by mouth daily.   No facility-administered encounter medications on file as of 05/09/2023.    Allergies (verified) Patient has no known allergies.   History: Past Medical History:  Diagnosis Date   Coronary artery disease    Diabetes mellitus without complication (HCC)    Dyslipidemia    GERD (gastroesophageal reflux disease)    Hyperlipidemia    Hypertension  MI (myocardial infarction) [facility])    Osteoarthritis    Past Surgical History:  Procedure Laterality Date   CARDIAC CATHETERIZATION  2006   stent placement   COLONOSCOPY     CORONARY ANGIOPLASTY WITH STENT PLACEMENT  2006   stent x 2    CORONARY ANGIOPLASTY WITH STENT PLACEMENT  1996   stent x 3    CRANIOTOMY  08/12/2018   FOOT SURGERY Right    HALLUX VALGUS CHEILECTOMY Right 02/25/2015   Procedure: HALLUX VALGUS CHEILECTOMY;  Surgeon: Recardo Evangelist, DPM;  Location: [facility] ORS;  Service: Podiatry;  Laterality: Right;   Family History  Problem Relation Age of Onset   Hyperlipidemia Father    Hypertension  Father    Alcohol abuse Father    Cancer Sister        lung   Hypertension Sister    Heart disease Sister    Hyperlipidemia Sister    COPD Sister    Cancer Sister        unknown   Social History   Socioeconomic History   Marital status: Married    Spouse name: Darl Pikes   Number of children: 4   Years of education: some college   Highest education level: 12th grade  Occupational History   Occupation: Retired  Tobacco Use   Smoking status: Former    Current packs/day: 0.00    Average packs/day: 1.5 packs/day for 35.1 years (52.7 ttl pk-yrs)    Types: Cigarettes    Start date: 07/17/1959    Quit date: 08/27/1994    Years since quitting: 28.7   Smokeless tobacco: Never   Tobacco comments:    smoking cessation materials not required  Vaping Use   Vaping status: Never Used  Substance and Sexual Activity   Alcohol use: Not Currently    Alcohol/week: 0.0 standard drinks of alcohol   Drug use: No   Sexual activity: Not Currently    Partners: Female  Other Topics Concern   Not on file  Social History Narrative   Not on file   Social Determinants of Health   Financial Resource Strain: Low Risk  (05/09/2023)   Overall Financial Resource Strain (CARDIA)    Difficulty of Paying Living Expenses: Not very hard  Food Insecurity: No Food Insecurity (05/09/2023)   Hunger Vital Sign    Worried About Running Out of Food in the Last Year: Never true    Ran Out of Food in the Last Year: Never true  Transportation Needs: No Transportation Needs (05/09/2023)   PRAPARE - Administrator, Civil Service (Medical): No    Lack of Transportation (Non-Medical): No  Physical Activity: Insufficiently Active (05/09/2023)   Exercise Vital Sign    Days of Exercise per Week: 3 days    Minutes of Exercise per Session: 30 min  Stress: No Stress Concern Present (05/09/2023)   [employer] of Occupational Health - Occupational Stress Questionnaire    Feeling of Stress : Not at all   Social Connections: Moderately Isolated (05/09/2023)   Social Connection and Isolation Panel [NHANES]    Frequency of Communication with Friends and Family: Twice a week    Frequency of Social Gatherings with Friends and Family: Once a week    Attends Religious Services: Never    Database administrator or Organizations: No    Attends Banker Meetings: Never    Marital Status: Married    Tobacco Counseling Counseling given: Not Answered Tobacco comments: smoking cessation materials not  required   Clinical Intake:  Pre-visit preparation completed: No  Pain : No/denies pain Pain Score: 0-No pain   BMI - recorded: 33.75 Nutritional Status: BMI > 30  Obese Nutritional Risks: None Diabetes: Yes CBG done?: Yes (BS 109 this am at home) CBG resulted in Enter/ Edit results?: No Did pt. bring in CBG monitor from home?: No  How often do you need to have someone help you when you read instructions, pamphlets, or other written materials from your doctor or pharmacy?: 1 - Never  Interpreter Needed?: No  Comments: lives with wife Information entered by :: B.Boordeaux,LPN   Activities of Daily Living    05/09/2023   10:24 AM 04/03/2023    9:17 AM  In your present state of health, do you have any difficulty performing the following activities:  Hearing? 1   Vision? 0 0  Difficulty concentrating or making decisions? 0 0  Walking or climbing stairs? 1 1  Dressing or bathing? 0 0  Doing errands, shopping? 0 0  Preparing Food and eating ? N   Using the Toilet? N   In the past six months, have you accidently leaked urine? N   Do you have problems with loss of bowel control? N   Managing your Medications? N   Managing your Finances? N   Housekeeping or managing your Housekeeping? N     Patient Care Team: Alba Cory, MD as PCP - General (Family Medicine) Antonieta Iba, MD as Consulting Physician (Cardiology) Lucy Chris, MD as Consulting Physician  (Neurosurgery)  Indicate any recent Medical Services you may have received from other than [facility] providers in the past year (date may be approximate).     Assessment:   This is a routine wellness examination for Jason Farrell.  Hearing/Vision screen Hearing Screening - Comments:: Pt says he needs to get more hearing aids;decreased hearing Vision Screening - Comments:: Pt says he hears good [facility]   Goals Addressed             This Visit's Progress    COMPLETED: DIET - INCREASE WATER INTAKE   On track    Recommend to drink at least 6-8 8oz glasses of water per day.       Depression Screen    05/09/2023   10:21 AM 04/03/2023    9:17 AM 12/06/2022    9:29 AM 08/07/2022    9:58 AM 05/01/2022   10:15 AM 04/06/2022   10:56 AM 12/06/2021   10:46 AM  PHQ 2/9 Scores  PHQ - 2 Score 0 0 0 0 0 0 0  PHQ- 9 Score  0 0 0 1 0 0    Fall Risk    05/09/2023   10:18 AM 04/03/2023    9:26 AM 04/03/2023    9:17 AM 12/06/2022    9:29 AM 08/07/2022    9:57 AM  Fall Risk   Falls in the past year? 0 0 0 0 0  Number falls in past yr: 0  0    Injury with Fall? 0  0    Risk for fall due to : No Fall Risks No Fall Risks No Fall Risks No Fall Risks No Fall Risks  Follow up Education provided;Falls prevention discussed Falls prevention discussed Falls prevention discussed;Education provided;Falls evaluation completed Falls prevention discussed Falls prevention discussed;Education provided    MEDICARE RISK AT HOME: Medicare Risk at Home Any stairs in or around the home?: Yes If so, are there any without handrails?: Yes Home free  of loose throw rugs in walkways, pet beds, electrical cords, etc?: Yes Adequate lighting in your home to reduce risk of falls?: Yes Life alert?: No Use of a cane, walker or w/c?: No Grab bars in the bathroom?: Yes Shower chair or bench in shower?: Yes Elevated toilet seat or a handicapped toilet?: No  TIMED UP AND GO:  Was the test performed?  Yes  Length of time to  ambulate 10 feet: 12 sec Gait steady and fast without use of assistive device    Cognitive Function:        05/09/2023   10:26 AM 05/01/2022   10:19 AM 04/21/2020   10:32 AM 04/17/2019   10:28 AM 01/23/2018   10:50 AM  6CIT Screen  What Year? 0 points 0 points 0 points 0 points 0 points  What month? 0 points 0 points 0 points 0 points 0 points  What time? 0 points 0 points 0 points 0 points 0 points  Count back from 20 0 points 0 points 0 points 0 points 0 points  Months in reverse 0 points 0 points 0 points 0 points 2 points  Repeat phrase 0 points 4 points 0 points 0 points 2 points  Total Score 0 points 4 points 0 points 0 points 4 points    Immunizations Immunization History  Administered Date(s) Administered   Fluad Quad(high Dose 65+) 04/02/2019   Influenza, High Dose Seasonal PF 03/19/2018, 03/31/2021, 04/03/2022   Influenza, Seasonal, Injecte, Preservative Fre 05/10/2014   Influenza-Unspecified 04/15/2015, 03/21/2016, 04/02/2020   Moderna Covid-19 Vaccine Bivalent Booster 52yrs & up 09/11/2021   PFIZER(Purple Top)SARS-COV-2 Vaccination 09/05/2019, 09/29/2019, 05/10/2020, 03/14/2021   Pfizer Covid-19 Vaccine Bivalent Booster 44yrs & up 04/03/2022   Pneumococcal Conjugate-13 07/10/2016   Pneumococcal Polysaccharide-23 07/22/2009   Tdap 12/06/2020   Zoster Recombinant(Shingrix) 09/11/2021, 01/08/2023    TDAP status: Up to date  Flu Vaccine status: Up to date  Pneumococcal vaccine status: Up to date  Covid-19 vaccine status: Completed vaccines  Qualifies for Shingles Vaccine? Yes   Zostavax completed Yes   Shingrix Completed?: Yes  Screening Tests Health Maintenance  Topic Date Due   COVID-19 Vaccine (7 - 2023-24 season) 03/17/2023   Diabetic kidney evaluation - eGFR measurement  04/07/2023   INFLUENZA VACCINE  10/14/2023 (Originally 02/14/2023)   Diabetic kidney evaluation - Urine ACR  08/08/2023   HEMOGLOBIN A1C  10/01/2023   OPHTHALMOLOGY EXAM  10/03/2023    FOOT EXAM  12/06/2023   Medicare Annual Wellness (AWV)  05/08/2024   DTaP/Tdap/Td (2 - Td or Tdap) 12/07/2030   Pneumonia Vaccine 28+ Years old  Completed   Zoster Vaccines- Shingrix  Completed   HPV VACCINES  Aged Out   Colonoscopy  Discontinued   Hepatitis C Screening  Discontinued    Health Maintenance  Health Maintenance Due  Topic Date Due   COVID-19 Vaccine (7 - 2023-24 season) 03/17/2023   Diabetic kidney evaluation - eGFR measurement  04/07/2023    Colorectal cancer screening: No longer required.   Lung Cancer Screening: (Low Dose CT Chest recommended if Age 85-80 years, 20 pack-year currently smoking OR have quit w/in 15years.) does not qualify.   Lung Cancer Screening Referral: no  Additional Screening:  Hepatitis C Screening: does not qualify; Completed 04/04/20  Vision Screening: Recommended annual ophthalmology exams for early detection of glaucoma and other disorders of the eye. Is the patient up to date with their annual eye exam?  Yes  Who is the provider or what is the  name of the office in which the patient attends annual eye exams? 10/03/22 If pt is not established with a provider, would they like to be referred to a provider to establish care? No .   Dental Screening: Recommended annual dental exams for proper oral hygiene  Diabetic Foot Exam: Diabetic Foot Exam: Completed 12/06/22  Community Resource Referral / Chronic Care Management: CRR required this visit?  No   CCM required this visit?  No    Plan:    I have personally reviewed and noted the following in the patient's chart:   Medical and social history Use of alcohol, tobacco or illicit drugs  Current medications and supplements including opioid prescriptions. Patient is not currently taking opioid prescriptions. Functional ability and status Nutritional status Physical activity Advanced directives List of other physicians Hospitalizations, surgeries, and ER visits in previous 12  months Vitals Screenings to include cognitive, depression, and falls Referrals and appointments  In addition, I have reviewed and discussed with patient certain preventive protocols, quality metrics, and best practice recommendations. A written personalized care plan for preventive services as well as general preventive health recommendations were provided to patient.    Sue Lush, LPN   [id number]   After Visit Summary: (MyChart) Due to this being a telephonic visit, the after visit summary with patients personalized plan was offered to patient via MyChart   Nurse Notes: The patient states he is doing well and has no concerns or questions at this time.

## 2023-05-09 NOTE — Patient Instructions (Signed)
Jason Farrell , Thank you for taking time to come for your Medicare Wellness Visit. I appreciate your ongoing commitment to your health goals. Please review the following plan we discussed and let me know if I can assist you in the future.   Referrals/Orders/Follow-Ups/Clinician Recommendations: none  This is a list of the screening recommended for you and due dates:  Health Maintenance  Topic Date Due   COVID-19 Vaccine (7 - 2023-24 season) 03/17/2023   Yearly kidney function blood test for diabetes  04/07/2023   Flu Shot  10/14/2023*   Yearly kidney health urinalysis for diabetes  08/08/2023   Hemoglobin A1C  10/01/2023   Eye exam for diabetics  10/03/2023   Complete foot exam   12/06/2023   Medicare Annual Wellness Visit  05/08/2024   DTaP/Tdap/Td vaccine (2 - Td or Tdap) 12/07/2030   Pneumonia Vaccine  Completed   Zoster (Shingles) Vaccine  Completed   HPV Vaccine  Aged Out   Colon Cancer Screening  Discontinued   Hepatitis C Screening  Discontinued  *Topic was postponed. The date shown is not the original due date.    Advanced directives: (Declined) Advance directive discussed with you today. Even though you declined this today, please call our office should you change your mind, and we can give you the proper paperwork for you to fill out.  Next Medicare Annual Wellness Visit scheduled for next year: Yes  05/14/24 @ 10:15am in person

## 2023-05-15 NOTE — Progress Notes (Unsigned)
Name: Lenon Oms.   MRN: [id number]    DOB: January 13, 1941   Date:05/16/2023       Progress Note  Subjective  Chief Complaint  Follow Up  HPI  HTN/SVT:  He is on Amiodarone  and Toprol XL 25 mg daily for tachycardia . He is also taking Norvasc 5 mg and Valsartan hctz 320/25, in September Dr. Vaughan Basta added Cardura 1 mg BID due to high bp during office visit. Patient has been monitoring his bp at home and it has been 120's/40's-50's . He denies orthostatic changes. Reached out to Dr. Mariah Milling and we will decrease Cardura to once at night due to low DBP and his advanced age.   Ear fullness: left side worse than right side, going on for weeks.   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    Status post craniotomy 08/12/2018   Senile purpura (HCC) 02/21/2018   Type 2 diabetes mellitus with microalbuminuria, without long-term current use of insulin (HCC) 02/21/2018   Osteoarthritis of hip 11/04/2017   Obstructive sleep apnea on CPAP 12/09/2015   OA (osteoarthritis) of knee 05/06/2015   Gastroesophageal reflux disease without esophagitis 05/06/2015   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) 01/25/2015   Hyperlipidemia 08/16/2014   Essential hypertension 08/16/2014   Stopped smoking with greater than 30 pack year history 08/16/2014   COLD (chronic obstructive lung disease) (HCC) 08/16/2014    Past Surgical History:  Procedure Laterality Date   CARDIAC CATHETERIZATION  2006   stent placement   COLONOSCOPY     CORONARY ANGIOPLASTY WITH STENT PLACEMENT  2006   stent x 2    CORONARY ANGIOPLASTY WITH STENT PLACEMENT  1996   stent x 3     CRANIOTOMY  08/12/2018   FOOT SURGERY Right    HALLUX VALGUS CHEILECTOMY Right 02/25/2015   Procedure: HALLUX VALGUS CHEILECTOMY;  Surgeon: Recardo Evangelist, DPM;  Location: [facility] ORS;  Service: Podiatry;  Laterality: Right;    Family History  Problem Relation Age of Onset   Hyperlipidemia Father    Hypertension Father    Alcohol abuse Father    Cancer Sister        lung   Hypertension Sister    Heart disease Sister    Hyperlipidemia Sister    COPD Sister    Cancer Sister        unknown    Social History   Tobacco Use   Smoking status: Former    Current packs/day: 0.00    Average packs/day: 1.5 packs/day for 35.1 years (52.7 ttl pk-yrs)    Types: Cigarettes    Start date: 07/17/1959    Quit date: 08/27/1994    Years since quitting: 28.7   Smokeless tobacco: Never   Tobacco comments:    smoking cessation materials not required  Substance Use Topics   Alcohol use: Not Currently    Alcohol/week: 0.0 standard drinks of alcohol     Current Outpatient Medications:    acetaminophen (TYLENOL) 325 MG tablet, Take 1-2 tablets (325-650 mg total) by mouth every 4 (four) hours as needed for mild pain., Disp: , Rfl:    amiodarone (PACERONE) 200 MG tablet, TAKE 1 TABLET DAILY,  Disp: 90 tablet, Rfl: 2   amLODipine (NORVASC) 5 MG tablet, Take 1 tablet (5 mg total) by mouth every evening., Disp: 90 tablet, Rfl: 1   aspirin 81 MG tablet, Take 81 mg by mouth daily., Disp: , Rfl:    atorvastatin (LIPITOR) 40 MG tablet, TAKE 1 TABLET DAILY, Disp: 90 tablet, Rfl: 3   Cholecalciferol (VITAMIN D) 50 MCG (2000 UT) tablet, Take 2,000 Units by mouth daily., Disp: , Rfl:    docusate sodium (COLACE) 100 MG capsule, Take 100 mg by mouth 2 (two) times daily. Pt takes every other day, Disp: , Rfl:    doxazosin (CARDURA) 1 MG tablet, Take 1 tablet (1 mg total) by mouth 2 (two) times daily., Disp: 180 tablet, Rfl: 3   ferrous sulfate 325 (65 FE) MG tablet, Take 1 tablet (325 mg total) by mouth 2 (two) times  daily with a meal. Add colace 100 mg (Patient taking differently: Take 325 mg by mouth daily with breakfast.), Disp: 180 tablet, Rfl: 1   fluorouracil (EFUDEX) 5 % cream, Apply topically., Disp: , Rfl:    glipiZIDE (GLUCOTROL XL) 2.5 MG 24 hr tablet, TAKE 1 TABLET DAILY WITH BREAKFAST, Disp: 90 tablet, Rfl: 3   glucose blood test strip, Use as instructed, Disp: 100 each, Rfl: 12   ipratropium (ATROVENT) 0.03 % nasal spray, Place 2 sprays into both nostrils every 12 (twelve) hours., Disp: 90 mL, Rfl: 1   KLOR-CON M20 20 MEQ tablet, TAKE 1 TABLET DAILY, Disp: 90 tablet, Rfl: 3   magnesium oxide (MAG-OX) 400 (241.3 Mg) MG tablet, Take 1 tablet (400 mg total) by mouth 2 (two) times daily., Disp: 60 tablet, Rfl: 0   metFORMIN (GLUCOPHAGE-XR) 750 MG 24 hr tablet, TAKE 2 TABLETS DAILY WITH BREAKFAST, Disp: 180 tablet, Rfl: 1   metoprolol succinate (TOPROL-XL) 25 MG 24 hr tablet, TAKE 1 TABLET EVERY EVENING, Disp: 90 tablet, Rfl: 3   Multiple Vitamins-Minerals (CENTRUM SILVER PO), Take 1 tablet by mouth daily. , Disp: , Rfl:    valsartan-hydrochlorothiazide (DIOVAN-HCT) 320-25 MG tablet, Take 1 tablet by mouth daily., Disp: 90 tablet, Rfl: 1  No Known Allergies  I personally reviewed active problem list, medication list, allergies, family history, social history, health maintenance with the patient/caregiver today.   ROS  Ten systems reviewed and is negative except as mentioned in HPI    Objective  Vitals:   05/16/23 1053  BP: (!) 122/40  Pulse: (!) 58  Resp: 16  Temp: 97.7 F (36.5 C)  TempSrc: Oral  SpO2: 98%  Weight: 208 lb 8 oz (94.6 kg)  Height: 5\' 6"  (1.676 m)    Body mass index is 33.65 kg/m.  Physical Exam  Constitutional: Patient appears well-developed and well-nourished. Obese  No distress.  HEENT: head atraumatic, normocephalic, pupils equal and reactive to light, ears cerumen impaction bilaterally , neck supple, throat within normal limits Cardiovascular: Normal rate,  regular rhythm and normal heart sounds.  No murmur heard. No BLE edema. Pulmonary/Chest: Effort normal and breath sounds normal. No respiratory distress. Abdominal: Soft.  There is no tenderness. Psychiatric: Patient has a normal mood and affect. behavior is normal. Judgment and thought content normal.    PHQ2/9:    05/09/2023   10:21 AM 04/03/2023    9:17 AM 12/06/2022    9:29 AM 08/07/2022    9:58 AM 05/01/2022   10:15 AM  Depression screen PHQ 2/9  Decreased Interest 0 0 0 0 0  Down, Depressed, Hopeless 0 0 0  0 0  PHQ - 2 Score 0 0 0 0 0  Altered sleeping  0 0 0 0  Tired, decreased energy  0 0 0 1  Change in appetite  0 0 0 0  Feeling bad or failure about yourself   0 0 0 0  Trouble concentrating  0 0 0 0  Moving slowly or fidgety/restless  0 0 0 0  Suicidal thoughts  0 0 0 0  PHQ-9 Score  0 0 0 1  Difficult doing work/chores  Not difficult at all       phq 9 is negative   Fall Risk:    05/16/2023   10:54 AM 05/09/2023   10:18 AM 04/03/2023    9:26 AM 04/03/2023    9:17 AM 12/06/2022    9:29 AM  Fall Risk   Falls in the past year? 0 0 0 0 0  Number falls in past yr:  0  0   Injury with Fall?  0  0   Risk for fall due to : No Fall Risks No Fall Risks No Fall Risks No Fall Risks No Fall Risks  Follow up Falls prevention discussed Education provided;Falls prevention discussed Falls prevention discussed Falls prevention discussed;Education provided;Falls evaluation completed Falls prevention discussed      Functional Status Survey: Is the patient deaf or have difficulty hearing?: Yes Does the patient have difficulty seeing, even when wearing glasses/contacts?: No Does the patient have difficulty concentrating, remembering, or making decisions?: No Does the patient have difficulty walking or climbing stairs?: Yes Does the patient have difficulty dressing or bathing?: No Does the patient have difficulty doing errands alone such as visiting a doctor's office or shopping?:  No    Assessment & Plan  1. Essential hypertension  Discussed with Dr. Mariah Milling, we will decrease Cardura to once at night and monitor   - doxazosin (CARDURA) 1 MG tablet; Take 1 tablet (1 mg total) by mouth every evening.  Dispense: 90 tablet; Refill: 0  2. Bilateral impacted cerumen  - Ear Lavage   Verbal consent given Possible side effects discussed with patient Ears were  lavaged with warm water and peroxide  Patient tolerated procedure well No complications

## 2023-05-16 ENCOUNTER — Ambulatory Visit: Payer: Medicare Other | Admitting: Family Medicine

## 2023-05-16 ENCOUNTER — Encounter: Payer: Self-pay | Admitting: Family Medicine

## 2023-05-16 VITALS — BP 122/40 | HR 58 | Temp 97.7°F | Resp 16 | Ht 66.0 in | Wt 208.5 lb

## 2023-05-16 DIAGNOSIS — Z23 Encounter for immunization: Secondary | ICD-10-CM

## 2023-05-16 DIAGNOSIS — I10 Essential (primary) hypertension: Secondary | ICD-10-CM

## 2023-05-16 DIAGNOSIS — H6123 Impacted cerumen, bilateral: Secondary | ICD-10-CM

## 2023-05-16 MED ORDER — DOXAZOSIN MESYLATE 1 MG PO TABS
1.0000 mg | ORAL_TABLET | Freq: Every evening | ORAL | 0 refills | Status: DC
Start: 2023-05-16 — End: 2023-08-20

## 2023-07-30 DIAGNOSIS — L570 Actinic keratosis: Secondary | ICD-10-CM | POA: Diagnosis not present

## 2023-07-30 DIAGNOSIS — D2262 Melanocytic nevi of left upper limb, including shoulder: Secondary | ICD-10-CM | POA: Diagnosis not present

## 2023-07-30 DIAGNOSIS — D485 Neoplasm of uncertain behavior of skin: Secondary | ICD-10-CM | POA: Diagnosis not present

## 2023-07-30 DIAGNOSIS — Z85828 Personal history of other malignant neoplasm of skin: Secondary | ICD-10-CM | POA: Diagnosis not present

## 2023-07-30 DIAGNOSIS — D2271 Melanocytic nevi of right lower limb, including hip: Secondary | ICD-10-CM | POA: Diagnosis not present

## 2023-07-30 DIAGNOSIS — D492 Neoplasm of unspecified behavior of bone, soft tissue, and skin: Secondary | ICD-10-CM | POA: Diagnosis not present

## 2023-07-30 DIAGNOSIS — D2261 Melanocytic nevi of right upper limb, including shoulder: Secondary | ICD-10-CM | POA: Diagnosis not present

## 2023-07-30 DIAGNOSIS — D2272 Melanocytic nevi of left lower limb, including hip: Secondary | ICD-10-CM | POA: Diagnosis not present

## 2023-08-20 ENCOUNTER — Ambulatory Visit (INDEPENDENT_AMBULATORY_CARE_PROVIDER_SITE_OTHER): Payer: Managed Care, Other (non HMO) | Admitting: Family Medicine

## 2023-08-20 ENCOUNTER — Encounter: Payer: Self-pay | Admitting: Family Medicine

## 2023-08-20 VITALS — BP 142/70 | HR 64 | Temp 98.0°F | Resp 16 | Ht 66.0 in | Wt 213.0 lb

## 2023-08-20 DIAGNOSIS — I739 Peripheral vascular disease, unspecified: Secondary | ICD-10-CM | POA: Diagnosis not present

## 2023-08-20 DIAGNOSIS — J410 Simple chronic bronchitis: Secondary | ICD-10-CM | POA: Diagnosis not present

## 2023-08-20 DIAGNOSIS — I471 Supraventricular tachycardia, unspecified: Secondary | ICD-10-CM | POA: Diagnosis not present

## 2023-08-20 DIAGNOSIS — G4733 Obstructive sleep apnea (adult) (pediatric): Secondary | ICD-10-CM | POA: Diagnosis not present

## 2023-08-20 DIAGNOSIS — I25118 Atherosclerotic heart disease of native coronary artery with other forms of angina pectoris: Secondary | ICD-10-CM

## 2023-08-20 DIAGNOSIS — E1129 Type 2 diabetes mellitus with other diabetic kidney complication: Secondary | ICD-10-CM | POA: Diagnosis not present

## 2023-08-20 DIAGNOSIS — E1159 Type 2 diabetes mellitus with other circulatory complications: Secondary | ICD-10-CM | POA: Diagnosis not present

## 2023-08-20 DIAGNOSIS — D329 Benign neoplasm of meninges, unspecified: Secondary | ICD-10-CM

## 2023-08-20 DIAGNOSIS — D692 Other nonthrombocytopenic purpura: Secondary | ICD-10-CM | POA: Diagnosis not present

## 2023-08-20 DIAGNOSIS — E1169 Type 2 diabetes mellitus with other specified complication: Secondary | ICD-10-CM | POA: Diagnosis not present

## 2023-08-20 DIAGNOSIS — I152 Hypertension secondary to endocrine disorders: Secondary | ICD-10-CM

## 2023-08-20 LAB — POCT GLYCOSYLATED HEMOGLOBIN (HGB A1C): Hemoglobin A1C: 6.5 % — AB (ref 4.0–5.6)

## 2023-08-20 MED ORDER — AMLODIPINE BESYLATE 5 MG PO TABS: 5.0000 mg | ORAL_TABLET | Freq: Every evening | ORAL | 1 refills | Status: DC

## 2023-08-20 MED ORDER — VALSARTAN-HYDROCHLOROTHIAZIDE 320-25 MG PO TABS: 1.0000 | ORAL_TABLET | Freq: Every day | ORAL | 1 refills | Status: DC

## 2023-08-20 MED ORDER — DOXAZOSIN MESYLATE 1 MG PO TABS: 1.0000 mg | ORAL_TABLET | Freq: Every evening | ORAL | 1 refills | Status: DC

## 2023-08-20 MED ORDER — ATORVASTATIN CALCIUM 40 MG PO TABS: 40.0000 mg | ORAL_TABLET | Freq: Every day | ORAL | 1 refills | Status: DC

## 2023-08-20 NOTE — Progress Notes (Signed)
 Name: Jason Farrell.   MRN: [id number]    DOB: [birth year]   Date:08/20/2023       Progress Note  Subjective  Chief Complaint  Chief Complaint  Patient presents with   Medical Management of Chronic Issues   HPI   HTN/SVT:  He is on Amiodarone   and Toprol  XL 25 mg daily for tachycardia  He is also taking Norvasc  5 mg and Valsartan  hctz 320/25, in September 2024 Dr. Goland added Cardura  1 mg BID due to high bp during office visit., however bp dropped at home and he was seen in October in our office, we contacted Dr. Gollan and reduced cardura  to at bedtime , bp at home has been in the low 130's at home but he feels well on current dose , he states he will increase his physical activity    Meningioma: he had a partial resection done at [facility] by Dr. Bluford in 2020, he states he has been doing well. Denies memory changes,  confusion, states no balanced problems, he denies headaches, no blurred or double vision. He had a repeat  MRI in Feb 2024  and saw Dr. Maree in 01/2023 and is scheduled to follow up in 2 years    MRI brain 07/22 1. 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 2. Stable appearance of small left frontal convexity meningioma. 3. Mildly enlarged left thyroid  gland heterogeneously enhancing lesion, now measuring approximately 2.5 cm.   IMPRESSION:08/2022    1. 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 2. Unchanged 1.3 cm left frontal convexity meningioma. 3. Mild enlargement of a 2.8 cm left parotid mass.     DMII: he was diagnosed many years ago, he is taking Metformin  and Glipizide , glucose at home has been well controlled around 110 fasting .He denies polyphagia or polydipsia.A1C has been steady at 6.5 % . He has associated dyslipidemia, HTN and obesity . He is on ARB, statin therapy and denies side effects of medications. He denies hypoglycemic episodes    Claudication: he states he  has aching on both legs when he walks for about 20 minutes , thigh and lower leg, unchanged . On statin therapy and also takes aspirin  daily . He has not been walking lately, needs to resume regular physical activity . Discussed referral to vascular surgeon but he would like to hold off    Obesity: he states he has been eating more than usual and weight is trending up, he states usually gains weight during cold months. Needs to resume physical activity and healthy diet    Chronic bronchitis  with history of tobacco use: he denies daily cough, mild SOB with moderate activity, denies wheezing.  Quit smoking in 1996, after MI. He is not interested in taking inhalers    S/p MI and and history of stent placement/CAD with stable angina : sees Dr. Gollan  last visit July 2022, no recent episodes of chest pain or palpitation,he has been able to take care of his own yard for hours . He is taking Atorvastatin  40 mg and last LDL was at goal  40 . 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 He is asymptomatic . Unchanged    Hearing loss: he recently got an otc hearing aid and is doing well . He is wearing his aid today  History of iron deficiency anemia: diagnosed 2021, hemoccult stools negative times 3, he has been taking iron supplementation, denies pica, last level was normal .  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    Status post craniotomy 08/12/2018   Senile purpura (HCC) 02/21/2018   Type 2 diabetes mellitus with microalbuminuria, without long-term current use of insulin  (HCC) 02/21/2018   Osteoarthritis of hip 11/04/2017   Obstructive sleep apnea on CPAP 12/09/2015   OA (osteoarthritis) of knee 05/06/2015   Gastroesophageal reflux disease without esophagitis 05/06/2015   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) 01/25/2015   Hyperlipidemia 08/16/2014   Essential hypertension 08/16/2014   Stopped smoking with greater than 30 pack year history 08/16/2014   COLD (chronic obstructive lung disease) (HCC) 08/16/2014    Past Surgical History:  Procedure Laterality Date   CARDIAC CATHETERIZATION  2006   stent placement   COLONOSCOPY     CORONARY ANGIOPLASTY WITH STENT PLACEMENT  2006   stent x 2    CORONARY ANGIOPLASTY WITH STENT PLACEMENT  1996   stent x 3    CRANIOTOMY  08/12/2018   FOOT SURGERY Right    HALLUX VALGUS CHEILECTOMY Right 02/25/2015   Procedure: HALLUX VALGUS CHEILECTOMY;  Surgeon: [name], DPM;  Location: [facility] OR[facility];  Service: Podiatry;  Laterality: Right;    Family History  Problem Relation Age of Onset   Hyperlipidemia Father    Hypertension Father    Alcohol abuse Father    Cancer Sister        lung   Hypertension Sister    Heart disease Sister    Hyperlipidemia Sister    COPD Sister    Cancer Sister        unknown    Social History   Tobacco Use   Smoking status: Former    Current packs/day: 0.00    Average packs/day: 1.5 packs/day for 35.1 years (52.7 ttl pk-yrs)    Types: Cigarettes    Start  date: 07/17/1959    Quit date: 08/27/1994    Years since quitting: 29.0   Smokeless tobacco: Never   Tobacco comments:    smoking cessation materials not required  Substance Use Topics   Alcohol use: Not Currently    Alcohol/week: 0.0 standard drinks of alcohol     Current Outpatient Medications:    acetaminophen  (TYLENOL ) 325 MG tablet, Take 1-2 tablets (325-650 mg total) by mouth every 4 (four) hours as needed for mild pain., Disp: , Rfl:    amiodarone  (PACERONE ) 200 MG tablet, TAKE 1 TABLET DAILY, Disp: 90 tablet,  Rfl: 2   amLODipine  (NORVASC ) 5 MG tablet, Take 1 tablet (5 mg total) by mouth every evening., Disp: 90 tablet, Rfl: 1   aspirin  81 MG tablet, Take 81 mg by mouth daily., Disp: , Rfl:    atorvastatin  (LIPITOR) 40 MG tablet, TAKE 1 TABLET DAILY, Disp: 90 tablet, Rfl: 3   Cholecalciferol (VITAMIN D ) 50 MCG (2000 UT) tablet, Take 2,000 Units by mouth daily., Disp: , Rfl:    docusate sodium  (COLACE) 100 MG capsule, Take 100 mg by mouth 2 (two) times daily. Pt takes every other day, Disp: , Rfl:    doxazosin  (CARDURA ) 1 MG tablet, Take 1 tablet (1 mg total) by mouth every evening., Disp: 90 tablet, Rfl: 0   ferrous sulfate  325 (65 FE) MG tablet, Take 1 tablet (325 mg total) by mouth 2 (two) times daily with a meal. Add colace 100 mg (Patient taking differently: Take 325 mg by mouth daily with breakfast.), Disp: 180 tablet, Rfl: 1   glipiZIDE  (GLUCOTROL  XL) 2.5 MG 24 hr tablet, TAKE 1 TABLET DAILY WITH BREAKFAST, Disp: 90 tablet, Rfl: 3   glucose blood test strip, Use as instructed, Disp: 100 each, Rfl: 12   magnesium  oxide (MAG-OX) 400 (241.3 Mg) MG tablet, Take 1 tablet (400 mg total) by mouth 2 (two) times daily., Disp: 60 tablet, Rfl: 0   metFORMIN  (GLUCOPHAGE -XR) 750 MG 24 hr tablet, TAKE 2 TABLETS DAILY WITH BREAKFAST, Disp: 180 tablet, Rfl: 1   metoprolol  succinate (TOPROL -XL) 25 MG 24 hr tablet, TAKE 1 TABLET EVERY EVENING, Disp: 90 tablet, Rfl: 3   Multiple Vitamins-Minerals  (CENTRUM SILVER PO), Take 1 tablet by mouth daily. , Disp: , Rfl:    valsartan -hydrochlorothiazide  (DIOVAN -HCT) 320-25 MG tablet, Take 1 tablet by mouth daily., Disp: 90 tablet, Rfl: 1   ipratropium (ATROVENT ) 0.03 % nasal spray, Place 2 sprays into both nostrils every 12 (twelve) hours. (Patient not taking: Reported on 08/20/2023), Disp: 90 mL, Rfl: 1   KLOR-CON  M20 20 MEQ tablet, TAKE 1 TABLET DAILY (Patient not taking: Reported on 08/20/2023), Disp: 90 tablet, Rfl: 3  No Known Allergies  I personally reviewed active problem list, medication list, allergies, family history with the patient/caregiver today.   ROS  Ten systems reviewed and is negative except as mentioned in HPI    Objective  Vitals:   08/20/23 1007  BP: 134/72  Pulse: 64  Resp: 16  Temp: 98 F (36.7 C)  TempSrc: Oral  SpO2: 94%  Weight: 213 lb (96.6 kg)  Height: 5' 6 (1.676 m)    Body mass index is 34.38 kg/m.  Physical Exam  Constitutional: Patient appears well-developed and well-nourished. Obese  No distress.  HEENT: head atraumatic, normocephalic, pupils equal and reactive to light,, neck supple Cardiovascular: Normal rate, regular rhythm and normal heart sounds.  No murmur heard. No BLE edema. Pulmonary/Chest: Effort normal and breath sounds normal. No respiratory distress. Abdominal: Soft.  There is no tenderness. Psychiatric: Patient has a normal mood and affect. behavior is normal. Judgment and thought content normal.   Recent Results (from the past 2160 hours)  POCT glycosylated hemoglobin (Hb A1C)     Status: Abnormal   Collection Time: 08/20/23 10:10 AM  Result Value Ref Range   Hemoglobin A1C 6.5 (A) 4.0 - 5.6 %   HbA1c POC (<> result, manual entry)     HbA1c, POC (prediabetic range)     HbA1c, POC (controlled diabetic range)      Diabetic Foot Exam:  PHQ2/9:    08/20/2023    9:59 AM 05/09/2023   10:21 AM 04/03/2023    9:17 AM 12/06/2022    9:29 AM 08/07/2022    9:58 AM  Depression  screen PHQ 2/9  Decreased Interest 0 0 0 0 0  Down, Depressed, Hopeless 0 0 0 0 0  PHQ - 2 Score 0 0 0 0 0  Altered sleeping 0  0 0 0  Tired, decreased energy 0  0 0 0  Change in appetite 0  0 0 0  Feeling bad or failure about yourself  0  0 0 0  Trouble concentrating 0  0 0 0  Moving slowly or fidgety/restless 0  0 0 0  Suicidal thoughts 0  0 0 0  PHQ-9 Score 0  0 0 0  Difficult doing work/chores Not difficult at all  Not difficult at all      phq 9 is negative  Fall Risk:    08/20/2023    9:59 AM 05/16/2023   10:54 AM 05/09/2023   10:18 AM 04/03/2023    9:26 AM 04/03/2023    9:17 AM  Fall Risk   Falls in the past year? 0 0 0 0 0  Number falls in past yr: 0  0  0  Injury with Fall? 0  0  0  Risk for fall due to : No Fall Risks No Fall Risks No Fall Risks No Fall Risks No Fall Risks  Follow up Falls prevention discussed;Education provided;Falls evaluation completed Falls prevention discussed Education provided;Falls prevention discussed Falls prevention discussed Falls prevention discussed;Education provided;Falls evaluation completed     Assessment & Plan  1. Type 2 diabetes mellitus with microalbuminuria, without long-term current use of insulin  (HCC) (Primary)  - POCT glycosylated hemoglobin (Hb A1C) - Microalbumin / creatinine urine ratio  2. Hypertension associated with diabetes (HCC)  - COMPLETE METABOLIC PANEL WITH GFR - amLODipine  (NORVASC ) 5 MG tablet; Take 1 tablet (5 mg total) by mouth every evening.  Dispense: 90 tablet; Refill: 1 - doxazosin  (CARDURA ) 1 MG tablet; Take 1 tablet (1 mg total) by mouth every evening.  Dispense: 90 tablet; Refill: 1 - valsartan -hydrochlorothiazide  (DIOVAN -HCT) 320-25 MG tablet; Take 1 tablet by mouth daily.  Dispense: 90 tablet; Refill: 1  3. Simple chronic bronchitis (HCC)  Unchanged   4. Meningioma (HCC)  Going to repeat MRI in 2026  5. Senile purpura (HCC)  Reassurance given   6. Atherosclerosis of native coronary  artery of native heart with stable angina pectoris (HCC)  stable - Lipid panel  7. Claudication of both lower extremities (HCC)  Refuses referral to vascular surgeon at this time  8. Morbid obesity (HCC)  Discussed with the patient the risk posed by an increased BMI. Discussed importance of portion control, calorie counting and at least 150 minutes of physical activity weekly. Avoid sweet beverages and drink more water. Eat at least 6 servings of fruit and vegetables daily    9. SVT (supraventricular tachycardia) (HCC)  Rate controlled  10. Obstructive sleep apnea on CPAP  - CBC with Differential/Platelet  11. Dyslipidemia associated with type 2 diabetes mellitus (HCC)  - atorvastatin  (LIPITOR) 40 MG tablet; Take 1 tablet (40 mg total) by mouth daily.  Dispense: 90 tablet; Refill: 1

## 2023-08-21 ENCOUNTER — Encounter: Payer: Self-pay | Admitting: Family Medicine

## 2023-08-21 LAB — COMPLETE METABOLIC PANEL WITH GFR
AG Ratio: 2.2 (calc) (ref 1.0–2.5)
ALT: 16 U/L (ref 9–46)
AST: 13 U/L (ref 10–35)
Albumin: 4.3 g/dL (ref 3.6–5.1)
Alkaline phosphatase (APISO): 74 U/L (ref 35–144)
BUN: 16 mg/dL (ref 7–25)
CO2: 28 mmol/L (ref 20–32)
Calcium: 9.3 mg/dL (ref 8.6–10.3)
Chloride: 105 mmol/L (ref 98–110)
Creat: 1.03 mg/dL (ref 0.70–1.22)
Globulin: 2 g/dL (ref 1.9–3.7)
Glucose, Bld: 104 mg/dL — ABNORMAL HIGH (ref 65–99)
Potassium: 4.6 mmol/L (ref 3.5–5.3)
Sodium: 143 mmol/L (ref 135–146)
Total Bilirubin: 0.5 mg/dL (ref 0.2–1.2)
Total Protein: 6.3 g/dL (ref 6.1–8.1)
eGFR: 73 mL/min/{1.73_m2} (ref >=60)

## 2023-08-21 LAB — CBC WITH DIFFERENTIAL/PLATELET
Absolute Lymphocytes: 1327 {cells}/uL (ref 850–3900)
Absolute Monocytes: 664 {cells}/uL (ref 200–950)
Basophils Absolute: 79 {cells}/uL (ref 0–200)
Basophils Relative: 1 %
Eosinophils Absolute: 158 {cells}/uL (ref 15–500)
Eosinophils Relative: 2 %
HCT: 44.4 % (ref 38.5–50.0)
Hemoglobin: 14.4 g/dL (ref 13.2–17.1)
MCH: 29.4 pg (ref 27.0–33.0)
MCHC: 32.4 g/dL (ref 32.0–36.0)
MCV: 90.8 fL (ref 80.0–100.0)
MPV: 10.7 fL (ref 7.5–12.5)
Monocytes Relative: 8.4 %
Neutro Abs: 5672 {cells}/uL (ref 1500–7800)
Neutrophils Relative %: 71.8 %
Platelets: 254 10*3/uL (ref 140–400)
RBC: 4.89 10*6/uL (ref 4.20–5.80)
RDW: 13 % (ref 11.0–15.0)
Total Lymphocyte: 16.8 %
WBC: 7.9 10*3/uL (ref 3.8–10.8)

## 2023-08-21 LAB — MICROALBUMIN / CREATININE URINE RATIO
Creatinine, Urine: 72 mg/dL (ref 20–320)
Microalb Creat Ratio: 26 mg/g{creat} (ref <30)
Microalb, Ur: 1.9 mg/dL

## 2023-08-21 LAB — LIPID PANEL
Cholesterol: 111 mg/dL (ref <200)
HDL: 52 mg/dL (ref >=40)
LDL Cholesterol (Calc): 41 mg/dL
Non-HDL Cholesterol (Calc): 59 mg/dL (ref <130)
Total CHOL/HDL Ratio: 2.1 (calc) (ref <5.0)
Triglycerides: 96 mg/dL (ref <150)

## 2023-09-03 ENCOUNTER — Ambulatory Visit: Payer: Medicare Other

## 2023-09-04 ENCOUNTER — Ambulatory Visit: Payer: Medicare Other

## 2023-09-09 ENCOUNTER — Other Ambulatory Visit: Payer: Self-pay | Admitting: Family Medicine

## 2023-09-09 DIAGNOSIS — I152 Hypertension secondary to endocrine disorders: Secondary | ICD-10-CM

## 2023-09-09 DIAGNOSIS — E1129 Type 2 diabetes mellitus with other diabetic kidney complication: Secondary | ICD-10-CM

## 2023-09-12 DIAGNOSIS — D0462 Carcinoma in situ of skin of left upper limb, including shoulder: Secondary | ICD-10-CM | POA: Diagnosis not present

## 2023-09-30 ENCOUNTER — Other Ambulatory Visit: Payer: Self-pay | Admitting: Family Medicine

## 2023-09-30 DIAGNOSIS — E1129 Type 2 diabetes mellitus with other diabetic kidney complication: Secondary | ICD-10-CM

## 2023-09-30 DIAGNOSIS — E1169 Type 2 diabetes mellitus with other specified complication: Secondary | ICD-10-CM

## 2023-10-10 DIAGNOSIS — C4441 Basal cell carcinoma of skin of scalp and neck: Secondary | ICD-10-CM | POA: Diagnosis not present

## 2023-10-21 DIAGNOSIS — D0439 Carcinoma in situ of skin of other parts of face: Secondary | ICD-10-CM | POA: Diagnosis not present

## 2023-10-21 DIAGNOSIS — C44329 Squamous cell carcinoma of skin of other parts of face: Secondary | ICD-10-CM | POA: Diagnosis not present

## 2023-12-18 ENCOUNTER — Encounter: Payer: Self-pay | Admitting: Family Medicine

## 2023-12-18 ENCOUNTER — Ambulatory Visit: Payer: Medicare Other | Admitting: Family Medicine

## 2023-12-18 VITALS — BP 132/70 | HR 59 | Resp 16 | Ht 66.0 in | Wt 208.5 lb

## 2023-12-18 DIAGNOSIS — I739 Peripheral vascular disease, unspecified: Secondary | ICD-10-CM | POA: Diagnosis not present

## 2023-12-18 DIAGNOSIS — E1169 Type 2 diabetes mellitus with other specified complication: Secondary | ICD-10-CM | POA: Diagnosis not present

## 2023-12-18 DIAGNOSIS — I152 Hypertension secondary to endocrine disorders: Secondary | ICD-10-CM

## 2023-12-18 DIAGNOSIS — I471 Supraventricular tachycardia, unspecified: Secondary | ICD-10-CM | POA: Diagnosis not present

## 2023-12-18 DIAGNOSIS — E785 Hyperlipidemia, unspecified: Secondary | ICD-10-CM | POA: Diagnosis not present

## 2023-12-18 DIAGNOSIS — G4733 Obstructive sleep apnea (adult) (pediatric): Secondary | ICD-10-CM

## 2023-12-18 DIAGNOSIS — D329 Benign neoplasm of meninges, unspecified: Secondary | ICD-10-CM | POA: Diagnosis not present

## 2023-12-18 DIAGNOSIS — E1159 Type 2 diabetes mellitus with other circulatory complications: Secondary | ICD-10-CM

## 2023-12-18 DIAGNOSIS — E1129 Type 2 diabetes mellitus with other diabetic kidney complication: Secondary | ICD-10-CM | POA: Diagnosis not present

## 2023-12-18 DIAGNOSIS — I25118 Atherosclerotic heart disease of native coronary artery with other forms of angina pectoris: Secondary | ICD-10-CM

## 2023-12-18 DIAGNOSIS — J410 Simple chronic bronchitis: Secondary | ICD-10-CM | POA: Diagnosis not present

## 2023-12-18 LAB — POCT GLYCOSYLATED HEMOGLOBIN (HGB A1C): Hemoglobin A1C: 6.4 % — AB (ref 4.0–5.6)

## 2023-12-18 MED ORDER — METFORMIN HCL ER 750 MG PO TB24: ORAL_TABLET | ORAL | 0 refills | Status: DC

## 2023-12-18 NOTE — Progress Notes (Signed)
 Name: Jason Farrell.   MRN: [id number]    DOB: 06-Nov-1940   Date:12/18/2023       Progress Note  Subjective  Chief Complaint  Chief Complaint  Patient presents with   Medical Management of Chronic Issues   HPI   HTN/SVT:  He is on Amiodarone   and Toprol  XL 25 mg daily for tachycardia  He is also taking Norvasc  5 mg and Valsartan  hctz 320/25,  bp at home has been 120's-130's/40's-60's at home but he feels well on current dose. No dizziness     Meningioma: he had a partial resection done at [facility] by Dr. Debrah Fan in 2020, he states he has been doing well. Denies memory changes,  confusion, states no balanced problems, he denies headaches, no blurred or double vision. He had a repeat  MRI in Feb 2024  and saw Dr. Mason Sole in 01/2023 and is scheduled to follow up in 2 years    MRI brain 07/22 1. 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 2. Stable appearance of small left frontal convexity meningioma. 3. Mildly enlarged left thyroid  gland heterogeneously enhancing lesion, now measuring approximately 2.5 cm.   IMPRESSION:08/2022    1. 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 2. Unchanged 1.3 cm left frontal convexity meningioma. 3. Mild enlargement of a 2.8 cm left parotid mass.     DMII: he was diagnosed many years ago, he is taking Metformin  XR 1500 mg  and Glipizide  2.5 mg XL  He has associated dyslipidemia, HTN and obesity . He is on ARB, statin therapy and denies side effects of medications. He denies hypoglycemic episodes    Claudication: he states he has aching on both legs when he walks for about 20 minutes , thigh and lower leg, unchanged . On statin therapy and also takes aspirin  daily . Discussed referral to vascular surgeon but he would like to hold off . He staying active at home    Obesity: he states he has been eating more than usual and weight is trending down now, eating smaller  portions   Chronic bronchitis  with history of tobacco use: he denies daily cough, mild SOB with moderate activity, denies wheezing.  Quit smoking in 1996, after MI. 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 He is asymptomatic . Unchanged    Hearing loss: he recently got an otc hearing aid and is doing well . 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    Status post craniotomy 08/12/2018   Senile purpura (HCC) 02/21/2018   Type 2 diabetes mellitus with microalbuminuria, without long-term current use of insulin  (HCC) 02/21/2018   Osteoarthritis of hip 11/04/2017   Obstructive sleep apnea on CPAP 12/09/2015   OA (osteoarthritis) of knee 05/06/2015    Gastroesophageal reflux disease without esophagitis 05/06/2015   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) 01/25/2015   Hyperlipidemia 08/16/2014   Essential hypertension 08/16/2014   Stopped smoking with greater than 30 pack year history 08/16/2014   COLD (chronic obstructive lung disease) (HCC) 08/16/2014    Past Surgical History:  Procedure Laterality Date   CARDIAC CATHETERIZATION  2006   stent placement   COLONOSCOPY     CORONARY ANGIOPLASTY WITH STENT PLACEMENT  2006   stent x 2    CORONARY ANGIOPLASTY WITH STENT PLACEMENT  1996   stent x 3    CRANIOTOMY  08/12/2018   FOOT SURGERY Right    HALLUX VALGUS CHEILECTOMY Right 02/25/2015   Procedure: HALLUX VALGUS CHEILECTOMY;  Surgeon: Sharlyn Deaner, DPM;  Location: [facility] ORS;  Service: Podiatry;  Laterality: Right;    Family History  Problem Relation Age of Onset   Hyperlipidemia Father    Hypertension Father    Alcohol abuse Father    Cancer Sister        lung   Hypertension Sister    Heart disease Sister    Hyperlipidemia Sister    COPD Sister    Cancer Sister        unknown    Social History   Tobacco Use   Smoking status: Former    Current packs/day: 0.00    Average packs/day: 1.5 packs/day for 35.1 years (52.7 ttl pk-yrs)    Types: Cigarettes    Start date: 07/17/1959    Quit date: 08/27/1994    Years since quitting: 29.3   Smokeless tobacco: Never   Tobacco comments:    smoking cessation materials not required  Substance Use Topics   Alcohol use: Not Currently    Alcohol/week: 0.0 standard drinks of alcohol     Current Outpatient Medications:    acetaminophen  (TYLENOL ) 325 MG tablet, Take 1-2 tablets (325-650 mg total) by mouth every 4 (four) hours as needed for mild pain., Disp: , Rfl:    amiodarone  (PACERONE ) 200 MG tablet, TAKE 1 TABLET DAILY, Disp: 90 tablet, Rfl: 2   amLODipine  (NORVASC ) 5 MG tablet, Take 1 tablet (5 mg total) by mouth every evening., Disp: 90  tablet, Rfl: 1   aspirin  81 MG tablet, Take 81 mg by mouth daily., Disp: , Rfl:    atorvastatin  (LIPITOR) 40 MG tablet, Take 1 tablet (40 mg total) by mouth daily., Disp: 90 tablet, Rfl: 1   Cholecalciferol (VITAMIN D ) 50 MCG (2000 UT) tablet, Take 2,000 Units by mouth daily., Disp: , Rfl:    docusate sodium  (COLACE) 100 MG capsule, Take 100 mg by mouth 2 (two) times daily. Pt takes every other day, Disp: , Rfl:    doxazosin  (CARDURA ) 1 MG tablet, Take 1 tablet (1  mg total) by mouth every evening., Disp: 90 tablet, Rfl: 1   ferrous sulfate  325 (65 FE) MG tablet, Take 1 tablet (325 mg total) by mouth 2 (two) times daily with a meal. Add colace 100 mg (Patient taking differently: Take 325 mg by mouth daily with breakfast.), Disp: 180 tablet, Rfl: 1   glipiZIDE  (GLUCOTROL  XL) 2.5 MG 24 hr tablet, TAKE 1 TABLET DAILY WITH BREAKFAST, Disp: 90 tablet, Rfl: 1   glucose blood test strip, Use as instructed, Disp: 100 each, Rfl: 12   KLOR-CON  M20 20 MEQ tablet, Take 20 mEq by mouth daily., Disp: , Rfl:    magnesium  oxide (MAG-OX) 400 (241.3 Mg) MG tablet, Take 1 tablet (400 mg total) by mouth 2 (two) times daily., Disp: 60 tablet, Rfl: 0   metFORMIN  (GLUCOPHAGE -XR) 750 MG 24 hr tablet, TAKE 2 TABLETS DAILY WITH BREAKFAST, Disp: 180 tablet, Rfl: 0   metoprolol  succinate (TOPROL -XL) 25 MG 24 hr tablet, TAKE 1 TABLET EVERY EVENING, Disp: 90 tablet, Rfl: 3   Multiple Vitamins-Minerals (CENTRUM SILVER PO), Take 1 tablet by mouth daily. , Disp: , Rfl:    valsartan -hydrochlorothiazide  (DIOVAN -HCT) 320-25 MG tablet, Take 1 tablet by mouth daily., Disp: 90 tablet, Rfl: 1   ipratropium (ATROVENT ) 0.03 % nasal spray, Place 2 sprays into both nostrils every 12 (twelve) hours. (Patient not taking: Reported on 12/18/2023), Disp: 90 mL, Rfl: 1  No Known Allergies  I personally reviewed active problem list, medication list, allergies with the patient/caregiver today.   ROS  Ten systems reviewed and is negative except as  mentioned in HPI    Objective Physical Exam Constitutional: Patient appears well-developed and well-nourished. Obese  No distress.  HEENT: head atraumatic, normocephalic, pupils equal and reactive to light, neck supple Cardiovascular: Normal rate, regular rhythm and normal heart sounds.  No murmur heard. No BLE edema. Pulmonary/Chest: Effort normal and breath sounds normal. No respiratory distress. Abdominal: Soft.  There is no tenderness. Psychiatric: Patient has a normal mood and affect. behavior is normal. Judgment and thought content normal.    Vitals:   12/18/23 0950  BP: 132/70  Pulse: (!) 59  Resp: 16  SpO2: 99%  Weight: 208 lb 8 oz (94.6 kg)  Height: 5\' 6"  (1.676 m)    Body mass index is 33.65 kg/m.  Recent Results (from the past 2160 hours)  POCT glycosylated hemoglobin (Hb A1C)     Status: Abnormal   Collection Time: 12/18/23  9:56 AM  Result Value Ref Range   Hemoglobin A1C 6.4 (A) 4.0 - 5.6 %   HbA1c POC (<> result, manual entry)     HbA1c, POC (prediabetic range)     HbA1c, POC (controlled diabetic range)      Diabetic Foot Exam:  Diabetic foot exam was performed with the following findings:   Normal sensation of 10g monofilament Intact posterior tibialis and dorsalis pedis pulses Onychomycosis multiple nails       PHQ2/9:    12/18/2023    9:43 AM 08/20/2023    9:59 AM 05/09/2023   10:21 AM 04/03/2023    9:17 AM 12/06/2022    9:29 AM  Depression screen PHQ 2/9  Decreased Interest 0 0 0 0 0  Down, Depressed, Hopeless 0 0 0 0 0  PHQ - 2 Score 0 0 0 0 0  Altered sleeping  0  0 0  Tired, decreased energy  0  0 0  Change in appetite  0  0 0  Feeling bad or failure  about yourself   0  0 0  Trouble concentrating  0  0 0  Moving slowly or fidgety/restless  0  0 0  Suicidal thoughts  0  0 0  PHQ-9 Score  0  0 0  Difficult doing work/chores  Not difficult at all  Not difficult at all     phq 9 is negative  Fall Risk:    12/18/2023    9:43 AM 08/20/2023     9:59 AM 05/16/2023   10:54 AM 05/09/2023   10:18 AM 04/03/2023    9:26 AM  Fall Risk   Falls in the past year? 1 0 0 0 0  Number falls in past yr: 0 0  0   Injury with Fall? 0 0  0   Risk for fall due to : Impaired balance/gait No Fall Risks No Fall Risks No Fall Risks No Fall Risks  Follow up Falls prevention discussed;Education provided;Falls evaluation completed Falls prevention discussed;Education provided;Falls evaluation completed Falls prevention discussed Education provided;Falls prevention discussed Falls prevention discussed      Assessment & Plan Type 2 Diabetes Mellitus with dyslipidemia, HTN, CAD  Diabetes well controlled with A1c of 6.4. Blood glucose 107-140 mg/dL. No hypoglycemia. Considering non-invasive glucose monitoring. - Continue metformin  750 mg twice daily. - Continue glipizide  2.5 mg daily. - Encourage continued dietary modifications to manage weight. - Refill metformin  prescription.  Hypertension Hypertension controlled with valsartan  HCTZ and amlodipine . Blood pressure 120-140/40-60 mmHg. Valsartan  for kidney protection due to microalbuminuria. - Continue valsartan  HCTZ. - Continue amlodipine .  Coronary Artery Disease Coronary artery disease with stent placement. No angina. On atorvastatin  for cholesterol. Cardiologist follow-up ongoing. - Continue atorvastatin  40 mg daily. - Continue follow-up with cardiologist.  Supraventricular Tachycardia Supraventricular tachycardia managed with amiodarone . No recent symptom changes. - Continue amiodarone .  Meningioma Residual meningioma with mild enlargement. Neurologist follow-up scheduled. No neurological symptoms. - Continue follow-up with neurologist.  Simple Chronic Bronchitis Chronic bronchitis with mild exertional dyspnea. Quit smoking. No inhaler use, wheezing, or cough. - Encourage continued smoking cessation.  Obstructive Sleep Apnea Obstructive sleep apnea managed with nightly CPAP. No  snoring or morning headaches. - Continue CPAP use nightly.  Vasomotor Rhinitis Vasomotor rhinitis improved with nasal spray. - Continue nasal spray as needed.  Osteoarthritis of the Knee Severe osteoarthritis of left knee. Considering knee replacement surgery due to potential future complications. - Discuss potential knee replacement surgery.  Diabetes with Onychomycosis/diabetes with dyslipidemia Fungal toenail infection. Under podiatric care. No current treatment due to lack of symptoms. - Consider treatment if symptoms worsen.  Actinic Keratosis Actinic keratosis on arms. Under dermatological care. History of basal cell carcinoma. - Continue dermatology follow-up.  Goals of Care Discussed potential knee replacement surgery due to osteoarthritis. Considering future options for quality of life. - Discuss goals of care regarding knee replacement.  Follow-up Scheduled follow-up in four months for routine evaluation and blood work, including TSH due to amiodarone  use. - Schedule follow-up appointment in four months. - Plan for blood work including TSH at next visit.

## 2023-12-26 DIAGNOSIS — Z23 Encounter for immunization: Secondary | ICD-10-CM | POA: Diagnosis not present

## 2024-01-23 ENCOUNTER — Other Ambulatory Visit: Payer: Self-pay | Admitting: Cardiovascular Disease

## 2024-01-30 DIAGNOSIS — Z85828 Personal history of other malignant neoplasm of skin: Secondary | ICD-10-CM | POA: Diagnosis not present

## 2024-01-30 DIAGNOSIS — D485 Neoplasm of uncertain behavior of skin: Secondary | ICD-10-CM | POA: Diagnosis not present

## 2024-01-30 DIAGNOSIS — C44319 Basal cell carcinoma of skin of other parts of face: Secondary | ICD-10-CM | POA: Diagnosis not present

## 2024-01-30 DIAGNOSIS — D225 Melanocytic nevi of trunk: Secondary | ICD-10-CM | POA: Diagnosis not present

## 2024-01-30 DIAGNOSIS — L821 Other seborrheic keratosis: Secondary | ICD-10-CM | POA: Diagnosis not present

## 2024-01-30 DIAGNOSIS — D2271 Melanocytic nevi of right lower limb, including hip: Secondary | ICD-10-CM | POA: Diagnosis not present

## 2024-01-30 DIAGNOSIS — Z08 Encounter for follow-up examination after completed treatment for malignant neoplasm: Secondary | ICD-10-CM | POA: Diagnosis not present

## 2024-01-30 DIAGNOSIS — D2272 Melanocytic nevi of left lower limb, including hip: Secondary | ICD-10-CM | POA: Diagnosis not present

## 2024-01-30 DIAGNOSIS — L570 Actinic keratosis: Secondary | ICD-10-CM | POA: Diagnosis not present

## 2024-01-30 DIAGNOSIS — D2262 Melanocytic nevi of left upper limb, including shoulder: Secondary | ICD-10-CM | POA: Diagnosis not present

## 2024-01-30 DIAGNOSIS — L820 Inflamed seborrheic keratosis: Secondary | ICD-10-CM | POA: Diagnosis not present

## 2024-01-30 DIAGNOSIS — D2261 Melanocytic nevi of right upper limb, including shoulder: Secondary | ICD-10-CM | POA: Diagnosis not present

## 2024-02-19 ENCOUNTER — Other Ambulatory Visit: Payer: Self-pay | Admitting: Family Medicine

## 2024-02-19 DIAGNOSIS — I152 Hypertension secondary to endocrine disorders: Secondary | ICD-10-CM

## 2024-03-05 ENCOUNTER — Other Ambulatory Visit: Payer: Self-pay | Admitting: Family Medicine

## 2024-03-05 DIAGNOSIS — E1159 Type 2 diabetes mellitus with other circulatory complications: Secondary | ICD-10-CM

## 2024-03-05 DIAGNOSIS — E1129 Type 2 diabetes mellitus with other diabetic kidney complication: Secondary | ICD-10-CM

## 2024-03-05 NOTE — Telephone Encounter (Signed)
 Requested Prescriptions  Pending Prescriptions Disp Refills   glipiZIDE  (GLUCOTROL  XL) 2.5 MG 24 hr tablet [Pharmacy Med Name: GLIPIZIDE  ER TABS 2.5MG ] 90 tablet 1    Sig: TAKE 1 TABLET DAILY WITH BREAKFAST     Endocrinology:  Diabetes - Sulfonylureas Passed - 03/05/2024  4:40 PM      Passed - HBA1C is between 0 and 7.9 and within 180 days    Hemoglobin A1C  Date Value Ref Range Status  12/18/2023 6.4 (A) 4.0 - 5.6 % Final   HbA1c, POC (controlled diabetic range)  Date Value Ref Range Status  08/21/2019 6.0 0.0 - 7.0 % Final   Hgb A1c MFr Bld  Date Value Ref Range Status  12/06/2022 6.5 (H) <5.7 % of total Hgb Final    Comment:    For someone without known diabetes, a hemoglobin A1c value of 6.5% or greater indicates that they may have  diabetes and this should be confirmed with a follow-up  test. . For someone with known diabetes, a value <7% indicates  that their diabetes is well controlled and a value  greater than or equal to 7% indicates suboptimal  control. A1c targets should be individualized based on  duration of diabetes, age, comorbid conditions, and  other considerations. . Currently, no consensus exists regarding use of hemoglobin A1c for diagnosis of diabetes for children. .          Passed - Cr in normal range and within 360 days    Creat  Date Value Ref Range Status  08/20/2023 1.03 0.70 - 1.22 mg/dL Final   Creatinine, Urine  Date Value Ref Range Status  08/20/2023 72 20 - 320 mg/dL Final         Passed - Valid encounter within last 6 months    Recent Outpatient Visits           2 months ago Type 2 diabetes mellitus with microalbuminuria, without long-term current use of insulin  [facility])   [facility] [name], MD   6 months ago Type 2 diabetes mellitus with microalbuminuria, without long-term current use of insulin  [facility])   [facility] [name], MD       Future Appointments              In 1 month Glenard, Krichna, MD [facility]

## 2024-03-17 ENCOUNTER — Other Ambulatory Visit: Payer: Self-pay | Admitting: Family Medicine

## 2024-03-17 DIAGNOSIS — E1169 Type 2 diabetes mellitus with other specified complication: Secondary | ICD-10-CM

## 2024-03-17 DIAGNOSIS — E1129 Type 2 diabetes mellitus with other diabetic kidney complication: Secondary | ICD-10-CM

## 2024-03-24 DIAGNOSIS — Z23 Encounter for immunization: Secondary | ICD-10-CM | POA: Diagnosis not present

## 2024-03-26 ENCOUNTER — Other Ambulatory Visit: Payer: Self-pay | Admitting: Cardiovascular Disease

## 2024-03-26 DIAGNOSIS — E1159 Type 2 diabetes mellitus with other circulatory complications: Secondary | ICD-10-CM

## 2024-03-26 DIAGNOSIS — I10 Essential (primary) hypertension: Secondary | ICD-10-CM

## 2024-03-30 DIAGNOSIS — C44319 Basal cell carcinoma of skin of other parts of face: Secondary | ICD-10-CM | POA: Diagnosis not present

## 2024-04-01 ENCOUNTER — Other Ambulatory Visit: Payer: Self-pay | Admitting: Family Medicine

## 2024-04-01 DIAGNOSIS — E1169 Type 2 diabetes mellitus with other specified complication: Secondary | ICD-10-CM

## 2024-04-06 DIAGNOSIS — C44319 Basal cell carcinoma of skin of other parts of face: Secondary | ICD-10-CM | POA: Diagnosis not present

## 2024-04-09 DIAGNOSIS — Z23 Encounter for immunization: Secondary | ICD-10-CM | POA: Diagnosis not present

## 2024-04-20 ENCOUNTER — Ambulatory Visit: Admitting: Family Medicine

## 2024-04-20 ENCOUNTER — Encounter: Payer: Self-pay | Admitting: Family Medicine

## 2024-04-20 VITALS — BP 128/72 | HR 69 | Resp 16 | Ht 66.0 in | Wt 203.5 lb

## 2024-04-20 DIAGNOSIS — E1159 Type 2 diabetes mellitus with other circulatory complications: Secondary | ICD-10-CM | POA: Diagnosis not present

## 2024-04-20 DIAGNOSIS — J410 Simple chronic bronchitis: Secondary | ICD-10-CM | POA: Diagnosis not present

## 2024-04-20 DIAGNOSIS — E785 Hyperlipidemia, unspecified: Secondary | ICD-10-CM | POA: Diagnosis not present

## 2024-04-20 DIAGNOSIS — I25118 Atherosclerotic heart disease of native coronary artery with other forms of angina pectoris: Secondary | ICD-10-CM | POA: Diagnosis not present

## 2024-04-20 DIAGNOSIS — G4733 Obstructive sleep apnea (adult) (pediatric): Secondary | ICD-10-CM

## 2024-04-20 DIAGNOSIS — I471 Supraventricular tachycardia, unspecified: Secondary | ICD-10-CM

## 2024-04-20 DIAGNOSIS — I10 Essential (primary) hypertension: Secondary | ICD-10-CM

## 2024-04-20 DIAGNOSIS — E669 Obesity, unspecified: Secondary | ICD-10-CM | POA: Diagnosis not present

## 2024-04-20 DIAGNOSIS — E1129 Type 2 diabetes mellitus with other diabetic kidney complication: Secondary | ICD-10-CM

## 2024-04-20 DIAGNOSIS — D329 Benign neoplasm of meninges, unspecified: Secondary | ICD-10-CM | POA: Diagnosis not present

## 2024-04-20 DIAGNOSIS — E1169 Type 2 diabetes mellitus with other specified complication: Secondary | ICD-10-CM

## 2024-04-20 DIAGNOSIS — Z1211 Encounter for screening for malignant neoplasm of colon: Secondary | ICD-10-CM

## 2024-04-20 DIAGNOSIS — I739 Peripheral vascular disease, unspecified: Secondary | ICD-10-CM

## 2024-04-20 DIAGNOSIS — E119 Type 2 diabetes mellitus without complications: Secondary | ICD-10-CM

## 2024-04-20 DIAGNOSIS — I152 Hypertension secondary to endocrine disorders: Secondary | ICD-10-CM

## 2024-04-20 LAB — POCT GLYCOSYLATED HEMOGLOBIN (HGB A1C): Hemoglobin A1C: 6.4 % — AB (ref 4.0–5.6)

## 2024-04-20 MED ORDER — DOXAZOSIN MESYLATE 1 MG PO TABS: 1.0000 mg | ORAL_TABLET | Freq: Every evening | ORAL | 1 refills | Status: DC

## 2024-04-20 MED ORDER — AMLODIPINE BESYLATE 5 MG PO TABS: 5.0000 mg | ORAL_TABLET | Freq: Every evening | ORAL | 1 refills | Status: AC

## 2024-04-20 MED ORDER — VALSARTAN-HYDROCHLOROTHIAZIDE 320-25 MG PO TABS: 1.0000 | ORAL_TABLET | Freq: Every day | ORAL | 1 refills | Status: AC

## 2024-04-20 MED ORDER — GLIPIZIDE ER 2.5 MG PO TB24: 2.5000 mg | ORAL_TABLET | Freq: Every day | ORAL | 1 refills | Status: AC

## 2024-04-20 MED ORDER — ATORVASTATIN CALCIUM 40 MG PO TABS: 40.0000 mg | ORAL_TABLET | Freq: Every day | ORAL | 1 refills | Status: AC

## 2024-04-20 MED ORDER — METOPROLOL SUCCINATE ER 25 MG PO TB24: 25.0000 mg | ORAL_TABLET | Freq: Every evening | ORAL | 1 refills | Status: AC

## 2024-04-20 MED ORDER — METFORMIN HCL ER 750 MG PO TB24: 750.0000 mg | ORAL_TABLET | Freq: Every day | ORAL | 1 refills | Status: DC

## 2024-04-20 NOTE — Progress Notes (Addendum)
 Name: Jason Farrell.   MRN: [id number]    DOB: 1940-08-25   Date:04/20/2024       Progress Note  Subjective  Chief Complaint  Chief Complaint  Patient presents with   Medical Management of Chronic Issues   Discussed the use of AI scribe software for clinical note transcription with the patient, who gave verbal consent to proceed.  History of Present Illness Jason Farrell. Jason Farrell is an 83 year old male who presents for a regular follow-up and requests a colonoscopy.  He requests a colonoscopy, primarily at the behest of his wife. He has no blood in stools or abdominal pain but experiences gas that feels like 'bubbles popping' in his chest, ongoing for about two years without worsening.  He has a history of arthritis, particularly affecting his left knee. An orthopedic specialist recommended knee replacement two to three years ago, but he has not pursued this. His knees do not currently bother him significantly.  He has type 2 diabetes and is currently taking metformin  750 mg twice daily and glipizide  XL  2.5 mg once daily. His recent A1c is 6.4, and he has no symptoms of hyperglycemia or hypoglycemia. He follows a diet rich in vegetables and fruits, contributing to a weight loss of about five and a half pounds.  He has hypertension, obesity, dyslipidemia, and coronary artery disease. He takes atorvastatin  40 mg, valsartan  HCTZ 320/25 mg, and amlodipine  5 mg. He also takes amiodarone  200 mg and metoprolol   25 mg XL for supraventricular tachycardia  No chest pain, palpitations, or shortness of breath. He sees Dr. Gollan but needs to schedule a follow up visit, last TSH was normal   He has a history of smoking and has simple chronic bronchitis but denies any current shortness of breath or cough at this time. He has not been using inahlers  He uses a CPAP machine for sleep apnea and feels rested when he wakes up - he feels machine is benefiting his health.    He reports that his  hip aches when walking longer distances without stopping, but has not seen a vascular doctor recently.  He has a history of meningioma that was partially resected and mild enlargement noted on follow-up. No headaches, double vision, or balance issues.  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    Status post craniotomy 08/12/2018   Senile purpura 02/21/2018   Type 2 diabetes mellitus with microalbuminuria, without long-term current use of insulin  (HCC) 02/21/2018   Osteoarthritis of hip 11/04/2017   Obstructive sleep apnea on CPAP 12/09/2015   OA (osteoarthritis) of knee 05/06/2015   Gastroesophageal reflux disease without esophagitis 05/06/2015   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 01/25/2015   Hyperlipidemia 08/16/2014   Essential hypertension 08/16/2014   Stopped smoking with greater than 30 pack year history 08/16/2014   COLD (chronic obstructive lung disease) (HCC) 08/16/2014    Past Surgical History:  Procedure Laterality Date   CARDIAC CATHETERIZATION  2006   stent placement   COLONOSCOPY     CORONARY ANGIOPLASTY WITH  STENT PLACEMENT  2006   stent x 2    CORONARY ANGIOPLASTY WITH STENT PLACEMENT  1996   stent x 3    CRANIOTOMY  08/12/2018   FOOT SURGERY Right    HALLUX VALGUS CHEILECTOMY Right 02/25/2015   Procedure: HALLUX VALGUS CHEILECTOMY;  Surgeon: [name], DPM;  Location: [facility] ORS;  Service: Podiatry;  Laterality: Right;    Family History  Problem Relation Age of Onset   Hyperlipidemia Father    Hypertension  Father    Alcohol abuse Father    Cancer Sister        lung   Hypertension Sister    Heart disease Sister    Hyperlipidemia Sister    COPD Sister    Cancer Sister        unknown    Social History   Tobacco Use   Smoking status: Former    Current packs/day: 0.00    Average packs/day: 1.5 packs/day for 35.1 years (52.7 ttl pk-yrs)    Types: Cigarettes    Start date: 07/17/1959    Quit date: 08/27/1994    Years since quitting: 29.6   Smokeless tobacco: Never   Tobacco comments:    smoking cessation materials not required  Substance Use Topics   Alcohol use: Not Currently    Alcohol/week: 0.0 standard drinks of alcohol     Current Outpatient Medications:    acetaminophen  (TYLENOL ) 325 MG tablet, Take 1-2 tablets (325-650 mg total) by mouth every 4 (four) hours as needed for mild pain., Disp: , Rfl:    amiodarone  (PACERONE ) 200 MG tablet, TAKE 1 TABLET DAILY, Disp: 90 tablet, Rfl: 0   amLODipine  (NORVASC ) 5 MG tablet, TAKE 1 TABLET EVERY EVENING, Disp: 90 tablet, Rfl: 0   aspirin  81 MG tablet, Take 81 mg by mouth daily., Disp: , Rfl:    atorvastatin  (LIPITOR) 40 MG tablet, TAKE 1 TABLET DAILY, Disp: 90 tablet, Rfl: 0   Cholecalciferol (VITAMIN D ) 50 MCG (2000 UT) tablet, Take 2,000 Units by mouth daily., Disp: , Rfl:    docusate sodium  (COLACE) 100 MG capsule, Take 100 mg by mouth 2 (two) times daily. Pt takes every other day, Disp: , Rfl:    doxazosin  (CARDURA ) 1 MG tablet, Take 1 tablet (1 mg total) by mouth every evening., Disp: 90 tablet, Rfl: 1   ferrous sulfate  325 (65 FE) MG tablet, Take 1 tablet (325 mg total) by mouth 2 (two) times daily with a meal. Add colace 100 mg (Patient taking differently: Take 325 mg by mouth daily with breakfast.), Disp: 180 tablet, Rfl: 1   glipiZIDE  (GLUCOTROL  XL) 2.5 MG 24 hr tablet, TAKE 1 TABLET DAILY WITH BREAKFAST, Disp: 90 tablet, Rfl: 1   glucose blood test strip, Use as instructed, Disp: 100 each, Rfl: 12   KLOR-CON  M20 20 MEQ tablet,  Take 20 mEq by mouth daily., Disp: , Rfl:    magnesium  oxide (MAG-OX) 400 (241.3 Mg) MG tablet, Take 1 tablet (400 mg total) by mouth 2 (two) times daily., Disp: 60 tablet, Rfl: 0   metFORMIN  (GLUCOPHAGE -XR) 750 MG 24 hr tablet, TAKE 2 TABLETS DAILY WITH BREAKFAST, Disp: 180 tablet, Rfl: 0   metoprolol  succinate (TOPROL -XL) 25 MG 24 hr tablet, TAKE 1 TABLET EVERY EVENING, Disp: 30 tablet, Rfl: 0   Multiple Vitamins-Minerals (CENTRUM SILVER PO), Take 1 tablet by mouth daily. , Disp: , Rfl:    valsartan -hydrochlorothiazide  (DIOVAN -HCT) 320-25 MG tablet, TAKE 1 TABLET DAILY, Disp: 90 tablet, Rfl: 0   ipratropium (  ATROVENT ) 0.03 % nasal spray, Place 2 sprays into both nostrils every 12 (twelve) hours. (Patient not taking: Reported on 04/20/2024), Disp: 90 mL, Rfl: 1  No Known Allergies  I personally reviewed active problem list, medication list, allergies with the patient/caregiver today.   ROS  Ten systems reviewed and is negative except as mentioned in HPI    Objective Physical Exam  CONSTITUTIONAL: Patient appears well-developed and well-nourished.  No distress. HEENT: Head atraumatic, normocephalic, neck supple. CARDIOVASCULAR: Normal rate, regular rhythm and normal heart sounds.  No murmur heard. No BLE edema. PULMONARY: Effort normal and breath sounds normal. No respiratory distress. ABDOMINAL: There is no tenderness or distention. MUSCULOSKELETAL: Normal gait. Without gross motor or sensory deficit. PSYCHIATRIC: Patient has a normal mood and affect. behavior is normal. Judgment and thought content normal.  Vitals:   04/20/24 1017  BP: 128/72  Pulse: 69  Resp: 16  SpO2: 98%  Weight: 203 lb 8 oz (92.3 kg)  Height: 5' 6 (1.676 m)    Body mass index is 32.85 kg/m.  Recent Results (from the past 2160 hours)  POCT glycosylated hemoglobin (Hb A1C)     Status: Abnormal   Collection Time: 04/20/24 10:27 AM  Result Value Ref Range   Hemoglobin A1C 6.4 (A) 4.0 - 5.6 %   HbA1c  POC (<> result, manual entry)     HbA1c, POC (prediabetic range)     HbA1c, POC (controlled diabetic range)       PHQ2/9:    04/20/2024   10:11 AM 12/18/2023    9:43 AM 08/20/2023    9:59 AM 05/09/2023   10:21 AM 04/03/2023    9:17 AM  Depression screen PHQ 2/9  Decreased Interest 0 0 0 0 0  Down, Depressed, Hopeless 0 0 0 0 0  PHQ - 2 Score 0 0 0 0 0  Altered sleeping   0  0  Tired, decreased energy   0  0  Change in appetite   0  0  Feeling bad or failure about yourself    0  0  Trouble concentrating   0  0  Moving slowly or fidgety/restless   0  0  Suicidal thoughts   0  0  PHQ-9 Score   0  0  Difficult doing work/chores   Not difficult at all  Not difficult at all    phq 9 is negative  Fall Risk:    04/20/2024   10:11 AM 12/18/2023    9:43 AM 08/20/2023    9:59 AM 05/16/2023   10:54 AM 05/09/2023   10:18 AM  Fall Risk   Falls in the past year? 0 1 0 0 0  Number falls in past yr: 0 0 0  0  Injury with Fall? 0 0 0  0  Risk for fall due to : No Fall Risks Impaired balance/gait No Fall Risks No Fall Risks No Fall Risks  Follow up Falls evaluation completed Falls prevention discussed;Education provided;Falls evaluation completed Falls prevention discussed;Education provided;Falls evaluation completed Falls prevention discussed Education provided;Falls prevention discussed      Assessment & Plan Colorectal cancer screening 83 year old male interested in screening. Higher risk of complications with colonoscopy due to age. - Order Cologuard test. - Discuss potential GI referral if Cologuard positive or symptoms develop.  Atherosclerotic heart disease of native coronary artery with angina Coronary artery disease with previous stenting. Asymptomatic on appropriate cardiac medications. - Continue amiodarone , metoprolol , and atorvastatin .  Supraventricular tachycardia on antiarrhythmic therapy  SVT managed with amiodarone  and metoprolol . Thyroid  function needs monitoring due to  amiodarone . - Continue amiodarone  200 mg daily. - Continue metoprolol  25 mg daily. - Plan thyroid  function test at next visit.  Peripheral vascular disease with claudication Reports claudication with prolonged walking. Prefers not to pursue further evaluation currently. - Monitor symptoms and consider vascular referral if symptoms worsen.  Type 2 diabetes mellitus with dyslipidemia Diabetes well-controlled with A1c of 6.4. Dyslipidemia managed with atorvastatin . Diet contributing to weight loss and improved control. - Continue metformin  750 mg twice daily. - Continue glipizide  2.5 mg daily. - Continue atorvastatin  40 mg daily. - Encourage dietary management with focus on vegetables and fruits.  Obesity Previously morbidly obese, BMI improved to 32.85. Weight loss attributed to dietary changes. - Encourage continued dietary modifications for weight management.  Simple chronic bronchitis Chronic bronchitis with previous CT showing emphysema. Asymptomatic.  Obstructive sleep apnea on CPAP Uses CPAP nightly. No reported issues. - Continue CPAP therapy nightly.  Benign meningioma, residual, under surveillance Residual meningioma with mild enlargement. Under annual surveillance. No neurological symptoms reported. - Continue annual surveillance with neurosurgeon.  Left knee osteoarthritis, post-evaluation for replacement Previously evaluated for replacement. No current symptoms. Considering future intervention based on health status.

## 2024-04-20 NOTE — Patient Instructions (Signed)
 Team Member Role and Visual merchandiser Info Address Start End Comments  [place], Evalene PARAS, MD Consulting Physician (Cardiology) Phone: [phone] Fax: [phone] Email: [email or web address] [address] [place] [address] 08/16/2014 - -

## 2024-04-21 ENCOUNTER — Other Ambulatory Visit: Payer: Self-pay

## 2024-04-21 DIAGNOSIS — E1129 Type 2 diabetes mellitus with other diabetic kidney complication: Secondary | ICD-10-CM

## 2024-04-21 DIAGNOSIS — E1169 Type 2 diabetes mellitus with other specified complication: Secondary | ICD-10-CM

## 2024-04-21 MED ORDER — METFORMIN HCL ER 750 MG PO TB24: 1500.0000 mg | ORAL_TABLET | Freq: Every day | ORAL | 1 refills | Status: AC

## 2024-04-22 ENCOUNTER — Other Ambulatory Visit: Payer: Self-pay | Admitting: Cardiovascular Disease

## 2024-04-27 DIAGNOSIS — Z1211 Encounter for screening for malignant neoplasm of colon: Secondary | ICD-10-CM | POA: Diagnosis not present

## 2024-05-03 LAB — COLOGUARD: COLOGUARD: NEGATIVE

## 2024-05-04 ENCOUNTER — Ambulatory Visit: Payer: Self-pay | Admitting: Family Medicine

## 2024-05-14 ENCOUNTER — Ambulatory Visit: Payer: Medicare Other

## 2024-07-21 ENCOUNTER — Other Ambulatory Visit: Payer: Self-pay | Admitting: Cardiovascular Disease

## 2024-07-21 ENCOUNTER — Telehealth: Payer: Self-pay

## 2024-07-21 NOTE — Telephone Encounter (Signed)
 Copied from CRM [id number]. Topic: Clinical - Order For Equipment >> Jul 21, 2024 10:51 AM Nathanel BROCKS wrote: Reason for CRM: [facility], [place], [id number]  They need [facility] medical records to support for the request cpap machine. Notes to that he benefits from wearing it and using it.  Fax [phone]

## 2024-07-22 NOTE — Telephone Encounter (Signed)
 Notes faxed.

## 2024-07-29 NOTE — Telephone Encounter (Signed)
 Advised pt spouse OV notes from last visit were faxed to [employer] to ensure he gets his supplies.

## 2024-07-29 NOTE — Telephone Encounter (Signed)
 Patient called about info needed from [employer] about cpap machine. I let him know of message that notes were sent. Can someone really what the notes were? Patient says he spoke with [employer] today and was told they need more info. He states he will call them back to get more info

## 2024-08-03 NOTE — Progress Notes (Unsigned)
 "     Date:  08/04/2024   ID:  Jason Farrell., DOB August 28, 1940, MRN [id number]  Patient Location:  [address] [place] [address]   Provider location:   [facility], [place] office  PCP:  [name], MD  Cardiologist:  [name]  Chief Complaint  Patient presents with   Follow-up    Denies any cardiac symptoms.     History of Present Illness:    Jason Farrell. is a 84 y.o. 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  Last seen in clinic 9/24 Presents today with his wife In follow-up today reports doing well No significant shortness of breath or chest pain  Works in the yard, hobbies, woodworking, fixing things, spends time in his shop Has arthritis Trace ankle swelling Brings blood pressure measurements, running 130 up to 150 systolic  Denies tachycardia concerning for arrhythmia Maintaining normal sinus rhythm today  Lab work reviewed on today's visit A1C 6.4 Total chol 111, LDL 41 CR 1.03  EKG Interpretation Date/Time:  Tuesday August 04 2024 08:55:00 EST Ventricular Rate:  57 PR Interval:  248 QRS Duration:  154 QT Interval:  508 QTC Calculation: 494 R Axis:   48  Text Interpretation: Sinus bradycardia with 1st degree A-V block Right bundle branch block Cannot rule out Inferior infarct (cited on or before 12-Sep-2018) When compared with ECG of 01-Apr-2023 10:43, No significant change was found Confirmed by Perla Lye [phone]) on 08/04/2024 9:26:35 AM   Other past medical history reviewed In the hospital 09/2018 intermittent confusion with difficulty speaking and visual changes  since 06/2018 work-up revealing large meningioma at level of cerebral aqueduct with vasogenic edema posterior medial temporal lobes and obstructive hydrocephalus.    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 Zarmorodi.   Postop course  mental status changes with bouts of lethargy, confusion, problems with sleep-wake disruption with delirium, loss of bilateral vision as well as issues with leukocytosis and SVT. Amiodarone  and B-blocker for SVT   Sister with lung cancer now in remission Reports he stopped smoking 1996  He stopped smoking in 1996 after his MI . Smoked 38 yrs   Past Medical History:  Diagnosis Date   Coronary artery disease    Diabetes mellitus without complication (HCC)    Dyslipidemia    GERD (gastroesophageal reflux disease)    Hyperlipidemia    Hypertension    MI (myocardial infarction) (HCC)    Osteoarthritis    Past Surgical History:  Procedure Laterality Date   CARDIAC CATHETERIZATION  2006   stent placement   COLONOSCOPY     CORONARY ANGIOPLASTY WITH STENT PLACEMENT  2006   stent x 2    CORONARY ANGIOPLASTY WITH STENT PLACEMENT  1996   stent x 3    CRANIOTOMY  08/12/2018   FOOT SURGERY Right    HALLUX VALGUS CHEILECTOMY Right 02/25/2015   Procedure: HALLUX VALGUS CHEILECTOMY;  Surgeon: [name], DPM;  Location: [facility] ORS;  Service: Podiatry;  Laterality: Right;     Current Meds  Medication Sig   acetaminophen  (TYLENOL ) 325 MG tablet Take 1-2 tablets (325-650 mg total) by  mouth every 4 (four) hours as needed for mild pain.   amiodarone  (PACERONE ) 200 MG tablet Take 1 tablet (200 mg total) by mouth daily.   amLODipine  (NORVASC ) 5 MG tablet Take 1 tablet (5 mg total) by mouth every evening.   aspirin  81 MG tablet Take 81 mg by mouth daily.   atorvastatin  (LIPITOR) 40 MG tablet Take 1 tablet (40 mg total) by mouth daily.   Cholecalciferol (VITAMIN D ) 50 MCG (2000 UT) tablet Take 2,000 Units by mouth daily.   docusate  sodium (COLACE) 100 MG capsule Take 100 mg by mouth 2 (two) times daily. Pt takes every other day   doxazosin  (CARDURA ) 1 MG tablet Take 1 tablet (1 mg total) by mouth every evening.   ferrous sulfate  325 (65 FE) MG tablet Take 1 tablet (325 mg total) by mouth 2 (two) times daily with a meal. Add colace 100 mg (Patient taking differently: Take 325 mg by mouth daily with breakfast.)   glipiZIDE  (GLUCOTROL  XL) 2.5 MG 24 hr tablet Take 1 tablet (2.5 mg total) by mouth daily with breakfast.   glucose blood test strip Use as instructed   ipratropium (ATROVENT ) 0.03 % nasal spray Place 2 sprays into both nostrils every 12 (twelve) hours.   magnesium  oxide (MAG-OX) 400 (241.3 Mg) MG tablet Take 1 tablet (400 mg total) by mouth 2 (two) times daily.   metFORMIN  (GLUCOPHAGE -XR) 750 MG 24 hr tablet Take 2 tablets (1,500 mg total) by mouth daily with breakfast.   metoprolol  succinate (TOPROL -XL) 25 MG 24 hr tablet Take 1 tablet (25 mg total) by mouth every evening.   valsartan -hydrochlorothiazide  (DIOVAN -HCT) 320-25 MG tablet Take 1 tablet by mouth daily.     Allergies:   Patient has no known allergies.   Social History   Tobacco Use   Smoking status: Former    Current packs/day: 0.00    Average packs/day: 1.5 packs/day for 35.1 years (52.7 ttl pk-yrs)    Types: Cigarettes    Start date: 07/17/1959    Quit date: 08/27/1994    Years since quitting: 29.9   Smokeless tobacco: Never   Tobacco comments:    smoking cessation materials not required  Vaping Use   Vaping status: Never Used  Substance Use Topics   Alcohol use: Not Currently    Alcohol/week: 0.0 standard drinks of alcohol   Drug use: No     Family Hx: The patient's family history includes Alcohol abuse in his father; COPD in his sister; Cancer in his sister and sister; Heart disease in his sister; Hyperlipidemia in his father and sister; Hypertension in his father and sister.  ROS:   Please see the history of present illness.    Review  of Systems  Constitutional: Negative.   Respiratory: Negative.    Cardiovascular: Negative.   Gastrointestinal: Negative.   Musculoskeletal: Negative.   Neurological: Negative.   Psychiatric/Behavioral: Negative.    All other systems reviewed and are negative.    Labs/Other Tests and Data Reviewed:    Recent Labs: 08/20/2023: ALT 16; BUN 16; Creat 1.03; Hemoglobin 14.4; Platelets 254; Potassium 4.6; Sodium 143   Recent Lipid Panel Lab Results  Component Value Date/Time   CHOL 111 08/20/2023 11:01 AM   CHOL 126 11/07/2015 10:06 AM   TRIG 96 08/20/2023 11:01 AM   HDL 52 08/20/2023 11:01 AM   HDL 47 11/07/2015 10:06 AM   CHOLHDL 2.1 08/20/2023 11:01 AM   LDLCALC 41 08/20/2023 11:01 AM    Wt Readings from  Last 3 Encounters:  08/04/24 208 lb 6.4 oz (94.5 kg)  04/20/24 203 lb 8 oz (92.3 kg)  12/18/23 208 lb 8 oz (94.6 kg)     Exam:    BP (!) 146/62 (BP Location: Left Arm, Patient Position: Sitting, Cuff Size: Normal)   Pulse (!) 57   Ht 5' 6 (1.676 m)   Wt 208 lb 6.4 oz (94.5 kg)   SpO2 97%   BMI 33.64 kg/m  Constitutional:  oriented to person, place, and time. No distress.  HENT:  Head: Normocephalic and atraumatic.  Eyes:  no discharge. No scleral icterus.  Neck: Normal range of motion. Neck supple. No JVD present.  Cardiovascular: Normal rate, regular rhythm, normal heart sounds and intact distal pulses. Exam reveals no gallop and no friction rub.  Trace lower extremity ankle edema No murmur heard. Pulmonary/Chest: Effort normal and breath sounds normal. No stridor. No respiratory distress.  no wheezes.  no rales.  no tenderness.  Abdominal: Soft.  no distension.  no tenderness.  Musculoskeletal: Normal range of motion.  no  tenderness or deformity.  Neurological:  normal muscle tone. Coordination normal. No atrophy Skin: Skin is warm and dry. No rash noted. not diaphoretic.  Psychiatric:  normal mood and affect. behavior is normal. Thought content normal.       ASSESSMENT & PLAN:     Coronary artery disease involving native coronary artery of native heart without angina pectoris - Currently with no symptoms of angina. No further workup at this time. Continue current medication regimen.  Essential hypertension - Blood pressure running high recommend he start Cardura  1 mg twice daily Stay on metoprolol , valsartan  hydrochlorothiazide  Avoid higher dose of amlodipine  given trace leg swelling   Mixed hyperlipidemia Consistently well-controlled lipid numbers on atorvastatin  40 daily No changes to medications made   Controlled type 2 diabetes mellitus without complication, without long-term current use of insulin  (HCC) Hemoglobin A1c mid 6 range, stable Managed by primary care   Class 2 obesity due to excess calories without serious comorbidity in adult, unspecified BMI We have encouraged continued exercise, careful diet management in an effort to lose weight.  Centrilobular emphysema (HCC) 38 years of smoking, quit smoking 1996 Chronic mild shortness of breath on heavy exertion Walking program for conditioning and weight loss recommended   Obstructive sleep apnea on CPAP Stable, on CPAP Weight continues to run high  SVT On amiodarone  and beta-blocker,  Check LFTs and TSH today    Signed, Evalene Lunger, MD  08/04/2024 9:27 AM    [facility] [address], [place], [place] [address] "

## 2024-08-04 ENCOUNTER — Encounter: Payer: Self-pay | Admitting: Cardiovascular Disease

## 2024-08-04 ENCOUNTER — Ambulatory Visit: Attending: Cardiovascular Disease | Admitting: Cardiovascular Disease

## 2024-08-04 VITALS — BP 146/62 | HR 57 | Ht 66.0 in | Wt 208.4 lb

## 2024-08-04 DIAGNOSIS — I471 Supraventricular tachycardia, unspecified: Secondary | ICD-10-CM | POA: Diagnosis not present

## 2024-08-04 DIAGNOSIS — I10 Essential (primary) hypertension: Secondary | ICD-10-CM | POA: Diagnosis not present

## 2024-08-04 DIAGNOSIS — I152 Hypertension secondary to endocrine disorders: Secondary | ICD-10-CM | POA: Diagnosis not present

## 2024-08-04 DIAGNOSIS — R9431 Abnormal electrocardiogram [ECG] [EKG]: Secondary | ICD-10-CM | POA: Insufficient documentation

## 2024-08-04 DIAGNOSIS — Z79899 Other long term (current) drug therapy: Secondary | ICD-10-CM | POA: Diagnosis not present

## 2024-08-04 DIAGNOSIS — E782 Mixed hyperlipidemia: Secondary | ICD-10-CM | POA: Insufficient documentation

## 2024-08-04 DIAGNOSIS — E1159 Type 2 diabetes mellitus with other circulatory complications: Secondary | ICD-10-CM | POA: Diagnosis not present

## 2024-08-04 DIAGNOSIS — I25118 Atherosclerotic heart disease of native coronary artery with other forms of angina pectoris: Secondary | ICD-10-CM | POA: Insufficient documentation

## 2024-08-04 MED ORDER — DOXAZOSIN MESYLATE 1 MG PO TABS: 1.0000 mg | ORAL_TABLET | Freq: Two times a day (BID) | ORAL | 3 refills | Status: AC

## 2024-08-04 MED ORDER — AMIODARONE HCL 200 MG PO TABS: 200.0000 mg | ORAL_TABLET | Freq: Every day | ORAL | 3 refills | Status: AC

## 2024-08-04 NOTE — Patient Instructions (Addendum)
 Medication Instructions:   Please increase cardura /doxazosin  up to 1 mg twice a day  If you need a refill on your cardiac medications before your next appointment, please call your pharmacy.   Lab work:  TSH and CMP today  Testing/Procedures: No new testing needed  Follow-Up: At [facility], you and your health needs are our priority.  As part of our continuing mission to provide you with exceptional heart care, we have created designated Provider Care Teams.  These Care Teams include your primary Cardiologist (physician) and Advanced Practice Providers (APPs -  Physician Assistants and Nurse Practitioners) who all work together to provide you with the care you need, when you need it.  You will need a follow up appointment in 12 months  Providers on your designated Care Team:   Lonni Meager, NP Bernardino Bring, PA-C Cadence [place], [place]  COVID-19 Vaccine Information can be found at: [email or web address] For questions related to vaccine distribution or appointments, please email [email or web address] or call [phone].

## 2024-08-05 LAB — COMPREHENSIVE METABOLIC PANEL WITH GFR
ALT: 11 IU/L (ref 0–44)
AST: 13 IU/L (ref 0–40)
Albumin: 4.3 g/dL (ref 3.7–4.7)
Alkaline Phosphatase: 90 IU/L (ref 48–129)
BUN/Creatinine Ratio: 16 (ref 10–24)
BUN: 16 mg/dL (ref 8–27)
Bilirubin Total: 0.3 mg/dL (ref 0.0–1.2)
CO2: 23 mmol/L (ref 20–29)
Calcium: 9.8 mg/dL (ref 8.6–10.2)
Chloride: 102 mmol/L (ref 96–106)
Creatinine, Ser: 1.03 mg/dL (ref 0.76–1.27)
Globulin, Total: 2.2 g/dL (ref 1.5–4.5)
Glucose: 141 mg/dL — ABNORMAL HIGH (ref 70–99)
Potassium: 4.6 mmol/L (ref 3.5–5.2)
Sodium: 143 mmol/L (ref 134–144)
Total Protein: 6.5 g/dL (ref 6.0–8.5)
eGFR: 73 mL/min/1.73

## 2024-08-05 LAB — TSH: TSH: 1.89 u[IU]/mL (ref 0.450–4.500)

## 2024-08-09 ENCOUNTER — Ambulatory Visit: Payer: Self-pay | Admitting: Cardiovascular Disease

## 2024-08-18 ENCOUNTER — Telehealth: Payer: Self-pay | Admitting: Pharmacy Technician

## 2024-08-18 ENCOUNTER — Other Ambulatory Visit (HOSPITAL_COMMUNITY): Payer: Self-pay

## 2024-08-21 ENCOUNTER — Encounter: Payer: Self-pay | Admitting: Family Medicine

## 2024-08-21 ENCOUNTER — Ambulatory Visit: Admitting: Family Medicine

## 2024-08-21 VITALS — BP 126/70 | HR 60 | Resp 16 | Ht 66.0 in | Wt 210.9 lb

## 2024-08-21 DIAGNOSIS — I471 Supraventricular tachycardia, unspecified: Secondary | ICD-10-CM

## 2024-08-21 DIAGNOSIS — I25118 Atherosclerotic heart disease of native coronary artery with other forms of angina pectoris: Secondary | ICD-10-CM

## 2024-08-21 DIAGNOSIS — G4733 Obstructive sleep apnea (adult) (pediatric): Secondary | ICD-10-CM

## 2024-08-21 DIAGNOSIS — E1169 Type 2 diabetes mellitus with other specified complication: Secondary | ICD-10-CM

## 2024-08-21 DIAGNOSIS — M1712 Unilateral primary osteoarthritis, left knee: Secondary | ICD-10-CM

## 2024-08-21 DIAGNOSIS — E119 Type 2 diabetes mellitus without complications: Secondary | ICD-10-CM | POA: Insufficient documentation

## 2024-08-21 DIAGNOSIS — J410 Simple chronic bronchitis: Secondary | ICD-10-CM

## 2024-08-21 DIAGNOSIS — D329 Benign neoplasm of meninges, unspecified: Secondary | ICD-10-CM

## 2024-08-21 LAB — POCT GLYCOSYLATED HEMOGLOBIN (HGB A1C): Hemoglobin A1C: 6 % — AB (ref 4.0–5.6)

## 2024-08-21 LAB — LIPID PANEL
Cholesterol: 109 mg/dL
HDL: 50 mg/dL
LDL Cholesterol (Calc): 39 mg/dL
Non-HDL Cholesterol (Calc): 59 mg/dL
Total CHOL/HDL Ratio: 2.2 (calc)
Triglycerides: 117 mg/dL

## 2024-08-21 LAB — CBC WITH DIFFERENTIAL/PLATELET
Absolute Lymphocytes: 1303 {cells}/uL (ref 850–3900)
Absolute Monocytes: 606 {cells}/uL (ref 200–950)
Basophils Absolute: 83 {cells}/uL (ref 0–200)
Basophils Relative: 1 %
Eosinophils Absolute: 158 {cells}/uL (ref 15–500)
Eosinophils Relative: 1.9 %
HCT: 42.8 % (ref 39.4–51.1)
Hemoglobin: 14.1 g/dL (ref 13.2–17.1)
MCH: 29.8 pg (ref 27.0–33.0)
MCHC: 32.9 g/dL (ref 31.6–35.4)
MCV: 90.5 fL (ref 81.4–101.7)
MPV: 11.4 fL (ref 7.5–12.5)
Monocytes Relative: 7.3 %
Neutro Abs: 6150 {cells}/uL (ref 1500–7800)
Neutrophils Relative %: 74.1 %
Platelets: 243 10*3/uL (ref 140–400)
RBC: 4.73 Million/uL (ref 4.20–5.80)
RDW: 12.7 % (ref 11.0–15.0)
Total Lymphocyte: 15.7 %
WBC: 8.3 10*3/uL (ref 3.8–10.8)

## 2024-08-21 NOTE — Progress Notes (Signed)
 Name: Jason Farrell.   MRN: [id number]    DOB: 12/11/1940   Date:08/21/2024       Progress Note  Subjective  Chief Complaint  Chief Complaint  Patient presents with   Medical Management of Chronic Issues    Req DM exam from [facility]   Discussed the use of AI scribe software for clinical note transcription with the patient, who gave verbal consent to proceed.  History of Present Illness Jason Farrell. [name] is an 84 year old male with supraventricular tachycardia, coronary artery disease, and type 2 diabetes who presents for a routine follow-up.  He is here for his regular follow-up visit, which occurs every four months. Recent blood work in January included thyroid  function tests due to his use of pacerone  for supraventricular tachycardia. His glucose level was 141 mg/dL, and his kidney and liver functions were normal. He is currently taking metformin  750 mg twice a day and glipizide  2.5 mg daily for his type 2 diabetes. His blood sugar levels at home range between 110 and 120 mg/dL, with no symptoms of hypoglycemia such as shakiness or dizziness.  He has a history of coronary artery disease with atherosclerosis of the aorta and has had stents placed three times after a heart attack in 1996 and two more stents in 2006. He quit smoking after his first heart attack. He is currently taking atorvastatin  40 mg,  metoprolol  25 mg, amlodipine  5 mg, and valsartan  HCTZ 320/25 mg for his heart condition and hypertension. He denies chest pain, tightness, or shortness of breath.  He has   meningioma for which he underwent a craniotomy. He last followed up with neurologist was about 18 months ago and had an MRI that showed some encephalomyelation and gliosis on the occipital lobe, with a small enlargement of the meningioma. He also has a left parotid mass that was biopsied 10-15 years ago and found to be negative.  He reports chronic bronchitis but denies current symptoms such as coughing,  wheezing, or shortness of breath. He has a history of smoking but quit since his MI  He mentions issues with his left knee, described as 'bone on bone,' and received a shot about five to six years ago that provided temporary relief. He is not interested in knee replacement surgery at this time. However states the pain is bothersome and is ready to go back to ortho  He uses a CPAP machine nightly and reports satisfaction with its use. He recently acquired new hearing aids, which have not been functioning properly, and he has an appointment scheduled to address this issue.    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    Status post craniotomy 08/12/2018   Senile purpura 02/21/2018   Type 2 diabetes mellitus with microalbuminuria, without long-term current use of insulin  (HCC) 02/21/2018   Osteoarthritis of hip 11/04/2017   Obstructive sleep apnea on CPAP 12/09/2015   OA (osteoarthritis) of knee 05/06/2015   Gastroesophageal reflux disease without esophagitis 05/06/2015   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 01/25/2015   Hyperlipidemia 08/16/2014   Essential hypertension 08/16/2014   Stopped smoking with greater than 30 pack year history 08/16/2014   COLD (chronic obstructive  lung disease) (HCC) 08/16/2014    Past Surgical History:  Procedure Laterality Date   CARDIAC CATHETERIZATION  2006   stent placement   COLONOSCOPY     CORONARY ANGIOPLASTY WITH STENT PLACEMENT  2006   stent x 2    CORONARY ANGIOPLASTY WITH STENT PLACEMENT  1996   stent x 3    CRANIOTOMY  08/12/2018   FOOT SURGERY Right    HALLUX VALGUS CHEILECTOMY Right 02/25/2015   Procedure:  HALLUX VALGUS CHEILECTOMY;  Surgeon: [name], DPM;  Location: [facility];  Service: Podiatry;  Laterality: Right;    Family History  Problem Relation Age of Onset   Hyperlipidemia Father    Hypertension Father    Alcohol abuse Father    Cancer Sister        lung   Hypertension Sister    Heart disease Sister    Hyperlipidemia Sister    COPD Sister    Cancer Sister        unknown    Social History   Tobacco Use   Smoking status: Former    Current packs/day: 0.00    Average packs/day: 1.5 packs/day for 35.1 years (52.7 ttl pk-yrs)    Types: Cigarettes    Start date: 07/17/1959    Quit date: 08/27/1994    Years since quitting: 30.0   Smokeless tobacco: Never   Tobacco comments:    smoking cessation materials not required  Substance Use Topics   Alcohol use: Not Currently    Alcohol/week: 0.0 standard drinks of alcohol    Current Medications[1]  Allergies[2]  I personally reviewed active problem list, medication list, allergies, family history with the patient/caregiver today.   ROS  Ten systems reviewed and is negative except as mentioned in HPI    Objective Physical Exam  CONSTITUTIONAL: Patient appears well-developed and well-nourished. No distress. HEENT: Head atraumatic, normocephalic, neck supple. CARDIOVASCULAR: Normal rate, regular rhythm and normal heart sounds. No murmur heard. No significant edema in extremities. PULMONARY: Effort normal and breath sounds normal. Lungs clear to auscultation. No respiratory distress. ABDOMINAL: There is no tenderness or distention. MUSCULOSKELETAL: slow gait, crepitus extension left knee PSYCHIATRIC: Patient has a normal mood and affect. Behavior is normal. Judgment and thought content normal.  Vitals:   08/21/24 0955  BP: 126/70  Pulse: 60  Resp: 16  SpO2: 97%  Weight: 210 lb 14.4 oz (95.7 kg)  Height: 5' 6 (1.676 m)    Body mass index is 34.04 kg/m.  Recent Results (from the past 2160 hours)   Comprehensive metabolic panel with GFR     Status: Abnormal   Collection Time: 08/04/24  9:41 AM  Result Value Ref Range   Glucose 141 (H) 70 - 99 mg/dL   BUN 16 8 - 27 mg/dL   Creatinine, Ser 8.96 0.76 - 1.27 mg/dL   eGFR 73 >40 fO/fpw/8.26   BUN/Creatinine Ratio 16 10 - 24   Sodium 143 134 - 144 mmol/L   Potassium 4.6 3.5 - 5.2 mmol/L   Chloride 102 96 - 106 mmol/L   CO2 23 20 - 29 mmol/L   Calcium  9.8 8.6 - 10.2 mg/dL   Total Protein 6.5 6.0 - 8.5 g/dL   Albumin 4.3 3.7 - 4.7 g/dL   Globulin, Total 2.2 1.5 - 4.5 g/dL   Bilirubin Total 0.3 0.0 - 1.2 mg/dL   Alkaline Phosphatase 90 48 - 129 IU/L   AST 13 0 - 40 IU/L   ALT 11 0 - 44 IU/L  TSH     Status: None   Collection Time: 08/04/24  9:41 AM  Result Value Ref Range   TSH 1.890 0.450 - 4.500 uIU/mL     PHQ2/9:    08/21/2024    9:49 AM 04/20/2024   10:11 AM 12/18/2023    9:43 AM 08/20/2023    9:59 AM 05/09/2023   10:21 AM  Depression screen PHQ 2/9  Decreased Interest 0 0 0 0 0  Down, Depressed, Hopeless 0 0 0 0 0  PHQ - 2 Score 0 0 0 0 0  Altered sleeping    0   Tired, decreased energy    0   Change in appetite    0   Feeling bad or failure about yourself     0   Trouble concentrating    0   Moving slowly or fidgety/restless    0   Suicidal thoughts    0   PHQ-9 Score    0    Difficult doing work/chores    Not difficult at all      Data saved with a previous flowsheet row definition    phq 9 is negative  Fall Risk:    08/21/2024    9:49 AM 04/20/2024   10:11 AM 12/18/2023    9:43 AM 08/20/2023    9:59 AM 05/16/2023   10:54 AM  Fall Risk   Falls in the past year? 0 0 1 0 0  Number falls in past yr: 0 0 0 0   Injury with Fall? 0 0  0  0    Risk for fall due to : No Fall Risks No Fall Risks Impaired balance/gait No Fall Risks No Fall Risks  Follow up Falls evaluation completed Falls evaluation completed Falls prevention discussed;Education provided;Falls evaluation completed Falls prevention discussed;Education  provided;Falls evaluation completed Falls prevention discussed     Data saved with a previous flowsheet row definition      Assessment & Plan Type 2 diabetes mellitus with microalbuminuria and dyslipidemia Blood glucose well-controlled. Dyslipidemia managed with atorvastatin . - Ordered lipid panel. - Continue metformin  750 mg oral twice daily. - Continue glipizide  2.5 mg oral daily with breakfast.  Essential hypertension and obesity and DM Blood pressure well-controlled with current regimen. - Continue valsartan -hydrochlorothiazide  320-25 mg oral daily. - Continue metoprolol  succinate 25 mg oral every evening. - Continue amlodipine  5 mg oral every evening.  Atherosclerotic heart disease with prior stent placement No current symptoms. History of stent placements in 1996 and 2006. - Continue aspirin  81 mg oral daily. - Continue atorvastatin  40 mg oral daily.  Supraventricular tachycardia Managed with amiodarone  and beta-blocker therapy. - Continue amiodarone  200 mg oral daily. - Continue metoprolol  succinate 25 mg oral every evening.  Meningioma post-craniotomy with residual tumor Residual tumor with small enlargement. No symptoms warranting intervention. - Ensure follow-up with neurologist in June.  Primary osteoarthritis of left knee Chronic knee pain. Prefers non-surgical options. - Referred to orthopedic specialist for evaluation and potential gel injections.  Obstructive sleep apnea on CPAP - Continue CPAP therapy.  Chronic bronchitis No current symptoms.  General health maintenance Colon cancer screening not required. Recent Cologuard negative. Vaccinations up to date. - No further colon cancer screening needed. - Continue routine health maintenance.        [1]  Current Outpatient Medications:    acetaminophen  (TYLENOL ) 325 MG tablet, Take 1-2 tablets (325-650 mg total) by mouth every 4 (four) hours as needed for mild pain., Disp: , Rfl:  amiodarone   (PACERONE ) 200 MG tablet, Take 1 tablet (200 mg total) by mouth daily., Disp: 90 tablet, Rfl: 3   amLODipine  (NORVASC ) 5 MG tablet, Take 1 tablet (5 mg total) by mouth every evening., Disp: 90 tablet, Rfl: 1   aspirin  81 MG tablet, Take 81 mg by mouth daily., Disp: , Rfl:    atorvastatin  (LIPITOR) 40 MG tablet, Take 1 tablet (40 mg total) by mouth daily., Disp: 90 tablet, Rfl: 1   Cholecalciferol (VITAMIN D ) 50 MCG (2000 UT) tablet, Take 2,000 Units by mouth daily., Disp: , Rfl:    docusate sodium  (COLACE) 100 MG capsule, Take 100 mg by mouth 2 (two) times daily. Pt takes every other day, Disp: , Rfl:    doxazosin  (CARDURA ) 1 MG tablet, Take 1 tablet (1 mg total) by mouth 2 (two) times daily., Disp: 180 tablet, Rfl: 3   ferrous sulfate  325 (65 FE) MG tablet, Take 1 tablet (325 mg total) by mouth 2 (two) times daily with a meal. Add colace 100 mg (Patient taking differently: Take 325 mg by mouth daily with breakfast.), Disp: 180 tablet, Rfl: 1   glipiZIDE  (GLUCOTROL  XL) 2.5 MG 24 hr tablet, Take 1 tablet (2.5 mg total) by mouth daily with breakfast., Disp: 90 tablet, Rfl: 1   glucose blood test strip, Use as instructed, Disp: 100 each, Rfl: 12   ipratropium (ATROVENT ) 0.03 % nasal spray, Place 2 sprays into both nostrils every 12 (twelve) hours., Disp: 90 mL, Rfl: 1   magnesium  oxide (MAG-OX) 400 (241.3 Mg) MG tablet, Take 1 tablet (400 mg total) by mouth 2 (two) times daily., Disp: 60 tablet, Rfl: 0   metFORMIN  (GLUCOPHAGE -XR) 750 MG 24 hr tablet, Take 2 tablets (1,500 mg total) by mouth daily with breakfast., Disp: 180 tablet, Rfl: 1   metoprolol  succinate (TOPROL -XL) 25 MG 24 hr tablet, Take 1 tablet (25 mg total) by mouth every evening., Disp: 90 tablet, Rfl: 1   valsartan -hydrochlorothiazide  (DIOVAN -HCT) 320-25 MG tablet, Take 1 tablet by mouth daily., Disp: 90 tablet, Rfl: 1   KLOR-CON  M20 20 MEQ tablet, Take 20 mEq by mouth daily. (Patient not taking: Reported on 08/21/2024), Disp: , Rfl:     Multiple Vitamins-Minerals (CENTRUM SILVER PO), Take 1 tablet by mouth daily.  (Patient not taking: Reported on 08/21/2024), Disp: , Rfl:  [2] No Known Allergies

## 2024-08-25 ENCOUNTER — Ambulatory Visit

## 2024-12-21 ENCOUNTER — Ambulatory Visit: Admitting: Family Medicine
